# Patient Record
Sex: Male | Born: 1940 | Race: White | Hispanic: No | Marital: Married | Smoking: Former smoker
Health system: Southern US, Community
[De-identification: ages and names within clinical notes are randomized; demographics above are authoritative.]

## PROBLEM LIST (undated history)

## (undated) DIAGNOSIS — I2510 Atherosclerotic heart disease of native coronary artery without angina pectoris: Secondary | ICD-10-CM

## (undated) DIAGNOSIS — N183 Chronic kidney disease, stage 3 (moderate): Secondary | ICD-10-CM

## (undated) DIAGNOSIS — Z9289 Personal history of other medical treatment: Secondary | ICD-10-CM

## (undated) DIAGNOSIS — I700 Atherosclerosis of aorta: Secondary | ICD-10-CM

## (undated) DIAGNOSIS — I739 Peripheral vascular disease, unspecified: Secondary | ICD-10-CM

## (undated) DIAGNOSIS — I5030 Unspecified diastolic (congestive) heart failure: Secondary | ICD-10-CM

## (undated) DIAGNOSIS — K449 Diaphragmatic hernia without obstruction or gangrene: Secondary | ICD-10-CM

## (undated) DIAGNOSIS — Z8719 Personal history of other diseases of the digestive system: Secondary | ICD-10-CM

## (undated) DIAGNOSIS — K219 Gastro-esophageal reflux disease without esophagitis: Secondary | ICD-10-CM

## (undated) DIAGNOSIS — E118 Type 2 diabetes mellitus with unspecified complications: Secondary | ICD-10-CM

## (undated) DIAGNOSIS — E7800 Pure hypercholesterolemia, unspecified: Secondary | ICD-10-CM

## (undated) DIAGNOSIS — E669 Obesity, unspecified: Secondary | ICD-10-CM

## (undated) DIAGNOSIS — I10 Essential (primary) hypertension: Secondary | ICD-10-CM

## (undated) HISTORY — DX: Gastro-esophageal reflux disease without esophagitis: K21.9

## (undated) HISTORY — DX: Unspecified diastolic (congestive) heart failure: I50.30

## (undated) HISTORY — DX: Obesity, unspecified: E66.9

## (undated) HISTORY — DX: Personal history of other medical treatment: Z92.89

## (undated) HISTORY — DX: Personal history of other diseases of the digestive system: Z87.19

## (undated) HISTORY — DX: Atherosclerotic heart disease of native coronary artery without angina pectoris: I25.10

## (undated) HISTORY — DX: Type 2 diabetes mellitus with unspecified complications: E11.8

## (undated) HISTORY — DX: Chronic kidney disease, stage 3 (moderate): N18.3

## (undated) HISTORY — DX: Diaphragmatic hernia without obstruction or gangrene: K44.9

## (undated) HISTORY — DX: Peripheral vascular disease, unspecified: I73.9

## (undated) HISTORY — DX: Atherosclerosis of aorta: I70.0

## (undated) SURGERY — COLONOSCOPY
Anesthesia: Moderate Sedation

---

## 2001-07-30 ENCOUNTER — Emergency Department (HOSPITAL_COMMUNITY): Admission: EM | Admit: 2001-07-30 | Discharge: 2001-07-30 | Payer: Self-pay | Admitting: Emergency Medicine

## 2013-02-22 DIAGNOSIS — Z8719 Personal history of other diseases of the digestive system: Secondary | ICD-10-CM

## 2013-02-22 HISTORY — DX: Personal history of other diseases of the digestive system: Z87.19

## 2013-04-26 ENCOUNTER — Other Ambulatory Visit: Payer: Self-pay | Admitting: Family Medicine

## 2013-04-26 ENCOUNTER — Ambulatory Visit
Admission: RE | Admit: 2013-04-26 | Discharge: 2013-04-26 | Disposition: A | Discharge status: Home / Self Care | Payer: Medicare HMO | Source: Ambulatory Visit | Attending: Family Medicine | Admitting: Family Medicine

## 2013-04-26 DIAGNOSIS — M549 Dorsalgia, unspecified: Secondary | ICD-10-CM

## 2013-12-20 ENCOUNTER — Emergency Department (HOSPITAL_COMMUNITY): Payer: Medicare HMO

## 2013-12-20 ENCOUNTER — Encounter (HOSPITAL_COMMUNITY): Payer: Self-pay | Admitting: Emergency Medicine

## 2013-12-20 ENCOUNTER — Inpatient Hospital Stay (HOSPITAL_COMMUNITY)
Admission: EM | Admit: 2013-12-20 | Discharge: 2013-12-28 | DRG: 377 | Disposition: A | Discharge status: Home / Self Care | Payer: Medicare HMO | Attending: Internal Medicine | Admitting: Internal Medicine

## 2013-12-20 DIAGNOSIS — K264 Chronic or unspecified duodenal ulcer with hemorrhage: Principal | ICD-10-CM | POA: Diagnosis present

## 2013-12-20 DIAGNOSIS — R55 Syncope and collapse: Secondary | ICD-10-CM | POA: Diagnosis present

## 2013-12-20 DIAGNOSIS — R000 Tachycardia, unspecified: Secondary | ICD-10-CM | POA: Diagnosis present

## 2013-12-20 DIAGNOSIS — K625 Hemorrhage of anus and rectum: Secondary | ICD-10-CM

## 2013-12-20 DIAGNOSIS — N183 Chronic kidney disease, stage 3 (moderate): Secondary | ICD-10-CM | POA: Diagnosis present

## 2013-12-20 DIAGNOSIS — N179 Acute kidney failure, unspecified: Secondary | ICD-10-CM | POA: Diagnosis present

## 2013-12-20 DIAGNOSIS — I9589 Other hypotension: Secondary | ICD-10-CM

## 2013-12-20 DIAGNOSIS — I10 Essential (primary) hypertension: Secondary | ICD-10-CM | POA: Diagnosis present

## 2013-12-20 DIAGNOSIS — Z7982 Long term (current) use of aspirin: Secondary | ICD-10-CM | POA: Diagnosis not present

## 2013-12-20 DIAGNOSIS — K208 Other esophagitis: Secondary | ICD-10-CM | POA: Diagnosis present

## 2013-12-20 DIAGNOSIS — E872 Acidosis: Secondary | ICD-10-CM | POA: Diagnosis present

## 2013-12-20 DIAGNOSIS — Z8719 Personal history of other diseases of the digestive system: Secondary | ICD-10-CM | POA: Insufficient documentation

## 2013-12-20 DIAGNOSIS — I129 Hypertensive chronic kidney disease with stage 1 through stage 4 chronic kidney disease, or unspecified chronic kidney disease: Secondary | ICD-10-CM | POA: Diagnosis present

## 2013-12-20 DIAGNOSIS — K5730 Diverticulosis of large intestine without perforation or abscess without bleeding: Secondary | ICD-10-CM | POA: Diagnosis present

## 2013-12-20 DIAGNOSIS — R58 Hemorrhage, not elsewhere classified: Secondary | ICD-10-CM

## 2013-12-20 DIAGNOSIS — D62 Acute posthemorrhagic anemia: Secondary | ICD-10-CM | POA: Diagnosis present

## 2013-12-20 DIAGNOSIS — K922 Gastrointestinal hemorrhage, unspecified: Secondary | ICD-10-CM

## 2013-12-20 DIAGNOSIS — E785 Hyperlipidemia, unspecified: Secondary | ICD-10-CM | POA: Diagnosis present

## 2013-12-20 DIAGNOSIS — E780 Pure hypercholesterolemia: Secondary | ICD-10-CM | POA: Diagnosis present

## 2013-12-20 DIAGNOSIS — M109 Gout, unspecified: Secondary | ICD-10-CM | POA: Diagnosis present

## 2013-12-20 DIAGNOSIS — R571 Hypovolemic shock: Secondary | ICD-10-CM | POA: Diagnosis present

## 2013-12-20 DIAGNOSIS — N289 Disorder of kidney and ureter, unspecified: Secondary | ICD-10-CM

## 2013-12-20 DIAGNOSIS — E875 Hyperkalemia: Secondary | ICD-10-CM | POA: Diagnosis present

## 2013-12-20 DIAGNOSIS — J449 Chronic obstructive pulmonary disease, unspecified: Secondary | ICD-10-CM | POA: Diagnosis present

## 2013-12-20 DIAGNOSIS — K209 Esophagitis, unspecified: Secondary | ICD-10-CM | POA: Diagnosis present

## 2013-12-20 DIAGNOSIS — Z452 Encounter for adjustment and management of vascular access device: Secondary | ICD-10-CM

## 2013-12-20 HISTORY — DX: Essential (primary) hypertension: I10

## 2013-12-20 HISTORY — DX: Pure hypercholesterolemia, unspecified: E78.00

## 2013-12-20 LAB — I-STAT TROPONIN, ED: TROPONIN I, POC: 0.03 ng/mL (ref 0.00–0.08)

## 2013-12-20 LAB — COMPREHENSIVE METABOLIC PANEL
ALK PHOS: 34 U/L — AB (ref 39–117)
ALT: 21 U/L (ref 0–53)
ANION GAP: 20 — AB (ref 5–15)
AST: 24 U/L (ref 0–37)
Albumin: 2.8 g/dL — ABNORMAL LOW (ref 3.5–5.2)
BILIRUBIN TOTAL: 0.2 mg/dL — AB (ref 0.3–1.2)
BUN: 54 mg/dL — ABNORMAL HIGH (ref 6–23)
CHLORIDE: 103 meq/L (ref 96–112)
CO2: 16 mEq/L — ABNORMAL LOW (ref 19–32)
CREATININE: 2.36 mg/dL — AB (ref 0.50–1.35)
Calcium: 8.7 mg/dL (ref 8.4–10.5)
GFR calc Af Amer: 30 mL/min — ABNORMAL LOW (ref >90)
GFR calc non Af Amer: 26 mL/min — ABNORMAL LOW (ref >90)
Glucose, Bld: 157 mg/dL — ABNORMAL HIGH (ref 70–99)
POTASSIUM: 4.5 meq/L (ref 3.7–5.3)
Sodium: 139 mEq/L (ref 137–147)
Total Protein: 5.4 g/dL — ABNORMAL LOW (ref 6.0–8.3)

## 2013-12-20 LAB — POC OCCULT BLOOD, ED: Fecal Occult Bld: POSITIVE — AB

## 2013-12-20 LAB — CBC WITH DIFFERENTIAL/PLATELET
BASOS PCT: 0 % (ref 0–1)
Basophils Absolute: 0 10*3/uL (ref 0.0–0.1)
Eosinophils Absolute: 0 10*3/uL (ref 0.0–0.7)
Eosinophils Relative: 0 % (ref 0–5)
HCT: 30.7 % — ABNORMAL LOW (ref 39.0–52.0)
Hemoglobin: 10.3 g/dL — ABNORMAL LOW (ref 13.0–17.0)
LYMPHS ABS: 1 10*3/uL (ref 0.7–4.0)
Lymphocytes Relative: 13 % (ref 12–46)
MCH: 31.9 pg (ref 26.0–34.0)
MCHC: 33.6 g/dL (ref 30.0–36.0)
MCV: 95 fL (ref 78.0–100.0)
MONO ABS: 0.4 10*3/uL (ref 0.1–1.0)
MONOS PCT: 6 % (ref 3–12)
Neutro Abs: 6 10*3/uL (ref 1.7–7.7)
Neutrophils Relative %: 81 % — ABNORMAL HIGH (ref 43–77)
Platelets: 192 10*3/uL (ref 150–400)
RBC: 3.23 MIL/uL — ABNORMAL LOW (ref 4.22–5.81)
RDW: 14 % (ref 11.5–15.5)
WBC: 7.4 10*3/uL (ref 4.0–10.5)

## 2013-12-20 LAB — PREPARE RBC (CROSSMATCH)

## 2013-12-20 LAB — ABO/RH: ABO/RH(D): O NEG

## 2013-12-20 LAB — I-STAT CG4 LACTIC ACID, ED: Lactic Acid, Venous: 6.51 mmol/L — ABNORMAL HIGH (ref 0.5–2.2)

## 2013-12-20 LAB — TROPONIN I: Troponin I: <0.3 ng/mL (ref <0.30)

## 2013-12-20 MED ORDER — ONDANSETRON HCL 4 MG/2ML IJ SOLN
INTRAMUSCULAR | Status: AC
  Administered 2013-12-20: 4 mg via INTRAVENOUS
  Filled 2013-12-20: qty 2

## 2013-12-20 MED ORDER — SODIUM CHLORIDE 0.9 % IV SOLN: 250.0000 mL | INTRAVENOUS | Status: DC | PRN

## 2013-12-20 MED ORDER — ONDANSETRON HCL 4 MG/2ML IJ SOLN
4.0000 mg | Freq: Once | INTRAMUSCULAR | Status: AC
  Administered 2013-12-20: 4 mg via INTRAVENOUS

## 2013-12-20 MED ORDER — ONDANSETRON HCL 4 MG/2ML IJ SOLN
4.0000 mg | Freq: Four times a day (QID) | INTRAMUSCULAR | Status: DC | PRN
  Administered 2013-12-21: 4 mg via INTRAVENOUS
  Filled 2013-12-20: qty 2

## 2013-12-20 MED ORDER — SODIUM CHLORIDE 0.9 % IV SOLN
INTRAVENOUS | Status: DC
  Administered 2013-12-20: 100 mL/h via INTRAVENOUS

## 2013-12-20 MED ORDER — PANTOPRAZOLE SODIUM 40 MG IV SOLR
40.0000 mg | Freq: Every day | INTRAVENOUS | Status: DC
  Administered 2013-12-20: 40 mg via INTRAVENOUS
  Filled 2013-12-20 (×2): qty 40

## 2013-12-20 MED ORDER — SODIUM CHLORIDE 0.9 % IV BOLUS (SEPSIS)
1000.0000 mL | Freq: Once | INTRAVENOUS | Status: AC
  Administered 2013-12-20: 1000 mL via INTRAVENOUS

## 2013-12-20 MED ORDER — SODIUM CHLORIDE 0.9 % IV SOLN
Freq: Once | INTRAVENOUS | Status: AC
  Administered 2013-12-20: 23:00:00 via INTRAVENOUS

## 2013-12-20 NOTE — ED Notes (Signed)
Report received from Scott, RN

## 2013-12-20 NOTE — ED Notes (Signed)
Lab reports they do not have a blue top on the patient for coag's for the patient, Rahul, PA was made aware to be drawn after blood transfusion

## 2013-12-20 NOTE — ED Notes (Signed)
Patient states bloody stools x 3 today, patient diaphoretic and pale with sbp in 70's upon EMS arrival, patient received 500 ml LR per EMS, patient now dry and SBP in 110's.

## 2013-12-20 NOTE — ED Provider Notes (Signed)
CSN: [id number]     Arrival date & time 12/20/13  2101 History   First MD Initiated Contact with Patient 12/20/13 2104     Chief Complaint  Patient presents with  . Rectal Bleeding    Patient is a 73 y.o. male presenting with hematochezia. The history is provided by the patient, the EMS personnel and a relative.  Rectal Bleeding Quality:  Bright red Amount:  Copious Duration:  3 hours Timing:  Intermittent Progression:  Waxing and waning Chronicity:  New Context: spontaneously   Context: not constipation, not defecation, not diarrhea and not rectal pain   Similar prior episodes: no (no prior episodes)   Relieved by:  None tried Worsened by:  Nothing tried Ineffective treatments:  None tried Associated symptoms: abdominal pain, light-headedness, loss of consciousness and vomiting   Associated symptoms: no fever, no hematemesis and no recent illness   Abdominal pain:    Location:  Generalized   Quality:  Cramping   Severity:  Mild   Onset quality:  Gradual   Timing:  Intermittent   Progression:  Unchanged   Chronicity:  New Vomiting:    Quality:  Bright red blood   Number of occurrences:  3   Severity:  Severe   Duration:  3 hours   Timing:  Intermittent   Progression:  Partially resolved Risk factors: no anticoagulant use, no hx of colorectal cancer, no hx of colorectal surgery and no liver disease     Past Medical History  Diagnosis Date  . Hypertension   . Hypercholesteremia     History reviewed. No pertinent past surgical history.  No family history on file.  History  Substance Use Topics  . Smoking status: Never Smoker   . Smokeless tobacco: Not on file  . Alcohol Use: No    Review of Systems  Constitutional: Negative for fever.  Respiratory: Negative for shortness of breath.   Cardiovascular: Negative for chest pain.  Gastrointestinal: Positive for vomiting, abdominal pain and hematochezia. Negative for hematemesis.  Neurological: Positive for loss  of consciousness, syncope and light-headedness.  All other systems reviewed and are negative.   Allergies  Review of patient's allergies indicates not on file.  Home Medications   Prior to Admission medications   Medication Sig Start Date End Date Taking? Authorizing Provider  allopurinol (ZYLOPRIM) 100 MG tablet Take 200 mg by mouth every morning.   Yes Historical Provider, MD  aspirin EC 325 MG tablet Take 325 mg by mouth every evening.   Yes Historical Provider, MD  ezetimibe (ZETIA) 10 MG tablet Take 10 mg by mouth every evening.   Yes Historical Provider, MD  fenofibrate (TRICOR) 145 MG tablet Take 145 mg by mouth every evening.   Yes Historical Provider, MD  indomethacin (INDOCIN) 50 MG capsule Take 50 mg by mouth daily as needed (gout).   Yes Historical Provider, MD  losartan (COZAAR) 50 MG tablet Take 50 mg by mouth every morning.   Yes Historical Provider, MD  Multiple Vitamin (ONE-A-DAY MENS PO) Take 1 tablet by mouth every morning.   Yes Historical Provider, MD  niacin (NIASPAN) 1000 MG CR tablet Take 1,000 mg by mouth at bedtime.   Yes Historical Provider, MD  sulfamethoxazole-trimethoprim (BACTRIM DS) 800-160 MG per tablet Take 1 tablet by mouth 2 (two) times daily.    Historical Provider, MD    Vital Signs - Temp: 97.6 F (36.4 C) ; Temp Source: Oral ; ECG Heart Rate: 109 ; Resp: 22 ; BP: 110/69  mmHg ; BP Location: Right Arm ; BP Method: Automatic ; Patient Position (if appropriate): Lying  Oxygen Therapy - SpO2: 98 % ; O2 Device: Room Air  Height and Weight - Height: 5\' 9"  (175.3 cm) ; Height Method: Stated ; Weight: 221 lb (100.245 kg)  Physical Exam  Vitals reviewed. Constitutional: He is oriented to person, place, and time. He appears well-developed and well-nourished. No distress.  HENT:  Head: Normocephalic and atraumatic.  Right Ear: External ear normal.  Left Ear: External ear normal.  Eyes: EOM are normal. Pupils are equal, round, and reactive to light.   Neck: Normal range of motion.  Cardiovascular: Regular rhythm.  Tachycardia present.   Pulses:      Radial pulses are 2+ on the right side, and 2+ on the left side.  Pulmonary/Chest: Effort normal and breath sounds normal. No respiratory distress. He has no wheezes. He has no rales.  Abdominal: Soft. He exhibits no distension. There is no tenderness. There is no rebound and no guarding.  Neurological: He is alert and oriented to person, place, and time.  Skin: Skin is warm and dry. No rash noted. He is not diaphoretic. There is pallor.  Psychiatric: He has a normal mood and affect.    ED Course  Procedures  Labs Review  Results for orders placed during the hospital encounter of 12/20/13  CBC WITH DIFFERENTIAL      Result Value Ref Range   WBC 7.4  4.0 - 10.5 K/uL   RBC 3.23 (*) 4.22 - 5.81 MIL/uL   Hemoglobin 10.3 (*) 13.0 - 17.0 g/dL   HCT 30.830.7 (*) 65.739.0 - 84.652.0 %   MCV 95.0  78.0 - 100.0 fL   MCH 31.9  26.0 - 34.0 pg   MCHC 33.6  30.0 - 36.0 g/dL   RDW 96.214.0  95.211.5 - 84.115.5 %   Platelets 192  150 - 400 K/uL   Neutrophils Relative % 81 (*) 43 - 77 %   Neutro Abs 6.0  1.7 - 7.7 K/uL   Lymphocytes Relative 13  12 - 46 %   Lymphs Abs 1.0  0.7 - 4.0 K/uL   Monocytes Relative 6  3 - 12 %   Monocytes Absolute 0.4  0.1 - 1.0 K/uL   Eosinophils Relative 0  0 - 5 %   Eosinophils Absolute 0.0  0.0 - 0.7 K/uL   Basophils Relative 0  0 - 1 %   Basophils Absolute 0.0  0.0 - 0.1 K/uL  TROPONIN I      Result Value Ref Range   Troponin I <0.30  <0.30 ng/mL  COMPREHENSIVE METABOLIC PANEL      Result Value Ref Range   Sodium 139  137 - 147 mEq/L   Potassium 4.5  3.7 - 5.3 mEq/L   Chloride 103  96 - 112 mEq/L   CO2 16 (*) 19 - 32 mEq/L   Glucose, Bld 157 (*) 70 - 99 mg/dL   BUN 54 (*) 6 - 23 mg/dL   Creatinine, Ser 3.242.36 (*) 0.50 - 1.35 mg/dL   Calcium 8.7  8.4 - 40.110.5 mg/dL   Total Protein 5.4 (*) 6.0 - 8.3 g/dL   Albumin 2.8 (*) 3.5 - 5.2 g/dL   AST 24  0 - 37 U/L   ALT 21  0 - 53 U/L    Alkaline Phosphatase 34 (*) 39 - 117 U/L   Total Bilirubin 0.2 (*) 0.3 - 1.2 mg/dL   GFR calc non Af  Amer 26 (*) >90 mL/min   GFR calc Af Amer 30 (*) >90 mL/min   Anion gap 20 (*) 5 - 15  TROPONIN I      Result Value Ref Range   Troponin I <0.30  <0.30 ng/mL  POC OCCULT BLOOD, ED      Result Value Ref Range   Fecal Occult Bld POSITIVE (*) NEGATIVE  I-STAT CG4 LACTIC ACID, ED      Result Value Ref Range   Lactic Acid, Venous 6.51 (*) 0.5 - 2.2 mmol/L  I-STAT TROPOININ, ED      Result Value Ref Range   Troponin i, poc 0.03  0.00 - 0.08 ng/mL   Comment 3           TYPE AND SCREEN      Result Value Ref Range   ABO/RH(D) O NEG     Antibody Screen NEG     Sample Expiration 12/23/2013     Unit Number [id number]     Blood Component Type RBC LR PHER1     Unit division 00     Status of Unit ISSUED     Transfusion Status OK TO TRANSFUSE     Crossmatch Result Compatible     Unit Number [id number]     Blood Component Type RED CELLS,LR     Unit division 00     Status of Unit ALLOCATED     Transfusion Status OK TO TRANSFUSE     Crossmatch Result Compatible    PREPARE RBC (CROSSMATCH)      Result Value Ref Range   Order Confirmation ORDER PROCESSED BY BLOOD BANK    ABO/RH      Result Value Ref Range   ABO/RH(D) O NEG     Imaging Review Dg Chest Portable 1 View  12/21/2013   CLINICAL DATA:  Syncope.  EXAM: PORTABLE CHEST - 1 VIEW  COMPARISON:  None.  FINDINGS: Study is degraded by multiple overlapping wires and exclusion of the lateral left base.  No definitive cardiomegaly. Negative aortic contours. There is no edema, consolidation, effusion, or pneumothorax.  IMPRESSION: No evidence of acute disease.   Electronically Signed   By: Tiburcio Pea M.D.   On: 12/21/2013 00:31     MDM   Final diagnoses:  BRBPR (bright red blood per rectum)  Syncope and collapse  Syncope    73 y.o. male with a history of HTN, HLD, Gout presents due to BRBPR x 3 today beginning at 5pm.  Started feeling nauseated around 1pm. Had a syncopal episode after moving his bowels. Has had emesis, however does not feel there is blood in his emesis. Feels weak and lightheaded. Tachycardic. Maroon stool on digital rectal exam.   2 large IVs obtained. Concern for profound GI bleed. Type and screen sent. Labs obtained. Hgb 10.3. Ordered 2U PRBCs for him.   ICU contacted for admission given his elevated lactate, AKI, concern for continued bleeding, potential for decompensation. He had no episodes of BRBPR in the ED.   His hemodynamics remained acceptable in the ED - he was transported to the ICU.   This case managed in conjunction with my attending, Dr. Patria Mane.    Maxine Glenn, MD 12/21/13 [phone]

## 2013-12-20 NOTE — ED Notes (Signed)
Pt remains alert, NAD, calm, interactive, resps e/u, speaking in clear complete sentences, [facility], BP remains low, will continue to monitor, HR stable/ improving, HR 102, (denies: pain, sob, dizziness or light headedness).

## 2013-12-20 NOTE — H&P (Signed)
PULMONARY / CRITICAL CARE MEDICINE   Name: Drew [name] MRN: [id number] DOB: 07-09-1940    ADMISSION DATE:  12/20/2013 CONSULTATION DATE:  12/20/2013  REFERRING MD :  EDP  CHIEF COMPLAINT:  Lower GI Bleed  INITIAL PRESENTATION: 73 y.o. M [facility] after he had 3 episodes of hematochezia starting around 4pm same day.  In ED, pt had frank blood per rectum per EDP and pt was hypotensive with SBP in 70's - 80's.  PCCM consulted for admission.   STUDIES:  None  SIGNIFICANT EVENTS: 10/29 - admitted with LGIB   HISTORY OF PRESENT ILLNESS:  Drew [name] is a 73 y.o. M with PMH of HTN and HLD, who [facility] after he had 3 episodes of hematochezia at his home.  He apparently went to have a BM around 4pm earlier that afternoon and noticed that his stool had blood in it.  He initially thought that it was just red from the grape-cranberry juice that he drinks daily; however, he had another BM shortly thereafter which had more blood in it.  He notified his wife and his wife states he later had a 3rd BM that was pretty much all blood. EMS was dispatched and upon their arrival, SBP was in 70's and pt was pale and diaphoretic.  They administered 500cc bolus en route.  In ED, pt remained hypotensive.  Hgb was 10.3, pt was tachycardic and hypotensive with SBP in 70's - 80's still.  1 unit PRBC's was ordered and PCCM was consulted for admission.  Pt currently denies any chest pain, SOB, N/V, abdominal pain.  He denies any similar episodes in the past.  No history of melena, hematochezia, hematemesis.  He does not use NSAID's and is not on any anticoagulation. He does take 325mg  Aspirin daily.  Last colonoscopy was reportedly 2 years ago and per pt, was normal.    PAST MEDICAL HISTORY :   has a past medical history of Hypertension and Hypercholesteremia.  has no past surgical history on file. Prior to Admission medications   Medication Sig Start Date End Date Taking?  Authorizing Provider  allopurinol (ZYLOPRIM) 100 MG tablet Take 200 mg by mouth every morning.   Yes Historical Provider, MD  aspirin EC 325 MG tablet Take 325 mg by mouth every evening.   Yes Historical Provider, MD  ezetimibe (ZETIA) 10 MG tablet Take 10 mg by mouth every evening.   Yes Historical Provider, MD  fenofibrate (TRICOR) 145 MG tablet Take 145 mg by mouth every evening.   Yes Historical Provider, MD  indomethacin (INDOCIN) 50 MG capsule Take 50 mg by mouth daily as needed (gout).   Yes Historical Provider, MD  losartan (COZAAR) 50 MG tablet Take 50 mg by mouth every morning.   Yes Historical Provider, MD  Multiple Vitamin (ONE-A-DAY MENS PO) Take 1 tablet by mouth every morning.   Yes Historical Provider, MD  niacin (NIASPAN) 1000 MG CR tablet Take 1,000 mg by mouth at bedtime.   Yes Historical Provider, MD  sulfamethoxazole-trimethoprim (BACTRIM DS) 800-160 MG per tablet Take 1 tablet by mouth 2 (two) times daily.    Historical Provider, MD   No Known Allergies  FAMILY HISTORY:  No family history on file.  SOCIAL HISTORY:  reports that he has never smoked. He does not have any smokeless tobacco history on file. He reports that he does not drink alcohol or use illicit drugs.  REVIEW OF SYSTEMS:  All negative; except for those that are bolded, which indicate positives.  Constitutional: weight loss, weight gain, night sweats, fevers, chills, fatigue, weakness, diaphoresis. HEENT: headaches, sore throat, sneezing, nasal congestion, post nasal drip, difficulty swallowing, tooth/dental problems, visual complaints, visual changes, ear aches. Neuro: difficulty with speech, weakness, numbness, ataxia. CV:  chest pain, orthopnea, PND, swelling in lower extremities, dizziness, palpitations, syncope.  Resp: cough, hemoptysis, dyspnea, wheezing. GI  heartburn, indigestion, abdominal pain, nausea, vomiting, diarrhea, constipation, change in bowel habits, loss of appetite, hematemesis,  melena, hematochezia.  GU: dysuria, change in color of urine, urgency or frequency, flank pain, hematuria. MSK: joint pain or swelling, decreased range of motion. Psych: change in mood or affect, depression, anxiety, suicidal ideations, homicidal ideations. Skin: rash, itching, bruising.   SUBJECTIVE:  Uncomfortable, c/o dyspnea  VITAL SIGNS: Temp:  [97.6 F (36.4 C)-99.2 F (37.3 C)] 98.9 F (37.2 C) (10/29 2300) Pulse Rate:  [98-116] 105 (10/29 2334) Resp:  [15-24] 18 (10/29 2334) BP: (80-110)/(52-76) 80/53 mmHg (10/29 2334) SpO2:  [95 %-100 %] 96 % (10/29 2334) Weight:  [100.245 kg (221 lb)] 100.245 kg (221 lb) (10/29 2106) HEMODYNAMICS:   VENTILATOR SETTINGS:   INTAKE / OUTPUT: Intake/Output   None     PHYSICAL EXAMINATION: General: WDWN male, pale in color, in NAD. Neuro: A&O x 3, non-focal.  HEENT: [place]/AT. PERRL, sclerae anicteric. Cardiovascular: Tachy but regular, 2/6 SEM.  Lungs: Respirations even and unlabored.  CTA bilaterally, No W/R/R. Abdomen: BS x 4, soft, NT/ND.  Musculoskeletal: No gross deformities, no edema.  Skin: Intact, warm, no rashes.  LABS:  CBC  Recent Labs Lab 12/20/13 2105  WBC 7.4  HGB 10.3*  HCT 30.7*  PLT 192   Coag's No results found for this basename: APTT, INR,  in the last 168 hours BMET  Recent Labs Lab 12/20/13 2222  NA 139  K 4.5  CL 103  CO2 16*  BUN 54*  CREATININE 2.36*  GLUCOSE 157*   Electrolytes  Recent Labs Lab 12/20/13 2222  CALCIUM 8.7   Sepsis Markers  Recent Labs Lab 12/20/13 2139  LATICACIDVEN 6.51*   ABG No results found for this basename: PHART, PCO2ART, PO2ART,  in the last 168 hours Liver Enzymes  Recent Labs Lab 12/20/13 2222  AST 24  ALT 21  ALKPHOS 34*  BILITOT 0.2*  ALBUMIN 2.8*   Cardiac Enzymes  Recent Labs Lab 12/20/13 2105 12/20/13 2222  TROPONINI <0.30 <0.30   Glucose No results found for this basename: GLUCAP,  in the last 168 hours  Imaging No results  found.   ASSESSMENT / PLAN:  HEMATOLOGIC A:   Anemia - from acute blood loss secondary to LGIB, s/p 1 unit PRBC's in ED VTE Prophylaxis P:  H/H q6hrs x 3. Goal Hgb > 10 while actively bleeding. CVC in place Check coags. SCD's only.  GASTROINTESTINAL A:   LGIB GI prophylaxis Nutrition P:   Monitor for further episodes of hematochezia / melena. SUP: Pantoprazole. NPO  Appreciate Dr Marlane HatcherMagod's assistance. Planning for tagged red cell scan this am to localize bleeding source.   PULMONARY A: If LGIB persists and pt requires multiple transfusions, then at risk pulmonary edema / TRALI P:   Pulmonary hygiene. Follow CXR intermittently.  CARDIOVASCULAR A:  Hx HTN, HLD At risk demand ischemia P:  Monitor hemodynamics. Goal MAP > 65. Trend troponin / lactate. Hold outpatient aspirin, zetia, fenofibrate, losartan, niacin.  RENAL A:   AG metabolic acidosis - lactate AKI (SCr 2.36) - unknown  baseline SCr, suspect pre-renal secondary to hypovolemia from LGIB P:   NS @ 100. BMP in AM.  INFECTIOUS A:   No evidence of infection P:   Monitor clinically.  ENDOCRINE A:   No known issues   P:   Monitor glucose on BMP.  NEUROLOGIC A:   No acute issues P:   No interventions required.   Family updated: Wife and son-in law at bedside.  Interdisciplinary Family Meeting v Palliative Care Meeting:  Due by: 11/6   TODAY'S SUMMARY: 73 y.o. Admitted with LGIB.  S/p 1 unit PRBC in ED.  Q6hr H/H, transfuse for Hgb < 10 with active bleed.  Consult GI in AM.  Rutherford Guys, PA - [facility] Pgr: [phone]  or [phone] 12/20/2013, 11:40 PM  CC time: 45 minutes.  Attending Note:  I have examined pt and reviewed labs, notes, studies. I agree with the note above as amended. On my exam he is pale, uncomfortable, tachycardic but otherwise stable. He c/o dyspnea with activity and when he coughs. Hgb has dropped to 8.5, receiving  PRBC now. Plan is for localizing tagged RBC scan this am. Will follow and review plans with GI. He has not voided - will defer foley for now and follow. Independent CC time 45 minutes  Levy Pupa, MD, PhD 12/21/2013, 5:50 AM [facility] [phone] or if no answer [phone]

## 2013-12-20 NOTE — ED Notes (Signed)
Patient with grossly bloody stool per MD during rectal exam

## 2013-12-21 ENCOUNTER — Inpatient Hospital Stay (HOSPITAL_COMMUNITY): Payer: Medicare HMO

## 2013-12-21 ENCOUNTER — Encounter (HOSPITAL_COMMUNITY)
Admission: EM | Disposition: A | Discharge status: Home / Self Care | Payer: Medicare HMO | Source: Home / Self Care | Attending: Internal Medicine

## 2013-12-21 ENCOUNTER — Encounter (HOSPITAL_COMMUNITY): Payer: Self-pay | Admitting: *Deleted

## 2013-12-21 ENCOUNTER — Encounter (HOSPITAL_COMMUNITY)
Admission: EM | Disposition: A | Discharge status: Home / Self Care | Payer: Self-pay | Source: Home / Self Care | Attending: Internal Medicine

## 2013-12-21 DIAGNOSIS — D62 Acute posthemorrhagic anemia: Secondary | ICD-10-CM

## 2013-12-21 DIAGNOSIS — Z452 Encounter for adjustment and management of vascular access device: Secondary | ICD-10-CM

## 2013-12-21 DIAGNOSIS — K922 Gastrointestinal hemorrhage, unspecified: Secondary | ICD-10-CM

## 2013-12-21 DIAGNOSIS — I9589 Other hypotension: Secondary | ICD-10-CM

## 2013-12-21 HISTORY — PX: ENTEROSCOPY: SHX5533

## 2013-12-21 LAB — CBC
HEMATOCRIT: 28.9 % — AB (ref 39.0–52.0)
HEMATOCRIT: 24.5 % — AB (ref 39.0–52.0)
HEMOGLOBIN: 9.8 g/dL — AB (ref 13.0–17.0)
Hemoglobin: 8.5 g/dL — ABNORMAL LOW (ref 13.0–17.0)
MCH: 31.8 pg (ref 26.0–34.0)
MCH: 31.8 pg (ref 26.0–34.0)
MCHC: 33.9 g/dL (ref 30.0–36.0)
MCHC: 34.7 g/dL (ref 30.0–36.0)
MCV: 93.8 fL (ref 78.0–100.0)
MCV: 91.8 fL (ref 78.0–100.0)
PLATELETS: 171 10*3/uL (ref 150–400)
Platelets: 159 10*3/uL (ref 150–400)
RBC: 3.08 MIL/uL — ABNORMAL LOW (ref 4.22–5.81)
RBC: 2.67 MIL/uL — ABNORMAL LOW (ref 4.22–5.81)
RDW: 14 % (ref 11.5–15.5)
RDW: 14.1 % (ref 11.5–15.5)
WBC: 7.1 10*3/uL (ref 4.0–10.5)
WBC: 9.4 10*3/uL (ref 4.0–10.5)

## 2013-12-21 LAB — GLUCOSE, CAPILLARY
GLUCOSE-CAPILLARY: 163 mg/dL — AB (ref 70–99)
GLUCOSE-CAPILLARY: 176 mg/dL — AB (ref 70–99)
GLUCOSE-CAPILLARY: 163 mg/dL — AB (ref 70–99)
Glucose-Capillary: 148 mg/dL — ABNORMAL HIGH (ref 70–99)

## 2013-12-21 LAB — DIFFERENTIAL
Basophils Absolute: 0 10*3/uL (ref 0.0–0.1)
Basophils Relative: 0 % (ref 0–1)
EOS ABS: 0 10*3/uL (ref 0.0–0.7)
EOS PCT: 0 % (ref 0–5)
LYMPHS ABS: 1.5 10*3/uL (ref 0.7–4.0)
Lymphocytes Relative: 16 % (ref 12–46)
MONO ABS: 0.5 10*3/uL (ref 0.1–1.0)
Monocytes Relative: 5 % (ref 3–12)
Neutro Abs: 7.3 10*3/uL (ref 1.7–7.7)
Neutrophils Relative %: 79 % — ABNORMAL HIGH (ref 43–77)

## 2013-12-21 LAB — MRSA PCR SCREENING: MRSA by PCR: NEGATIVE

## 2013-12-21 LAB — BASIC METABOLIC PANEL
ANION GAP: 12 (ref 5–15)
Anion gap: 19 — ABNORMAL HIGH (ref 5–15)
Anion gap: 13 (ref 5–15)
BUN: 57 mg/dL — ABNORMAL HIGH (ref 6–23)
BUN: 67 mg/dL — ABNORMAL HIGH (ref 6–23)
BUN: 64 mg/dL — AB (ref 6–23)
CALCIUM: 8.3 mg/dL — AB (ref 8.4–10.5)
CO2: 14 meq/L — AB (ref 19–32)
CO2: 17 meq/L — AB (ref 19–32)
CO2: 16 meq/L — AB (ref 19–32)
CREATININE: 2.52 mg/dL — AB (ref 0.50–1.35)
CREATININE: 2.45 mg/dL — AB (ref 0.50–1.35)
Calcium: 8.3 mg/dL — ABNORMAL LOW (ref 8.4–10.5)
Calcium: 7.9 mg/dL — ABNORMAL LOW (ref 8.4–10.5)
Chloride: 102 mEq/L (ref 96–112)
Chloride: 109 mEq/L (ref 96–112)
Chloride: 111 mEq/L (ref 96–112)
Creatinine, Ser: 2.37 mg/dL — ABNORMAL HIGH (ref 0.50–1.35)
GFR calc Af Amer: 27 mL/min — ABNORMAL LOW (ref >90)
GFR calc Af Amer: 28 mL/min — ABNORMAL LOW (ref >90)
GFR calc Af Amer: 29 mL/min — ABNORMAL LOW (ref >90)
GFR calc non Af Amer: 25 mL/min — ABNORMAL LOW (ref >90)
GFR, EST NON AFRICAN AMERICAN: 24 mL/min — AB (ref >90)
GFR, EST NON AFRICAN AMERICAN: 24 mL/min — AB (ref >90)
GLUCOSE: 162 mg/dL — AB (ref 70–99)
GLUCOSE: 164 mg/dL — AB (ref 70–99)
Glucose, Bld: 162 mg/dL — ABNORMAL HIGH (ref 70–99)
Potassium: 5.3 mEq/L (ref 3.7–5.3)
Potassium: 5.9 mEq/L — ABNORMAL HIGH (ref 3.7–5.3)
Potassium: 5.1 mEq/L (ref 3.7–5.3)
SODIUM: 135 meq/L — AB (ref 137–147)
SODIUM: 138 meq/L (ref 137–147)
SODIUM: 140 meq/L (ref 137–147)

## 2013-12-21 LAB — CBC WITH DIFFERENTIAL/PLATELET
Basophils Absolute: 0 10*3/uL (ref 0.0–0.1)
Basophils Relative: 0 % (ref 0–1)
EOS ABS: 0 10*3/uL (ref 0.0–0.7)
EOS PCT: 0 % (ref 0–5)
HCT: 25.9 % — ABNORMAL LOW (ref 39.0–52.0)
Hemoglobin: 9 g/dL — ABNORMAL LOW (ref 13.0–17.0)
LYMPHS PCT: 15 % (ref 12–46)
Lymphs Abs: 1.5 10*3/uL (ref 0.7–4.0)
MCH: 32 pg (ref 26.0–34.0)
MCHC: 34.7 g/dL (ref 30.0–36.0)
MCV: 92.2 fL (ref 78.0–100.0)
MONO ABS: 0.7 10*3/uL (ref 0.1–1.0)
Monocytes Relative: 7 % (ref 3–12)
Neutro Abs: 7.8 10*3/uL — ABNORMAL HIGH (ref 1.7–7.7)
Neutrophils Relative %: 78 % — ABNORMAL HIGH (ref 43–77)
PLATELETS: 141 10*3/uL — AB (ref 150–400)
RBC: 2.81 MIL/uL — ABNORMAL LOW (ref 4.22–5.81)
RDW: 15.3 % (ref 11.5–15.5)
WBC: 10 10*3/uL (ref 4.0–10.5)

## 2013-12-21 LAB — PROTIME-INR
INR: 1.47 (ref 0.00–1.49)
Prothrombin Time: 18 seconds — ABNORMAL HIGH (ref 11.6–15.2)

## 2013-12-21 LAB — TROPONIN I
Troponin I: <0.3 ng/mL (ref <0.30)
Troponin I: <0.3 ng/mL (ref <0.30)

## 2013-12-21 LAB — HEMOGLOBIN AND HEMATOCRIT, BLOOD
HCT: 23.3 % — ABNORMAL LOW (ref 39.0–52.0)
HCT: 20.4 % — ABNORMAL LOW (ref 39.0–52.0)
HEMOGLOBIN: 7.1 g/dL — AB (ref 13.0–17.0)
Hemoglobin: 8.1 g/dL — ABNORMAL LOW (ref 13.0–17.0)

## 2013-12-21 LAB — MAGNESIUM
MAGNESIUM: 1.8 mg/dL (ref 1.5–2.5)
Magnesium: 1.9 mg/dL (ref 1.5–2.5)

## 2013-12-21 LAB — PREPARE RBC (CROSSMATCH)

## 2013-12-21 LAB — LACTIC ACID, PLASMA
LACTIC ACID, VENOUS: 4.1 mmol/L — AB (ref 0.5–2.2)
LACTIC ACID, VENOUS: 2.3 mmol/L — AB (ref 0.5–2.2)
LACTIC ACID, VENOUS: 2.5 mmol/L — AB (ref 0.5–2.2)

## 2013-12-21 LAB — APTT: aPTT: 31 seconds (ref 24–37)

## 2013-12-21 LAB — PHOSPHORUS
PHOSPHORUS: 2.8 mg/dL (ref 2.3–4.6)
PHOSPHORUS: 2.5 mg/dL (ref 2.3–4.6)

## 2013-12-21 SURGERY — ENTEROSCOPY
Anesthesia: Moderate Sedation

## 2013-12-21 MED ORDER — MIDAZOLAM HCL 10 MG/2ML IJ SOLN
INTRAMUSCULAR | Status: DC | PRN
  Administered 2013-12-21 (×2): 2 mg via INTRAVENOUS

## 2013-12-21 MED ORDER — FENTANYL CITRATE 0.05 MG/ML IJ SOLN
INTRAMUSCULAR | Status: DC | PRN
  Administered 2013-12-21 (×2): 25 ug via INTRAVENOUS

## 2013-12-21 MED ORDER — INSULIN ASPART 100 UNIT/ML ~~LOC~~ SOLN
10.0000 [IU] | Freq: Once | SUBCUTANEOUS | Status: AC
  Administered 2013-12-21: 10 [IU] via SUBCUTANEOUS

## 2013-12-21 MED ORDER — SODIUM CHLORIDE 0.9 % IV SOLN
Freq: Once | INTRAVENOUS | Status: AC
  Administered 2013-12-22: via INTRAVENOUS

## 2013-12-21 MED ORDER — TECHNETIUM TC 99M-LABELED RED BLOOD CELLS IV KIT: 25.0000 | PACK | Freq: Once | INTRAVENOUS | Status: AC | PRN

## 2013-12-21 MED ORDER — SODIUM CHLORIDE 0.9 % IV SOLN: Freq: Once | INTRAVENOUS | Status: DC

## 2013-12-21 MED ORDER — BUTAMBEN-TETRACAINE-BENZOCAINE 2-2-14 % EX AERO
INHALATION_SPRAY | CUTANEOUS | Status: DC | PRN
  Administered 2013-12-21: 2 via TOPICAL

## 2013-12-21 MED ORDER — SODIUM CHLORIDE 0.9 % IV SOLN
INTRAVENOUS | Status: DC
  Administered 2013-12-22: 11:00:00 via INTRAVENOUS

## 2013-12-21 MED ORDER — MIDAZOLAM HCL 5 MG/ML IJ SOLN
INTRAMUSCULAR | Status: AC
  Filled 2013-12-21: qty 2

## 2013-12-21 MED ORDER — SODIUM CHLORIDE 0.9 % IV SOLN
8.0000 mg/h | INTRAVENOUS | Status: DC
  Administered 2013-12-21 – 2013-12-22 (×3): 8 mg/h via INTRAVENOUS
  Filled 2013-12-21 (×7): qty 80

## 2013-12-21 MED ORDER — SODIUM CHLORIDE 0.9 % IV SOLN
1.0000 g | Freq: Once | INTRAVENOUS | Status: AC
  Administered 2013-12-21: 1 g via INTRAVENOUS
  Filled 2013-12-21: qty 10

## 2013-12-21 MED ORDER — SODIUM CHLORIDE 0.9 % IV SOLN
80.0000 mg | Freq: Once | INTRAVENOUS | Status: AC
  Administered 2013-12-21: 80 mg via INTRAVENOUS
  Filled 2013-12-21: qty 80

## 2013-12-21 MED ORDER — SODIUM POLYSTYRENE SULFONATE 15 GM/60ML PO SUSP
15.0000 g | Freq: Once | ORAL | Status: AC
  Administered 2013-12-21: 15 g via ORAL
  Filled 2013-12-21: qty 60

## 2013-12-21 MED ORDER — VECURONIUM BROMIDE 10 MG IV SOLR: 6.0000 mg | Freq: Once | INTRAVENOUS | Status: DC

## 2013-12-21 MED ORDER — DEXTROSE 50 % IV SOLN
50.0000 mL | Freq: Once | INTRAVENOUS | Status: AC
  Administered 2013-12-21: 50 mL via INTRAVENOUS
  Filled 2013-12-21: qty 50

## 2013-12-21 MED ORDER — FENTANYL CITRATE 0.05 MG/ML IJ SOLN
INTRAMUSCULAR | Status: AC
  Filled 2013-12-21: qty 2

## 2013-12-21 MED ORDER — SODIUM CHLORIDE 0.9 % IV BOLUS (SEPSIS)
500.0000 mL | Freq: Once | INTRAVENOUS | Status: AC
  Administered 2013-12-21: 500 mL via INTRAVENOUS

## 2013-12-21 NOTE — Interval H&P Note (Signed)
History and Physical Interval Note:  12/21/2013 3:55 PM  Drew [name]  has presented today for surgery, with the diagnosis of bleed in jejunum  The various methods of treatment have been discussed with the patient and family. After consideration of risks, benefits and other options for treatment, the patient has consented to  Procedure(s): ENTEROSCOPY (N/A) as a surgical intervention .  The patient's history has been reviewed, patient examined, no change in status, stable for surgery.  I have reviewed the patient's chart and labs.  Questions were answered to the patient's satisfaction.     Sharan Mcenaney JR,Skye Plamondon L

## 2013-12-21 NOTE — Progress Notes (Addendum)
Pt. SBP was in the 50's-60's pt. Feel sick and pale looking, clammy and diaphoretic. Very short of breath  When repositioned and with movement, can not tolerate flat in bed. Placed in slight trendelenberg position . Dr. Marchelle Gearingramaswamy notified with orders made. NS 500cc bolus IV given, SBP >90's-100's. Pt. Felt better after bolus. Will continue to monitor.

## 2013-12-21 NOTE — Progress Notes (Signed)
Nutrition Brief Note  Patient identified on the Malnutrition Screening Tool (MST) Report for weight loss. Patient reports that he has lost 2 lbs. He has not eaten for 2 days, but prior to that he was eating very well and weight was stable.  Wt Readings from Last 15 Encounters:  12/21/13 215 lb 9.8 oz (97.8 kg)    Body mass index is 31.83 kg/(m^2). 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   Current diet order is NPO for a procedure. Labs and medications reviewed.   No nutrition interventions warranted at this time. If nutrition issues arise, please consult RD.   Joaquin CourtsKimberly Khaleah Duer, RD, LDN, CNSC Pager [phone] After Hours Pager [phone]

## 2013-12-21 NOTE — H&P (Signed)
PULMONARY / CRITICAL CARE MEDICINE   Name: Drew [name] MRN: [id number] DOB: December 15, 1940    ADMISSION DATE:  12/20/2013 CONSULTATION DATE:  12/21/2013  REFERRING MD :  EDP  CHIEF COMPLAINT:  Lower GI Bleed  INITIAL PRESENTATION: 73 y.o. M [facility] on 10/29 after he had 3 episodes of hematochezia starting around 4pm same day.  In ED, pt had frank blood per rectum per EDP and pt was hypotensive with SBP in 70's - 80's.  PCCM consulted for admission.   STUDIES:  10/30 tagged red blood cell>>>pos jejunal?  SIGNIFICANT EVENTS: 10/29 - admitted with LGIB 10/30 - borderline BP, tag scan positive  SUBJECTIVE:  No distress, borderline BP  VITAL SIGNS: Temp:  [97.6 F (36.4 C)-99.5 F (37.5 C)] 97.9 F (36.6 C) (10/30 1228) Pulse Rate:  [98-116] 102 (10/30 0920) Resp:  [14-30] 22 (10/30 0920) BP: (55-144)/(45-102) 114/81 mmHg (10/30 0920) SpO2:  [95 %-100 %] 98 % (10/30 0900) Weight:  [97.8 kg (215 lb 9.8 oz)-100.245 kg (221 lb)] 97.8 kg (215 lb 9.8 oz) (10/30 0020) HEMODYNAMICS:   VENTILATOR SETTINGS:   INTAKE / OUTPUT: Intake/Output     10/29 0701 - 10/30 0700 10/30 0701 - 10/31 0700   I.V. (mL/kg) 950 (9.7) 200 (2)   Blood 365    IV Piggyback 500    Total Intake(mL/kg) 1815 (18.6) 200 (2)   Stool 1    Total Output 1     Net +1814 +200        Stool Occurrence 1 x      PHYSICAL EXAMINATION: General:  pale in color Neuro: A&O x 3, non-focal.  HEENT: jvd down Cardiovascular: Tachy but regular, 2/6 SEM.  Lungs: CTA Abdomen: BS x 4, soft, ND, mild ruq pain Musculoskeletal: No gross deformities, no edema.  Skin: Intact, warm, no rashes.  LABS:  CBC  Recent Labs Lab 12/21/13 0115 12/21/13 0330 12/21/13 1025  WBC 7.1 9.4 10.0  HGB 9.8* 8.5* 9.0*  HCT 28.9* 24.5* 25.9*  PLT 159 171 141*   Coag's  Recent Labs Lab 12/21/13 0115  INR 1.47   BMET  Recent Labs Lab 12/20/13 2222 12/21/13 0115  NA 139 135*  K 4.5 5.3  CL 103 102  CO2 16* 14*   BUN 54* 57*  CREATININE 2.36* 2.52*  GLUCOSE 157* 162*   Electrolytes  Recent Labs Lab 12/20/13 2222 12/21/13 0115 12/21/13 0900  CALCIUM 8.7 8.3*  --   MG  --  1.8 1.9  PHOS  --  2.8 2.5   Sepsis Markers  Recent Labs Lab 12/20/13 2139 12/21/13 0115 12/21/13 0900  LATICACIDVEN 6.51* 4.1* 2.3*   ABG No results found for this basename: PHART, PCO2ART, PO2ART,  in the last 168 hours Liver Enzymes  Recent Labs Lab 12/20/13 2222  AST 24  ALT 21  ALKPHOS 34*  BILITOT 0.2*  ALBUMIN 2.8*   Cardiac Enzymes  Recent Labs Lab 12/20/13 2222 12/21/13 0115 12/21/13 0900  TROPONINI <0.30 <0.30 <0.30   Glucose  Recent Labs Lab 12/21/13 0104 12/21/13 1227  GLUCAP 163* 176*    Imaging Dg Chest Portable 1 View  12/21/2013   CLINICAL DATA:  Syncope.  EXAM: PORTABLE CHEST - 1 VIEW  COMPARISON:  None.  FINDINGS: Study is degraded by multiple overlapping wires and exclusion of the lateral left base.  No definitive cardiomegaly. Negative aortic contours. There is no edema, consolidation, effusion, or pneumothorax.  IMPRESSION: No evidence of acute disease.   Electronically  Signed   By: Tiburcio PeaJonathan  Watts M.D.   On: 12/21/2013 00:31     ASSESSMENT / PLAN:  HEMATOLOGIC A:   Anemia - from acute blood loss secondary to LGIB, s/p 1 unit PRBC's in ED VTE Prophylaxis P:  H/H q6hrs x 3. CVC in place, see cvs Check coags, add ptt SCD's only.  GASTROINTESTINAL A:   LGIB?? Tag scan pos jejunal GI prophylaxis Nutrition - npo P:   Monitor for further episodes of hematochezia / melena. SUP: Pantoprazole. drip NPO  Tag scan pos, have notified GI consider endoscopy as far as can go Planned for Saturday , if hgb drops would recommend move forward today Ensure active type and screen Would we consider angio if volume increases consider NGT placement with tag scan location - will d/w GI  PULMONARY A: If LGIB persists and pt requires multiple transfusions, then at risk  pulmonary edema / TRALI P:   Pulmonary hygiene. Follow CXR intermittently. For atx  CARDIOVASCULAR A:  Hx HTN, HLD At risk demand ischemia Borderline BP Active bleeding P:  Monitor hemodynamics. Goal MAP > 60 Trend troponin re asuring Hold outpatient aspirin, zetia, fenofibrate, losartan, niacin. Assess cvp If map less 60 add levophed  RENAL A:   AG metabolic acidosis - lactate AKI (SCr 2.36) - unknown baseline SCr, suspect pre-renal secondary to hypovolemia from LGIB P:   NS to reduce kvo resuscitate with products if needed BMP to q12h , follow K with blood and renal INsuff  INFECTIOUS A:   No evidence of infection P:   Monitor clinically.  ENDOCRINE A:   No known issues   P:   Monitor glucose on BMP.  NEUROLOGIC A:   No acute issues P:   No interventions required.   Family updated: Wife and son-in law at bedside 10/29  Interdisciplinary Family Meeting v Palliative Care Meeting:  Due by: 11/6   TODAY'S SUMMARY: tag scan pos, consider endo today, cbc q6h, ppi drip, cvp needed, may need levo  CC time: 30 min   Mcarthur Rossettianiel J. Tyson AliasFeinstein, MD, FACP Pgr: [phone] [facility]

## 2013-12-21 NOTE — Progress Notes (Signed)
[facility]  incoming shift in nuclear med.

## 2013-12-21 NOTE — ED Provider Notes (Signed)
I saw and evaluated the patient, reviewed the resident's note and I agree with the findings and plan.   EKG Interpretation   Date/Time:  Thursday December 20 2013 21:09:11 EDT Ventricular Rate:  109 PR Interval:  159 QRS Duration: 79 QT Interval:  290 QTC Calculation: 390 R Axis:   12 Text Interpretation:  Sinus tachycardia Ventricular premature complex  Aberrant conduction of SV complex(es) Low voltage, extremity and  precordial leads No old tracing to compare Confirmed by Ariana Juul  MD, Caryn BeeKEVIN  ([id number]) on 12/21/2013 1:11:14 AM      CRITICAL CARE Performed by: Lyanne CoAMPOS,Caylyn Tedeschi M Total critical care time: 35 Critical care time was exclusive of separately billable procedures and treating other patients. Critical care was necessary to treat or prevent imminent or life-threatening deterioration. Critical care was time spent personally by me on the following activities: development of treatment plan with patient and/or surrogate as well as nursing, discussions with consultants, evaluation of patient's response to treatment, examination of patient, obtaining history from patient or surrogate, ordering and performing treatments and interventions, ordering and review of laboratory studies, ordering and review of radiographic studies, pulse oximetry and re-evaluation of patient's condition.  Patient with large volume lower GI bleed.  Patient with mild tachycardia and hypotension.  He is pale.  He will be transfused blood at this time.  Admission to the intensive care unit.  I continue to check on the patient several times.  Initially his blood pressure rose.  His blood pressures back in the 80s.  He has had no more bloody bowel movements since being here.  This appears to be a lower GI bleed.  No use of anticoagulants.  We'll continue to monitor the patient closely while in the emergency department.  He is awaiting his bed in the intensive care unit at this time.  Blood transfusion is initiated  Lyanne CoKevin M  Taelar Gronewold, MD 12/21/13 [phone]

## 2013-12-21 NOTE — Progress Notes (Signed)
Pt. Was brought down to nuclear med for study, family was updated this morning about patient status. Stayed with the patient and continued to monitor, still with ongoing blood transfusion.

## 2013-12-21 NOTE — Progress Notes (Signed)
No urine output since admitted to the floor. Bladder scan shows >300 of urine, pt. Denies any discomfort bladder not distended . Dr. Delton CoombesByrum in to check pt. aware of no urine output. Will continue to monitor.

## 2013-12-21 NOTE — Consult Note (Signed)
[facility] CON[facility] Reason for consult: G.I. bleeding Referring Physician: CCM. PCP: Dr. Lisbeth Ply. Primary G.I.: Drew [name] is an 73 y.o. male.  HPI: he came to the emergency room after 3 episodes of hematochezia that started yesterday afternoon. He had multiple bloody stools was hypotensive in the emergency room with systolic pressure around 80. He has received transfusion. He had no preceding melena. During the night his stools have become somewhat dark. He denies abdominal pain, history of ulcers, use of NSAIDs. Colonoscopy 2012 reveal small adenomatous polyp and multiple diverticula in the descending and sigmoid colon. BUN and creatinine were both slightly elevated. Hemoglobin has dropped from 9.8 on admission to 8.5 despite transfusion. Patient denies preceding constipation.  Past Medical History  Diagnosis Date  . Hypertension   . Hypercholesteremia     History reviewed. No pertinent past surgical history.  History reviewed. No pertinent family history.  Social History:  reports that he has never smoked. He does not have any smokeless tobacco history on file. He reports that he does not drink alcohol or use illicit drugs.  Allergies: No Known Allergies  Medications; Prior to Admission medications   Medication Sig Start Date End Date Taking? Authorizing Provider  allopurinol (ZYLOPRIM) 100 MG tablet Take 200 mg by mouth every morning.   Yes Historical Provider, MD  aspirin EC 325 MG tablet Take 325 mg by mouth every evening.   Yes Historical Provider, MD  ezetimibe (ZETIA) 10 MG tablet Take 10 mg by mouth every evening.   Yes Historical Provider, MD  fenofibrate (TRICOR) 145 MG tablet Take 145 mg by mouth every evening.   Yes Historical Provider, MD  indomethacin (INDOCIN) 50 MG capsule Take 50 mg by mouth daily as needed (gout).   Yes Historical Provider, MD  losartan (COZAAR) 50 MG tablet Take 50 mg by mouth every morning.   Yes Historical Provider, MD   Multiple Vitamin (ONE-A-DAY MENS PO) Take 1 tablet by mouth every morning.   Yes Historical Provider, MD  niacin (NIASPAN) 1000 MG CR tablet Take 1,000 mg by mouth at bedtime.   Yes Historical Provider, MD  sulfamethoxazole-trimethoprim (BACTRIM DS) 800-160 MG per tablet Take 1 tablet by mouth 2 (two) times daily.    Historical Provider, MD   . sodium chloride   Intravenous Once  . pantoprazole (PROTONIX) IV  40 mg Intravenous QHS   PRN Meds sodium chloride, ondansetron (ZOFRAN) IV Results for orders placed during the hospital encounter of 12/20/13 (from the past 48 hour(s))  TYPE AND SCREEN     Status: None   Collection Time    12/20/13  9:05 PM      Result Value Ref Range   ABO/RH(D) O NEG     Antibody Screen NEG     Sample Expiration 12/23/2013     Unit Number [id number]     Blood Component Type RBC LR PHER1     Unit division 00     Status of Unit ISSUED     Transfusion Status OK TO TRANSFUSE     Crossmatch Result Compatible     Unit Number [id number]     Blood Component Type RED CELLS,LR     Unit division 00     Status of Unit ISSUED     Transfusion Status OK TO TRANSFUSE     Crossmatch Result Compatible     Unit Number [id number]     Blood Component Type RED CELLS,LR     Unit division 00  Status of Unit ALLOCATED     Unit tag comment VERBAL ORDERS PER DR LI     Transfusion Status OK TO TRANSFUSE     Crossmatch Result COMPATIBLE     Unit Number [id number]     Blood Component Type RED CELLS,LR     Unit division 00     Status of Unit ALLOCATED     Unit tag comment VERBAL ORDERS PER DR LI     Transfusion Status OK TO TRANSFUSE     Crossmatch Result COMPATIBLE    CBC WITH DIFFERENTIAL     Status: Abnormal   Collection Time    12/20/13  9:05 PM      Result Value Ref Range   WBC 7.4  4.0 - 10.5 K/uL   RBC 3.23 (*) 4.22 - 5.81 MIL/uL   Hemoglobin 10.3 (*) 13.0 - 17.0 g/dL   HCT 30.7 (*) 39.0 - 52.0 %   MCV 95.0  78.0 - 100.0 fL   MCH 31.9  26.0 - 34.0  pg   MCHC 33.6  30.0 - 36.0 g/dL   RDW 14.0  11.5 - 15.5 %   Platelets 192  150 - 400 K/uL   Neutrophils Relative % 81 (*) 43 - 77 %   Neutro Abs 6.0  1.7 - 7.7 K/uL   Lymphocytes Relative 13  12 - 46 %   Lymphs Abs 1.0  0.7 - 4.0 K/uL   Monocytes Relative 6  3 - 12 %   Monocytes Absolute 0.4  0.1 - 1.0 K/uL   Eosinophils Relative 0  0 - 5 %   Eosinophils Absolute 0.0  0.0 - 0.7 K/uL   Basophils Relative 0  0 - 1 %   Basophils Absolute 0.0  0.0 - 0.1 K/uL  TROPONIN I     Status: None   Collection Time    12/20/13  9:05 PM      Result Value Ref Range   Troponin I <0.30  <0.30 ng/mL   Comment:            Due to the release kinetics of cTnI,     a negative result within the first hours     of the onset of symptoms does not rule out     myocardial infarction with certainty.     If myocardial infarction is still suspected,     repeat the test at appropriate intervals.  ABO/RH     Status: None   Collection Time    12/20/13  9:05 PM      Result Value Ref Range   ABO/RH(D) O NEG    POC OCCULT BLOOD, ED     Status: Abnormal   Collection Time    12/20/13  9:26 PM      Result Value Ref Range   Fecal Occult Bld POSITIVE (*) NEGATIVE  PREPARE RBC (CROSSMATCH)     Status: None   Collection Time    12/20/13  9:30 PM      Result Value Ref Range   Order Confirmation ORDER PROCESSED BY BLOOD BANK    I-STAT TROPOININ, ED     Status: None   Collection Time    12/20/13  9:36 PM      Result Value Ref Range   Troponin i, poc 0.03  0.00 - 0.08 ng/mL   Comment 3            Comment: Due to the release kinetics of cTnI,     a  negative result within the first hours     of the onset of symptoms does not rule out     myocardial infarction with certainty.     If myocardial infarction is still suspected,     repeat the test at appropriate intervals.  I-STAT CG4 LACTIC ACID, ED     Status: Abnormal   Collection Time    12/20/13  9:39 PM      Result Value Ref Range   Lactic Acid, Venous 6.51  (*) 0.5 - 2.2 mmol/L  COMPREHENSIVE METABOLIC PANEL     Status: Abnormal   Collection Time    12/20/13 10:22 PM      Result Value Ref Range   Sodium 139  137 - 147 mEq/L   Potassium 4.5  3.7 - 5.3 mEq/L   Chloride 103  96 - 112 mEq/L   CO2 16 (*) 19 - 32 mEq/L   Glucose, Bld 157 (*) 70 - 99 mg/dL   BUN 54 (*) 6 - 23 mg/dL   Creatinine, Ser 2.36 (*) 0.50 - 1.35 mg/dL   Calcium 8.7  8.4 - 10.5 mg/dL   Total Protein 5.4 (*) 6.0 - 8.3 g/dL   Albumin 2.8 (*) 3.5 - 5.2 g/dL   AST 24  0 - 37 U/L   ALT 21  0 - 53 U/L   Alkaline Phosphatase 34 (*) 39 - 117 U/L   Total Bilirubin 0.2 (*) 0.3 - 1.2 mg/dL   GFR calc non Af Amer 26 (*) >90 mL/min   GFR calc Af Amer 30 (*) >90 mL/min   Comment: (NOTE)     The eGFR has been calculated using the CKD EPI equation.     This calculation has not been validated in all clinical situations.     eGFR's persistently <90 mL/min signify possible Chronic Kidney     Disease.   Anion gap 20 (*) 5 - 15  TROPONIN I     Status: None   Collection Time    12/20/13 10:22 PM      Result Value Ref Range   Troponin I <0.30  <0.30 ng/mL   Comment:            Due to the release kinetics of cTnI,     a negative result within the first hours     of the onset of symptoms does not rule out     myocardial infarction with certainty.     If myocardial infarction is still suspected,     repeat the test at appropriate intervals.  MRSA PCR SCREENING     Status: None   Collection Time    12/21/13 12:21 AM      Result Value Ref Range   MRSA by PCR NEGATIVE  NEGATIVE   Comment:            The GeneXpert MRSA Assay (FDA     approved for NASAL specimens     only), is one component of a     comprehensive MRSA colonization     surveillance program. It is not     intended to diagnose MRSA     infection nor to guide or     monitor treatment for     MRSA infections.  GLUCOSE, CAPILLARY     Status: Abnormal   Collection Time    12/21/13  1:04 AM      Result Value Ref Range    Glucose-Capillary 163 (*) 70 - 99 mg/dL  PROTIME-INR  Status: Abnormal   Collection Time    12/21/13  1:15 AM      Result Value Ref Range   Prothrombin Time 18.0 (*) 11.6 - 15.2 seconds   INR 1.47  0.00 - 1.49  TROPONIN I     Status: None   Collection Time    12/21/13  1:15 AM      Result Value Ref Range   Troponin I <0.30  <0.30 ng/mL   Comment:            Due to the release kinetics of cTnI,     a negative result within the first hours     of the onset of symptoms does not rule out     myocardial infarction with certainty.     If myocardial infarction is still suspected,     repeat the test at appropriate intervals.  LACTIC ACID, PLASMA     Status: Abnormal   Collection Time    12/21/13  1:15 AM      Result Value Ref Range   Lactic Acid, Venous 4.1 (*) 0.5 - 2.2 mmol/L  MAGNESIUM     Status: None   Collection Time    12/21/13  1:15 AM      Result Value Ref Range   Magnesium 1.8  1.5 - 2.5 mg/dL  PHOSPHORUS     Status: None   Collection Time    12/21/13  1:15 AM      Result Value Ref Range   Phosphorus 2.8  2.3 - 4.6 mg/dL  CBC     Status: Abnormal   Collection Time    12/21/13  1:15 AM      Result Value Ref Range   WBC 7.1  4.0 - 10.5 K/uL   RBC 3.08 (*) 4.22 - 5.81 MIL/uL   Hemoglobin 9.8 (*) 13.0 - 17.0 g/dL   HCT 28.9 (*) 39.0 - 52.0 %   MCV 93.8  78.0 - 100.0 fL   MCH 31.8  26.0 - 34.0 pg   MCHC 33.9  30.0 - 36.0 g/dL   RDW 14.0  11.5 - 15.5 %   Platelets 159  150 - 400 K/uL  BASIC METABOLIC PANEL     Status: Abnormal   Collection Time    12/21/13  1:15 AM      Result Value Ref Range   Sodium 135 (*) 137 - 147 mEq/L   Potassium 5.3  3.7 - 5.3 mEq/L   Chloride 102  96 - 112 mEq/L   CO2 14 (*) 19 - 32 mEq/L   Glucose, Bld 162 (*) 70 - 99 mg/dL   BUN 57 (*) 6 - 23 mg/dL   Creatinine, Ser 2.52 (*) 0.50 - 1.35 mg/dL   Calcium 8.3 (*) 8.4 - 10.5 mg/dL   GFR calc non Af Amer 24 (*) >90 mL/min   GFR calc Af Amer 27 (*) >90 mL/min   Comment: (NOTE)      The eGFR has been calculated using the CKD EPI equation.     This calculation has not been validated in all clinical situations.     eGFR's persistently <90 mL/min signify possible Chronic Kidney     Disease.   Anion gap 19 (*) 5 - 15  CBC     Status: Abnormal   Collection Time    12/21/13  3:30 AM      Result Value Ref Range   WBC 9.4  4.0 - 10.5 K/uL   RBC 2.67 (*)  4.22 - 5.81 MIL/uL   Hemoglobin 8.5 (*) 13.0 - 17.0 g/dL   HCT 24.5 (*) 39.0 - 52.0 %   MCV 91.8  78.0 - 100.0 fL   MCH 31.8  26.0 - 34.0 pg   MCHC 34.7  30.0 - 36.0 g/dL   RDW 14.1  11.5 - 15.5 %   Platelets 171  150 - 400 K/uL  PREPARE RBC (CROSSMATCH)     Status: None   Collection Time    12/21/13  3:30 AM      Result Value Ref Range   Order Confirmation ORDER PROCESSED BY BLOOD BANK    DIFFERENTIAL     Status: Abnormal   Collection Time    12/21/13  3:30 AM      Result Value Ref Range   Neutrophils Relative % 79 (*) 43 - 77 %   Neutro Abs 7.3  1.7 - 7.7 K/uL   Lymphocytes Relative 16  12 - 46 %   Lymphs Abs 1.5  0.7 - 4.0 K/uL   Monocytes Relative 5  3 - 12 %   Monocytes Absolute 0.5  0.1 - 1.0 K/uL   Eosinophils Relative 0  0 - 5 %   Eosinophils Absolute 0.0  0.0 - 0.7 K/uL   Basophils Relative 0  0 - 1 %   Basophils Absolute 0.0  0.0 - 0.1 K/uL    Dg Chest Port 1 View  12/21/2013   CLINICAL DATA:  Central line placement  EXAM: PORTABLE CHEST - 1 VIEW  COMPARISON:  12/20/2013  FINDINGS: Right IJ catheter tip projects over the cavoatrial junction. Otherwise, no significant interval change. Hypoaeration with interstitial and vascular crowding. Mild linear left lung base opacity, favor atelectasis. Small effusions not excluded. No pneumothorax. Mild aortic tortuosity. Heart size upper normal to mildly enlarged. No acute osseous finding.  IMPRESSION: Right IJ catheter tip projects over the cavoatrial junction. No pneumothorax.  Hypoaeration and mild left lung base opacity, favor atelectasis.   Electronically  Signed   By: Carlos Levering M.D.   On: 12/21/2013 05:22   Dg Chest Portable 1 View  12/21/2013   CLINICAL DATA:  Syncope.  EXAM: PORTABLE CHEST - 1 VIEW  COMPARISON:  None.  FINDINGS: Study is degraded by multiple overlapping wires and exclusion of the lateral left base.  No definitive cardiomegaly. Negative aortic contours. There is no edema, consolidation, effusion, or pneumothorax.  IMPRESSION: No evidence of acute disease.   Electronically Signed   By: Jorje Guild M.D.   On: 12/21/2013 00:31               Blood pressure 91/50, pulse 102, temperature 97.7 F (36.5 C), temperature source Oral, resp. rate 28, height 5' 9"  (1.753 m), weight 97.8 kg (215 lb 9.8 oz), SpO2 96.00%.  Physical exam:   General--pleasant white male alert and oriented in no acute distress Heart-- somewhat tachycardic Lungs--clear Abdomen-- soft completely nontender Assessment: 1. G.I. Bleed. This is probably a diverticular bleed based on the history, hypotension etc. I agree with going ahead and treating them with PPI therapy empirically in getting the G.I. bleeding scan. He does have extensive diverticulosis on previous colonoscopy  Plan: we will follow with you and check on the results of the G.I. bleeding scan   Iwao Shamblin JR,Dedrick Heffner L 12/21/2013, 7:13 AM

## 2013-12-21 NOTE — Progress Notes (Signed)
PCCM Interval Progress Note  Called by Pola CornELINK MD and asked to assess pt at bedside for diaphoresis and transient profound hypotension (SBP 55).  After 500cc NS bolus, BP responded and SBP now 115.  HR 105.  BP 111/55  Pulse 108  Temp(Src) 97.7 F (36.5 C) (Oral)  Resp 23  Ht 5\' 9"  (1.753 m)  Wt 97.8 kg (215 lb 9.8 oz)  BMI 31.83 kg/m2  SpO2 98%  Gen: pale, in NAD. Lungs:  CTA, no W/R/R. Heart:  RRR, SEM. Abd:  BS x 4, soft, NT/ND.  Hemoglobin & Hematocrit     Component Value Date/Time   HGB 9.8* 12/21/2013 0115   HCT 28.9* 12/21/2013 0115    Although Hgb slightly down after 1u PRBC (was 10.3 earlier), will hold off on further transfusions given that pt is hemodynamically stable and there are no obvious signs of continued bleeding.  Repeat H/H ordered for 0400.  Will continue to monitor.   Rutherford Guysahul Fantasy Donald, [place] - [facility] Pgr: [phone]  or [phone] 12/21/2013, 2:27 AM

## 2013-12-21 NOTE — H&P (View-Only) (Signed)
[facility] CON[facility] Reason for consult: G.I. bleeding Referring Physician: CCM. PCP: Dr. Lisbeth Ply. Primary G.I.: Dr. [name] is an 73 y.o. male.  HPI: he came to the emergency room after 3 episodes of hematochezia that started yesterday afternoon. He had multiple bloody stools was hypotensive in the emergency room with systolic pressure around 80. He has received transfusion. He had no preceding melena. During the night his stools have become somewhat dark. He denies abdominal pain, history of ulcers, use of NSAIDs. Colonoscopy 2012 reveal small adenomatous polyp and multiple diverticula in the descending and sigmoid colon. BUN and creatinine were both slightly elevated. Hemoglobin has dropped from 9.8 on admission to 8.5 despite transfusion. Patient denies preceding constipation.  Past Medical History  Diagnosis Date  . Hypertension   . Hypercholesteremia     History reviewed. No pertinent past surgical history.  History reviewed. No pertinent family history.  Social History:  reports that he has never smoked. He does not have any smokeless tobacco history on file. He reports that he does not drink alcohol or use illicit drugs.  Allergies: No Known Allergies  Medications; Prior to Admission medications   Medication Sig Start Date End Date Taking? Authorizing Provider  allopurinol (ZYLOPRIM) 100 MG tablet Take 200 mg by mouth every morning.   Yes Historical Provider, MD  aspirin EC 325 MG tablet Take 325 mg by mouth every evening.   Yes Historical Provider, MD  ezetimibe (ZETIA) 10 MG tablet Take 10 mg by mouth every evening.   Yes Historical Provider, MD  fenofibrate (TRICOR) 145 MG tablet Take 145 mg by mouth every evening.   Yes Historical Provider, MD  indomethacin (INDOCIN) 50 MG capsule Take 50 mg by mouth daily as needed (gout).   Yes Historical Provider, MD  losartan (COZAAR) 50 MG tablet Take 50 mg by mouth every morning.   Yes Historical Provider, MD   Multiple Vitamin (ONE-A-DAY MENS PO) Take 1 tablet by mouth every morning.   Yes Historical Provider, MD  niacin (NIASPAN) 1000 MG CR tablet Take 1,000 mg by mouth at bedtime.   Yes Historical Provider, MD  sulfamethoxazole-trimethoprim (BACTRIM DS) 800-160 MG per tablet Take 1 tablet by mouth 2 (two) times daily.    Historical Provider, MD   . sodium chloride   Intravenous Once  . pantoprazole (PROTONIX) IV  40 mg Intravenous QHS   PRN Meds sodium chloride, ondansetron (ZOFRAN) IV Results for orders placed during the hospital encounter of 12/20/13 (from the past 48 hour(s))  TYPE AND SCREEN     Status: None   Collection Time    12/20/13  9:05 PM      Result Value Ref Range   ABO/RH(D) O NEG     Antibody Screen NEG     Sample Expiration 12/23/2013     Unit Number [id number]     Blood Component Type RBC LR PHER1     Unit division 00     Status of Unit ISSUED     Transfusion Status OK TO TRANSFUSE     Crossmatch Result Compatible     Unit Number [id number]     Blood Component Type RED CELLS,LR     Unit division 00     Status of Unit ISSUED     Transfusion Status OK TO TRANSFUSE     Crossmatch Result Compatible     Unit Number [id number]     Blood Component Type RED CELLS,LR     Unit division 00  Status of Unit ALLOCATED     Unit tag comment VERBAL ORDERS PER DR LI     Transfusion Status OK TO TRANSFUSE     Crossmatch Result COMPATIBLE     Unit Number [id number]     Blood Component Type RED CELLS,LR     Unit division 00     Status of Unit ALLOCATED     Unit tag comment VERBAL ORDERS PER DR LI     Transfusion Status OK TO TRANSFUSE     Crossmatch Result COMPATIBLE    CBC WITH DIFFERENTIAL     Status: Abnormal   Collection Time    12/20/13  9:05 PM      Result Value Ref Range   WBC 7.4  4.0 - 10.5 K/uL   RBC 3.23 (*) 4.22 - 5.81 MIL/uL   Hemoglobin 10.3 (*) 13.0 - 17.0 g/dL   HCT 30.7 (*) 39.0 - 52.0 %   MCV 95.0  78.0 - 100.0 fL   MCH 31.9  26.0 - 34.0  pg   MCHC 33.6  30.0 - 36.0 g/dL   RDW 14.0  11.5 - 15.5 %   Platelets 192  150 - 400 K/uL   Neutrophils Relative % 81 (*) 43 - 77 %   Neutro Abs 6.0  1.7 - 7.7 K/uL   Lymphocytes Relative 13  12 - 46 %   Lymphs Abs 1.0  0.7 - 4.0 K/uL   Monocytes Relative 6  3 - 12 %   Monocytes Absolute 0.4  0.1 - 1.0 K/uL   Eosinophils Relative 0  0 - 5 %   Eosinophils Absolute 0.0  0.0 - 0.7 K/uL   Basophils Relative 0  0 - 1 %   Basophils Absolute 0.0  0.0 - 0.1 K/uL  TROPONIN I     Status: None   Collection Time    12/20/13  9:05 PM      Result Value Ref Range   Troponin I <0.30  <0.30 ng/mL   Comment:            Due to the release kinetics of cTnI,     a negative result within the first hours     of the onset of symptoms does not rule out     myocardial infarction with certainty.     If myocardial infarction is still suspected,     repeat the test at appropriate intervals.  ABO/RH     Status: None   Collection Time    12/20/13  9:05 PM      Result Value Ref Range   ABO/RH(D) O NEG    POC OCCULT BLOOD, ED     Status: Abnormal   Collection Time    12/20/13  9:26 PM      Result Value Ref Range   Fecal Occult Bld POSITIVE (*) NEGATIVE  PREPARE RBC (CROSSMATCH)     Status: None   Collection Time    12/20/13  9:30 PM      Result Value Ref Range   Order Confirmation ORDER PROCESSED BY BLOOD BANK    I-STAT TROPOININ, ED     Status: None   Collection Time    12/20/13  9:36 PM      Result Value Ref Range   Troponin i, poc 0.03  0.00 - 0.08 ng/mL   Comment 3            Comment: Due to the release kinetics of cTnI,     a  negative result within the first hours     of the onset of symptoms does not rule out     myocardial infarction with certainty.     If myocardial infarction is still suspected,     repeat the test at appropriate intervals.  I-STAT CG4 LACTIC ACID, ED     Status: Abnormal   Collection Time    12/20/13  9:39 PM      Result Value Ref Range   Lactic Acid, Venous 6.51  (*) 0.5 - 2.2 mmol/L  COMPREHENSIVE METABOLIC PANEL     Status: Abnormal   Collection Time    12/20/13 10:22 PM      Result Value Ref Range   Sodium 139  137 - 147 mEq/L   Potassium 4.5  3.7 - 5.3 mEq/L   Chloride 103  96 - 112 mEq/L   CO2 16 (*) 19 - 32 mEq/L   Glucose, Bld 157 (*) 70 - 99 mg/dL   BUN 54 (*) 6 - 23 mg/dL   Creatinine, Ser 2.36 (*) 0.50 - 1.35 mg/dL   Calcium 8.7  8.4 - 10.5 mg/dL   Total Protein 5.4 (*) 6.0 - 8.3 g/dL   Albumin 2.8 (*) 3.5 - 5.2 g/dL   AST 24  0 - 37 U/L   ALT 21  0 - 53 U/L   Alkaline Phosphatase 34 (*) 39 - 117 U/L   Total Bilirubin 0.2 (*) 0.3 - 1.2 mg/dL   GFR calc non Af Amer 26 (*) >90 mL/min   GFR calc Af Amer 30 (*) >90 mL/min   Comment: (NOTE)     The eGFR has been calculated using the CKD EPI equation.     This calculation has not been validated in all clinical situations.     eGFR's persistently <90 mL/min signify possible Chronic Kidney     Disease.   Anion gap 20 (*) 5 - 15  TROPONIN I     Status: None   Collection Time    12/20/13 10:22 PM      Result Value Ref Range   Troponin I <0.30  <0.30 ng/mL   Comment:            Due to the release kinetics of cTnI,     a negative result within the first hours     of the onset of symptoms does not rule out     myocardial infarction with certainty.     If myocardial infarction is still suspected,     repeat the test at appropriate intervals.  MRSA PCR SCREENING     Status: None   Collection Time    12/21/13 12:21 AM      Result Value Ref Range   MRSA by PCR NEGATIVE  NEGATIVE   Comment:            The GeneXpert MRSA Assay (FDA     approved for NASAL specimens     only), is one component of a     comprehensive MRSA colonization     surveillance program. It is not     intended to diagnose MRSA     infection nor to guide or     monitor treatment for     MRSA infections.  GLUCOSE, CAPILLARY     Status: Abnormal   Collection Time    12/21/13  1:04 AM      Result Value Ref Range    Glucose-Capillary 163 (*) 70 - 99 mg/dL  PROTIME-INR  Status: Abnormal   Collection Time    12/21/13  1:15 AM      Result Value Ref Range   Prothrombin Time 18.0 (*) 11.6 - 15.2 seconds   INR 1.47  0.00 - 1.49  TROPONIN I     Status: None   Collection Time    12/21/13  1:15 AM      Result Value Ref Range   Troponin I <0.30  <0.30 ng/mL   Comment:            Due to the release kinetics of cTnI,     a negative result within the first hours     of the onset of symptoms does not rule out     myocardial infarction with certainty.     If myocardial infarction is still suspected,     repeat the test at appropriate intervals.  LACTIC ACID, PLASMA     Status: Abnormal   Collection Time    12/21/13  1:15 AM      Result Value Ref Range   Lactic Acid, Venous 4.1 (*) 0.5 - 2.2 mmol/L  MAGNESIUM     Status: None   Collection Time    12/21/13  1:15 AM      Result Value Ref Range   Magnesium 1.8  1.5 - 2.5 mg/dL  PHOSPHORUS     Status: None   Collection Time    12/21/13  1:15 AM      Result Value Ref Range   Phosphorus 2.8  2.3 - 4.6 mg/dL  CBC     Status: Abnormal   Collection Time    12/21/13  1:15 AM      Result Value Ref Range   WBC 7.1  4.0 - 10.5 K/uL   RBC 3.08 (*) 4.22 - 5.81 MIL/uL   Hemoglobin 9.8 (*) 13.0 - 17.0 g/dL   HCT 28.9 (*) 39.0 - 52.0 %   MCV 93.8  78.0 - 100.0 fL   MCH 31.8  26.0 - 34.0 pg   MCHC 33.9  30.0 - 36.0 g/dL   RDW 14.0  11.5 - 15.5 %   Platelets 159  150 - 400 K/uL  BASIC METABOLIC PANEL     Status: Abnormal   Collection Time    12/21/13  1:15 AM      Result Value Ref Range   Sodium 135 (*) 137 - 147 mEq/L   Potassium 5.3  3.7 - 5.3 mEq/L   Chloride 102  96 - 112 mEq/L   CO2 14 (*) 19 - 32 mEq/L   Glucose, Bld 162 (*) 70 - 99 mg/dL   BUN 57 (*) 6 - 23 mg/dL   Creatinine, Ser 2.52 (*) 0.50 - 1.35 mg/dL   Calcium 8.3 (*) 8.4 - 10.5 mg/dL   GFR calc non Af Amer 24 (*) >90 mL/min   GFR calc Af Amer 27 (*) >90 mL/min   Comment: (NOTE)      The eGFR has been calculated using the CKD EPI equation.     This calculation has not been validated in all clinical situations.     eGFR's persistently <90 mL/min signify possible Chronic Kidney     Disease.   Anion gap 19 (*) 5 - 15  CBC     Status: Abnormal   Collection Time    12/21/13  3:30 AM      Result Value Ref Range   WBC 9.4  4.0 - 10.5 K/uL   RBC 2.67 (*)  4.22 - 5.81 MIL/uL   Hemoglobin 8.5 (*) 13.0 - 17.0 g/dL   HCT 24.5 (*) 39.0 - 52.0 %   MCV 91.8  78.0 - 100.0 fL   MCH 31.8  26.0 - 34.0 pg   MCHC 34.7  30.0 - 36.0 g/dL   RDW 14.1  11.5 - 15.5 %   Platelets 171  150 - 400 K/uL  PREPARE RBC (CROSSMATCH)     Status: None   Collection Time    12/21/13  3:30 AM      Result Value Ref Range   Order Confirmation ORDER PROCESSED BY BLOOD BANK    DIFFERENTIAL     Status: Abnormal   Collection Time    12/21/13  3:30 AM      Result Value Ref Range   Neutrophils Relative % 79 (*) 43 - 77 %   Neutro Abs 7.3  1.7 - 7.7 K/uL   Lymphocytes Relative 16  12 - 46 %   Lymphs Abs 1.5  0.7 - 4.0 K/uL   Monocytes Relative 5  3 - 12 %   Monocytes Absolute 0.5  0.1 - 1.0 K/uL   Eosinophils Relative 0  0 - 5 %   Eosinophils Absolute 0.0  0.0 - 0.7 K/uL   Basophils Relative 0  0 - 1 %   Basophils Absolute 0.0  0.0 - 0.1 K/uL    Dg Chest Port 1 View  12/21/2013   CLINICAL DATA:  Central line placement  EXAM: PORTABLE CHEST - 1 VIEW  COMPARISON:  12/20/2013  FINDINGS: Right IJ catheter tip projects over the cavoatrial junction. Otherwise, no significant interval change. Hypoaeration with interstitial and vascular crowding. Mild linear left lung base opacity, favor atelectasis. Small effusions not excluded. No pneumothorax. Mild aortic tortuosity. Heart size upper normal to mildly enlarged. No acute osseous finding.  IMPRESSION: Right IJ catheter tip projects over the cavoatrial junction. No pneumothorax.  Hypoaeration and mild left lung base opacity, favor atelectasis.   Electronically  Signed   By: Carlos Levering M.D.   On: 12/21/2013 05:22   Dg Chest Portable 1 View  12/21/2013   CLINICAL DATA:  Syncope.  EXAM: PORTABLE CHEST - 1 VIEW  COMPARISON:  None.  FINDINGS: Study is degraded by multiple overlapping wires and exclusion of the lateral left base.  No definitive cardiomegaly. Negative aortic contours. There is no edema, consolidation, effusion, or pneumothorax.  IMPRESSION: No evidence of acute disease.   Electronically Signed   By: Jorje Guild M.D.   On: 12/21/2013 00:31               Blood pressure 91/50, pulse 102, temperature 97.7 F (36.5 C), temperature source Oral, resp. rate 28, height 5' 9"  (1.753 m), weight 97.8 kg (215 lb 9.8 oz), SpO2 96.00%.  Physical exam:   General--pleasant white male alert and oriented in no acute distress Heart-- somewhat tachycardic Lungs--clear Abdomen-- soft completely nontender Assessment: 1. G.I. Bleed. This is probably a diverticular bleed based on the history, hypotension etc. I agree with going ahead and treating them with PPI therapy empirically in getting the G.I. bleeding scan. He does have extensive diverticulosis on previous colonoscopy  Plan: we will follow with you and check on the results of the G.I. bleeding scan   Drew [name],Drew [name] L 12/21/2013, 7:13 AM

## 2013-12-21 NOTE — Progress Notes (Signed)
Discussed with Dr Tyson AliasFeinstein and at his request will try to go ahead with EGD/small bowel enteroscopy today. We'll try to get this added on this afternoon.

## 2013-12-21 NOTE — Procedures (Signed)
Central Venous Catheter Insertion Procedure Note Drew DownsJohn C [name] [id number] 11-15-1940  Procedure: Insertion of Central Venous Catheter Indications: Assessment of intravascular volume, Drug and/or fluid administration and Frequent blood sampling  Procedure Details Consent: Risks of procedure as well as the alternatives and risks of each were explained to the (patient/caregiver).  Consent for procedure obtained. Time Out: Verified patient identification, verified procedure, site/side was marked, verified correct patient position, special equipment/implants available, medications/allergies/relevent history reviewed, required imaging and test results available.  Performed  Maximum sterile technique was used including antiseptics, cap, gloves, gown, hand hygiene, mask and sheet. Skin prep: Chlorhexidine; local anesthetic administered A antimicrobial bonded/coated triple lumen catheter was placed in the right internal jugular vein using the Seldinger technique.  Evaluation Blood flow good Complications: No apparent complications Patient did tolerate procedure well. Chest X-ray ordered to verify placement.  CXR: pending.  Procedure performed under direct ultrasound guidance for real time vessel cannulation.      Drew [name], Drew [name] - [facility] Pgr: [phone]  or [phone] 12/21/2013, 4:38 AM   Levy Pupaobert Janat Tabbert, MD, PhD 12/21/2013, 5:50 AM [facility] [phone] or if no answer [phone]

## 2013-12-21 NOTE — Progress Notes (Addendum)
Okay to use inserted central line catheter and may hold the stat am labs after the blood transfusion per Dr. Marchelle Gearingamaswamy.

## 2013-12-21 NOTE — Op Note (Signed)
[facility] [address] [place], [id number]   ENTEROSCOPY PROCEDURE REPORT     EXAM DATE: 12/21/2013  PATIENT NAME:      Drew [name], Drew [name]           MR #:      [id number]  BIRTHDATE:       1940/08/21      VISIT #:     [id number] ATTENDING:     Carman ChingJames Amri Lien, MD     STATUS:     inpatient ASSISTANT:      Nilsa NuttingAkande, Felicia and Murlean HarkHenderson, Katha REFERRING MD: ASA CLASS:        Class III  INDICATIONS:  The patient is a 73 yr old male here for an enteroscopy procedure due to patient has G.I.  bleeding and G.I. bleeding scan suggests bleeding in the proximal jejunum.  He has no heartburn reflux symptoms.Marland Kitchen. PROCEDURE PERFORMED:     Diagnostic small bowel enteroscopy  MEDICATIONS:     Fentanyl 50 mcg IV, Versed 4 mg IV, and cetacaine spray  CONSENT: The patient understands the risks and benefits of the procedure and understands that these risks include, but are not limited to: sedation, allergic reaction, infection, perforation and/or bleeding. Alternative means of evaluation and treatment include, among others: physical exam, x-rays, and/or surgical intervention. The patient elects to proceed with this endoscopic procedure.  DESCRIPTION OF PROCEDURE: During intra-op preparation period all mechanical & medical equipment was checked for proper function. Hand hygiene and appropriate measures for infection prevention was taken. After the risks, benefits and alternatives of the procedure were thoroughly explained, Informed consent was verified, confirmed and timeout was successfully executed by the treatment team. The Pentax VSB-2900 endoscope was introduced through the mouth and advanced to the proximal jejunum jejunum. We were able to pass 175 cm scope at that point were unable to pass further.. The prep was The overall prep quality was good.. The instrument was then slowly withdrawn while examining the mucosa circumferentially. The scope was then completely  withdrawn from the patient and the procedure terminated. The pulse, BP, and O2 saturation were monitored and documented by the physician and the nursing staff throughout the entire procedure.  The patient was cared for as planned according to standard protocol, then discharged to recovery in stable condition and with appropriate post procedure care.    JEJUNUM: There was no active bleeding in the jejunum.  DUODENUM: Two medium sized non-bleeding ulcers were found in the duodenal bulb.  STOMACH: There was no active bleeding in the stomach and no alterations were saying.  ESOPHAGUS: The GE junction was severely ulcerated and probable with signs of recent bleeding.  It was not actively bleeding at this time.  This could have been a tumor or all reflux esophagitis.  Due to the active bleeding no biopsies were obtained.    ADVERSE EVENTS:      There were no immediate complications.  IMPRESSIONS:     1.  There was no active bleeding in the jejunum 2.  Two medium sized ulcers were found in the duodenal bulb 3.  There was no active bleeding in the stomach and no alterations were saying 4.  The GE junction was severely ulcerated and probable with signs of recent bleeding.  It was not actively bleeding at this time. This could have been a tumor or all reflux esophagitis.  Due to the active bleeding no biopsies were obtained  RECOMMENDATIONS:     we will continue to  treat with PPI therapy and led carafate liquid.  After 6 to 8 weeks of treatment he should have EGD repeated to make certain that the severe ulcerated area at the GE junction has healed and does not in fact represent a malignancy RECALL:  _____________________________ Carman ChingJames Nicholad Kautzman, MD eSigned:  Carman ChingJames Jonuel Butterfield, MD 12/21/2013 5:27 PM   cc:     PATIENT NAME:  Drew [name], Drew [name] MR#: [id number]

## 2013-12-21 NOTE — Progress Notes (Signed)
Asked to camera in by bedside RN  Apparently diaphoretic and low bp but repeat bp is 98/60 , HR 102, RR 24, Pulse ox 97% Looks ok on camera Not actively bleeding    - he has completed his prbc  Plan Check stat cbc, bmet, lactate, trop  Dr. Cierrah Dace, M.D., F.C.C.P Pulmonary and Critical Care Medicine Staff Physician [facility] Pager: [phone], If no answer or between  15:00h - 7:00h: call [phone]  12/21/2013 12:59 AM    

## 2013-12-21 NOTE — Progress Notes (Addendum)
   In past 1h - patient has had 2 large BM bloody and is diaphoretic   Recent Labs Lab 12/20/13 2105 12/21/13 0115  HGB 10.3* 9.8*    Recent Labs Lab 12/21/13 0115  INR 1.47     Plan Stat cbc 1 unit prbc stat Call GI consult - d/w Dr Ewing SchleinMAgod - who is recommending tagged red cell scan first   Dr. Kalman ShanMurali Khyrin Trevathan, M.D., [facility] Pulmonary and Critical Care Medicine Staff Physician [facility] Pager: [phone], If no answer or between  15:00h - 7:00h: call [phone]  12/21/2013 3:16 AM

## 2013-12-21 NOTE — Progress Notes (Signed)
eLink Physician-Brief Progress Note Patient Name: Drew DownsJohn C [name] DOB: 19-Jun-1940 MRN: [id number]   Date of Service  12/21/2013  HPI/Events of Note  K 5.9 GI bleed, no sign of active bleeding  eICU Interventions  Treat hyperkalemia> d50, insulin, calcium, then repeat BMET Check h/h with repeat BMET     Intervention Category Major Interventions: Electrolyte abnormality - evaluation and management;Hemorrhage - evaluation and management  Karlei Waldo 12/21/2013, 7:20 PM

## 2013-12-21 NOTE — Progress Notes (Signed)
Pt. Has 2 large bloody stool with clots when pt. Repositioned very short of breath, and feeling hot and sweaty . Dr. Marchelle Gearingamaswamy notified.

## 2013-12-22 DIAGNOSIS — K625 Hemorrhage of anus and rectum: Secondary | ICD-10-CM

## 2013-12-22 LAB — BASIC METABOLIC PANEL
ANION GAP: 10 (ref 5–15)
ANION GAP: 10 (ref 5–15)
BUN: 57 mg/dL — ABNORMAL HIGH (ref 6–23)
BUN: 46 mg/dL — ABNORMAL HIGH (ref 6–23)
CALCIUM: 8.4 mg/dL (ref 8.4–10.5)
CO2: 19 meq/L (ref 19–32)
CO2: 20 meq/L (ref 19–32)
CREATININE: 2.05 mg/dL — AB (ref 0.50–1.35)
CREATININE: 1.77 mg/dL — AB (ref 0.50–1.35)
Calcium: 8.4 mg/dL (ref 8.4–10.5)
Chloride: 110 mEq/L (ref 96–112)
Chloride: 107 mEq/L (ref 96–112)
GFR calc Af Amer: 35 mL/min — ABNORMAL LOW (ref >90)
GFR calc Af Amer: 42 mL/min — ABNORMAL LOW (ref >90)
GFR calc non Af Amer: 36 mL/min — ABNORMAL LOW (ref >90)
GFR, EST NON AFRICAN AMERICAN: 30 mL/min — AB (ref >90)
Glucose, Bld: 147 mg/dL — ABNORMAL HIGH (ref 70–99)
Glucose, Bld: 174 mg/dL — ABNORMAL HIGH (ref 70–99)
Potassium: 5.2 mEq/L (ref 3.7–5.3)
Potassium: 4.7 mEq/L (ref 3.7–5.3)
SODIUM: 139 meq/L (ref 137–147)
Sodium: 137 mEq/L (ref 137–147)

## 2013-12-22 LAB — APTT: aPTT: 29 seconds (ref 24–37)

## 2013-12-22 LAB — GLUCOSE, CAPILLARY
GLUCOSE-CAPILLARY: 138 mg/dL — AB (ref 70–99)
GLUCOSE-CAPILLARY: 142 mg/dL — AB (ref 70–99)
GLUCOSE-CAPILLARY: 183 mg/dL — AB (ref 70–99)
Glucose-Capillary: 146 mg/dL — ABNORMAL HIGH (ref 70–99)
Glucose-Capillary: 137 mg/dL — ABNORMAL HIGH (ref 70–99)

## 2013-12-22 LAB — CBC
HCT: 22.5 % — ABNORMAL LOW (ref 39.0–52.0)
HEMOGLOBIN: 7.8 g/dL — AB (ref 13.0–17.0)
MCH: 31.7 pg (ref 26.0–34.0)
MCHC: 34.7 g/dL (ref 30.0–36.0)
MCV: 91.5 fL (ref 78.0–100.0)
Platelets: 124 10*3/uL — ABNORMAL LOW (ref 150–400)
RBC: 2.46 MIL/uL — AB (ref 4.22–5.81)
RDW: 15.4 % (ref 11.5–15.5)
WBC: 13.3 10*3/uL — ABNORMAL HIGH (ref 4.0–10.5)

## 2013-12-22 LAB — PROTIME-INR
INR: 1.32 (ref 0.00–1.49)
PROTHROMBIN TIME: 16.5 s — AB (ref 11.6–15.2)

## 2013-12-22 MED ORDER — SUCRALFATE 1 GM/10ML PO SUSP
1.0000 g | Freq: Three times a day (TID) | ORAL | Status: DC
  Administered 2013-12-22 – 2013-12-28 (×23): 1 g via ORAL
  Filled 2013-12-22 (×28): qty 10

## 2013-12-22 MED ORDER — NIACIN ER (ANTIHYPERLIPIDEMIC) 1000 MG PO TBCR: 1000.0000 mg | EXTENDED_RELEASE_TABLET | Freq: Every day | ORAL | Status: DC

## 2013-12-22 MED ORDER — PANTOPRAZOLE SODIUM 40 MG PO TBEC
40.0000 mg | DELAYED_RELEASE_TABLET | Freq: Two times a day (BID) | ORAL | Status: DC
  Administered 2013-12-22 – 2013-12-23 (×2): 40 mg via ORAL
  Filled 2013-12-22 (×2): qty 1

## 2013-12-22 MED ORDER — INDOMETHACIN 50 MG PO CAPS
50.0000 mg | ORAL_CAPSULE | Freq: Every day | ORAL | Status: DC | PRN
  Filled 2013-12-22: qty 1

## 2013-12-22 MED ORDER — NIACIN ER 500 MG PO CPCR
1000.0000 mg | ORAL_CAPSULE | Freq: Every day | ORAL | Status: DC
  Administered 2013-12-22 – 2013-12-27 (×6): 1000 mg via ORAL
  Filled 2013-12-22 (×7): qty 2

## 2013-12-22 MED ORDER — ASPIRIN EC 325 MG PO TBEC
325.0000 mg | DELAYED_RELEASE_TABLET | Freq: Every evening | ORAL | Status: DC
  Filled 2013-12-22: qty 1

## 2013-12-22 MED ORDER — ALLOPURINOL 100 MG PO TABS
200.0000 mg | ORAL_TABLET | ORAL | Status: DC
  Administered 2013-12-22 – 2013-12-28 (×7): 200 mg via ORAL
  Filled 2013-12-22 (×10): qty 2

## 2013-12-22 MED ORDER — EZETIMIBE 10 MG PO TABS
10.0000 mg | ORAL_TABLET | Freq: Every evening | ORAL | Status: DC
  Administered 2013-12-22 – 2013-12-27 (×6): 10 mg via ORAL
  Filled 2013-12-22 (×7): qty 1

## 2013-12-22 MED ORDER — SULFAMETHOXAZOLE-TMP DS 800-160 MG PO TABS
1.0000 | ORAL_TABLET | Freq: Two times a day (BID) | ORAL | Status: DC
  Administered 2013-12-22 – 2013-12-23 (×2): 1 via ORAL
  Filled 2013-12-22 (×3): qty 1

## 2013-12-22 MED ORDER — LOSARTAN POTASSIUM 50 MG PO TABS
50.0000 mg | ORAL_TABLET | ORAL | Status: DC
  Administered 2013-12-22 – 2013-12-23 (×2): 50 mg via ORAL
  Filled 2013-12-22 (×3): qty 1

## 2013-12-22 NOTE — Progress Notes (Signed)
Pt arrived to floor, slightly short of breath.  O2 at 2L.  Pt asked for bedpan and had 500 cc of urine/liquid mix with dark to medium blood semi formed bm mixed in.  Pt's scrotum is largely edematous as per PTA per pt.  VS:  98.5, 96, 123/64 at present.  O2 sat=98%.  Rapid Response called to notify and Dr. Delton CoombesByrum paged.

## 2013-12-22 NOTE — Progress Notes (Signed)
Subjective: No further bleeding; none since yesterday afternoon. No abdominal pain. Is hungry.  Objective: Vital signs in last 24 hours: Temp:  [97.9 F (36.6 C)-99.3 F (37.4 C)] 97.9 F (36.6 C) (10/31 1132) Pulse Rate:  [76-116] 76 (10/31 0600) Resp:  [17-29] 20 (10/31 0600) BP: (59-128)/(44-82) 98/61 mmHg (10/31 0600) SpO2:  [92 %-99 %] 92 % (10/31 0600) Weight:  [98.6 kg (217 lb 6 oz)] 98.6 kg (217 lb 6 oz) (10/31 0600) Weight change: -1.645 kg (-3 lb 10 oz) Last BM Date: 12/21/13  PE: GEN:  Chronically ill-appearing, NAD, overweight ABD:  Soft, non-tender, active bowel sounds  Lab Results: CBC    Component Value Date/Time   WBC 13.3* 12/22/2013 0415   RBC 2.46* 12/22/2013 0415   HGB 7.8* 12/22/2013 0415   HCT 22.5* 12/22/2013 0415   PLT 124* 12/22/2013 0415   MCV 91.5 12/22/2013 0415   MCH 31.7 12/22/2013 0415   MCHC 34.7 12/22/2013 0415   RDW 15.4 12/22/2013 0415   LYMPHSABS 1.5 12/21/2013 1025   MONOABS 0.7 12/21/2013 1025   EOSABS 0.0 12/21/2013 1025   BASOSABS 0.0 12/21/2013 1025   CMP     Component Value Date/Time   NA 139 12/22/2013 0400   K 5.2 12/22/2013 0400   CL 110 12/22/2013 0400   CO2 19 12/22/2013 0400   GLUCOSE 147* 12/22/2013 0400   BUN 57* 12/22/2013 0400   CREATININE 2.05* 12/22/2013 0400   CALCIUM 8.4 12/22/2013 0400   PROT 5.4* 12/20/2013 2222   ALBUMIN 2.8* 12/20/2013 2222   AST 24 12/20/2013 2222   ALT 21 12/20/2013 2222   ALKPHOS 34* 12/20/2013 2222   BILITOT 0.2* 12/20/2013 2222   GFRNONAA 30* 12/22/2013 0400   GFRAA 35* 12/22/2013 0400   Assessment:  1.  GI bleeding, maroon stools, resolved.  Suspect from duodenal ulcers, and perhaps less so from severe distal erosive esophagitis. 2.  Anemia, likely multifactorial, but certainly with significant GI tract component.  Plan:  1.  Can transition Protonix to 40 mg po bid for 6 weeks, then deescalate to 40 mg po qd and continue at this dose until otherwise specified. 2.  No  NSAIDs. 3.  Clear liquid diet, advance slowly as tolerated. 4.  OK to transition out of ICU from GI tract perspective. 5.  Will follow.   Drew [name],Drew [name] 12/22/2013, 12:43 PM

## 2013-12-22 NOTE — Progress Notes (Signed)
Pt sat 89 on 3L, increased O2 to 4L, sat up to 95.  Will monitor on continuous sat monitor.

## 2013-12-22 NOTE — Progress Notes (Signed)
PULMONARY / CRITICAL CARE MEDICINE   Name: Drew [name] MRN: [id number] DOB: [birth year]    ADMISSION DATE:  12/20/2013 CONSULTATION DATE:  12/22/2013  REFERRING MD :  EDP  CHIEF COMPLAINT:  Lower GI Bleed  INITIAL PRESENTATION: 73 y.o. M [facility] 10/29 after he had 3 episodes of hematochezia starting around 4pm same day.  In ED, pt had frank blood per rectum per EDP and pt was hypotensive with SBP in 70's - 80's.  PCCM consulted for admission.   STUDIES:  10/30 tagged red blood cell>>>pos jejunal?  SIGNIFICANT EVENTS: 10/29 - admitted with LGIB 10/30 - borderline BP, tag scan positive  SUBJECTIVE:  No distress, borderline BP  VITAL SIGNS: Temp:  [97.9 F (36.6 C)-99.3 F (37.4 C)] 97.9 F (36.6 C) (10/31 1132) Pulse Rate:  [76-116] 80 (10/31 1300) Resp:  [17-29] 26 (10/31 1300) BP: (59-128)/(44-82) 120/59 mmHg (10/31 1300) SpO2:  [92 %-99 %] 97 % (10/31 1300) Weight:  [98.6 kg (217 lb 6 oz)] 98.6 kg (217 lb 6 oz) (10/31 0600) HEMODYNAMICS: CVP:  [4 mmHg-6 mmHg] 6 mmHg VENTILATOR SETTINGS:   INTAKE / OUTPUT: Intake/Output     10/30 0701 - 10/31 0700 10/31 0701 - 11/01 0700   I.V. (mL/kg) 1605 (16.3)    Blood 335    Other 50    IV Piggyback 110    Total Intake(mL/kg) 2100 (21.3)    Urine (mL/kg/hr) 1925 (0.8)    Stool     Total Output 1925     Net +175            PHYSICAL EXAMINATION: General:  pale in color Neuro: A&O x 3, non-focal.  HEENT: jvd down Cardiovascular: Tachy but regular, 2/6 SEM.  Lungs: CTA Abdomen: BS x 4, soft, ND, mild ruq pain Musculoskeletal: No gross deformities, no edema.  Skin: Intact, warm, no rashes.  LABS:  CBC  Recent Labs Lab 12/21/13 0330 12/21/13 1025 12/21/13 1442 12/21/13 2231 12/22/13 0415  WBC 9.4 10.0  --   --  13.3*  HGB 8.5* 9.0* 8.1* 7.1* 7.8*  HCT 24.5* 25.9* 23.3* 20.4* 22.5*  PLT 171 141*  --   --  124*   Coag's  Recent Labs Lab 12/21/13 0115 12/21/13 1442 12/22/13 0400  APTT  --   31 29  INR 1.47  --  1.32   BMET  Recent Labs Lab 12/21/13 1442 12/21/13 2231 12/22/13 0400  NA 138 140 139  K 5.9* 5.1 5.2  CL 109 111 110  CO2 17* 16* 19  BUN 67* 64* 57*  CREATININE 2.45* 2.37* 2.05*  GLUCOSE 162* 164* 147*   Electrolytes  Recent Labs Lab 12/21/13 0115 12/21/13 0900 12/21/13 1442 12/21/13 2231 12/22/13 0400  CALCIUM 8.3*  --  7.9* 8.3* 8.4  MG 1.8 1.9  --   --   --   PHOS 2.8 2.5  --   --   --    Sepsis Markers  Recent Labs Lab 12/21/13 0115 12/21/13 0900 12/21/13 1442  LATICACIDVEN 4.1* 2.3* 2.5*   ABG No results found for this basename: PHART, PCO2ART, PO2ART,  in the last 168 hours Liver Enzymes  Recent Labs Lab 12/20/13 2222  AST 24  ALT 21  ALKPHOS 34*  BILITOT 0.2*  ALBUMIN 2.8*   Cardiac Enzymes  Recent Labs Lab 12/20/13 2222 12/21/13 0115 12/21/13 0900  TROPONINI <0.30 <0.30 <0.30   Glucose  Recent Labs Lab 12/21/13 1535 12/21/13 1958 12/22/13 0035 12/22/13 0449 12/22/13  0732 12/22/13 1123  GLUCAP 163* 148* 146* 138* 137* 142*    Imaging Nm Gi Blood Loss  12/21/2013   ADDENDUM REPORT: 12/21/2013 10:17  ADDENDUM: Critical Value/emergent results were called by telephone at the time of interpretation on 12/21/2013 at 9:45 Am to Dr. Tyson AliasFEINSTEIN, who verbally acknowledged these results.   Electronically Signed   By: Charlett NoseKevin  Dover M.D.   On: 12/21/2013 10:17   12/21/2013   CLINICAL DATA:  Acute GI bleed.  Abdominal pain.  EXAM: NUCLEAR MEDICINE GASTROINTESTINAL BLEEDING SCAN  TECHNIQUE: Sequential abdominal images were obtained following intravenous administration of Tc-5952m labeled red blood cells.  RADIOPHARMACEUTICALS:  25.0 mCi Tc-3652m in-vitro labeled red cells.  COMPARISON:  None.  FINDINGS: Abnormal activity is seen within a left abdominal small bowel loops within the first hr. Continuation of the study into second are demonstrates this is definitively within small bowel. Findings most compatible with a  proximal/jejunal GI bleed.  IMPRESSION: Radiotracer accumulation within left abdominal small bowel loops most compatible with proximal/jejunal GI bleed.  Electronically Signed: By: Charlett NoseKevin  Dover M.D. On: 12/21/2013 09:29   Dg Chest Port 1 View  12/21/2013   CLINICAL DATA:  Central line placement  EXAM: PORTABLE CHEST - 1 VIEW  COMPARISON:  12/20/2013  FINDINGS: Right IJ catheter tip projects over the cavoatrial junction. Otherwise, no significant interval change. Hypoaeration with interstitial and vascular crowding. Mild linear left lung base opacity, favor atelectasis. Small effusions not excluded. No pneumothorax. Mild aortic tortuosity. Heart size upper normal to mildly enlarged. No acute osseous finding.  IMPRESSION: Right IJ catheter tip projects over the cavoatrial junction. No pneumothorax.  Hypoaeration and mild left lung base opacity, favor atelectasis.   Electronically Signed   By: Jearld LeschAndrew  DelGaizo M.D.   On: 12/21/2013 05:22     ASSESSMENT / PLAN:  HEMATOLOGIC A:   Anemia - from acute blood loss secondary to LGIB, s/p 1 unit PRBC's in ED VTE Prophylaxis P:  H/H q6hrs x 3. CVC in place, see cvs Check coags, add ptt SCD's only.  GASTROINTESTINAL A:   GIB Tag scan possible jejunal?, EGD > duodenal ulcers, GE junctional ulcer (no biopsies) GI prophylaxis Nutrition - npo P:   Monitor for further episodes of hematochezia / melena >none for > 24h SUP: Pantoprazole (off gtt) Starting diet Follow CBC Appreciate GI assistance   PULMONARY A: Stable  P:   Pulmonary hygiene.  CARDIOVASCULAR A:  Hx HTN, HLD At risk demand ischemia Borderline BP Active bleeding P:  Monitor hemodynamics. Goal MAP > 60 Hold outpatient aspirin, zetia, fenofibrate, losartan, niacin. Consider restart 11/1   RENAL A:   AG metabolic acidosis - lactate AKI (SCr 2.36) - unknown baseline SCr, suspect pre-renal secondary to hypovolemia from LGIB, improving P:   Follow BMP  INFECTIOUS A:    No evidence of infection P:   Monitor clinically.  ENDOCRINE A:   No known issues   P:   Monitor glucose on BMP.  NEUROLOGIC A:   No acute issues P:   No interventions required.   Family updated: Wife and son-in law at bedside 10/29  Interdisciplinary Family Meeting v Palliative Care Meeting:  Due by: 11/6   TODAY'S SUMMARY: Transfer to floor on 10/31   Levy Pupaobert Byrum, MD, PhD 12/22/2013, 2:35 PM [place] [facility] [phone] or if no answer [phone]

## 2013-12-22 NOTE — Progress Notes (Signed)
Dr. Delton CoombesByrum notified of pt's bloody stool episode and asked about ASA.  Per Dr. Delton CoombesByrum, will hold ASA for tonight, leave IV rate at 20, no additional labs at this time, to notify him if any additional episodes.

## 2013-12-22 NOTE — Progress Notes (Signed)
Sarah, with Rapid Response called back to check on pt status, updated, will report off to Night RR nurse about pt.

## 2013-12-23 DIAGNOSIS — I9589 Other hypotension: Secondary | ICD-10-CM | POA: Insufficient documentation

## 2013-12-23 DIAGNOSIS — K274 Chronic or unspecified peptic ulcer, site unspecified, with hemorrhage: Secondary | ICD-10-CM

## 2013-12-23 DIAGNOSIS — N289 Disorder of kidney and ureter, unspecified: Secondary | ICD-10-CM

## 2013-12-23 DIAGNOSIS — R58 Hemorrhage, not elsewhere classified: Secondary | ICD-10-CM | POA: Insufficient documentation

## 2013-12-23 LAB — BASIC METABOLIC PANEL
ANION GAP: 9 (ref 5–15)
BUN: 39 mg/dL — ABNORMAL HIGH (ref 6–23)
CO2: 22 mEq/L (ref 19–32)
Calcium: 8.3 mg/dL — ABNORMAL LOW (ref 8.4–10.5)
Chloride: 107 mEq/L (ref 96–112)
Creatinine, Ser: 1.76 mg/dL — ABNORMAL HIGH (ref 0.50–1.35)
GFR, EST AFRICAN AMERICAN: 42 mL/min — AB (ref >90)
GFR, EST NON AFRICAN AMERICAN: 36 mL/min — AB (ref >90)
Glucose, Bld: 121 mg/dL — ABNORMAL HIGH (ref 70–99)
POTASSIUM: 4.2 meq/L (ref 3.7–5.3)
SODIUM: 138 meq/L (ref 137–147)

## 2013-12-23 LAB — HEMOGLOBIN AND HEMATOCRIT, BLOOD
HCT: 20.4 % — ABNORMAL LOW (ref 39.0–52.0)
HEMOGLOBIN: 6.9 g/dL — AB (ref 13.0–17.0)

## 2013-12-23 MED ORDER — SODIUM CHLORIDE 0.9 % IJ SOLN
10.0000 mL | INTRAMUSCULAR | Status: DC | PRN
  Administered 2013-12-23 – 2013-12-24 (×3): 20 mL
  Administered 2013-12-25 – 2013-12-26 (×2): 30 mL
  Filled 2013-12-23 (×5): qty 40

## 2013-12-23 MED ORDER — AMLODIPINE BESYLATE 10 MG PO TABS
10.0000 mg | ORAL_TABLET | Freq: Every day | ORAL | Status: DC
  Administered 2013-12-24 – 2013-12-28 (×5): 10 mg via ORAL
  Filled 2013-12-23 (×3): qty 1
  Filled 2013-12-23: qty 2
  Filled 2013-12-23 (×2): qty 1

## 2013-12-23 MED ORDER — FUROSEMIDE 10 MG/ML IJ SOLN
20.0000 mg | Freq: Once | INTRAMUSCULAR | Status: DC
  Administered 2013-12-23: 20 mg via INTRAVENOUS
  Filled 2013-12-23: qty 2

## 2013-12-23 MED ORDER — FUROSEMIDE 10 MG/ML IJ SOLN
40.0000 mg | Freq: Once | INTRAMUSCULAR | Status: AC
  Administered 2013-12-23: 20 mg via INTRAVENOUS
  Filled 2013-12-23 (×2): qty 4

## 2013-12-23 MED ORDER — DIPHENHYDRAMINE HCL 50 MG/ML IJ SOLN: 25.0000 mg | Freq: Four times a day (QID) | INTRAMUSCULAR | Status: DC | PRN

## 2013-12-23 MED ORDER — SODIUM CHLORIDE 0.9 % IV SOLN
Freq: Once | INTRAVENOUS | Status: DC
  Administered 2013-12-23: 13:00:00 via INTRAVENOUS

## 2013-12-23 MED ORDER — PANTOPRAZOLE SODIUM 40 MG IV SOLR
40.0000 mg | Freq: Two times a day (BID) | INTRAVENOUS | Status: DC
  Administered 2013-12-23 – 2013-12-28 (×11): 40 mg via INTRAVENOUS
  Filled 2013-12-23 (×14): qty 40

## 2013-12-23 NOTE — Progress Notes (Signed)
Patient Demographics  Drew HeinrichJohn [name], is a 73 y.o. male, DOB - 23-Aug-1940, LKG:[id number]  Admit date - 12/20/2013   Admitting Physician Leslye Peerobert S Byrum, MD  Outpatient Primary MD for the patient is No primary care provider on file.  LOS - 3   Chief Complaint  Patient presents with  . Rectal Bleeding      Summary -  73yr old man H/O gout, dyslipidemia, HTN, who was on ASA-NSADIS and Bactrim (did not know why), was admitted by PCCM 3 days for blood in stool, got 1 unit PRBC, seen by GI, placed on IV PPI, EGD and Tagged RBC scan suggest Jujenal bleed and gastritis, transferred to me today, Hb 6.9.    Subjective:   Drew HeinrichJohn [name] today has, No headache, No chest pain, No abdominal pain - No Nausea, No new weakness tingling or numbness, No Cough - SOB.   Assessment & Plan    1. Anemia due to UGI bleed - got 1 unit PRBC by PCCm 2 days ago, seen by GI  Now on PO PPI, EGD and Tagged RBC scan suggest Jujenal bleed and gastritis, transferred to me today, Hb 6.9. No new bleed, transfuuse 2 units with lasix, monitor H&H, DC  ASA- NSAIDs NOW. GI following, on clears.   2.HTN - stop ARB due to ARF, Norvasc for now.   3.Renal Failure - unclear baseline, Renal failure improving, DC NSAIDs, ARB & Bactrim, Transfuse,  monitor.   4.Dyslipidemia - continue Niacin & Zetia.   5.Gout - stable on Allopurinol.      Code Status: Full  Family Communication: daughter  Disposition Plan: TBD, PT Eval   Procedures RBC Scan, EGD  EGD  ADVERSE EVENTS: There were no immediate complications.  IMPRESSIONS: 1. There was no active bleeding in the jejunum 2. Two medium sized ulcers were found in the duodenal bulb 3. There was no active bleeding in the stomach and no alterations were saying 4. The GE  junction was severely ulcerated and probable with signs of recent bleeding. It was not actively bleeding at this time. This could have been a tumor or all reflux esophagitis. Due to the active bleeding no biopsies were obtained  RECOMMENDATIONS: we will continue to treat with PPI therapy and led carafate liquid. After 6 to 8 weeks of treatment he should have EGD repeated to make certain that the severe ulcerated area at the GE junction has healed and does not in fact represent a Malignancy  Consults PCCM, GI   Medications  Scheduled Meds: . sodium chloride   Intravenous Once  . allopurinol  200 mg Oral BH-q7a  . aspirin EC  325 mg Oral QPM  . ezetimibe  10 mg Oral QPM  . furosemide  40 mg Intravenous Once  . losartan  50 mg Oral BH-q7a  . niacin  1,000 mg Oral QHS  . pantoprazole  40 mg Oral BID AC  . sucralfate  1 g Oral TID WC & HS   Continuous Infusions:  PRN Meds:.sodium chloride, diphenhydrAMINE, indomethacin, ondansetron (ZOFRAN) IV, sodium chloride  DVT Prophylaxis   SCDs    Lab Results  Component Value Date   PLT 124* 12/22/2013    Antibiotics    Anti-infectives    Start  Dose/Rate Route Frequency Ordered Stop   12/22/13 1644  sulfamethoxazole-trimethoprim (BACTRIM DS) 800-160 MG per tablet 1 tablet  Status:  Discontinued     1 tablet Oral 2 times daily 12/22/13 1644 12/23/13 1221          Objective:   Filed Vitals:   12/22/13 2155 12/23/13 0110 12/23/13 0540 12/23/13 0948  BP: 91/45 102/57 100/54 108/62  Pulse: 75 75 74 70  Temp: 98.3 F (36.8 C) 97.8 F (36.6 C) 98 F (36.7 C) 98.6 F (37 C)  TempSrc: Oral Oral Oral Oral  Resp: 18 18 18 18   Height:      Weight:   96 kg (211 lb 10.3 oz)   SpO2: 95% 96% 97% 96%    Wt Readings from Last 3 Encounters:  12/23/13 96 kg (211 lb 10.3 oz)     Intake/Output Summary (Last 24 hours) at 12/23/13 1223 Last data filed at 12/23/13 0958  Gross per 24 hour  Intake    740 ml  Output   1975  ml  Net  -1235 ml     Physical Exam  Awake Alert, Oriented X 3, No new F.N deficits, Normal affect [place].AT,PERRAL Supple Neck,No JVD, No cervical lymphadenopathy appriciated.  Symmetrical Chest wall movement, Good air movement bilaterally, CTAB RRR,No Gallops,Rubs or new Murmurs, No Parasternal Heave +ve B.Sounds, Abd Soft, No tenderness, No organomegaly appriciated, No rebound - guarding or rigidity. No Cyanosis, Clubbing or edema, No new Rash or bruise      Data Review   Micro Results Recent Results (from the past 240 hour(s))  MRSA PCR Screening     Status: None   Collection Time: 12/21/13 12:21 AM  Result Value Ref Range Status   MRSA by PCR NEGATIVE NEGATIVE Final    Comment:        The GeneXpert MRSA Assay (FDA approved for NASAL specimens only), is one component of a comprehensive MRSA colonization surveillance program. It is not intended to diagnose MRSA infection nor to guide or monitor treatment for MRSA infections.    Radiology Reports Nm Gi Blood Loss  12/21/2013   ADDENDUM REPORT: 12/21/2013 10:17  ADDENDUM: Critical Value/emergent results were called by telephone at the time of interpretation on 12/21/2013 at 9:45 Am to Dr. Tyson Alias, who verbally acknowledged these results.   Electronically Signed   By: Charlett Nose M.D.   On: 12/21/2013 10:17   12/21/2013   CLINICAL DATA:  Acute GI bleed.  Abdominal pain.  EXAM: NUCLEAR MEDICINE GASTROINTESTINAL BLEEDING SCAN  TECHNIQUE: Sequential abdominal images were obtained following intravenous administration of Tc-38m labeled red blood cells.  RADIOPHARMACEUTICALS:  25.0 mCi Tc-37m in-vitro labeled red cells.  COMPARISON:  None.  FINDINGS: Abnormal activity is seen within a left abdominal small bowel loops within the first hr. Continuation of the study into second are demonstrates this is definitively within small bowel. Findings most compatible with a proximal/jejunal GI bleed.  IMPRESSION: Radiotracer accumulation  within left abdominal small bowel loops most compatible with proximal/jejunal GI bleed.  Electronically Signed: By: Charlett Nose M.D. On: 12/21/2013 09:29   Dg Chest Port 1 View  12/21/2013   CLINICAL DATA:  Central line placement  EXAM: PORTABLE CHEST - 1 VIEW  COMPARISON:  12/20/2013  FINDINGS: Right IJ catheter tip projects over the cavoatrial junction. Otherwise, no significant interval change. Hypoaeration with interstitial and vascular crowding. Mild linear left lung base opacity, favor atelectasis. Small effusions not excluded. No pneumothorax. Mild aortic tortuosity. Heart size upper normal  to mildly enlarged. No acute osseous finding.  IMPRESSION: Right IJ catheter tip projects over the cavoatrial junction. No pneumothorax.  Hypoaeration and mild left lung base opacity, favor atelectasis.   Electronically Signed   By: Jearld Lesch M.D.   On: 12/21/2013 05:22   Dg Chest Portable 1 View  12/21/2013   CLINICAL DATA:  Syncope.  EXAM: PORTABLE CHEST - 1 VIEW  COMPARISON:  None.  FINDINGS: Study is degraded by multiple overlapping wires and exclusion of the lateral left base.  No definitive cardiomegaly. Negative aortic contours. There is no edema, consolidation, effusion, or pneumothorax.  IMPRESSION: No evidence of acute disease.   Electronically Signed   By: Tiburcio Pea M.D.   On: 12/21/2013 00:31     CBC  Recent Labs Lab 12/20/13 2105 12/21/13 0115 12/21/13 0330 12/21/13 1025 12/21/13 1442 12/21/13 2231 12/22/13 0415 12/23/13 0909  WBC 7.4 7.1 9.4 10.0  --   --  13.3*  --   HGB 10.3* 9.8* 8.5* 9.0* 8.1* 7.1* 7.8* 6.9*  HCT 30.7* 28.9* 24.5* 25.9* 23.3* 20.4* 22.5* 20.4*  PLT 192 159 171 141*  --   --  124*  --   MCV 95.0 93.8 91.8 92.2  --   --  91.5  --   MCH 31.9 31.8 31.8 32.0  --   --  31.7  --   MCHC 33.6 33.9 34.7 34.7  --   --  34.7  --   RDW 14.0 14.0 14.1 15.3  --   --  15.4  --   LYMPHSABS 1.0  --  1.5 1.5  --   --   --   --   MONOABS 0.4  --  0.5 0.7  --   --    --   --   EOSABS 0.0  --  0.0 0.0  --   --   --   --   BASOSABS 0.0  --  0.0 0.0  --   --   --   --     Chemistries   Recent Labs Lab 12/20/13 2222 12/21/13 0115 12/21/13 0900 12/21/13 1442 12/21/13 2231 12/22/13 0400 12/22/13 1500 12/23/13 0600  NA 139 135*  --  138 140 139 137 138  K 4.5 5.3  --  5.9* 5.1 5.2 4.7 4.2  CL 103 102  --  109 111 110 107 107  CO2 16* 14*  --  17* 16* 19 20 22   GLUCOSE 157* 162*  --  162* 164* 147* 174* 121*  BUN 54* 57*  --  67* 64* 57* 46* 39*  CREATININE 2.36* 2.52*  --  2.45* 2.37* 2.05* 1.77* 1.76*  CALCIUM 8.7 8.3*  --  7.9* 8.3* 8.4 8.4 8.3*  MG  --  1.8 1.9  --   --   --   --   --   AST 24  --   --   --   --   --   --   --   ALT 21  --   --   --   --   --   --   --   ALKPHOS 34*  --   --   --   --   --   --   --   BILITOT 0.2*  --   --   --   --   --   --   --    ------------------------------------------------------------------------------------------------------------------ estimated creatinine clearance is 42.1 mL/min (by C-G formula based  on Cr of 1.76). ------------------------------------------------------------------------------------------------------------------ No results for input(s): HGBA1C in the last 72 hours. ------------------------------------------------------------------------------------------------------------------ No results for input(s): CHOL, HDL, LDLCALC, TRIG, CHOLHDL, LDLDIRECT in the last 72 hours. ------------------------------------------------------------------------------------------------------------------ No results for input(s): TSH, T4TOTAL, T3FREE, THYROIDAB in the last 72 hours.  Invalid input(s): FREET3 ------------------------------------------------------------------------------------------------------------------ No results for input(s): VITAMINB12, FOLATE, FERRITIN, TIBC, IRON, RETICCTPCT in the last 72 hours.  Coagulation profile  Recent Labs Lab 12/21/13 0115 12/22/13 0400  INR 1.47  1.32    No results for input(s): DDIMER in the last 72 hours.  Cardiac Enzymes  Recent Labs Lab 12/20/13 2222 12/21/13 0115 12/21/13 0900  TROPONINI <0.30 <0.30 <0.30   ------------------------------------------------------------------------------------------------------------------ Invalid input(s): POCBNP     Time Spent in minutes   35   Daril Warga K M.D on 12/23/2013 at 12:23 PM  Between 7am to 7pm - Pager - [phone]  After 7pm go to [email or web address] - password [email or web address]  And look for the night coverage person covering for me after hours  [facility] Office  [phone]

## 2013-12-23 NOTE — Progress Notes (Signed)
PULMONARY / CRITICAL CARE MEDICINE   Name: Drew [name] MRN: [id number] DOB: 1940-05-17    ADMISSION DATE:  12/20/2013 CONSULTATION DATE:  12/23/2013  REFERRING MD :  EDP  CHIEF COMPLAINT:  Lower GI Bleed  INITIAL PRESENTATION: 73 y.o. M [facility] after he had 3 episodes of hematochezia starting around 4pm same day.  In ED, pt had frank blood per rectum per EDP and pt was hypotensive with SBP in 70's - 80's.  PCCM consulted for admission.   STUDIES:  10/30 tagged red blood cell>>>pos jejunal?  SIGNIFICANT EVENTS: 10/29 - admitted with LGIB 10/30 - borderline BP, tag scan positive 10/31 Trx to floor 11/1  PCCM Signed off   SUBJECTIVE:  Comfortable flat, no abd pain, stools much darker from brb previously   VITAL SIGNS: Temp:  [97.8 F (36.6 C)-98.5 F (36.9 C)] 98 F (36.7 C) (11/01 0540) Pulse Rate:  [74-96] 74 (11/01 0540) Resp:  [18-26] 18 (11/01 0540) BP: (91-128)/(45-70) 100/54 mmHg (11/01 0540) SpO2:  [95 %-98 %] 97 % (11/01 0540) Weight:  [211 lb 10.3 oz (96 kg)] 211 lb 10.3 oz (96 kg) (11/01 0540) HEMODYNAMICS: CVP:  [6 mmHg] 6 mmHg VENTILATOR SETTINGS:   INTAKE / OUTPUT: Intake/Output      10/31 0701 - 11/01 0700 11/01 0701 - 11/02 0700   P.O. 240    I.V. (mL/kg) 80 (0.8)    Blood     Other     IV Piggyback     Total Intake(mL/kg) 320 (3.3)    Urine (mL/kg/hr) 1175 (0.5)    Stool 300 (0.1)    Blood 500 (0.2)    Total Output 1975     Net -1655          Stool Occurrence 1 x      PHYSICAL EXAMINATION: General: nad  Neuro: A&O x 3, non-focal.  HEENT: jvd down Cardiovascular: Tachy but regular, 2/6 SEM.  Lungs: CTA Abdomen: BS x 4, soft, ND, mild ruq pain Musculoskeletal: No gross deformities, no edema.  Skin: Intact, warm, no rashes.  LABS:  CBC  Recent Labs Lab 12/21/13 0330 12/21/13 1025 12/21/13 1442 12/21/13 2231 12/22/13 0415  WBC 9.4 10.0  --   --  13.3*  HGB 8.5* 9.0* 8.1* 7.1* 7.8*  HCT 24.5* 25.9* 23.3*  20.4* 22.5*  PLT 171 141*  --   --  124*   Coag's  Recent Labs Lab 12/21/13 0115 12/21/13 1442 12/22/13 0400  APTT  --  31 29  INR 1.47  --  1.32   BMET  Recent Labs Lab 12/22/13 0400 12/22/13 1500 12/23/13 0600  NA 139 137 138  K 5.2 4.7 4.2  CL 110 107 107  CO2 19 20 22   BUN 57* 46* 39*  CREATININE 2.05* 1.77* 1.76*  GLUCOSE 147* 174* 121*   Electrolytes  Recent Labs Lab 12/21/13 0115 12/21/13 0900  12/22/13 0400 12/22/13 1500 12/23/13 0600  CALCIUM 8.3*  --   < > 8.4 8.4 8.3*  MG 1.8 1.9  --   --   --   --   PHOS 2.8 2.5  --   --   --   --   < > = values in this interval not displayed. Sepsis Markers  Recent Labs Lab 12/21/13 0115 12/21/13 0900 12/21/13 1442  LATICACIDVEN 4.1* 2.3* 2.5*   ABG No results for input(s): PHART, PCO2ART, PO2ART in the last 168 hours. Liver Enzymes  Recent Labs Lab 12/20/13 2222  AST 24  ALT 21  ALKPHOS 34*  BILITOT 0.2*  ALBUMIN 2.8*   Cardiac Enzymes  Recent Labs Lab 12/20/13 2222 12/21/13 0115 12/21/13 0900  TROPONINI <0.30 <0.30 <0.30   Glucose  Recent Labs Lab 12/21/13 1958 12/22/13 0035 12/22/13 0449 12/22/13 0732 12/22/13 1123 12/22/13 1534  GLUCAP 148* 146* 138* 137* 142* 183*    Imaging No results found.   ASSESSMENT / PLAN:  UGIB now on floor under triad service  Lab Results  Component Value Date   HGB 7.8* 12/22/2013   HGB 7.1* 12/21/2013   HGB 8.1* 12/21/2013    Acute renal insuff plateaued/ hbp meds on hold   F/u per GI/ triad  We are available prn    Sandrea HughsMichael Judene Logue, MD Pulmonary and Critical Care Medicine [facility] Cell [phone] After 5:30 PM or weekends, call [phone]

## 2013-12-23 NOTE — Progress Notes (Signed)
Subjective: Had some maroon stools with clots over the past 24 hours.  Objective: Vital signs in last 24 hours: Temp:  [97.8 F (36.6 C)-99.2 F (37.3 C)] 99.2 F (37.3 C) (11/01 1245) Pulse Rate:  [70-96] 78 (11/01 1245) Resp:  [18-19] 18 (11/01 1245) BP: (91-128)/(45-70) 101/49 mmHg (11/01 1245) SpO2:  [90 %-98 %] 90 % (11/01 1245) Weight:  [96 kg (211 lb 10.3 oz)] 96 kg (211 lb 10.3 oz) (11/01 0540) Weight change: -2.6 kg (-5 lb 11.7 oz) Last BM Date: 12/22/13  PE: GEN:  Pale-appearing, alert, NAD ABD:  Soft, protuberant, non-tender  Lab Results: CBC    Component Value Date/Time   WBC 13.3* 12/22/2013 0415   RBC 2.46* 12/22/2013 0415   HGB 6.9* 12/23/2013 0909   HCT 20.4* 12/23/2013 0909   PLT 124* 12/22/2013 0415   MCV 91.5 12/22/2013 0415   MCH 31.7 12/22/2013 0415   MCHC 34.7 12/22/2013 0415   RDW 15.4 12/22/2013 0415   LYMPHSABS 1.5 12/21/2013 1025   MONOABS 0.7 12/21/2013 1025   EOSABS 0.0 12/21/2013 1025   BASOSABS 0.0 12/21/2013 1025   CMP     Component Value Date/Time   NA 138 12/23/2013 0600   K 4.2 12/23/2013 0600   CL 107 12/23/2013 0600   CO2 22 12/23/2013 0600   GLUCOSE 121* 12/23/2013 0600   BUN 39* 12/23/2013 0600   CREATININE 1.76* 12/23/2013 0600   CALCIUM 8.3* 12/23/2013 0600   PROT 5.4* 12/20/2013 2222   ALBUMIN 2.8* 12/20/2013 2222   AST 24 12/20/2013 2222   ALT 21 12/20/2013 2222   ALKPHOS 34* 12/20/2013 2222   BILITOT 0.2* 12/20/2013 2222   GFRNONAA 36* 12/23/2013 0600   GFRAA 42* 12/23/2013 0600   Assessment:  1.  GI bleeding, recurrent.  Enteroscopy 10/30 showed severe esophagitis and duodenal ulcers, but no focal bleeding site was identified; jejunum (area of presumed GI bleeding based on tagged RBC study) appeared normal.  Plan:  1.  Patient to receive more PRBCs today. 2.  Clear liquid diet only. 3.  Switch from oral to intravenous Pantoprazole (40 mg IV every 12 hours). 4.  If patient has persistent bleeding, would  consider repeating EGD/Enteroscopy tomorrow.  If this again shows no clear bleeding site, would consider colonoscopy to make sure there is not colonic source identified. 5.  Will follow.   Freddy JakschUTLAW,Jasmeen Fritsch M 12/23/2013, 2:18 PM

## 2013-12-24 ENCOUNTER — Encounter (HOSPITAL_COMMUNITY): Payer: Self-pay | Admitting: Gastroenterology

## 2013-12-24 DIAGNOSIS — I10 Essential (primary) hypertension: Secondary | ICD-10-CM

## 2013-12-24 LAB — TYPE AND SCREEN
ABO/RH(D): O NEG
ABO/RH(D): O NEG
Antibody Screen: NEGATIVE
Antibody Screen: NEGATIVE
UNIT DIVISION: 0
UNIT DIVISION: 0
UNIT DIVISION: 0
UNIT DIVISION: 0
Unit division: 0
Unit division: 0

## 2013-12-24 LAB — CBC
HEMATOCRIT: 23.7 % — AB (ref 39.0–52.0)
HEMOGLOBIN: 8 g/dL — AB (ref 13.0–17.0)
MCH: 31.4 pg (ref 26.0–34.0)
MCHC: 33.8 g/dL (ref 30.0–36.0)
MCV: 92.9 fL (ref 78.0–100.0)
Platelets: 123 10*3/uL — ABNORMAL LOW (ref 150–400)
RBC: 2.55 MIL/uL — ABNORMAL LOW (ref 4.22–5.81)
RDW: 17 % — ABNORMAL HIGH (ref 11.5–15.5)
WBC: 9.3 10*3/uL (ref 4.0–10.5)

## 2013-12-24 LAB — URINALYSIS, ROUTINE W REFLEX MICROSCOPIC
Bilirubin Urine: NEGATIVE
GLUCOSE, UA: NEGATIVE mg/dL
HGB URINE DIPSTICK: NEGATIVE
KETONES UR: NEGATIVE mg/dL
Leukocytes, UA: NEGATIVE
Nitrite: NEGATIVE
PH: 6.5 (ref 5.0–8.0)
Protein, ur: NEGATIVE mg/dL
Specific Gravity, Urine: 1.006 (ref 1.005–1.030)
Urobilinogen, UA: 2 mg/dL — ABNORMAL HIGH (ref 0.0–1.0)

## 2013-12-24 NOTE — Care Management Note (Signed)
  Page 1 of 1   12/28/2013     8:35:44 AM CARE MANAGEMENT NOTE 12/28/2013  Patient:  Drew [name],Drew [name]   Account Number:  [id number]  Date Initiated:  12/24/2013  Documentation initiated by:  Ronny FlurryWILE,Zahir Eisenhour  Subjective/Objective Assessment:     Action/Plan:   Anticipated DC Date:     Anticipated DC Plan:  HOME/SELF CARE         Choice offered to / List presented to:             Status of service:   Medicare Important Message given?  YES (If response is "NO", the following Medicare IM given date fields will be blank) Date Medicare IM given:  12/24/2013 Medicare IM given by:  Ronny FlurryWILE,Violet Cart Date Additional Medicare IM given:  12/28/2013 Additional Medicare IM given by:  Ronny FlurryHEATHER Kirrah Mustin  Discharge Disposition:    Per UR Regulation:    If discussed at Long Length of Stay Meetings, dates discussed:   12/25/2013  12/27/2013    Comments:

## 2013-12-24 NOTE — Evaluation (Signed)
Physical Therapy Evaluation Patient Details Name: Drew DownsJohn C [name] MRN: [id number] DOB: 08-01-1940 Today's Date: 12/24/2013   History of Present Illness  Adm 12/20/13 with GI bleed. PMHx-HTN  Clinical Impression  Patient evaluated by Physical Therapy with no further acute PT needs identified. All education has been completed and the patient has no further questions.  See below for any follow-up Physial Therapy or equipment needs. PT is signing off. Thank you for this referral.     Follow Up Recommendations No PT follow up;Supervision - Intermittent    Equipment Recommendations  None recommended by PT    Recommendations for Other Services       Precautions / Restrictions Precautions Precautions: None      Mobility  Bed Mobility               General bed mobility comments: up in chair on arrival  Transfers Overall transfer level: Independent Equipment used: None                Ambulation/Gait Ambulation/Gait assistance: Supervision Ambulation Distance (Feet): 15 Feet Assistive device: None Gait Pattern/deviations: Step-through pattern;Decreased stride length     General Gait Details: WFL; reports he just walked in hallway with nursign  Stairs            Wheelchair Mobility    Modified Rankin (Stroke Patients Only)       Balance                                 Standardized Balance Assessment Standardized Balance Assessment : Berg Balance Test Berg Balance Test Sit to Stand: Able to stand  independently using hands Standing Unsupported: Able to stand safely 2 minutes Sitting with Back Unsupported but Feet Supported on Floor or Stool: Able to sit safely and securely 2 minutes Stand to Sit: Sits safely with minimal use of hands Transfers: Able to transfer safely, minor use of hands Standing Unsupported with Eyes Closed: Able to stand 10 seconds safely Standing Ubsupported with Feet Together: Able to place feet together  independently and stand for 1 minute with supervision From Standing, Reach Forward with Outstretched Arm: Can reach forward >12 cm safely (5") From Standing Position, Pick up Object from Floor: Able to pick up shoe safely and easily From Standing Position, Turn to Look Behind Over each Shoulder: Looks behind from both sides and weight shifts well Turn 360 Degrees: Able to turn 360 degrees safely but slowly Standing Unsupported, Alternately Place Feet on Step/Stool: Able to complete >2 steps/needs minimal assist Standing Unsupported, One Foot in Front: Able to plae foot ahead of the other independently and hold 30 seconds Standing on One Leg: Tries to lift leg/unable to hold 3 seconds but remains standing independently Total Score: 44         Pertinent Vitals/Pain Pain Assessment: No/denies pain    Home Living Family/patient expects to be discharged to:: Private residence Living Arrangements: Spouse/significant other;Children (son unemployed) Available Help at Discharge: Family;Available 24 hours/day Type of Home: House Home Access: Stairs to enter Entrance Stairs-Rails: None Entrance Stairs-Number of Steps: 3 Home Layout: One level Home Equipment: Cane - single point      Prior Function Level of Independence: Independent               Hand Dominance   Dominant Hand: Left    Extremity/Trunk Assessment   Upper Extremity Assessment: Overall WFL for tasks assessed  Lower Extremity Assessment: Generalized weakness (4/5 knee extension; 3+/5 knee flexion)      Cervical / Trunk Assessment: Normal  Communication   Communication: No difficulties  Cognition Arousal/Alertness: Awake/alert Behavior During Therapy: WFL for tasks assessed/performed Overall Cognitive Status: Within Functional Limits for tasks assessed                      General Comments General comments (skin integrity, edema, etc.): Discussed having wife provide supervision when he  showers at first, and to avoid hot shower as this might make him light-headed.    Exercises        Assessment/Plan    PT Assessment Patent does not need any further PT services  PT Diagnosis Generalized weakness   PT Problem List    PT Treatment Interventions     PT Goals (Current goals can be found in the Care Plan section) Acute Rehab PT Goals PT Goal Formulation: All assessment and education complete, DC therapy    Frequency     Barriers to discharge        Co-evaluation               End of Session Equipment Utilized During Treatment: Gait belt Activity Tolerance: Patient tolerated treatment well Patient left: in chair;with call bell/phone within reach;with family/visitor present Nurse Communication: Mobility status (no PT needs)         Time: [id number] PT Time Calculation (min): 22 min   Charges:   PT Evaluation $Initial PT Evaluation Tier I: 1 Procedure     PT G Codes:          Drew [name] 12/24/2013, 12:21 PM Pager [phone]

## 2013-12-24 NOTE — Clinical Documentation Improvement (Signed)
Risk Factors: Profound hypotension, tachycardia noted per 10/30 progress notes.  Labs: Lactic acid: 10/30:  2.3. 10/30:  4.1. 10/29:  6.51.   Potential Diagnosis?  . Bacteremia (positive blood cultures only) . Sepsis-specify causative organism if known . Sepsis due to: --Device --Implant --Graft . Severe sepsis-sepsis with organ dysfunction --Specify organ dysfunction Respiratory failure Encephalopathy Acute kidney failure Other (specify) . SIRS (Systemic Inflammatory Response Syndrome --With or without organ dysfunction . Document septic shock if present . Document any associated diagnoses/conditions    Thank Drew [name],   Drew Monsanto Mathews-Bethea,Drew [name],Drew [name],Clinical Documentation Specialist [phone] [name]

## 2013-12-24 NOTE — Progress Notes (Signed)
[facility] Progress Note  Subjective: Patient states no stools since Saturday night  Objective: Vital signs in last 24 hours: Temp:  [97.8 F (36.6 C)-99.6 F (37.6 C)] 97.8 F (36.6 C) (11/02 0616) Pulse Rate:  [70-92] 73 (11/02 0616) Resp:  [16-18] 18 (11/02 0616) BP: (83-124)/(44-62) 107/62 mmHg (11/02 0616) SpO2:  [90 %-96 %] 96 % (11/02 0616) Weight change:    IO:NGEXBMWPE:abdomen soft nondistended  Lab Results: Results for orders placed or performed during the hospital encounter of 12/20/13 (from the past 24 hour(s))  Hemoglobin and hematocrit, blood     Status: Abnormal   Collection Time: 12/23/13  9:09 AM  Result Value Ref Range   Hemoglobin 6.9 (LL) 13.0 - 17.0 g/dL   HCT 41.320.4 (L) 24.439.0 - 01.052.0 %  CBC     Status: Abnormal   Collection Time: 12/24/13  4:07 AM  Result Value Ref Range   WBC 9.3 4.0 - 10.5 K/uL   RBC 2.55 (L) 4.22 - 5.81 MIL/uL   Hemoglobin 8.0 (L) 13.0 - 17.0 g/dL   HCT 27.223.7 (L) 53.639.0 - 64.452.0 %   MCV 92.9 78.0 - 100.0 fL   MCH 31.4 26.0 - 34.0 pg   MCHC 33.8 30.0 - 36.0 g/dL   RDW 03.417.0 (H) 74.211.5 - 59.515.5 %   Platelets 123 (L) 150 - 400 K/uL    Studies/Results: No results found.    Assessment: GI bleed, proximal jejunal source suggested by previous nuclear medicine RBC scan, significant esophagitis and nonbleeding duodenal ulcers seen on EGD.  Plan: I think repeating EGD at this point would likely be low yield. Will monitor stools and hemoglobin today and reconsider tomorrow versus possible repeat pulled RBC scan versus colonoscopy. Continue PPI for now    Ivet Guerrieri,Gaylon C 12/24/2013, 7:50 AM

## 2013-12-24 NOTE — Progress Notes (Signed)
PATIENT DETAILS Name: Drew DownsJohn C Latulippe Age: 73 y.o. Sex: male Date of Birth: 1941-01-11 Admit Date: 12/20/2013 Admitting Physician Leslye Peerobert S Byrum, MD PCP:No primary care provider on file.  Brief Summary: [age]yr old man H/O gout, dyslipidemia, HTN, who was on ASA-NSADIS and Bactrim (did not know why), was admitted by PCCM 3 days for blood in stool, got 1 unit PRBC, seen by GI, placed on IV PPI, EGD and Tagged RBC scan suggest Jujenal bleed and gastritis, [facility]  Subjective: No complaints. Last BM yesterday-no blood.  Assessment/Plan: Active Problems:   Suspected Upper GI bleed:admitted by PCCM, as patient was hypotensive. Given 3 units of PRBC so far. Seen by GI,underwent EGD on10/30 which showed a severely ulcerated GE junction, and 2 medium sized ulcers in duodenum. No active bleeding note on EGD. Tagged RBC scan done on 10/30 showed possible proximal jejunal bleed.Maintained on PPI,no overt bleeding for the past 48 hours. We will continue to monitor and transfuse accordingly. Patient has been asked to avoid NSAIDs in the future.  Hypovolemic shock: secondary to above. Resolved with IV fluids and PRBC transfusion  Acute blood loss anemia: secondary to presumed upper GI bleeding. Transfused a total of 3 units so far. Follow hemoglobin  Acute on chronic kidney disease stage III: suspected ARF secondary to above. 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  Hypertension:controlled with Norvasc.Continue to hold ARB  Dyslipidemia: continue Niacin & Zetia.  Gout:stable on Allopurinol.  Disposition: Remain inpatient  Antibiotics:  None  DVT Prophylaxis:  SCD's  Code Status: Full code   Family Communication Spouse at bedside  Procedures:  EGD 10/30  Tagged RBC scan 10/30  CONSULTS:  pulmonary/intensive care and GI  MEDICATIONS: Scheduled Meds: . allopurinol  200 mg Oral BH-q7a  .  amLODipine  10 mg Oral Daily  . ezetimibe  10 mg Oral QPM  . niacin  1,000 mg Oral QHS  . pantoprazole (PROTONIX) IV  40 mg Intravenous Q12H  . sucralfate  1 g Oral TID WC & HS   Continuous Infusions:  PRN Meds:.diphenhydrAMINE, ondansetron (ZOFRAN) IV, sodium chloride  Antibiotics: Anti-infectives    Start     Dose/Rate Route Frequency Ordered Stop   12/22/13 1644  sulfamethoxazole-trimethoprim (BACTRIM DS) 800-160 MG per tablet 1 tablet  Status:  Discontinued     1 tablet Oral 2 times daily 12/22/13 1644 12/23/13 1221       PHYSICAL EXAM: Vital signs in last 24 hours: Filed Vitals:   12/23/13 2145 12/24/13 0136 12/24/13 0616 12/24/13 0900  BP: 105/55 110/60 107/62   Pulse: 74 70 73   Temp: 99.6 F (37.6 C) 99.2 F (37.3 C) 97.8 F (36.6 C)   TempSrc: Oral Oral Oral   Resp: 18 18 18    Height:      Weight:    100.245 kg (221 lb)  SpO2: 93% 94% 96%     Weight change:  Filed Weights   12/22/13 0600 12/23/13 0540 12/24/13 0900  Weight: 98.6 kg (217 lb 6 oz) 96 kg (211 lb 10.3 oz) 100.245 kg (221 lb)   Body mass index is 32.62 kg/(m^2).   Gen Exam: Awake and alert with clear speech.  Neck: Supple, No JVD.   Chest: B/L Clear.   CVS: S1 S2 Regular, no murmurs.  Abdomen: soft, BS +, non tender, non distended.  Extremities: no edema, lower extremities warm to touch. Neurologic: Non Focal.  Skin: No Rash.   Wounds: N/A.  Intake/Output from previous day:  Intake/Output Summary (Last 24 hours) at 12/24/13 1201 Last data filed at 12/24/13 1015  Gross per 24 hour  Intake    910 ml  Output   1500 ml  Net   -590 ml     LAB RESULTS: CBC  Recent Labs Lab 12/20/13 2105 12/21/13 0115 12/21/13 0330 12/21/13 1025 12/21/13 1442 12/21/13 2231 12/22/13 0415 12/23/13 0909 12/24/13 0407  WBC 7.4 7.1 9.4 10.0  --   --  13.3*  --  9.3  HGB 10.3* 9.8* 8.5* 9.0* 8.1* 7.1* 7.8* 6.9* 8.0*  HCT 30.7* 28.9* 24.5* 25.9* 23.3* 20.4* 22.5* 20.4* 23.7*  PLT 192 159 171 141*   --   --  124*  --  123*  MCV 95.0 93.8 91.8 92.2  --   --  91.5  --  92.9  MCH 31.9 31.8 31.8 32.0  --   --  31.7  --  31.4  MCHC 33.6 33.9 34.7 34.7  --   --  34.7  --  33.8  RDW 14.0 14.0 14.1 15.3  --   --  15.4  --  17.0*  LYMPHSABS 1.0  --  1.5 1.5  --   --   --   --   --   MONOABS 0.4  --  0.5 0.7  --   --   --   --   --   EOSABS 0.0  --  0.0 0.0  --   --   --   --   --   BASOSABS 0.0  --  0.0 0.0  --   --   --   --   --     Chemistries   Recent Labs Lab 12/21/13 0115 12/21/13 0900 12/21/13 1442 12/21/13 2231 12/22/13 0400 12/22/13 1500 12/23/13 0600  NA 135*  --  138 140 139 137 138  K 5.3  --  5.9* 5.1 5.2 4.7 4.2  CL 102  --  109 111 110 107 107  CO2 14*  --  17* 16* 19 20 22   GLUCOSE 162*  --  162* 164* 147* 174* 121*  BUN 57*  --  67* 64* 57* 46* 39*  CREATININE 2.52*  --  2.45* 2.37* 2.05* 1.77* 1.76*  CALCIUM 8.3*  --  7.9* 8.3* 8.4 8.4 8.3*  MG 1.8 1.9  --   --   --   --   --     CBG:  Recent Labs Lab 12/22/13 0035 12/22/13 0449 12/22/13 0732 12/22/13 1123 12/22/13 1534  GLUCAP 146* 138* 137* 142* 183*    GFR Estimated Creatinine Clearance: 43 mL/min (by C-G formula based on Cr of 1.76).  Coagulation profile  Recent Labs Lab 12/21/13 0115 12/22/13 0400  INR 1.47 1.32    Cardiac Enzymes  Recent Labs Lab 12/20/13 2222 12/21/13 0115 12/21/13 0900  TROPONINI <0.30 <0.30 <0.30    Invalid input(s): POCBNP No results for input(s): DDIMER in the last 72 hours. No results for input(s): HGBA1C in the last 72 hours. No results for input(s): CHOL, HDL, LDLCALC, TRIG, CHOLHDL, LDLDIRECT in the last 72 hours. No results for input(s): TSH, T4TOTAL, T3FREE, THYROIDAB in the last 72 hours.  Invalid input(s): FREET3 No results for input(s): VITAMINB12, FOLATE, FERRITIN, TIBC, IRON, RETICCTPCT in the last 72 hours. No results for input(s): LIPASE, AMYLASE in the last 72 hours.  Urine Studies No results for input(s): UHGB, CRYS in the last  72  hours.  Invalid input(s): UACOL, UAPR, USPG, UPH, UTP, UGL, UKET, UBIL, UNIT, UROB, ULEU, UEPI, UWBC, URBC, UBAC, CAST, UCOM, BILUA  MICROBIOLOGY: Recent Results (from the past 240 hour(s))  MRSA PCR Screening     Status: None   Collection Time: 12/21/13 12:21 AM  Result Value Ref Range Status   MRSA by PCR NEGATIVE NEGATIVE Final    Comment:        The GeneXpert MRSA Assay (FDA approved for NASAL specimens only), is one component of a comprehensive MRSA colonization surveillance program. It is not intended to diagnose MRSA infection nor to guide or monitor treatment for MRSA infections.    RADIOLOGY STUDIES/RESULTS: Nm Gi Blood Loss  12/21/2013   ADDENDUM REPORT: 12/21/2013 10:17  ADDENDUM: Critical Value/emergent results were called by telephone at the time of interpretation on 12/21/2013 at 9:45 Am to Dr. Tyson Alias, who verbally acknowledged these results.   Electronically Signed   By: Charlett Nose M.D.   On: 12/21/2013 10:17   12/21/2013   CLINICAL DATA:  Acute GI bleed.  Abdominal pain.  EXAM: NUCLEAR MEDICINE GASTROINTESTINAL BLEEDING SCAN  TECHNIQUE: Sequential abdominal images were obtained following intravenous administration of Tc-68m labeled red blood cells.  RADIOPHARMACEUTICALS:  25.0 mCi Tc-31m in-vitro labeled red cells.  COMPARISON:  None.  FINDINGS: Abnormal activity is seen within a left abdominal small bowel loops within the first hr. Continuation of the study into second are demonstrates this is definitively within small bowel. Findings most compatible with a proximal/jejunal GI bleed.  IMPRESSION: Radiotracer accumulation within left abdominal small bowel loops most compatible with proximal/jejunal GI bleed.  Electronically Signed: By: Charlett Nose M.D. On: 12/21/2013 09:29   Dg Chest Port 1 View  12/21/2013   CLINICAL DATA:  Central line placement  EXAM: PORTABLE CHEST - 1 VIEW  COMPARISON:  12/20/2013  FINDINGS: Right IJ catheter tip projects over the cavoatrial  junction. Otherwise, no significant interval change. Hypoaeration with interstitial and vascular crowding. Mild linear left lung base opacity, favor atelectasis. Small effusions not excluded. No pneumothorax. Mild aortic tortuosity. Heart size upper normal to mildly enlarged. No acute osseous finding.  IMPRESSION: Right IJ catheter tip projects over the cavoatrial junction. No pneumothorax.  Hypoaeration and mild left lung base opacity, favor atelectasis.   Electronically Signed   By: Jearld Lesch M.D.   On: 12/21/2013 05:22   Dg Chest Portable 1 View  12/21/2013   CLINICAL DATA:  Syncope.  EXAM: PORTABLE CHEST - 1 VIEW  COMPARISON:  None.  FINDINGS: Study is degraded by multiple overlapping wires and exclusion of the lateral left base.  No definitive cardiomegaly. Negative aortic contours. There is no edema, consolidation, effusion, or pneumothorax.  IMPRESSION: No evidence of acute disease.   Electronically Signed   By: Tiburcio Pea M.D.   On: 12/21/2013 00:31    Jeoffrey Massed, MD  [facility] Pager:[phone]  If 7PM-7AM, please contact night-coverage [email or web address] Password [email or web address] 12/24/2013, 12:01 PM   LOS: 4 days

## 2013-12-24 NOTE — Plan of Care (Signed)
Problem: Phase III Progression Outcomes Goal: Activity at appropriate level-compared to baseline (UP IN CHAIR FOR HEMODIALYSIS)  Outcome: Progressing     

## 2013-12-25 LAB — CBC
HEMATOCRIT: 25.1 % — AB (ref 39.0–52.0)
HEMOGLOBIN: 8.4 g/dL — AB (ref 13.0–17.0)
MCH: 31 pg (ref 26.0–34.0)
MCHC: 33.5 g/dL (ref 30.0–36.0)
MCV: 92.6 fL (ref 78.0–100.0)
Platelets: 136 10*3/uL — ABNORMAL LOW (ref 150–400)
RBC: 2.71 MIL/uL — ABNORMAL LOW (ref 4.22–5.81)
RDW: 18 % — AB (ref 11.5–15.5)
WBC: 8.2 10*3/uL (ref 4.0–10.5)

## 2013-12-25 LAB — CBC WITH DIFFERENTIAL/PLATELET
BASOS PCT: 1 % (ref 0–1)
Basophils Absolute: 0.1 10*3/uL (ref 0.0–0.1)
EOS ABS: 0.4 10*3/uL (ref 0.0–0.7)
EOS PCT: 4 % (ref 0–5)
HCT: 27.3 % — ABNORMAL LOW (ref 39.0–52.0)
Hemoglobin: 9.2 g/dL — ABNORMAL LOW (ref 13.0–17.0)
LYMPHS ABS: 2.5 10*3/uL (ref 0.7–4.0)
Lymphocytes Relative: 28 % (ref 12–46)
MCH: 31.8 pg (ref 26.0–34.0)
MCHC: 33.7 g/dL (ref 30.0–36.0)
MCV: 94.5 fL (ref 78.0–100.0)
MONO ABS: 0.5 10*3/uL (ref 0.1–1.0)
Monocytes Relative: 6 % (ref 3–12)
NEUTROS ABS: 5.6 10*3/uL (ref 1.7–7.7)
NEUTROS PCT: 61 % (ref 43–77)
Platelets: 160 10*3/uL (ref 150–400)
RBC: 2.89 MIL/uL — AB (ref 4.22–5.81)
RDW: 17.9 % — ABNORMAL HIGH (ref 11.5–15.5)
WBC: 9.1 10*3/uL (ref 4.0–10.5)

## 2013-12-25 LAB — BASIC METABOLIC PANEL
Anion gap: 10 (ref 5–15)
BUN: 22 mg/dL (ref 6–23)
CHLORIDE: 105 meq/L (ref 96–112)
CO2: 24 mEq/L (ref 19–32)
CREATININE: 1.63 mg/dL — AB (ref 0.50–1.35)
Calcium: 8.4 mg/dL (ref 8.4–10.5)
GFR calc non Af Amer: 40 mL/min — ABNORMAL LOW (ref >90)
GFR, EST AFRICAN AMERICAN: 46 mL/min — AB (ref >90)
GLUCOSE: 109 mg/dL — AB (ref 70–99)
Potassium: 3.7 mEq/L (ref 3.7–5.3)
Sodium: 139 mEq/L (ref 137–147)

## 2013-12-25 NOTE — Progress Notes (Signed)
[facility] Progress Note  Subjective: Patient feeling good, one black stool with a little bit of dark red appearance, tolerating diet  Objective: Vital signs in last 24 hours: Temp:  [98.1 F (36.7 C)-99.5 F (37.5 C)] 98.3 F (36.8 C) (11/03 0522) Pulse Rate:  [72-89] 72 (11/03 0522) Resp:  [16-18] 18 (11/03 0522) BP: (111-119)/(68-88) 111/68 mmHg (11/03 0522) SpO2:  [93 %-98 %] 95 % (11/03 0522) Weight:  [96.1 kg (211 lb 13.8 oz)-100.245 kg (221 lb)] 96.1 kg (211 lb 13.8 oz) (11/03 0522) Weight change:    OZ:HYQMVHQIOPE:unchanged  Lab Results: Results for orders placed or performed during the hospital encounter of 12/20/13 (from the past 24 hour(s))  Urinalysis, Routine w reflex microscopic     Status: Abnormal   Collection Time: 12/24/13  4:07 PM  Result Value Ref Range   Color, Urine YELLOW YELLOW   APPearance CLEAR CLEAR   Specific Gravity, Urine 1.006 1.005 - 1.030   pH 6.5 5.0 - 8.0   Glucose, UA NEGATIVE NEGATIVE mg/dL   Hgb urine dipstick NEGATIVE NEGATIVE   Bilirubin Urine NEGATIVE NEGATIVE   Ketones, ur NEGATIVE NEGATIVE mg/dL   Protein, ur NEGATIVE NEGATIVE mg/dL   Urobilinogen, UA 2.0 (H) 0.0 - 1.0 mg/dL   Nitrite NEGATIVE NEGATIVE   Leukocytes, UA NEGATIVE NEGATIVE  CBC     Status: Abnormal   Collection Time: 12/25/13  4:16 AM  Result Value Ref Range   WBC 8.2 4.0 - 10.5 K/uL   RBC 2.71 (L) 4.22 - 5.81 MIL/uL   Hemoglobin 8.4 (L) 13.0 - 17.0 g/dL   HCT 96.225.1 (L) 95.239.0 - 84.152.0 %   MCV 92.6 78.0 - 100.0 fL   MCH 31.0 26.0 - 34.0 pg   MCHC 33.5 30.0 - 36.0 g/dL   RDW 32.418.0 (H) 40.111.5 - 02.715.5 %   Platelets 136 (L) 150 - 400 K/uL  Basic metabolic panel     Status: Abnormal   Collection Time: 12/25/13  4:16 AM  Result Value Ref Range   Sodium 139 137 - 147 mEq/L   Potassium 3.7 3.7 - 5.3 mEq/L   Chloride 105 96 - 112 mEq/L   CO2 24 19 - 32 mEq/L   Glucose, Bld 109 (H) 70 - 99 mg/dL   BUN 22 6 - 23 mg/dL   Creatinine, Ser 2.531.63 (H) 0.50 - 1.35 mg/dL   Calcium 8.4 8.4 - 66.410.5 mg/dL   GFR calc non Af Amer 40 (L) >90 mL/min   GFR calc Af Amer 46 (L) >90 mL/min   Anion gap 10 5 - 15    Studies/Results: No results found.    Assessment: 1. GI bleed source unclear, esophagitis, peptic ulcer versus possible jejunal bleed   Plan: Since hemoglobin rising, bleeding seems to be slowing, will continue PPI, hold off further studies for now. Last colonoscopy was 3 years ago.we'll monitor stools and hemoglobin, if bleeding appears to have stopped, he could possibly go home tomorrow    Areanna Gengler,Marko C 12/25/2013, 8:10 AM

## 2013-12-25 NOTE — Progress Notes (Signed)
PATIENT DETAILS Name: Drew [name] Age: 73 y.o. Sex: male Date of Birth: [birth year] Admit Date: 12/20/2013 Admitting Physician Leslye Peer, MD PCP:No primary care provider on file.  Brief Summary: 73yr old man H/O gout, dyslipidemia, HTN, who was on ASA-NSADIS and Bactrim (did not know why), was admitted by PCCM 3 days for blood in stool, got 1 unit PRBC, seen by GI, placed on IV PPI, EGD and Tagged RBC scan suggest Jujenal bleed and gastritis, transferred to [facility] on 12/23/13  Subjective: No major issues overnight. One brown and one black stools with some ?blood today.  Assessment/Plan: Active Problems: Presumed Upper GI bleed:admitted by PCCM, as patient was hypotensive. Given 3 units of PRBC so far. Seen by GI,underwent EGD on10/30 which showed a severely ulcerated GE junction, and 2 medium sized ulcers in duodenum. No active bleeding note on EGD. Tagged RBC scan done on 10/30 showed possible proximal jejunal bleed.Maintained on PPI, not sure if current black stools are ongoing bleeding, with Hb improving, suspect old blood coming out at this time. Spoke with Dr Madilyn Fireman, monitor for another 24 hours for any further t bleeding. Patient has been asked to avoid NSAIDs in the future.  Hypovolemic shock: secondary to above. Resolved with IV fluids and PRBC transfusion  Acute blood loss anemia: secondary to presumed upper GI bleeding. Transfused a total of 3 units so far. Follow hemoglobin, however stable and actually increasing.  Acute on chronic kidney disease stage III: suspected ARF secondary to above. 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  Hypertension:controlled with Norvasc.Continue to hold ARB  Dyslipidemia: continue Niacin & Zetia.  Gout:stable on Allopurinol.  Disposition: Remain inpatient  Antibiotics:  None  DVT Prophylaxis:  SCD's  Code Status: Full code   Family  Communication Spouse at bedside  Procedures:  EGD 10/30  Tagged RBC scan 10/30  CONSULTS:  pulmonary/intensive care and GI  MEDICATIONS: Scheduled Meds: . allopurinol  200 mg Oral BH-q7a  . amLODipine  10 mg Oral Daily  . ezetimibe  10 mg Oral QPM  . niacin  1,000 mg Oral QHS  . pantoprazole (PROTONIX) IV  40 mg Intravenous Q12H  . sucralfate  1 g Oral TID WC & HS   Continuous Infusions:  PRN Meds:.diphenhydrAMINE, ondansetron (ZOFRAN) IV, sodium chloride  Antibiotics: Anti-infectives    Start     Dose/Rate Route Frequency Ordered Stop   12/22/13 1644  sulfamethoxazole-trimethoprim (BACTRIM DS) 800-160 MG per tablet 1 tablet  Status:  Discontinued     1 tablet Oral 2 times daily 12/22/13 1644 12/23/13 1221       PHYSICAL EXAM: Vital signs in last 24 hours: Filed Vitals:   12/24/13 0900 12/24/13 1222 12/24/13 2123 12/25/13 0522  BP:  119/69 113/88 111/68  Pulse:  89 86 72  Temp:  98.1 F (36.7 C) 99.5 F (37.5 C) 98.3 F (36.8 C)  TempSrc:  Oral Oral Oral  Resp:  18 16 18   Height:      Weight: 100.245 kg (221 lb)   96.1 kg (211 lb 13.8 oz)  SpO2:  98% 93% 95%    Weight change:  Filed Weights   12/23/13 0540 12/24/13 0900 12/25/13 0522  Weight: 96 kg (211 lb 10.3 oz) 100.245 kg (221 lb) 96.1 kg (211 lb 13.8 oz)   Body mass index is 31.27 kg/(m^2).   Gen Exam: Awake and alert with clear speech.  Neck: Supple, No JVD.   Chest: B/L Clear.  No rhonchi CVS: S1 S2 Regular, no murmurs.  Abdomen: soft, BS +, non tender, non distended.  Extremities: no edema, lower extremities warm to touch. Neurologic: Non Focal.   Skin: No Rash.   Wounds: N/A.  Intake/Output from previous day:  Intake/Output Summary (Last 24 hours) at 12/25/13 1339 Last data filed at 12/25/13 0950  Gross per 24 hour  Intake   1140 ml  Output   1925 ml  Net   -785 ml     LAB RESULTS: CBC  Recent Labs Lab 12/20/13 2105  12/21/13 0330 12/21/13 1025  12/22/13 0415  12/23/13 0909 12/24/13 0407 12/25/13 0416 12/25/13 1000  WBC 7.4  < > 9.4 10.0  --  13.3*  --  9.3 8.2 9.1  HGB 10.3*  < > 8.5* 9.0*  < > 7.8* 6.9* 8.0* 8.4* 9.2*  HCT 30.7*  < > 24.5* 25.9*  < > 22.5* 20.4* 23.7* 25.1* 27.3*  PLT 192  < > 171 141*  --  124*  --  123* 136* 160  MCV 95.0  < > 91.8 92.2  --  91.5  --  92.9 92.6 94.5  MCH 31.9  < > 31.8 32.0  --  31.7  --  31.4 31.0 31.8  MCHC 33.6  < > 34.7 34.7  --  34.7  --  33.8 33.5 33.7  RDW 14.0  < > 14.1 15.3  --  15.4  --  17.0* 18.0* 17.9*  LYMPHSABS 1.0  --  1.5 1.5  --   --   --   --   --  2.5  MONOABS 0.4  --  0.5 0.7  --   --   --   --   --  0.5  EOSABS 0.0  --  0.0 0.0  --   --   --   --   --  0.4  BASOSABS 0.0  --  0.0 0.0  --   --   --   --   --  0.1  < > = values in this interval not displayed.  Chemistries   Recent Labs Lab 12/21/13 0115 12/21/13 0900  12/21/13 2231 12/22/13 0400 12/22/13 1500 12/23/13 0600 12/25/13 0416  NA 135*  --   < > 140 139 137 138 139  K 5.3  --   < > 5.1 5.2 4.7 4.2 3.7  CL 102  --   < > 111 110 107 107 105  CO2 14*  --   < > 16* 19 20 22 24   GLUCOSE 162*  --   < > 164* 147* 174* 121* 109*  BUN 57*  --   < > 64* 57* 46* 39* 22  CREATININE 2.52*  --   < > 2.37* 2.05* 1.77* 1.76* 1.63*  CALCIUM 8.3*  --   < > 8.3* 8.4 8.4 8.3* 8.4  MG 1.8 1.9  --   --   --   --   --   --   < > = values in this interval not displayed.  CBG:  Recent Labs Lab 12/22/13 0035 12/22/13 0449 12/22/13 0732 12/22/13 1123 12/22/13 1534  GLUCAP 146* 138* 137* 142* 183*    GFR Estimated Creatinine Clearance: 45.5 mL/min (by C-G formula based on Cr of 1.63).  Coagulation profile  Recent Labs Lab 12/21/13 0115 12/22/13 0400  INR 1.47 1.32    Cardiac Enzymes  Recent Labs Lab 12/20/13 2222 12/21/13  0115 12/21/13 0900  TROPONINI <0.30 <0.30 <0.30    Invalid input(s): POCBNP No results for input(s): DDIMER in the last 72 hours. No results for input(s): HGBA1C in the last 72 hours. No  results for input(s): CHOL, HDL, LDLCALC, TRIG, CHOLHDL, LDLDIRECT in the last 72 hours. No results for input(s): TSH, T4TOTAL, T3FREE, THYROIDAB in the last 72 hours.  Invalid input(s): FREET3 No results for input(s): VITAMINB12, FOLATE, FERRITIN, TIBC, IRON, RETICCTPCT in the last 72 hours. No results for input(s): LIPASE, AMYLASE in the last 72 hours.  Urine Studies No results for input(s): UHGB, CRYS in the last 72 hours.  Invalid input(s): UACOL, UAPR, USPG, UPH, UTP, UGL, UKET, UBIL, UNIT, UROB, ULEU, UEPI, UWBC, URBC, UBAC, CAST, UCOM, BILUA  MICROBIOLOGY: Recent Results (from the past 240 hour(s))  MRSA PCR Screening     Status: None   Collection Time: 12/21/13 12:21 AM  Result Value Ref Range Status   MRSA by PCR NEGATIVE NEGATIVE Final    Comment:        The GeneXpert MRSA Assay (FDA approved for NASAL specimens only), is one component of a comprehensive MRSA colonization surveillance program. It is not intended to diagnose MRSA infection nor to guide or monitor treatment for MRSA infections.    RADIOLOGY STUDIES/RESULTS: Nm Gi Blood Loss  12/21/2013   ADDENDUM REPORT: 12/21/2013 10:17  ADDENDUM: Critical Value/emergent results were called by telephone at the time of interpretation on 12/21/2013 at 9:45 Am to Dr. Tyson AliasFEINSTEIN, who verbally acknowledged these results.   Electronically Signed   By: Charlett NoseKevin  Dover M.D.   On: 12/21/2013 10:17   12/21/2013   CLINICAL DATA:  Acute GI bleed.  Abdominal pain.  EXAM: NUCLEAR MEDICINE GASTROINTESTINAL BLEEDING SCAN  TECHNIQUE: Sequential abdominal images were obtained following intravenous administration of Tc-9376m labeled red blood cells.  RADIOPHARMACEUTICALS:  25.0 mCi Tc-8476m in-vitro labeled red cells.  COMPARISON:  None.  FINDINGS: Abnormal activity is seen within a left abdominal small bowel loops within the first hr. Continuation of the study into second are demonstrates this is definitively within small bowel. Findings most  compatible with a proximal/jejunal GI bleed.  IMPRESSION: Radiotracer accumulation within left abdominal small bowel loops most compatible with proximal/jejunal GI bleed.  Electronically Signed: By: Charlett NoseKevin  Dover M.D. On: 12/21/2013 09:29   Dg Chest Port 1 View  12/21/2013   CLINICAL DATA:  Central line placement  EXAM: PORTABLE CHEST - 1 VIEW  COMPARISON:  12/20/2013  FINDINGS: Right IJ catheter tip projects over the cavoatrial junction. Otherwise, no significant interval change. Hypoaeration with interstitial and vascular crowding. Mild linear left lung base opacity, favor atelectasis. Small effusions not excluded. No pneumothorax. Mild aortic tortuosity. Heart size upper normal to mildly enlarged. No acute osseous finding.  IMPRESSION: Right IJ catheter tip projects over the cavoatrial junction. No pneumothorax.  Hypoaeration and mild left lung base opacity, favor atelectasis.   Electronically Signed   By: Jearld LeschAndrew  DelGaizo M.D.   On: 12/21/2013 05:22   Dg Chest Portable 1 View  12/21/2013   CLINICAL DATA:  Syncope.  EXAM: PORTABLE CHEST - 1 VIEW  COMPARISON:  None.  FINDINGS: Study is degraded by multiple overlapping wires and exclusion of the lateral left base.  No definitive cardiomegaly. Negative aortic contours. There is no edema, consolidation, effusion, or pneumothorax.  IMPRESSION: No evidence of acute disease.   Electronically Signed   By: Tiburcio PeaJonathan  Watts M.D.   On: 12/21/2013 00:31    Jeoffrey MassedGHIMIRE,Tel Hevia, MD  [facility] Pager:[phone]  If 7PM-7AM, please contact night-coverage [email or web address] Password [email or web address] 12/25/2013, 1:39 PM   LOS: 5 days

## 2013-12-25 NOTE — Progress Notes (Signed)
Patient had BM at 0815. Formed, brown, with no blood present.

## 2013-12-25 NOTE — Progress Notes (Signed)
Patient had bowel movement at 1220. Dark black, formed, with tar look appearance. Medium amount with darkish blood present.

## 2013-12-26 LAB — CBC
HEMATOCRIT: 24.7 % — AB (ref 39.0–52.0)
HEMOGLOBIN: 8.4 g/dL — AB (ref 13.0–17.0)
MCH: 31.7 pg (ref 26.0–34.0)
MCHC: 34 g/dL (ref 30.0–36.0)
MCV: 93.2 fL (ref 78.0–100.0)
Platelets: 138 10*3/uL — ABNORMAL LOW (ref 150–400)
RBC: 2.65 MIL/uL — ABNORMAL LOW (ref 4.22–5.81)
RDW: 18.3 % — AB (ref 11.5–15.5)
WBC: 6.8 10*3/uL (ref 4.0–10.5)

## 2013-12-26 LAB — BASIC METABOLIC PANEL
Anion gap: 11 (ref 5–15)
BUN: 16 mg/dL (ref 6–23)
CALCIUM: 8.5 mg/dL (ref 8.4–10.5)
CO2: 22 mEq/L (ref 19–32)
CREATININE: 1.43 mg/dL — AB (ref 0.50–1.35)
Chloride: 105 mEq/L (ref 96–112)
GFR, EST AFRICAN AMERICAN: 54 mL/min — AB (ref >90)
GFR, EST NON AFRICAN AMERICAN: 47 mL/min — AB (ref >90)
GLUCOSE: 108 mg/dL — AB (ref 70–99)
Potassium: 3.7 mEq/L (ref 3.7–5.3)
Sodium: 138 mEq/L (ref 137–147)

## 2013-12-26 MED ORDER — SODIUM CHLORIDE 0.9 % IV SOLN
INTRAVENOUS | Status: DC
  Administered 2013-12-27: 500 mL via INTRAVENOUS

## 2013-12-26 MED ORDER — PEG 3350-KCL-NA BICARB-NACL 420 G PO SOLR
4000.0000 mL | Freq: Once | ORAL | Status: AC
  Administered 2013-12-26: 4000 mL via ORAL
  Filled 2013-12-26: qty 4000

## 2013-12-26 MED ORDER — BISACODYL 5 MG PO TBEC
10.0000 mg | DELAYED_RELEASE_TABLET | Freq: Once | ORAL | Status: AC
  Administered 2013-12-26: 10 mg via ORAL
  Filled 2013-12-26: qty 2

## 2013-12-26 NOTE — Progress Notes (Signed)
PROGRESS NOTE  RANE BLITCH UEA:[id number] DOB: Aug 22, 1940 DOA: 12/20/2013 PCP: No primary care provider on file.  HPI/Subjective: 73 year old white male with a history of gout, dyslipidemia, HTN, and who was on ASA-NSADIS and Bactrim(did not recall why). He was admitted by PCCM 3 days for blood in stool and hypovolemic shock. While there he received 1 unit of blood, and was seen by GI. GI completed EGD and tagged RBC scan and continued IV PPI. Pt still reporting dark stools. GI recommending repeat enteroscopy and colonoscopy for tomorrow.       Assessment/Plan: Presumed Upper GI Bleed: Hypotensive when arrived to ED on 10/29. Hgb showed 10.3 but dropped as low as 6.9 on 11/1. Given fluids and 3 units of PRBC thus far. GI completed EGD on 10/30 which showed a severely ulcerated GE junction, and 2 medium sized ulcers in duodenum with no active bleeding seen. Tagged RBC scan on 10/30 showed possible proximal jejunal bleed. Maintained PPI, no sign of active bleeding. Repeat enteroscopy and colonoscopy scheduled for tomorrow as patient continues to have black/bloody stools  Hypovolemic Shock: Secondary to above. Resolved with IVF and PRBC X3.   Acute Blood loss anemia: Secondary to presumed upper GI bleed. Total of 3 units of blood transfused. As of today Hgb down to 8.4 from 9.2 yesterday. Continue to follow Hgb.   Acute on Chronic kidney disease stage III: Likely due to above. 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 No baseline creatinine to compare. Improving. Creatinine 1.43 down on 11/4 from 1.63 on 11/3. Continue to hydrate and monitor.   Hypertension:Chronic condition. Controlled on Norvasc. BP on11/4 is 111/67.   Continue to hold ARB.   Dyslipidemia:Chronic condition. Continue Niacin and Zetia.   Gout: Chronic condition, stable on Allopurinol.   DVT Prophylaxis:  SCDs  Code Status:Full Family Communication:None. Patient is AAOx3 and agreeable to plan of care    Disposition Plan: Remain impatient.   Consultants:  Pulmonary/Intensive care and GI  Procedures:  Upper endoscopy and colonoscopy scheduled for 11/5  Antibiotics: None   Objective: Filed Vitals:   12/25/13 0522 12/25/13 1357 12/25/13 2238 12/26/13 0608  BP: 111/68 117/67 113/61 111/67  Pulse: 72 95 79 65  Temp: 98.3 F (36.8 C) 98.3 F (36.8 C) 98.2 F (36.8 C) 98.3 F (36.8 C)  TempSrc: Oral Oral Oral Oral  Resp: 18 16 18 18   Height:      Weight: 96.1 kg (211 lb 13.8 oz)   96 kg (211 lb 10.3 oz)  SpO2: 95% 94% 96% 95%    Intake/Output Summary (Last 24 hours) at 12/26/13 0958 Last data filed at 12/26/13 0900  Gross per 24 hour  Intake   1080 ml  Output   1975 ml  Net   -895 ml   Filed Weights   12/24/13 0900 12/25/13 0522 12/26/13 0608  Weight: 100.245 kg (221 lb) 96.1 kg (211 lb 13.8 oz) 96 kg (211 lb 10.3 oz)    Exam: General: Patient is watching TV from his chair, NAD, appears stated age. No sign of acute bleed.  HEENT:  EOMI, Anicteic Sclera, MMM.  Neck: Supple, no JVD, no masses  Cardiovascular: RRR, S1 S2 auscultated, no rubs, murmurs or gallops.   Respiratory: Clear to auscultation bilaterally with equal chest rise  Abdomen: Soft, nontender, nondistended, + bowel sounds  Extremities: warm dry without cyanosis clubbing or edema.    Psych: Normal affect and demeanor with intact judgement and insight   Data Reviewed: Basic Metabolic  Panel:  Recent Labs Lab 12/21/13 0115 12/21/13 0900  12/22/13 0400 12/22/13 1500 12/23/13 0600 12/25/13 0416 12/26/13 0445  NA 135*  --   < > 139 137 138 139 138  K 5.3  --   < > 5.2 4.7 4.2 3.7 3.7  CL 102  --   < > 110 107 107 105 105  CO2 14*  --   < > 19 20 22 24 22   GLUCOSE 162*  --   < > 147* 174* 121* 109* 108*  BUN 57*  --   < > 57* 46* 39* 22 16  CREATININE 2.52*  --   < > 2.05* 1.77* 1.76* 1.63* 1.43*  CALCIUM 8.3*  --   < > 8.4 8.4 8.3* 8.4 8.5  MG 1.8 1.9  --   --   --   --   --   --   PHOS 2.8  2.5  --   --   --   --   --   --   < > = values in this interval not displayed. Liver Function Tests:  Recent Labs Lab 12/20/13 2222  AST 24  ALT 21  ALKPHOS 34*  BILITOT 0.2*  PROT 5.4*  ALBUMIN 2.8*  CBC:  Recent Labs Lab 12/20/13 2105  12/21/13 0330 12/21/13 1025  12/22/13 0415 12/23/13 0909 12/24/13 0407 12/25/13 0416 12/25/13 1000 12/26/13 0445  WBC 7.4  < > 9.4 10.0  --  13.3*  --  9.3 8.2 9.1 6.8  NEUTROABS 6.0  --  7.3 7.8*  --   --   --   --   --  5.6  --   HGB 10.3*  < > 8.5* 9.0*  < > 7.8* 6.9* 8.0* 8.4* 9.2* 8.4*  HCT 30.7*  < > 24.5* 25.9*  < > 22.5* 20.4* 23.7* 25.1* 27.3* 24.7*  MCV 95.0  < > 91.8 92.2  --  91.5  --  92.9 92.6 94.5 93.2  PLT 192  < > 171 141*  --  124*  --  123* 136* 160 138*  < > = values in this interval not displayed. Cardiac Enzymes:  Recent Labs Lab 12/20/13 2105 12/20/13 2222 12/21/13 0115 12/21/13 0900  TROPONINI <0.30 <0.30 <0.30 <0.30   CBG:  Recent Labs Lab 12/22/13 0035 12/22/13 0449 12/22/13 0732 12/22/13 1123 12/22/13 1534  GLUCAP 146* 138* 137* 142* 183*    Recent Results (from the past 240 hour(s))  MRSA PCR Screening     Status: None   Collection Time: 12/21/13 12:21 AM  Result Value Ref Range Status   MRSA by PCR NEGATIVE NEGATIVE Final    Comment:        The GeneXpert MRSA Assay (FDA approved for NASAL specimens only), is one component of a comprehensive MRSA colonization surveillance program. It is not intended to diagnose MRSA infection nor to guide or monitor treatment for MRSA infections.     Studies: No results found.  Scheduled Meds: . allopurinol  200 mg Oral BH-q7a  . amLODipine  10 mg Oral Daily  . bisacodyl  10 mg Oral Once  . ezetimibe  10 mg Oral QPM  . niacin  1,000 mg Oral QHS  . pantoprazole (PROTONIX) IV  40 mg Intravenous Q12H  . polyethylene glycol-electrolytes  4,000 mL Oral Once  . sucralfate  1 g Oral TID WC & HS   Continuous Infusions: . sodium chloride       Active Problems:   GI bleed  Acute renal insufficiency   Hypotension due to blood loss   Eddie CandleSarah Nelson PA-S [facility] Pager [phone]. If 7PM-7AM, please contact night-coverage at [email or web address], password [email or web address] 12/26/2013, 9:58 AM  LOS: 6 days    Attending Patient was seen, examined,treatment plan was discussed with the  Advance Practice Provider.  I have directly reviewed the clinical findings, lab, imaging studies and management of this patient in detail. I have made the necessary changes to the above noted documentation, and agree with the documentation, as recorded by the Advance Practice Provider.   Continues to have intermittent back/bloody stools. Hb slightly decreased. GI planning Colonoscopy, repeat Enteroscopy in am. Discussed with family/patient.  Windell NorfolkS Ghimire MD [facility].

## 2013-12-26 NOTE — Plan of Care (Signed)
Problem: Phase I Progression Outcomes Goal: OOB as tolerated unless otherwise ordered Outcome: Completed/Met Date Met:  12/26/13 Goal: Hemodynamically stable Outcome: Completed/Met Date Met:  12/26/13

## 2013-12-26 NOTE — Progress Notes (Signed)
[facility] Progress Note  Subjective: Ill having a little bit of dark blood and dark stool no new complaints  Objective: Vital signs in last 24 hours: Temp:  [98.2 F (36.8 C)-98.3 F (36.8 C)] 98.3 F (36.8 C) (11/04 16100608) Pulse Rate:  [65-95] 65 (11/04 0608) Resp:  [16-18] 18 (11/04 96040608) BP: (111-117)/(61-67) 111/67 mmHg (11/04 0608) SpO2:  [94 %-96 %] 95 % (11/04 54090608) Weight:  [96 kg (211 lb 10.3 oz)] 96 kg (211 lb 10.3 oz) (11/04 81190608) Weight change: -4.245 kg (-9 lb 5.7 oz)   JY:NWGNFAOZHPE:unchanged  Lab Results: Results for orders placed or performed during the hospital encounter of 12/20/13 (from the past 24 hour(s))  CBC with Differential     Status: Abnormal   Collection Time: 12/25/13 10:00 AM  Result Value Ref Range   WBC 9.1 4.0 - 10.5 K/uL   RBC 2.89 (L) 4.22 - 5.81 MIL/uL   Hemoglobin 9.2 (L) 13.0 - 17.0 g/dL   HCT 08.627.3 (L) 57.839.0 - 46.952.0 %   MCV 94.5 78.0 - 100.0 fL   MCH 31.8 26.0 - 34.0 pg   MCHC 33.7 30.0 - 36.0 g/dL   RDW 62.917.9 (H) 52.811.5 - 41.315.5 %   Platelets 160 150 - 400 K/uL   Neutrophils Relative % 61 43 - 77 %   Lymphocytes Relative 28 12 - 46 %   Monocytes Relative 6 3 - 12 %   Eosinophils Relative 4 0 - 5 %   Basophils Relative 1 0 - 1 %   Neutro Abs 5.6 1.7 - 7.7 K/uL   Lymphs Abs 2.5 0.7 - 4.0 K/uL   Monocytes Absolute 0.5 0.1 - 1.0 K/uL   Eosinophils Absolute 0.4 0.0 - 0.7 K/uL   Basophils Absolute 0.1 0.0 - 0.1 K/uL   RBC Morphology POLYCHROMASIA PRESENT    WBC Morphology MILD LEFT SHIFT (1-5% METAS, OCC MYELO, OCC BANDS)   CBC     Status: Abnormal   Collection Time: 12/26/13  4:45 AM  Result Value Ref Range   WBC 6.8 4.0 - 10.5 K/uL   RBC 2.65 (L) 4.22 - 5.81 MIL/uL   Hemoglobin 8.4 (L) 13.0 - 17.0 g/dL   HCT 24.424.7 (L) 01.039.0 - 27.252.0 %   MCV 93.2 78.0 - 100.0 fL   MCH 31.7 26.0 - 34.0 pg   MCHC 34.0 30.0 - 36.0 g/dL   RDW 53.618.3 (H) 64.411.5 - 03.415.5 %   Platelets 138 (L) 150 - 400 K/uL  Basic metabolic panel     Status: Abnormal   Collection Time: 12/26/13  4:45 AM  Result Value Ref Range   Sodium 138 137 - 147 mEq/L   Potassium 3.7 3.7 - 5.3 mEq/L   Chloride 105 96 - 112 mEq/L   CO2 22 19 - 32 mEq/L   Glucose, Bld 108 (H) 70 - 99 mg/dL   BUN 16 6 - 23 mg/dL   Creatinine, Ser 7.421.43 (H) 0.50 - 1.35 mg/dL   Calcium 8.5 8.4 - 59.510.5 mg/dL   GFR calc non Af Amer 47 (L) >90 mL/min   GFR calc Af Amer 54 (L) >90 mL/min   Anion gap 11 5 - 15    Studies/Results: No results found.    Assessment: GI bleeding, jejunal source suggested on previous bleeding study is not seen on EGD but with gastric and duodenal ulcers and severe esophagitis. Unclear whether bleeding stopped  Plan: At this point we'll go ahead and repeat enteroscopy and also up-to-date colonoscopy which  was last done 3 years ago.    Demarius Archila,Duran C 12/26/2013, 8:52 AM

## 2013-12-27 ENCOUNTER — Encounter (HOSPITAL_COMMUNITY): Payer: Self-pay

## 2013-12-27 ENCOUNTER — Encounter (HOSPITAL_COMMUNITY)
Admission: EM | Disposition: A | Discharge status: Home / Self Care | Payer: Self-pay | Source: Home / Self Care | Attending: Internal Medicine

## 2013-12-27 HISTORY — PX: COLONOSCOPY: SHX5424

## 2013-12-27 HISTORY — PX: ENTEROSCOPY: SHX5533

## 2013-12-27 LAB — BASIC METABOLIC PANEL
Anion gap: 13 (ref 5–15)
BUN: 11 mg/dL (ref 6–23)
CHLORIDE: 108 meq/L (ref 96–112)
CO2: 22 meq/L (ref 19–32)
Calcium: 8.5 mg/dL (ref 8.4–10.5)
Creatinine, Ser: 1.37 mg/dL — ABNORMAL HIGH (ref 0.50–1.35)
GFR calc Af Amer: 57 mL/min — ABNORMAL LOW (ref >90)
GFR calc non Af Amer: 49 mL/min — ABNORMAL LOW (ref >90)
GLUCOSE: 117 mg/dL — AB (ref 70–99)
POTASSIUM: 4 meq/L (ref 3.7–5.3)
SODIUM: 143 meq/L (ref 137–147)

## 2013-12-27 LAB — CBC
HCT: 27.1 % — ABNORMAL LOW (ref 39.0–52.0)
HEMOGLOBIN: 9.1 g/dL — AB (ref 13.0–17.0)
MCH: 32.4 pg (ref 26.0–34.0)
MCHC: 33.6 g/dL (ref 30.0–36.0)
MCV: 96.4 fL (ref 78.0–100.0)
Platelets: 148 10*3/uL — ABNORMAL LOW (ref 150–400)
RBC: 2.81 MIL/uL — AB (ref 4.22–5.81)
RDW: 18.6 % — ABNORMAL HIGH (ref 11.5–15.5)
WBC: 6.7 10*3/uL (ref 4.0–10.5)

## 2013-12-27 SURGERY — ENTEROSCOPY
Anesthesia: Moderate Sedation

## 2013-12-27 MED ORDER — MIDAZOLAM HCL 5 MG/ML IJ SOLN
INTRAMUSCULAR | Status: AC
  Filled 2013-12-27: qty 3

## 2013-12-27 MED ORDER — BUTAMBEN-TETRACAINE-BENZOCAINE 2-2-14 % EX AERO
INHALATION_SPRAY | CUTANEOUS | Status: DC | PRN
  Administered 2013-12-27: 2 via TOPICAL

## 2013-12-27 MED ORDER — DIPHENHYDRAMINE HCL 50 MG/ML IJ SOLN
INTRAMUSCULAR | Status: AC
  Filled 2013-12-27: qty 1

## 2013-12-27 MED ORDER — FENTANYL CITRATE 0.05 MG/ML IJ SOLN
INTRAMUSCULAR | Status: AC
  Filled 2013-12-27: qty 4

## 2013-12-27 MED ORDER — FENTANYL CITRATE 0.05 MG/ML IJ SOLN
INTRAMUSCULAR | Status: DC | PRN
  Administered 2013-12-27 (×2): 25 ug via INTRAVENOUS

## 2013-12-27 MED ORDER — MIDAZOLAM HCL 5 MG/5ML IJ SOLN
INTRAMUSCULAR | Status: DC | PRN
  Administered 2013-12-27: 2 mg via INTRAVENOUS
  Administered 2013-12-27 (×3): 1 mg via INTRAVENOUS
  Administered 2013-12-27: 2 mg via INTRAVENOUS

## 2013-12-27 NOTE — Op Note (Signed)
[facility] [address] [place], [id number]   ENTEROSCOPY PROCEDURE REPORT     EXAM DATE: 12/27/2013  PATIENT NAME:      Drew [name], Drew [name]           MR #:      [id number]  BIRTHDATE:       [birth year]      VISIT #:     [id number]  ATTENDING:     Dorena CookeyJohn Chauntay Paszkiewicz, MD     STATUS:     inpatient ASSISTANT:      Mat CarneHopper, Justin and Priscella MannGoldsmith, Autumn REFERRING MD: ASA CLASS:        3  INDICATIONS:  The patient is a 73 yr old male here for an enteroscopy procedure due to ongoing GI bleeding PROCEDURE PERFORMED:     EGD and small bowel enteroscopy   MEDICATIONS:     50 gfentanyl, 3 mg Versed  CONSENT: The patient understands the risks and benefits of the procedure and understands that these risks include, but are not limited to: sedation, allergic reaction, infection, perforation and/or bleeding. Alternative means of evaluation and treatment include, among others: physical exam, x-rays, and/or surgical intervention. The patient elects to proceed with this endoscopic procedure.  DESCRIPTION OF PROCEDURE: During intra-op preparation period all mechanical & medical equipment was checked for proper function. Hand hygiene and appropriate measures for infection prevention was taken. After the risks, benefits and alternatives of the procedure were thoroughly explained, Informed consent was verified, confirmed and timeout was successfully executed by the treatment team. The [id number] ([id number] endoscope was introduced through the mouth and advanced to the jejunum with the scope marked at 150 cm     . The prep was . The instrument was then slowly withdrawn while examining the mucosa circumferentially. The scope was then completely withdrawn from the patient and the procedure terminated. The pulse, BP, and O2 saturation were monitored and documented by the physician and the nursing staff throughout the entire procedure.  The patient was cared for as planned  according to standard protocol, then discharged to recovery in stable condition and with appropriate post procedure care.  Esophagus: persistent circumferential esophagitis with some erosions with exudate at the distal GE junction, no stricture or varices, no signs of malignancy. stomach: Normal Duodenum: Small healing duodenal distal bulb ulcer distal duodenum and explored jejunum were normal no signs of bleeding.    ADVERSE EVENTS:      There were no immediate complications.  IMPRESSIONS:     #1 erosive esophagitis #2 healing duodenal bulb ulcer  RECOMMENDATIONS:     continue PPI therapy and proceed with colonoscopy RECALL:  _____________________________ Dorena CookeyJohn Montae Stager, MD eSigned:  Dorena CookeyJohn Shuntay Everetts, MD 12/27/2013 9:34 AM   cc:

## 2013-12-27 NOTE — Progress Notes (Signed)
EGD/enteroscopy and colonoscopy performed. Slightly improved erosive esophagitis and partly healed duodenal ulcer were seen. Enteroscopy part of procedure was normal  Colonoscopy showed left-sided diverticulosis without any bleeding source seen in the terminal ileum was normal.  Will advance diet and monitor stools and hemoglobin. If continues to bleed, consider capsule endoscopy. Hopefully home tomorrow.

## 2013-12-27 NOTE — H&P (View-Only) (Signed)
[facility] Progress Note  Subjective: Ill having a little bit of dark blood and dark stool no new complaints  Objective: Vital signs in last 24 hours: Temp:  [98.2 F (36.8 [name])-98.3 F (36.8 [name])] 98.3 F (36.8 [name]) (11/04 0608) Pulse Rate:  [65-95] 65 (11/04 0608) Resp:  [16-18] 18 (11/04 0608) BP: (111-117)/(61-67) 111/67 mmHg (11/04 0608) SpO2:  [94 %-96 %] 95 % (11/04 0608) Weight:  [96 kg (211 lb 10.3 oz)] 96 kg (211 lb 10.3 oz) (11/04 0608) Weight change: -4.245 kg (-9 lb 5.7 oz)   PE:unchanged  Lab Results: Results for orders placed or performed during the hospital encounter of 12/20/13 (from the past 24 hour(s))  CBC with Differential     Status: Abnormal   Collection Time: 12/25/13 10:00 AM  Result Value Ref Range   WBC 9.1 4.0 - 10.5 K/uL   RBC 2.89 (L) 4.22 - 5.81 MIL/uL   Hemoglobin 9.2 (L) 13.0 - 17.0 g/dL   HCT 27.3 (L) 39.0 - 52.0 %   MCV 94.5 78.0 - 100.0 fL   MCH 31.8 26.0 - 34.0 pg   MCHC 33.7 30.0 - 36.0 g/dL   RDW 17.9 (H) 11.5 - 15.5 %   Platelets 160 150 - 400 K/uL   Neutrophils Relative % 61 43 - 77 %   Lymphocytes Relative 28 12 - 46 %   Monocytes Relative 6 3 - 12 %   Eosinophils Relative 4 0 - 5 %   Basophils Relative 1 0 - 1 %   Neutro Abs 5.6 1.7 - 7.7 K/uL   Lymphs Abs 2.5 0.7 - 4.0 K/uL   Monocytes Absolute 0.5 0.1 - 1.0 K/uL   Eosinophils Absolute 0.4 0.0 - 0.7 K/uL   Basophils Absolute 0.1 0.0 - 0.1 K/uL   RBC Morphology POLYCHROMASIA PRESENT    WBC Morphology MILD LEFT SHIFT (1-5% METAS, OCC MYELO, OCC BANDS)   CBC     Status: Abnormal   Collection Time: 12/26/13  4:45 AM  Result Value Ref Range   WBC 6.8 4.0 - 10.5 K/uL   RBC 2.65 (L) 4.22 - 5.81 MIL/uL   Hemoglobin 8.4 (L) 13.0 - 17.0 g/dL   HCT 24.7 (L) 39.0 - 52.0 %   MCV 93.2 78.0 - 100.0 fL   MCH 31.7 26.0 - 34.0 pg   MCHC 34.0 30.0 - 36.0 g/dL   RDW 18.3 (H) 11.5 - 15.5 %   Platelets 138 (L) 150 - 400 K/uL  Basic metabolic panel     Status: Abnormal   Collection Time: 12/26/13  4:45 AM  Result Value Ref Range   Sodium 138 137 - 147 mEq/L   Potassium 3.7 3.7 - 5.3 mEq/L   Chloride 105 96 - 112 mEq/L   CO2 22 19 - 32 mEq/L   Glucose, Bld 108 (H) 70 - 99 mg/dL   BUN 16 6 - 23 mg/dL   Creatinine, Ser 1.43 (H) 0.50 - 1.35 mg/dL   Calcium 8.5 8.4 - 10.5 mg/dL   GFR calc non Af Amer 47 (L) >90 mL/min   GFR calc Af Amer 54 (L) >90 mL/min   Anion gap 11 5 - 15    Studies/Results: No results found.    Assessment: GI bleeding, jejunal source suggested on previous bleeding study is not seen on EGD but with gastric and duodenal ulcers and severe esophagitis. Unclear whether bleeding stopped  Plan: At this point we'll go ahead and repeat enteroscopy and also up-to-date colonoscopy which   was last done 3 years ago.    Drew [name],Drew [name] 12/26/2013, 8:52 AM

## 2013-12-27 NOTE — Op Note (Signed)
[facility] [address] [place], [id number]   COLONOSCOPY PROCEDURE REPORT     EXAM DATE: 12/27/2013  PATIENT NAME:      Drew [name], Drew [name]           MR #:      [id number]  BIRTHDATE:       27-Jun-1940      VISIT #:     [id number]  ATTENDING:     Dorena CookeyJohn Yarnell Kozloski, MD     STATUS:     outpatient REFERRING MD: ASA CLASS:  INDICATIONS:  The patient is a 73 yr old male here for a colonoscopy due to GI bleeding and history of colon polyps PROCEDURE PERFORMED:     colonoscopy to the terminal ileum  MEDICATIONS:     Versed 1 mg ESTIMATED BLOOD LOSS:     None  CONSENT: The patient understands the risks and benefits of the procedure and understands that these risks include, but are not limited to: sedation, allergic reaction, infection, perforation and/or bleeding. Alternative means of evaluation and treatment include, among others: physical exam, x-rays, and/or surgical intervention. The patient elects to proceed with this endoscopic procedure.  DESCRIPTION OF PROCEDURE: During intra-op preparation period all mechanical & medical equipment was checked for proper function. Hand hygiene and appropriate measures for infection prevention was taken. After the risks, benefits and alternatives of the procedure were thoroughly explained, Informed consent was verified, confirmed and timeout was successfully executed by the treatment team. A digital exam The    endoscope was introduced through the anus and advanced to the cecum with intubation of the ileum     . No adverse events experienced. The prep was excellent     . The instrument was then slowly withdrawn as the colon was fully examined. the terminal ileum as well as the cecum and ascending colon appear normal with no mass polyps diverticula or other mucosal abnormalities      the rectum appeared normal and retroflexed view of the anus revealed small internal hemorrhoids.       The scope was then completely  withdrawn from the patient and the procedure terminated. WITHDRAWAL TIME:    ADVERSE EVENTS:      There were no immediate complications.  IMPRESSIONS:     diverticulosis otherwise normal colonoscopy   RECOMMENDATIONS:     repeat colonoscopy in 5 years. RECALL:  Dorena CookeyJohn Nevaeh Korte, MD eSigned:  Dorena CookeyJohn Inola Lisle, MD 12/27/2013 9:55 AM   cc:  CPT CODES: ICD CODES:  The ICD and CPT codes recommended by this software are interpretations from the data that the clinical staff has captured with the software.  The verification of the translation of this report to the ICD and CPT codes and modifiers is the sole responsibility of the health care institution and practicing physician where this report was generated.  [employer] will not be held responsible for the validity of the ICD and CPT codes included on this report.  AMA assumes no liability for data contained or not contained herein. CPT is a Publishing rights managerregistered trademark of the [employer].

## 2013-12-27 NOTE — Plan of Care (Signed)
Problem: Phase II Progression Outcomes Goal: Tolerating diet Outcome: Completed/Met Date Met:  12/27/13

## 2013-12-27 NOTE — Interval H&P Note (Signed)
History and Physical Interval Note:  12/27/2013 9:01 AM  Drew DownsJohn [name] [name]  has presented today for surgery, with the diagnosis of GI bleed  The various methods of treatment have been discussed with the patient and family. After consideration of risks, benefits and other options for treatment, the patient has consented to  Procedure(s): ENTEROSCOPY (N/A) COLONOSCOPY (N/A) as a surgical intervention .  The patient's history has been reviewed, patient examined, no change in status, stable for surgery.  I have reviewed the patient's chart and labs.  Questions were answered to the patient's satisfaction.     Antoney Biven,Drew [name]

## 2013-12-27 NOTE — Progress Notes (Signed)
PATIENT DETAILS Name: Drew [name] Age: 73 y.o. Sex: male Date of Birth: 15-Apr-1940 Admit Date: 12/20/2013 Admitting Physician Leslye Peerobert S Byrum, MD PCP:No primary care provider on file.  Subjective: No major complaints-brown stools this am  Assessment/Plan: Active Problems:  Presumed Upper GI Bleed: Hypotensive when arrived to ED on 10/29. Hgb showed 10.3 but dropped as low as 6.9 on 11/1. Given fluids and 3 units of PRBC thus far. GI completed EGD on 10/30 which showed a severely ulcerated GE junction, and 2 medium sized ulcers in duodenum with no active bleeding seen. Tagged RBC scan on 10/30 showed possible proximal jejunal bleed. Maintained on PPI,since continued to have intermittent black stools, EGD with enteroscopy and Colonoscopy done on 11/5, no active bleeding noted. Plans are to monitor, if Hb continues to stay stable and no sign of active bleeding-home on 11/6.   Hypovolemic Shock: Secondary to above. Resolved with IVF and PRBC X3.   Acute Blood loss anemia: Secondary to presumed upper GI bleed. Total of 3 units of blood transfused. Hb stable. Continue to follow Hgb.   Acute on Chronic kidney disease stage III: Likely due to above. 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 No baseline creatinine to compare. Improving.Continue to hydrate and monitor.   Hypertension:Chronic condition. Controlled on Norvasc. Continue to hold ARB.   Dyslipidemia:Chronic condition. Continue Niacin and Zetia.   Gout: Chronic condition, stable on Allopurinol.   Disposition: Remain inpatient  Antibiotics:  None  DVT Prophylaxis:  SCD's  Code Status: Full code   Family Communication None  Procedures:  EGD-10/30  EGD/Colonoscopy-11/5  CONSULTS:  GI  MEDICATIONS: Scheduled Meds: . allopurinol  200 mg Oral BH-q7a  . amLODipine  10 mg Oral Daily  . ezetimibe  10 mg Oral QPM  . niacin  1,000 mg Oral QHS  . pantoprazole (PROTONIX) IV  40 mg Intravenous Q12H  .  sucralfate  1 g Oral TID WC & HS   Continuous Infusions: . sodium chloride 500 mL (12/27/13 0828)   PRN Meds:.diphenhydrAMINE, ondansetron (ZOFRAN) IV, sodium chloride  Antibiotics: Anti-infectives    Start     Dose/Rate Route Frequency Ordered Stop   12/22/13 1644  sulfamethoxazole-trimethoprim (BACTRIM DS) 800-160 MG per tablet 1 tablet  Status:  Discontinued     1 tablet Oral 2 times daily 12/22/13 1644 12/23/13 1221       PHYSICAL EXAM: Vital signs in last 24 hours: Filed Vitals:   12/27/13 1055 12/27/13 1056 12/27/13 1143 12/27/13 1253  BP: 131/95  117/69 126/73  Pulse: 73 74 72 77  Temp:   98.6 F (37 C) 98.6 F (37 C)  TempSrc:   Oral Oral  Resp: 19 16 17 17   Height:      Weight:      SpO2: 96% 95% 97% 95%    Weight change: -1.108 kg (-2 lb 7.1 oz) Filed Weights   12/25/13 0522 12/26/13 0608 12/27/13 0621  Weight: 96.1 kg (211 lb 13.8 oz) 96 kg (211 lb 10.3 oz) 94.892 kg (209 lb 3.2 oz)   Body mass index is 30.88 kg/(m^2).   Gen Exam: Awake and alert with clear speech.  Neck: Supple, No JVD.   Chest: B/L Clear.   CVS: S1 S2 Regular, no murmurs.  Abdomen: soft, BS +, non tender, non distended.  Extremities: no edema, lower extremities warm to touch. Neurologic: Non Focal.   Skin: No Rash.   Wounds: N/A.    Intake/Output from previous  day:  Intake/Output Summary (Last 24 hours) at 12/27/13 1349 Last data filed at 12/27/13 0800  Gross per 24 hour  Intake   1440 ml  Output      0 ml  Net   1440 ml    LAB RESULTS: CBC  Recent Labs Lab 12/20/13 2105  12/21/13 0330 12/21/13 1025  12/24/13 0407 12/25/13 0416 12/25/13 1000 12/26/13 0445 12/27/13 0410  WBC 7.4  < > 9.4 10.0  < > 9.3 8.2 9.1 6.8 6.7  HGB 10.3*  < > 8.5* 9.0*  < > 8.0* 8.4* 9.2* 8.4* 9.1*  HCT 30.7*  < > 24.5* 25.9*  < > 23.7* 25.1* 27.3* 24.7* 27.1*  PLT 192  < > 171 141*  < > 123* 136* 160 138* 148*  MCV 95.0  < > 91.8 92.2  < > 92.9 92.6 94.5 93.2 96.4  MCH 31.9  < > 31.8  32.0  < > 31.4 31.0 31.8 31.7 32.4  MCHC 33.6  < > 34.7 34.7  < > 33.8 33.5 33.7 34.0 33.6  RDW 14.0  < > 14.1 15.3  < > 17.0* 18.0* 17.9* 18.3* 18.6*  LYMPHSABS 1.0  --  1.5 1.5  --   --   --  2.5  --   --   MONOABS 0.4  --  0.5 0.7  --   --   --  0.5  --   --   EOSABS 0.0  --  0.0 0.0  --   --   --  0.4  --   --   BASOSABS 0.0  --  0.0 0.0  --   --   --  0.1  --   --   < > = values in this interval not displayed.  Chemistries   Recent Labs Lab 12/21/13 0115 12/21/13 0900  12/22/13 1500 12/23/13 0600 12/25/13 0416 12/26/13 0445 12/27/13 0410  NA 135*  --   < > 137 138 139 138 143  K 5.3  --   < > 4.7 4.2 3.7 3.7 4.0  CL 102  --   < > 107 107 105 105 108  CO2 14*  --   < > 20 22 24 22 22   GLUCOSE 162*  --   < > 174* 121* 109* 108* 117*  BUN 57*  --   < > 46* 39* 22 16 11   CREATININE 2.52*  --   < > 1.77* 1.76* 1.63* 1.43* 1.37*  CALCIUM 8.3*  --   < > 8.4 8.3* 8.4 8.5 8.5  MG 1.8 1.9  --   --   --   --   --   --   < > = values in this interval not displayed.  CBG:  Recent Labs Lab 12/22/13 0035 12/22/13 0449 12/22/13 0732 12/22/13 1123 12/22/13 1534  GLUCAP 146* 138* 137* 142* 183*    GFR Estimated Creatinine Clearance: 53.8 mL/min (by C-G formula based on Cr of 1.37).  Coagulation profile  Recent Labs Lab 12/21/13 0115 12/22/13 0400  INR 1.47 1.32    Cardiac Enzymes  Recent Labs Lab 12/20/13 2222 12/21/13 0115 12/21/13 0900  TROPONINI <0.30 <0.30 <0.30    Invalid input(s): POCBNP No results for input(s): DDIMER in the last 72 hours. No results for input(s): HGBA1C in the last 72 hours. No results for input(s): CHOL, HDL, LDLCALC, TRIG, CHOLHDL, LDLDIRECT in the last 72 hours. No results for input(s): TSH, T4TOTAL, T3FREE, THYROIDAB in the last 72 hours.  Invalid input(s): FREET3 No results for input(s): VITAMINB12, FOLATE, FERRITIN, TIBC, IRON, RETICCTPCT in the last 72 hours. No results for input(s): LIPASE, AMYLASE in the last 72  hours.  Urine Studies No results for input(s): UHGB, CRYS in the last 72 hours.  Invalid input(s): UACOL, UAPR, USPG, UPH, UTP, UGL, UKET, UBIL, UNIT, UROB, ULEU, UEPI, UWBC, URBC, UBAC, CAST, UCOM, BILUA  MICROBIOLOGY: Recent Results (from the past 240 hour(s))  MRSA PCR Screening     Status: None   Collection Time: 12/21/13 12:21 AM  Result Value Ref Range Status   MRSA by PCR NEGATIVE NEGATIVE Final    Comment:        The GeneXpert MRSA Assay (FDA approved for NASAL specimens only), is one component of a comprehensive MRSA colonization surveillance program. It is not intended to diagnose MRSA infection nor to guide or monitor treatment for MRSA infections.    RADIOLOGY STUDIES/RESULTS: Nm Gi Blood Loss  12/21/2013   ADDENDUM REPORT: 12/21/2013 10:17  ADDENDUM: Critical Value/emergent results were called by telephone at the time of interpretation on 12/21/2013 at 9:45 Am to Dr. Tyson AliasFEINSTEIN, who verbally acknowledged these results.   Electronically Signed   By: Charlett NoseKevin  Dover M.D.   On: 12/21/2013 10:17   12/21/2013   CLINICAL DATA:  Acute GI bleed.  Abdominal pain.  EXAM: NUCLEAR MEDICINE GASTROINTESTINAL BLEEDING SCAN  TECHNIQUE: Sequential abdominal images were obtained following intravenous administration of Tc-13101m labeled red blood cells.  RADIOPHARMACEUTICALS:  25.0 mCi Tc-26101m in-vitro labeled red cells.  COMPARISON:  None.  FINDINGS: Abnormal activity is seen within a left abdominal small bowel loops within the first hr. Continuation of the study into second are demonstrates this is definitively within small bowel. Findings most compatible with a proximal/jejunal GI bleed.  IMPRESSION: Radiotracer accumulation within left abdominal small bowel loops most compatible with proximal/jejunal GI bleed.  Electronically Signed: By: Charlett NoseKevin  Dover M.D. On: 12/21/2013 09:29   Dg Chest Port 1 View  12/21/2013   CLINICAL DATA:  Central line placement  EXAM: PORTABLE CHEST - 1 VIEW   COMPARISON:  12/20/2013  FINDINGS: Right IJ catheter tip projects over the cavoatrial junction. Otherwise, no significant interval change. Hypoaeration with interstitial and vascular crowding. Mild linear left lung base opacity, favor atelectasis. Small effusions not excluded. No pneumothorax. Mild aortic tortuosity. Heart size upper normal to mildly enlarged. No acute osseous finding.  IMPRESSION: Right IJ catheter tip projects over the cavoatrial junction. No pneumothorax.  Hypoaeration and mild left lung base opacity, favor atelectasis.   Electronically Signed   By: Jearld LeschAndrew  DelGaizo M.D.   On: 12/21/2013 05:22   Dg Chest Portable 1 View  12/21/2013   CLINICAL DATA:  Syncope.  EXAM: PORTABLE CHEST - 1 VIEW  COMPARISON:  None.  FINDINGS: Study is degraded by multiple overlapping wires and exclusion of the lateral left base.  No definitive cardiomegaly. Negative aortic contours. There is no edema, consolidation, effusion, or pneumothorax.  IMPRESSION: No evidence of acute disease.   Electronically Signed   By: Tiburcio PeaJonathan  Watts M.D.   On: 12/21/2013 00:31    Jeoffrey MassedGHIMIRE,Jaeline Whobrey, MD  [facility] Pager:[phone]  If 7PM-7AM, please contact night-coverage [email or web address] Password [email or web address] 12/27/2013, 1:49 PM   LOS: 7 days

## 2013-12-28 ENCOUNTER — Encounter (HOSPITAL_COMMUNITY): Payer: Self-pay | Admitting: Gastroenterology

## 2013-12-28 DIAGNOSIS — M109 Gout, unspecified: Secondary | ICD-10-CM

## 2013-12-28 LAB — CBC WITH DIFFERENTIAL/PLATELET
Basophils Absolute: 0 10*3/uL (ref 0.0–0.1)
Basophils Relative: 0 % (ref 0–1)
EOS PCT: 2 % (ref 0–5)
Eosinophils Absolute: 0.1 10*3/uL (ref 0.0–0.7)
HEMATOCRIT: 26.1 % — AB (ref 39.0–52.0)
HEMOGLOBIN: 8.7 g/dL — AB (ref 13.0–17.0)
LYMPHS ABS: 1 10*3/uL (ref 0.7–4.0)
Lymphocytes Relative: 14 % (ref 12–46)
MCH: 31.3 pg (ref 26.0–34.0)
MCHC: 33.3 g/dL (ref 30.0–36.0)
MCV: 93.9 fL (ref 78.0–100.0)
MONO ABS: 0.9 10*3/uL (ref 0.1–1.0)
Monocytes Relative: 12 % (ref 3–12)
Neutro Abs: 5.2 10*3/uL (ref 1.7–7.7)
Neutrophils Relative %: 72 % (ref 43–77)
Platelets: 143 10*3/uL — ABNORMAL LOW (ref 150–400)
RBC: 2.78 MIL/uL — ABNORMAL LOW (ref 4.22–5.81)
RDW: 17.9 % — ABNORMAL HIGH (ref 11.5–15.5)
WBC: 7.1 10*3/uL (ref 4.0–10.5)

## 2013-12-28 LAB — HEMOGLOBIN AND HEMATOCRIT, BLOOD
HCT: 26.5 % — ABNORMAL LOW (ref 39.0–52.0)
Hemoglobin: 8.8 g/dL — ABNORMAL LOW (ref 13.0–17.0)

## 2013-12-28 MED ORDER — PREDNISONE 20 MG PO TABS
40.0000 mg | ORAL_TABLET | Freq: Every day | ORAL | Status: DC
  Administered 2013-12-28: 40 mg via ORAL
  Filled 2013-12-28: qty 2

## 2013-12-28 MED ORDER — AMLODIPINE BESYLATE 10 MG PO TABS: 10.0000 mg | ORAL_TABLET | Freq: Every day | ORAL | Status: DC

## 2013-12-28 MED ORDER — PANTOPRAZOLE SODIUM 40 MG PO TBEC: 40.0000 mg | DELAYED_RELEASE_TABLET | Freq: Two times a day (BID) | ORAL | Status: DC

## 2013-12-28 MED ORDER — PREDNISONE 10 MG PO TABS: ORAL_TABLET | ORAL | Status: DC

## 2013-12-28 NOTE — Discharge Instructions (Signed)
No nonsteroidal anti-inflammatory medications, no aspirin, no Motrin or Advil. Please talk with your primary care provider before initiating any of these medications.  Please ask your primary care provider early next week to check CBC. Please have your primary care provider assess your right knee as well.

## 2013-12-28 NOTE — Discharge Summary (Signed)
PATIENT DETAILS Name: Drew [name] Age: 73 y.o. Sex: male Date of Birth: 06/13/1940 MRN: [id number]. Admitting Physician: Leslye Peer, MD PCP:No primary care provider on file.  Admit Date: 12/20/2013 Discharge date: 12/28/2013  Recommendations for Outpatient Follow-up:  1. Repeat CBC in 1 week. 2. Avoid NSAIDs 3. Continue prednisone taper for one week for presumed acute gouty arthritis of right knee.  PRIMARY DISCHARGE DIAGNOSIS:  Active Problems:   GI bleed   Acute renal insufficiency   Hypotension due to blood loss      PAST MEDICAL HISTORY: Past Medical History  Diagnosis Date  . Hypertension   . Hypercholesteremia     DISCHARGE MEDICATIONS: Current Discharge Medication List    START taking these medications   Details  amLODipine (NORVASC) 10 MG tablet Take 1 tablet (10 mg total) by mouth daily. Qty: 30 tablet, Refills: 0    pantoprazole (PROTONIX) 40 MG tablet Take 1 tablet (40 mg total) by mouth 2 (two) times daily. Take twice daily for 1 month and then daily indefinitely Qty: 180 tablet, Refills: 0    predniSONE (DELTASONE) 10 MG tablet Take 4 tablets (40 mg) daily for 2 days, then, Take 3 tablets (30 mg) daily for 2 days, then, Take 2 tablets (20 mg) daily for 2 days, then, Take 1 tablets (10 mg) daily for 1 days, then stop Qty: 19 tablet, Refills: 0      CONTINUE these medications which have NOT CHANGED   Details  allopurinol (ZYLOPRIM) 100 MG tablet Take 200 mg by mouth every morning.    ezetimibe (ZETIA) 10 MG tablet Take 10 mg by mouth every evening.    fenofibrate (TRICOR) 145 MG tablet Take 145 mg by mouth every evening.    Multiple Vitamin (ONE-A-DAY MENS PO) Take 1 tablet by mouth every morning.    niacin (NIASPAN) 1000 MG CR tablet Take 1,000 mg by mouth at bedtime.      STOP taking these medications     aspirin EC 325 MG tablet      indomethacin (INDOCIN) 50 MG capsule      losartan (COZAAR) 50 MG tablet      sulfamethoxazole-trimethoprim (BACTRIM DS) 800-160 MG per tablet         ALLERGIES:  No Known Allergies  BRIEF HPI:  See H&P, Labs, Consult and Test reports for all details in brief, patient is 73 y.o. M brought to [facility] ED on 10/29 after he had 3 episodes of hematochezia starting around 4pm same day. In ED, pt had frank blood per rectum per EDP and pt was hypotensive with SBP in 70's - 80's.Admitted  To the intensive care unit.  CONSULTATIONS:   pulmonary/intensive care and GI  PERTINENT RADIOLOGIC STUDIES: Nm Gi Blood Loss  12/21/2013   ADDENDUM REPORT: 12/21/2013 10:17  ADDENDUM: Critical Value/emergent results were called by telephone at the time of interpretation on 12/21/2013 at 9:45 Am to Dr. Tyson Alias, who verbally acknowledged these results.   Electronically Signed   By: Charlett Nose M.D.   On: 12/21/2013 10:17   12/21/2013   CLINICAL DATA:  Acute GI bleed.  Abdominal pain.  EXAM: NUCLEAR MEDICINE GASTROINTESTINAL BLEEDING SCAN  TECHNIQUE: Sequential abdominal images were obtained following intravenous administration of Tc-61m labeled red blood cells.  RADIOPHARMACEUTICALS:  25.0 mCi Tc-63m in-vitro labeled red cells.  COMPARISON:  None.  FINDINGS: Abnormal activity is seen within a left abdominal small bowel loops within the first hr. Continuation of the study into second are demonstrates  this is definitively within small bowel. Findings most compatible with a proximal/jejunal GI bleed.  IMPRESSION: Radiotracer accumulation within left abdominal small bowel loops most compatible with proximal/jejunal GI bleed.  Electronically Signed: By: Charlett NoseKevin  Dover M.D. On: 12/21/2013 09:29   Dg Chest Port 1 View  12/21/2013   CLINICAL DATA:  Central line placement  EXAM: PORTABLE CHEST - 1 VIEW  COMPARISON:  12/20/2013  FINDINGS: Right IJ catheter tip projects over the cavoatrial junction. Otherwise, no significant interval change. Hypoaeration with interstitial and vascular crowding. Mild linear  left lung base opacity, favor atelectasis. Small effusions not excluded. No pneumothorax. Mild aortic tortuosity. Heart size upper normal to mildly enlarged. No acute osseous finding.  IMPRESSION: Right IJ catheter tip projects over the cavoatrial junction. No pneumothorax.  Hypoaeration and mild left lung base opacity, favor atelectasis.   Electronically Signed   By: Jearld LeschAndrew  DelGaizo M.D.   On: 12/21/2013 05:22   Dg Chest Portable 1 View  12/21/2013   CLINICAL DATA:  Syncope.  EXAM: PORTABLE CHEST - 1 VIEW  COMPARISON:  None.  FINDINGS: Study is degraded by multiple overlapping wires and exclusion of the lateral left base.  No definitive cardiomegaly. Negative aortic contours. There is no edema, consolidation, effusion, or pneumothorax.  IMPRESSION: No evidence of acute disease.   Electronically Signed   By: Tiburcio PeaJonathan  Watts M.D.   On: 12/21/2013 00:31     PERTINENT LAB RESULTS: CBC:  Recent Labs  12/27/13 0410 12/28/13 12/28/13 0454  WBC 6.7 7.1  --   HGB 9.1* 8.7* 8.8*  HCT 27.1* 26.1* 26.5*  PLT 148* 143*  --    CMET CMP     Component Value Date/Time   NA 143 12/27/2013 0410   K 4.0 12/27/2013 0410   CL 108 12/27/2013 0410   CO2 22 12/27/2013 0410   GLUCOSE 117* 12/27/2013 0410   BUN 11 12/27/2013 0410   CREATININE 1.37* 12/27/2013 0410   CALCIUM 8.5 12/27/2013 0410   PROT 5.4* 12/20/2013 2222   ALBUMIN 2.8* 12/20/2013 2222   AST 24 12/20/2013 2222   ALT 21 12/20/2013 2222   ALKPHOS 34* 12/20/2013 2222   BILITOT 0.2* 12/20/2013 2222   GFRNONAA 49* 12/27/2013 0410   GFRAA 57* 12/27/2013 0410    GFR Estimated Creatinine Clearance: 54.1 mL/min (by C-G formula based on Cr of 1.37). No results for input(s): LIPASE, AMYLASE in the last 72 hours. No results for input(s): CKTOTAL, CKMB, CKMBINDEX, TROPONINI in the last 72 hours. Invalid input(s): POCBNP No results for input(s): DDIMER in the last 72 hours. No results for input(s): HGBA1C in the last 72 hours. No results  for input(s): CHOL, HDL, LDLCALC, TRIG, CHOLHDL, LDLDIRECT in the last 72 hours. No results for input(s): TSH, T4TOTAL, T3FREE, THYROIDAB in the last 72 hours.  Invalid input(s): FREET3 No results for input(s): VITAMINB12, FOLATE, FERRITIN, TIBC, IRON, RETICCTPCT in the last 72 hours. Coags: No results for input(s): INR in the last 72 hours.  Invalid input(s): PT Microbiology: Recent Results (from the past 240 hour(s))  MRSA PCR Screening     Status: None   Collection Time: 12/21/13 12:21 AM  Result Value Ref Range Status   MRSA by PCR NEGATIVE NEGATIVE Final    Comment:        The GeneXpert MRSA Assay (FDA approved for NASAL specimens only), is one component of a comprehensive MRSA colonization surveillance program. It is not intended to diagnose MRSA infection nor to guide or monitor treatment for MRSA  infections.     BRIEF HOSPITAL COURSE:  Presumed Upper GI Bleed: Hypotensive when arrived to ED on 10/29. Hgb showed 10.3 but dropped as low as 6.9 on 11/1. Given fluids and 3 units of PRBC so far. GI completed EGD on 10/30 which showed a severely ulcerated GE junction, and 2 medium sized ulcers in duodenum with no active bleeding seen. Tagged RBC scan on 10/30 showed possible proximal jejunal bleed. Maintained on PPI,since continued to have intermittent black stools, EGD with enteroscopy and Colonoscopy done on 11/5, no active bleeding noted.doing well on 11/6, with no further melena or hematochezia, hemoglobin remains stable. Spoke with Dr. Hayes-gastroenterology-okay for discharge. Patient to repeat CBC in 1 week at PCPs office, and to follow-up with Dr. Madilyn FiremanHayes in 2-3 weeks.   Hypovolemic Shock: Secondary to above. Resolved with IVF and PRBC X3.   Acute Blood loss anemia: Secondary to presumed upper GI bleed. Total of 3 units of blood transfused. Hb stable at 8.9. Repeat CBC in 1 week at PCPs office.  Acute on Chronic kidney disease stage III: Likely due to above. 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 No baseline creatinine to compare. Improving, creatinine down to 1.37 at the time of discharge.Continue to monitoras outpatient.   Hypertension:Chronic condition. Controlled on Norvasc. Continue to hold ARB.on discharge   Dyslipidemia:Chronic condition. Continue Niacin and Zetia.   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 No overlying erythema or fever. Per patient, feels exactly like his usual gout flare. Spoke with Dr. Victorino DikeHewitt over the phone, recommended against arthrocentesis at this time, suggested to treat empirically with prednisone and have patient follow-up with PCP. This plan of care was discussed with patient and spouse at bedside, they were agreeable.   TODAY-DAY OF DISCHARGE:  Subjective:   Drew [name] today has no headache,no chest abdominal pain,no new weakness tingling or numbness, feels much better wants to go home today.   Objective:   Blood pressure 139/56, pulse 81, temperature 98.4 F (36.9 C), temperature source Oral, resp. rate 18, height 5\' 9"  (1.753 m), weight 96.163 kg (212 lb), SpO2 94 %.  Intake/Output Summary (Last 24 hours) at 12/28/13 1152 Last data filed at 12/28/13 0900  Gross per 24 hour  Intake    600 ml  Output    800 ml  Net   -200 ml   Filed Weights   12/26/13 0608 12/27/13 0621 12/28/13 0500  Weight: 96 kg (211 lb 10.3 oz) 94.892 kg (209 lb 3.2 oz) 96.163 kg (212 lb)    Exam Awake Alert, Oriented *3, No new F.N deficits, Normal affect .AT,PERRAL Supple Neck,No JVD, No cervical lymphadenopathy appriciated.  Symmetrical Chest wall movement, Good air movement bilaterally, CTAB RRR,No Gallops,Rubs or new Murmurs, No Parasternal Heave +ve B.Sounds, Abd Soft, Non tender, No organomegaly appriciated, No rebound -guarding or rigidity. No Cyanosis, Clubbing or edema, No new Rash or bruise Right knee mild effusion, no overlying erythema. Very minimally  tender.  DISCHARGE CONDITION: Stable  DISPOSITION: Home  DISCHARGE INSTRUCTIONS:    Activity:  As tolerated   No nonsteroidal anti-inflammatory medications, no aspirin, no Motrin or Advil. Please talk with your primary care provider before initiating any of these medications.   Please ask your primary care provider early next week to check CBC. Please have your primary care provider assess your right knee as well.   Diet recommendation: Heart Healthy diet  Discharge Instructions    Call MD  for:  persistant nausea and vomiting    Complete by:  As directed      Call MD for:    Complete by:  As directed   Bloody stools     Diet - low sodium heart healthy    Complete by:  As directed      Increase activity slowly    Complete by:  As directed            Follow-up Information    Follow up with HAYES,Griffey C, MD. Schedule an appointment as soon as possible for a visit in 3 weeks.   Specialty:  Gastroenterology   Contact information:   [address] [place] [address] [phone]       Follow up with [facility], MD. Schedule an appointment as soon as possible for a visit on 12/31/2013.   Specialty:  Family Medicine   Contact information:   Dr. Burnell Blanks [address] [place] [address] [phone]         Total Time spent on discharge equals 45 minutes.  SignedJeoffrey Massed 12/28/2013 11:52 AM

## 2013-12-28 NOTE — Progress Notes (Signed)
D/c to home via wheelchair. Pain denied.  VSS. Breathing regular and unlabored on room air.  D/c instructions and prescriptions given and family and patient demonstrated understanding

## 2014-06-11 ENCOUNTER — Other Ambulatory Visit: Payer: Self-pay | Admitting: Internal Medicine

## 2014-06-28 ENCOUNTER — Other Ambulatory Visit: Payer: Self-pay | Admitting: Urology

## 2014-06-28 DIAGNOSIS — N5089 Other specified disorders of the male genital organs: Secondary | ICD-10-CM

## 2014-07-03 ENCOUNTER — Ambulatory Visit
Admission: RE | Admit: 2014-07-03 | Discharge: 2014-07-03 | Disposition: A | Discharge status: Home / Self Care | Payer: Medicare Other | Source: Ambulatory Visit | Attending: Urology | Admitting: Urology

## 2014-07-03 ENCOUNTER — Ambulatory Visit: Payer: Medicare Other

## 2014-07-03 DIAGNOSIS — N508 Other specified disorders of male genital organs: Secondary | ICD-10-CM | POA: Insufficient documentation

## 2014-07-03 DIAGNOSIS — I10 Essential (primary) hypertension: Secondary | ICD-10-CM | POA: Diagnosis not present

## 2014-07-03 DIAGNOSIS — E785 Hyperlipidemia, unspecified: Secondary | ICD-10-CM | POA: Diagnosis not present

## 2014-07-03 DIAGNOSIS — R946 Abnormal results of thyroid function studies: Secondary | ICD-10-CM | POA: Diagnosis not present

## 2014-07-03 DIAGNOSIS — N433 Hydrocele, unspecified: Secondary | ICD-10-CM | POA: Diagnosis not present

## 2014-07-03 DIAGNOSIS — M542 Cervicalgia: Secondary | ICD-10-CM | POA: Diagnosis not present

## 2014-07-03 DIAGNOSIS — N5089 Other specified disorders of the male genital organs: Secondary | ICD-10-CM

## 2014-07-04 DIAGNOSIS — I10 Essential (primary) hypertension: Secondary | ICD-10-CM | POA: Diagnosis not present

## 2014-07-04 DIAGNOSIS — E669 Obesity, unspecified: Secondary | ICD-10-CM | POA: Diagnosis not present

## 2014-07-04 DIAGNOSIS — N508 Other specified disorders of male genital organs: Secondary | ICD-10-CM | POA: Diagnosis not present

## 2014-08-14 ENCOUNTER — Other Ambulatory Visit: Payer: Self-pay

## 2014-08-14 DIAGNOSIS — M1000 Idiopathic gout, unspecified site: Secondary | ICD-10-CM

## 2014-08-14 MED ORDER — ALLOPURINOL 100 MG PO TABS: 200.0000 mg | ORAL_TABLET | ORAL | Status: DC

## 2014-08-14 NOTE — Telephone Encounter (Signed)
Received a fax from [facility] on [address], requesting a refill of Allopurinol 100mg .

## 2014-08-15 ENCOUNTER — Other Ambulatory Visit: Payer: Self-pay | Admitting: Family Medicine

## 2014-08-15 NOTE — Telephone Encounter (Signed)
Patient needs refill on Allopurinol to be sent to [facility] in [place]. Pharmacy # [phone].

## 2014-08-16 ENCOUNTER — Other Ambulatory Visit: Payer: Self-pay | Admitting: Family Medicine

## 2014-08-16 DIAGNOSIS — M1A0710 Idiopathic chronic gout, right ankle and foot, without tophus (tophi): Secondary | ICD-10-CM

## 2014-08-16 DIAGNOSIS — M1000 Idiopathic gout, unspecified site: Secondary | ICD-10-CM

## 2014-08-16 DIAGNOSIS — M1A0790 Idiopathic chronic gout, unspecified ankle and foot, without tophus (tophi): Secondary | ICD-10-CM | POA: Insufficient documentation

## 2014-08-16 MED ORDER — ALLOPURINOL 100 MG PO TABS: 300.0000 mg | ORAL_TABLET | Freq: Every day | ORAL | Status: DC

## 2014-08-16 NOTE — Telephone Encounter (Signed)
Consulted with the pharmacist at [facility] and she stated that the quantity should be more per patient and his wife. Dr. Sherley Bounds resubmitted it electronically with increase quanitity.

## 2014-08-16 NOTE — Telephone Encounter (Signed)
Tried calling patient and no answer. 

## 2014-08-16 NOTE — Telephone Encounter (Signed)
Pt's wife called stating that [facility] hasn't received the rx for his Allopurinol.  Please contact pharmacy to verify and call patient of the status.

## 2014-10-30 ENCOUNTER — Other Ambulatory Visit: Payer: Self-pay | Admitting: Family Medicine

## 2014-10-30 DIAGNOSIS — I10 Essential (primary) hypertension: Secondary | ICD-10-CM

## 2014-11-04 ENCOUNTER — Ambulatory Visit: Payer: Self-pay | Admitting: Family Medicine

## 2014-11-06 ENCOUNTER — Ambulatory Visit (INDEPENDENT_AMBULATORY_CARE_PROVIDER_SITE_OTHER): Payer: Medicare Other | Admitting: Family Medicine

## 2014-11-06 ENCOUNTER — Encounter: Payer: Self-pay | Admitting: Family Medicine

## 2014-11-06 VITALS — BP 118/80 | HR 80 | Temp 98.1°F | Resp 18 | Ht 68.0 in | Wt 224.3 lb

## 2014-11-06 DIAGNOSIS — Z862 Personal history of diseases of the blood and blood-forming organs and certain disorders involving the immune mechanism: Secondary | ICD-10-CM | POA: Insufficient documentation

## 2014-11-06 DIAGNOSIS — I10 Essential (primary) hypertension: Secondary | ICD-10-CM | POA: Diagnosis not present

## 2014-11-06 DIAGNOSIS — Z23 Encounter for immunization: Secondary | ICD-10-CM | POA: Diagnosis not present

## 2014-11-06 DIAGNOSIS — N183 Chronic kidney disease, stage 3 unspecified: Secondary | ICD-10-CM | POA: Insufficient documentation

## 2014-11-06 DIAGNOSIS — Z87438 Personal history of other diseases of male genital organs: Secondary | ICD-10-CM | POA: Insufficient documentation

## 2014-11-06 DIAGNOSIS — K257 Chronic gastric ulcer without hemorrhage or perforation: Secondary | ICD-10-CM | POA: Insufficient documentation

## 2014-11-06 DIAGNOSIS — I129 Hypertensive chronic kidney disease with stage 1 through stage 4 chronic kidney disease, or unspecified chronic kidney disease: Secondary | ICD-10-CM | POA: Insufficient documentation

## 2014-11-06 DIAGNOSIS — E785 Hyperlipidemia, unspecified: Secondary | ICD-10-CM

## 2014-11-06 DIAGNOSIS — M1A0610 Idiopathic chronic gout, right knee, without tophus (tophi): Secondary | ICD-10-CM | POA: Diagnosis not present

## 2014-11-06 DIAGNOSIS — N5089 Other specified disorders of the male genital organs: Secondary | ICD-10-CM | POA: Insufficient documentation

## 2014-11-06 DIAGNOSIS — E119 Type 2 diabetes mellitus without complications: Secondary | ICD-10-CM | POA: Insufficient documentation

## 2014-11-06 DIAGNOSIS — M2141 Flat foot [pes planus] (acquired), right foot: Secondary | ICD-10-CM | POA: Insufficient documentation

## 2014-11-06 DIAGNOSIS — M2142 Flat foot [pes planus] (acquired), left foot: Secondary | ICD-10-CM

## 2014-11-06 DIAGNOSIS — E782 Mixed hyperlipidemia: Secondary | ICD-10-CM | POA: Insufficient documentation

## 2014-11-06 DIAGNOSIS — E1159 Type 2 diabetes mellitus with other circulatory complications: Secondary | ICD-10-CM | POA: Insufficient documentation

## 2014-11-06 NOTE — Progress Notes (Signed)
Name: MARTRELL EGUIA   MRN: [id number]    DOB: 11/22/1940   Date:11/06/2014       Progress Note  Subjective  Chief Complaint  Chief Complaint  Patient presents with  . Follow-up    4 mo.  . Hypertension  . Hyperlipidemia  . Diabetes    HPI  Mr. Vonzell Lindblad. Batdorf is a 74 year old male with a past medical history significant for history of GI bleed, diet controlled Diabetes Type II, HLD, HTN, hypothyroidism. He was first diagnosed with Diabetes Type II several years ago and has been consistent with glycemic control through compliance with diet and activity appropriate for age.  Today he reports no acute symptoms or concerns.  Related symptoms  do not include fatigue, paresthesia, polydipsia, polyuria, wounds, ulcers and gastroparesis. For his hypothyroidism he is currently on levothyroxine 25 mcg a day.  Symptoms include fatigue but not worsening hair loss, constipation, skin changes or mood changes. For his HTN he is taking amlodipine 10 mg one a day with no reported chest pain, palpitations, edema. For his HLD he is taking Fenofibrate 145  mg one a day with no reported worsening myalgias or joint pains.  There has been no recent gouty arthritis flares and he continues to try and avoid foods that flare his gout. Doing well on allopurinol 100 mg 3 tabs a day.   Active Ambulatory Problems    Diagnosis Date Noted  . H/O: GI bleed 12/20/2013  . Chronic gout of ankle 08/16/2014  . Diabetes mellitus type 2, diet-controlled 11/06/2014  . History of prostatitis 11/06/2014  . Hypertension goal BP (blood pressure) < 140/90 11/06/2014  . Hyperlipidemia LDL goal <100 11/06/2014  . 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 Flat feet, bilateral 11/06/2014  . History of anemia 11/06/2014  . Chronic multiple gastric erosions 11/06/2014  . Scrotal swelling 11/06/2014  . 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 Need for immunization against  influenza 11/06/2014   Resolved Ambulatory Problems    Diagnosis Date Noted  . Acute renal insufficiency 12/23/2013  . Hypotension due to blood loss    Past Medical History  Diagnosis Date  . Hypertension   . Hypercholesteremia      Social History  Substance Use Topics  . Smoking status: Never Smoker   . Smokeless tobacco: Not on file  . Alcohol Use: No     Current outpatient prescriptions:  .  allopurinol (ZYLOPRIM) 100 MG tablet, Take 3 tablets (300 mg total) by mouth daily., Disp: 90 tablet, Rfl: 5 .  amLODipine (NORVASC) 10 MG tablet, TAKE ONE TABLET BY MOUTH ONCE DAILY, Disp: 90 tablet, Rfl: 3 .  Coenzyme Q10 (CO Q 10) 100 MG CAPS, Take 100 mg by mouth daily., Disp: , Rfl:  .  fenofibrate (TRICOR) 145 MG tablet, Take 145 mg by mouth every evening., Disp: , Rfl:  .  indomethacin (INDOCIN) 50 MG capsule, Take 50 mg by mouth 2 (two) times daily with a meal., Disp: , Rfl:  .  levothyroxine (SYNTHROID, LEVOTHROID) 25 MCG tablet, Take 1 tablet by mouth every morning., Disp: , Rfl:  .  Multiple Vitamin (ONE-A-DAY MENS PO), Take 1 tablet by mouth every morning., Disp: , Rfl:  .  Omega-3 Fatty Acids (FISH OIL) 1000 MG CAPS, Take 1,000 mg by mouth daily., Disp: , Rfl:  .  pantoprazole (PROTONIX) 40 MG tablet, Take 1 tablet (40 mg total) by mouth 2 (two)  times daily. Take twice daily for 1 month and then daily indefinitely, Disp: 180 tablet, Rfl: 0  Past Surgical History  Procedure Laterality Date  . Enteroscopy N/A 12/21/2013    Procedure: ENTEROSCOPY;  Surgeon: Vertell Novak., MD;  Location: [facility] ENDOSCOPY;  Service: Endoscopy;  Laterality: N/A;  . Enteroscopy N/A 12/27/2013    Procedure: ENTEROSCOPY;  Surgeon: Barrie Folk, MD;  Location: [facility] ENDOSCOPY;  Service: Endoscopy;  Laterality: N/A;  . Colonoscopy N/A 12/27/2013    Procedure: COLONOSCOPY;  Surgeon: Barrie Folk, MD;  Location: [facility] ENDOSCOPY;  Service: Endoscopy;  Laterality: N/A;    History reviewed. No pertinent  family history.  No Known Allergies   Review of Systems  CONSTITUTIONAL: No significant weight changes, fever, chills, weakness or fatigue.  HEENT:  - Eyes: No visual changes.  - Ears: No auditory changes. No pain.  - Nose: No sneezing, congestion, runny nose. - Throat: No sore throat. No changes in swallowing. SKIN: No rash or itching.  CARDIOVASCULAR: No chest pain, chest pressure or chest discomfort. No palpitations or edema.  RESPIRATORY: No shortness of breath, cough or sputum.  GASTROINTESTINAL: No anorexia, nausea, vomiting. No changes in bowel habits. No abdominal pain or blood.  GENITOURINARY: No dysuria. No frequency. No discharge.  NEUROLOGICAL: No headache, dizziness, syncope, paralysis, ataxia, numbness or tingling in the extremities. No memory changes. No change in bowel or bladder control.  MUSCULOSKELETAL: No joint pain. No muscle pain. HEMATOLOGIC: No anemia, bleeding or bruising.  LYMPHATICS: No enlarged lymph nodes.  PSYCHIATRIC: No change in mood. No change in sleep pattern.  ENDOCRINOLOGIC: No reports of sweating, cold or heat intolerance. No polyuria or polydipsia.     Objective  BP 118/80 mmHg  Pulse 80  Temp(Src) 98.1 F (36.7 C) (Oral)  Resp 18  Ht 5\' 8"  (1.727 m)  Wt 224 lb 4.8 oz (101.742 kg)  BMI 34.11 kg/m2  SpO2 93% Body mass index is 34.11 kg/(m^2).  Physical Exam  Constitutional: Patient appears well-developed and well-nourished. In no distress.  HEENT:  - Head: Normocephalic and atraumatic.  - Ears: Bilateral TMs gray, no erythema or effusion - Nose: Nasal mucosa moist - Mouth/Throat: Oropharynx is clear and moist. No tonsillar hypertrophy or erythema. No post nasal drainage.  - Eyes: Conjunctivae clear, EOM movements normal. PERRLA. No scleral icterus.  Neck: Normal range of motion. Neck supple. No JVD present. No thyromegaly present.  Cardiovascular: Normal rate, regular rhythm and normal heart sounds.  No murmur heard.   Pulmonary/Chest: Effort normal and breath sounds normal. No respiratory distress. Musculoskeletal: Normal range of motion bilateral UE and LE, no joint effusions. Peripheral vascular: Bilateral LE no edema. Neurological: CN II-XII grossly intact with no focal deficits. Alert and oriented to person, place, and time. Coordination, balance, strength, speech and gait are normal.  Skin: Skin is warm and dry. No rash noted. No erythema.  Psychiatric: Patient has a normal mood and affect. Behavior is normal in office today. Judgment and thought content normal in office today.    Assessment & Plan  1. Diabetes mellitus type 2, diet-controlled Patient's Hba1c goal is <8% as this is reasonable for the elderly population who is at risk for hypoglycemic events.   Encouraged patient to continue efforts on checking blood glucose on a daily basis. Fasting blood glucose control goal is 80-130mg /dL and post prandial blood glucose control is 180mg /dL.  Reviewed diet, exercise, lifestyle changes and current medication regimen pertaining to diabetes with the patient.  Reminded patient of the required annual dilated retinal exam.   - Hemoglobin A1c - Urine Microalbumin w/creat. ratio  2. Hypertension goal BP (blood pressure) < 140/90 JNC-8 vs ADA treatment goals discussed with patient. Current blood pressure well controled. Continue current regimen of anti-hypertensive medications.   3. Hyperlipidemia LDL goal <100 Continue Tricor.  4. Idiopathic chronic gout of right knee without tophus Well controled on Allopurinol 300 mg a day.  5. Need for immunization against influenza  - Flu vaccine High Dose PF

## 2014-11-07 LAB — HEMOGLOBIN A1C
Est. average glucose Bld gHb Est-mCnc: 146 mg/dL
Hgb A1c MFr Bld: 6.7 % — ABNORMAL HIGH (ref 4.8–5.6)

## 2014-11-07 LAB — MICROALBUMIN / CREATININE URINE RATIO
Creatinine, Urine: 103 mg/dL
MICROALB/CREAT RATIO: 60.7 mg/g creat — ABNORMAL HIGH (ref 0.0–30.0)
Microalbumin, Urine: 62.5 ug/mL

## 2014-12-20 ENCOUNTER — Other Ambulatory Visit: Payer: Self-pay | Admitting: Family Medicine

## 2015-01-02 ENCOUNTER — Other Ambulatory Visit: Payer: Self-pay

## 2015-01-02 MED ORDER — LEVOTHYROXINE SODIUM 25 MCG PO TABS: 25.0000 ug | ORAL_TABLET | ORAL | Status: DC

## 2015-01-02 NOTE — Telephone Encounter (Signed)
Got a fax from [facility] ([place], [place]) requesting a refill on this patient's Levothyroxine 25mcg, #30, 1 refill.  Refill request was sent to Dr. Alba CoryKrichna Sowles for approval and submission.

## 2015-03-10 ENCOUNTER — Ambulatory Visit: Payer: Medicare Other | Admitting: Family Medicine

## 2015-03-11 ENCOUNTER — Ambulatory Visit (INDEPENDENT_AMBULATORY_CARE_PROVIDER_SITE_OTHER): Payer: Medicare Other | Admitting: Family Medicine

## 2015-03-11 ENCOUNTER — Encounter: Payer: Self-pay | Admitting: Family Medicine

## 2015-03-11 VITALS — BP 120/82 | HR 66 | Temp 98.2°F | Resp 16 | Ht 68.0 in | Wt 224.7 lb

## 2015-03-11 DIAGNOSIS — E034 Atrophy of thyroid (acquired): Secondary | ICD-10-CM | POA: Diagnosis not present

## 2015-03-11 DIAGNOSIS — E038 Other specified hypothyroidism: Secondary | ICD-10-CM | POA: Diagnosis not present

## 2015-03-11 DIAGNOSIS — E119 Type 2 diabetes mellitus without complications: Secondary | ICD-10-CM

## 2015-03-11 DIAGNOSIS — E785 Hyperlipidemia, unspecified: Secondary | ICD-10-CM | POA: Diagnosis not present

## 2015-03-11 DIAGNOSIS — M1A0610 Idiopathic chronic gout, right knee, without tophus (tophi): Secondary | ICD-10-CM

## 2015-03-11 DIAGNOSIS — I10 Essential (primary) hypertension: Secondary | ICD-10-CM | POA: Diagnosis not present

## 2015-03-11 LAB — POCT GLYCOSYLATED HEMOGLOBIN (HGB A1C): Hemoglobin A1C: 6.6

## 2015-03-11 MED ORDER — LEVOTHYROXINE SODIUM 25 MCG PO TABS: 25.0000 ug | ORAL_TABLET | ORAL | Status: DC

## 2015-03-11 MED ORDER — ALLOPURINOL 100 MG PO TABS: 300.0000 mg | ORAL_TABLET | Freq: Every day | ORAL | Status: DC

## 2015-03-11 MED ORDER — FENOFIBRATE 145 MG PO TABS: 145.0000 mg | ORAL_TABLET | Freq: Every evening | ORAL | Status: DC

## 2015-03-11 NOTE — Progress Notes (Signed)
Name: Drew [name]   MRN: [id number]    DOB: 1940-12-01   Date:03/11/2015       Progress Note  Subjective  Chief Complaint  Chief Complaint  Patient presents with  . Medication Refill    follow-up  . Hyperlipidemia  . Hypothyroidism  . Hypertension  . Gastroesophageal Reflux  . Gout    comes and goes    HPI  Mr. Drew Albarran. [name] is a 75 year old male with a past medical history significant for history of GI bleed, diet controlled Diabetes Type II, HLD, HTN, hypothyroidism, Gout. He was first diagnosed with Diabetes Type II several years ago and has been consistent with glycemic control through compliance with diet and activity appropriate for age. Today he reports no acute symptoms or concerns. Related symptoms do not include fatigue, paresthesia, polydipsia, polyuria, wounds, ulcers and gastroparesis. For his hypothyroidism he is currently on levothyroxine 25 mcg a day. Symptoms include fatigue but not worsening hair loss, constipation, skin changes or mood changes. For his HTN he is taking amlodipine 10 mg one a day with no reported chest pain, palpitations, edema. For his HLD he is taking Fenofibrate 145 mg one a day with no reported worsening myalgias or joint pains. There has been no recent gouty arthritis flares and he continues to try and avoid foods that flare his gout. Doing well on allopurinol 100 mg 3 tabs a day.   Past Medical History  Diagnosis Date  . Hypertension   . Hypercholesteremia   . Diabetes mellitus without complication (HCC)   . GERD (gastroesophageal reflux disease)     Patient Active Problem List   Diagnosis Date Noted  . Diabetes mellitus type 2, diet-controlled (HCC) 11/06/2014  . History of prostatitis 11/06/2014  . Hypertension goal BP (blood pressure) < 140/90 11/06/2014  . Hyperlipidemia LDL goal <100 11/06/2014  . 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 Flat feet, bilateral 11/06/2014  . History of anemia 11/06/2014   . Chronic multiple gastric erosions 11/06/2014  . Scrotal swelling 11/06/2014  . 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 Need for immunization against influenza 11/06/2014  . Chronic gout of ankle 08/16/2014  . H/O: GI bleed 12/20/2013    Social History  Substance Use Topics  . Smoking status: Never Smoker   . Smokeless tobacco: Not on file  . Alcohol Use: No     Current outpatient prescriptions:  .  allopurinol (ZYLOPRIM) 100 MG tablet, Take 3 tablets (300 mg total) by mouth daily., Disp: 90 tablet, Rfl: 5 .  amLODipine (NORVASC) 10 MG tablet, TAKE ONE TABLET BY MOUTH ONCE DAILY, Disp: 90 tablet, Rfl: 3 .  Coenzyme Q10 (CO Q 10) 100 MG CAPS, Take 100 mg by mouth daily., Disp: , Rfl:  .  fenofibrate (TRICOR) 145 MG tablet, Take 145 mg by mouth every evening., Disp: , Rfl:  .  indomethacin (INDOCIN) 50 MG capsule, Take 50 mg by mouth 2 (two) times daily with a meal., Disp: , Rfl:  .  levothyroxine (SYNTHROID, LEVOTHROID) 25 MCG tablet, Take 1 tablet (25 mcg total) by mouth every morning., Disp: 30 tablet, Rfl: 2 .  Melatonin 10 MG TABS, Take 1 tablet by mouth., Disp: , Rfl:  .  Multiple Vitamin (ONE-A-DAY MENS PO), Take 1 tablet by mouth every morning., Disp: , Rfl:  .  Omega-3 Fatty Acids (FISH OIL) 1000 MG CAPS, Take 1,000 mg by mouth daily., Disp: , Rfl:  .  pantoprazole (PROTONIX) 40 MG tablet, TAKE ONE TABLET BY MOUTH ONCE DAILY, Disp: 90 tablet, Rfl: 3  Past Surgical History  Procedure Laterality Date  . Enteroscopy N/A 12/21/2013    Procedure: ENTEROSCOPY;  Surgeon: Vertell Novak., MD;  Location: [facility] ENDOSCOPY;  Service: Endoscopy;  Laterality: N/A;  . Enteroscopy N/A 12/27/2013    Procedure: ENTEROSCOPY;  Surgeon: Barrie Folk, MD;  Location: [facility] ENDOSCOPY;  Service: Endoscopy;  Laterality: N/A;  . Colonoscopy N/A 12/27/2013    Procedure: COLONOSCOPY;  Surgeon: Barrie Folk, MD;  Location: [facility] ENDOSCOPY;  Service: Endoscopy;   Laterality: N/A;    Family History  Problem Relation Age of Onset  . Hyperlipidemia Father     No Known Allergies   Review of Systems  CONSTITUTIONAL: No significant weight changes, fever, chills, weakness or fatigue.  CARDIOVASCULAR: No chest pain, chest pressure or chest discomfort. No palpitations or edema.  RESPIRATORY: No shortness of breath, cough or sputum.  NEUROLOGICAL: No headache, dizziness, syncope, paralysis, ataxia, numbness or tingling in the extremities. No memory changes. No change in bowel or bladder control.  MUSCULOSKELETAL: Occasional joint pain. No muscle pain. HEMATOLOGIC: No anemia, bleeding or bruising.  LYMPHATICS: No enlarged lymph nodes.  PSYCHIATRIC: No change in mood. No change in sleep pattern.  ENDOCRINOLOGIC: No reports of sweating, cold or heat intolerance. No polyuria or polydipsia.     Objective  BP 120/82 mmHg  Pulse 66  Temp(Src) 98.2 F (36.8 C) (Oral)  Resp 16  Ht  (1.727 m)  Wt 224 lb 11.2 oz (101.923 kg)  BMI 34.17 kg/m2  SpO2 95% Body mass index is 34.17 kg/(m^2).  Physical Exam  Constitutional: Patient appears well-developed and well-nourished. In no distress.  Neck: Normal range of motion. Neck supple. No JVD present. No thyromegaly present.  Cardiovascular: Normal rate, regular rhythm and normal heart sounds.  No murmur heard.  Pulmonary/Chest: Effort normal and breath sounds normal. No respiratory distress. Musculoskeletal: Normal range of motion bilateral UE and LE, no joint effusions. Peripheral vascular: Bilateral LE no edema. Neurological: CN II-XII grossly intact with no focal deficits. Alert and oriented to person, place, and time. Coordination, balance, strength, speech and gait are normal.  Skin: Skin is warm and dry. No rash noted. No erythema.  Psychiatric: Patient has a normal mood and affect. Behavior is normal in office today. Judgment and thought content normal in office today.  Results for orders  placed or performed in visit on 03/11/15 (from the past 24 hour(s))  POCT HgB A1C     Status: Normal   Collection Time: 03/11/15  9:30 AM  Result Value Ref Range   Hemoglobin A1C 6.6      Assessment & Plan   1. Hypertension goal BP (blood pressure) < 140/90 Stable, continue current medication dose.   2. Diabetes mellitus type 2, diet-controlled (HCC)  Patient's Hba1c goal is <8% as this is reasonable for the elderly population who is at risk for hypoglycemic events.   Encouraged patient to continue efforts on checking blood glucose on a daily basis. Fasting blood glucose control goal is 80-130mg /dL and post prandial blood glucose control is 180mg /dL.  Reviewed diet, exercise, lifestyle changes and current medication regimen pertaining to diabetes with the patient.   Reminded patient of the required annual dilated retinal exam.   - POCT HgB A1C  3. Hyperlipidemia LDL goal <100 Stable, continue current medication dose.   - fenofibrate (TRICOR) 145 MG tablet; Take 1 tablet (145 mg total)  by mouth every evening.  Dispense: 90 tablet; Refill: 2  4. Idiopathic chronic gout of right knee without tophus Stable, continue current medication dose.   - allopurinol (ZYLOPRIM) 100 MG tablet; Take 3 tablets (300 mg total) by mouth daily.  Dispense: 270 tablet; Refill: 2  5. Hypothyroidism due to acquired atrophy of thyroid Stable, continue current medication dose.   - levothyroxine (SYNTHROID, LEVOTHROID) 25 MCG tablet; Take 1 tablet (25 mcg total) by mouth every morning.  Dispense: 90 tablet; Refill: 2

## 2015-08-07 ENCOUNTER — Ambulatory Visit (INDEPENDENT_AMBULATORY_CARE_PROVIDER_SITE_OTHER): Payer: Medicare Other | Admitting: Family Medicine

## 2015-08-07 ENCOUNTER — Encounter: Payer: Self-pay | Admitting: Family Medicine

## 2015-08-07 ENCOUNTER — Other Ambulatory Visit: Payer: Self-pay | Admitting: Family Medicine

## 2015-08-07 VITALS — BP 138/70 | HR 83 | Temp 98.6°F | Resp 18 | Ht 68.0 in | Wt 224.0 lb

## 2015-08-07 DIAGNOSIS — E034 Atrophy of thyroid (acquired): Secondary | ICD-10-CM

## 2015-08-07 DIAGNOSIS — Z5181 Encounter for therapeutic drug level monitoring: Secondary | ICD-10-CM | POA: Diagnosis not present

## 2015-08-07 DIAGNOSIS — Z862 Personal history of diseases of the blood and blood-forming organs and certain disorders involving the immune mechanism: Secondary | ICD-10-CM | POA: Diagnosis not present

## 2015-08-07 DIAGNOSIS — M109 Gout, unspecified: Secondary | ICD-10-CM

## 2015-08-07 DIAGNOSIS — N183 Chronic kidney disease, stage 3 unspecified: Secondary | ICD-10-CM

## 2015-08-07 DIAGNOSIS — E038 Other specified hypothyroidism: Secondary | ICD-10-CM | POA: Diagnosis not present

## 2015-08-07 DIAGNOSIS — E785 Hyperlipidemia, unspecified: Secondary | ICD-10-CM | POA: Diagnosis not present

## 2015-08-07 DIAGNOSIS — I129 Hypertensive chronic kidney disease with stage 1 through stage 4 chronic kidney disease, or unspecified chronic kidney disease: Secondary | ICD-10-CM

## 2015-08-07 DIAGNOSIS — E119 Type 2 diabetes mellitus without complications: Secondary | ICD-10-CM

## 2015-08-07 DIAGNOSIS — M10079 Idiopathic gout, unspecified ankle and foot: Secondary | ICD-10-CM

## 2015-08-07 DIAGNOSIS — E669 Obesity, unspecified: Secondary | ICD-10-CM

## 2015-08-07 HISTORY — DX: Obesity, unspecified: E66.9

## 2015-08-07 LAB — TSH: TSH: 1.92 mIU/L (ref 0.40–4.50)

## 2015-08-07 LAB — POCT GLYCOSYLATED HEMOGLOBIN (HGB A1C): Hemoglobin A1C: 6.8

## 2015-08-07 NOTE — Assessment & Plan Note (Signed)
Check CBC today.  

## 2015-08-07 NOTE — Assessment & Plan Note (Addendum)
Check A1c today; foot exam by MD; encouraged 20 pounds of weight loss

## 2015-08-07 NOTE — Patient Instructions (Addendum)
Please do see your eye doctor regularly, and have your eyes examined every year (or more often per his or her recommendation) Check your feet every night and let me know right away of any sores, infections, numbness, etc. Try to limit sweets, white bread, white rice, white potatoes It is okay with me for you to not check your fingerstick blood sugars (per [employer] Best Practices), unless you are interested and feel it would be helpful for you  Try to limit saturated fats in your diet (bologna, hot dogs, barbeque, cheeseburgers, hamburgers, steak, bacon, sausage, cheese, etc.) and get more fresh fruits, vegetables, and whole grains  Check out the information at familydoctor.org entitled "Nutrition for Weight Loss: What You Need to Know about Fad Diets" Try to lose between 1-2 pounds per week by taking in fewer calories and burning off more calories You can succeed by limiting portions, limiting foods dense in calories and fat, becoming more active, and drinking 8 glasses of water a day (64 ounces) Don't skip meals, especially breakfast, as skipping meals may alter your metabolism Do not use over-the-counter weight loss pills or gimmicks that claim rapid weight loss A healthy BMI (or body mass index) is between 18.5 and 24.9 You can calculate your ideal BMI at the NIH website [employer]huhttp://www.nhlbi.nih.gov/health/educational/lose_wt/BMI/bmicalc.htm Let's shoot for 20 pounds of weight over the next 6 months  Let's get labs today If you have not heard anything from my staff in a week about any orders/referrals/studies from today, please contact us here to follow-up [phone]  Lay off the salt shaker and let me know if headaches don't go away staying off the salt

## 2015-08-07 NOTE — Assessment & Plan Note (Signed)
Work on 20 pounds of weight loss prior to next visit in 6 months; single most important thing he can do to prevent progression of diabetes to higher A1c

## 2015-08-07 NOTE — Assessment & Plan Note (Addendum)
Check lipids today; limit eggs and saturated fats; I'm not sure why patient isn't on a statin; will check lipids today; reviewed records; I'd rather him be on a statin instead of fibric acid derivative, but will check labs and records

## 2015-08-07 NOTE — Progress Notes (Signed)
BP 138/70 mmHg  Pulse 83  Temp(Src) 98.6 F (37 C) (Oral)  Resp 18  Ht  (1.727 m)  Wt 224 lb (101.606 kg)  BMI 34.07 kg/m2  SpO2 95%   Subjective:    Patient ID: Drew [name], male    DOB: 06/27/1940, 75 y.o.   MRN: [id number]  HPI: Drew [name] is a 75 y.o. male  Chief Complaint  Patient presents with  . Medication Refill    6 month F/U  . Hypertension    Patient has been having headaches recently  . Diabetes    Meter has been broke and needs a new one.   . Hyperlipidemia  . Gout    Had a flair up 3 weeks ago    Patient is new to me; his previous provider left the practice  High cholesterol Occasionally drinks milk, just with cornbread; 2% milk; does eat eggs He is on fenofibrate but no statin  Diet controlled diabetes [place] medicines [place]; not checking sugars; meter broke No trouble with feet, no numbness or sores  Gout flare 3 weeks ago; flare after 'mater sandwich; legs were burning and stinging; both legs (?); both legs were itchy, rubbed it with alcohol, cooled it off, cooled them down and that was it; no redness or swelling of joints; usually in big toes  HTN; few headaches lately; does put salt on his 'mater sandwiches;    Depression screen [facility] 2/9 08/07/2015 03/11/2015 11/06/2014  Decreased Interest 0 0 0  Down, Depressed, Hopeless 0 0 0  PHQ - 2 Score 0 0 0   Relevant past medical, surgical, family and social history reviewed Past Medical History  Diagnosis Date  . Hypertension   . Hypercholesteremia   . Diabetes mellitus without complication (HCC)   . GERD (gastroesophageal reflux disease)   . Obesity (BMI 30-39.9) 08/07/2015  . H/O: upper GI bleed 2015  . Chronic kidney disease   . Chronic kidney disease, stage III (moderate) 08/15/2015   Past Surgical History  Procedure Laterality Date  . Enteroscopy N/A 12/21/2013    Procedure: ENTEROSCOPY;  Surgeon: Vertell Novak., MD;  Location: [facility]  ENDOSCOPY;  Service: Endoscopy;  Laterality: N/A;  . Enteroscopy N/A 12/27/2013    Procedure: ENTEROSCOPY;  Surgeon: Barrie Folk, MD;  Location: [facility] ENDOSCOPY;  Service: Endoscopy;  Laterality: N/A;  . Colonoscopy N/A 12/27/2013    Procedure: COLONOSCOPY;  Surgeon: Barrie Folk, MD;  Location: [facility] ENDOSCOPY;  Service: Endoscopy;  Laterality: N/A;   Family History  Problem Relation Age of Onset  . Hyperlipidemia Father    Social History  Substance Use Topics  . Smoking status: Never Smoker   . Smokeless tobacco: None  . Alcohol Use: No   Interim medical history since last visit reviewed. NKDA listed  Review of Systems  Constitutional: Negative for unexpected weight change.  Cardiovascular: Negative for chest pain.  Gastrointestinal: Negative for blood in stool.   Per HPI unless specifically indicated above     Objective:    BP 138/70 mmHg  Pulse 83  Temp(Src) 98.6 F (37 C) (Oral)  Resp 18  Ht  (1.727 m)  Wt 224 lb (101.606 kg)  BMI 34.07 kg/m2  SpO2 95%  Wt Readings from Last 3 Encounters:  08/07/15 224 lb (101.606 kg)  03/11/15 224 lb 11.2 oz (101.923 kg)  11/06/14 224 lb 4.8 oz (101.742 kg)    Physical  Exam  Constitutional: He appears well-developed and well-nourished. No distress.  HENT:  Head: Normocephalic and atraumatic.  Eyes: EOM are normal. No scleral icterus.  Neck: No thyromegaly present.  Cardiovascular: Normal rate and regular rhythm.   Pulmonary/Chest: Effort normal and breath sounds normal.  Abdominal: Soft. Bowel sounds are normal. He exhibits no distension.  Musculoskeletal: He exhibits no edema.  Neurological: Coordination normal.  Skin: Skin is warm and dry. No pallor.  Psychiatric: He has a normal mood and affect. His behavior is normal. Judgment and thought content normal.   Diabetic Foot Form - Detailed   Diabetic Foot Exam - detailed  Diabetic Foot exam was performed with the following findings:  Yes 08/07/2015 10:04 AM  Visual Foot  Exam completed.:  Yes  Are the toenails long?:  No  Are the toenails thick?:  No (Comment: separation of right great toenail)  Are the toenails ingrown?:  No  Normal Range of Motion:  Yes    Pulse Foot Exam completed.:  Yes  Right Dorsalis Pedis:  Present Left Dorsalis Pedis:  Present  Sensory Foot Exam Completed.:  Yes  Swelling:  No  Semmes-Weinstein Monofilament Test  R Site 1-Great Toe:  Pos L Site 1-Great Toe:  Pos  R Site 4:  Pos L Site 4:  Pos  R Site 5:  Pos L Site 5:  Pos        Results for orders placed or performed in visit on 08/07/15  POCT HgB A1C  Result Value Ref Range   Hemoglobin A1C 6.8       Assessment & Plan:   Problem List Items Addressed This Visit      Endocrine   Diabetes mellitus type 2, diet-controlled (HCC) - Primary    Check A1c today; foot exam by MD; encouraged 20 pounds of weight loss      Relevant Orders   POCT HgB A1C (Completed)   Microalbumin / creatinine urine ratio   Hypothyroidism due to acquired atrophy of thyroid    Check TSH      Relevant Orders   TSH     Genitourinary   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    Check creatinine and GFR today; avoid NSAIDs, okay to take tylenol if needed per package directions        Other   Gout   Relevant Orders   Uric acid   History of anemia    Check CBC today      Relevant Orders   CBC with Differential/Platelet   Hyperlipidemia LDL goal <100    Check lipids today; limit eggs and saturated fats; I'm not sure why patient isn't on a statin; will check lipids today; reviewed records; I'd rather him be on a statin instead of fibric acid derivative, but will check labs and records      Relevant Orders   Lipid Panel w/Direct LDL:HDL Ratio   Medication monitoring encounter   Relevant Orders   COMPLETE METABOLIC PANEL WITH GFR   Obesity (BMI 30-39.9)    Work on 20 pounds of weight loss prior to next visit in 6 months; single most important thing he  can do to prevent progression of diabetes to higher A1c         Follow up plan: Return in about 6 months (around 02/06/2016) for fasting labs and visit for diabetes, cholesterol.  An after-visit summary was printed and given to the patient at check-out.  Please see the patient instructions which  may contain other information and recommendations beyond what is mentioned above in the assessment and plan.  Orders Placed This Encounter  Procedures  . COMPLETE METABOLIC PANEL WITH GFR  . Lipid Panel w/Direct LDL:HDL Ratio  . TSH  . Uric acid  . Microalbumin / creatinine urine ratio  . CBC with Differential/Platelet  . POCT HgB A1C

## 2015-08-07 NOTE — Assessment & Plan Note (Signed)
Check TSH 

## 2015-08-07 NOTE — Assessment & Plan Note (Signed)
Check creatinine and GFR today; avoid NSAIDs, okay to take tylenol if needed per package directions

## 2015-08-08 ENCOUNTER — Ambulatory Visit: Payer: Medicare Other | Admitting: Family Medicine

## 2015-08-08 LAB — COMPREHENSIVE METABOLIC PANEL
ALBUMIN: 4.5 g/dL (ref 3.6–5.1)
ALK PHOS: 65 U/L (ref 40–115)
ALT: 36 U/L (ref 9–46)
AST: 44 U/L — ABNORMAL HIGH (ref 10–35)
BUN: 14 mg/dL (ref 7–25)
CALCIUM: 9.9 mg/dL (ref 8.6–10.3)
CO2: 21 mmol/L (ref 20–31)
Chloride: 106 mmol/L (ref 98–110)
Creat: 1.55 mg/dL — ABNORMAL HIGH (ref 0.70–1.18)
GLUCOSE: 119 mg/dL — AB (ref 65–99)
POTASSIUM: 4.2 mmol/L (ref 3.5–5.3)
Sodium: 142 mmol/L (ref 135–146)
Total Bilirubin: 0.7 mg/dL (ref 0.2–1.2)
Total Protein: 7 g/dL (ref 6.1–8.1)

## 2015-08-08 LAB — LIPID PANEL
CHOLESTEROL: 203 mg/dL — AB (ref 125–200)
HDL: 25 mg/dL — AB (ref >=40)
LDL Cholesterol: 127 mg/dL (ref <130)
Total CHOL/HDL Ratio: 8.1 Ratio — ABNORMAL HIGH (ref <=5.0)
Triglycerides: 254 mg/dL — ABNORMAL HIGH (ref <150)
VLDL: 51 mg/dL — ABNORMAL HIGH (ref <30)

## 2015-08-08 LAB — URIC ACID: Uric Acid, Serum: 4.7 mg/dL (ref 4.0–8.0)

## 2015-08-08 LAB — MICROALBUMIN / CREATININE URINE RATIO
Creatinine, Urine: 140 mg/dL (ref 20–370)
MICROALB UR: 16.7 mg/dL — AB
Microalb Creat Ratio: 119 mcg/mg creat — ABNORMAL HIGH (ref <30)

## 2015-08-15 ENCOUNTER — Telehealth: Payer: Self-pay

## 2015-08-15 DIAGNOSIS — Z5181 Encounter for therapeutic drug level monitoring: Secondary | ICD-10-CM

## 2015-08-15 DIAGNOSIS — E785 Hyperlipidemia, unspecified: Secondary | ICD-10-CM

## 2015-08-15 DIAGNOSIS — Z8719 Personal history of other diseases of the digestive system: Secondary | ICD-10-CM | POA: Insufficient documentation

## 2015-08-15 DIAGNOSIS — N1831 Chronic kidney disease, stage 3a: Secondary | ICD-10-CM | POA: Insufficient documentation

## 2015-08-15 DIAGNOSIS — N183 Chronic kidney disease, stage 3 unspecified: Secondary | ICD-10-CM

## 2015-08-15 HISTORY — DX: Chronic kidney disease, stage 3 unspecified: N18.30

## 2015-08-15 NOTE — Telephone Encounter (Signed)
Please review blood work

## 2015-08-15 NOTE — Assessment & Plan Note (Signed)
Check labs in 2 months, serious attempt at weight loss first; mild sgpt elev may be fatty liver

## 2015-08-15 NOTE — Telephone Encounter (Signed)
I talked with pt about his labs He believes he had an allergic reaction to some cholesterol medicine a while ago; he is not on a statin; I found niaspan and zetia in his med list; I can only imagine that further back, someone would have tried him on a statin prior to trying zetia; he is not sure No allergies listed Kidney function not as good as before; chart reviewed; creatinine was in the 2's back in 2015; he was very sick with GI bleed; overall, stable over the last year; will watch; hydrate and avoid NSAIDs; indomethacin was in his med list, but patient says he's not taking that (I remove it) Urine protein mildly elevated; I do not know why he's not on an ACE-I or ARB; no allergic reactions to either of those listed; will recheck urine microalbumin:Cr in 2 months; avoid red meat, hydrate A1c was 6.8 TSH normal Really watch diet; try to lose 20 pounds over the next few months rehceck fasting lipids in 2 months; pt agrees

## 2015-08-15 NOTE — Assessment & Plan Note (Signed)
Check lipids in 2 months after serious effort at weight loss, diet

## 2015-10-20 ENCOUNTER — Other Ambulatory Visit: Payer: Self-pay

## 2015-10-20 DIAGNOSIS — E785 Hyperlipidemia, unspecified: Secondary | ICD-10-CM

## 2015-10-20 DIAGNOSIS — Z5181 Encounter for therapeutic drug level monitoring: Secondary | ICD-10-CM

## 2015-10-20 DIAGNOSIS — N183 Chronic kidney disease, stage 3 unspecified: Secondary | ICD-10-CM

## 2015-10-20 LAB — LIPID PANEL
Cholesterol: 200 mg/dL (ref 125–200)
HDL: 24 mg/dL — ABNORMAL LOW (ref >=40)
LDL CALC: 128 mg/dL (ref <130)
TRIGLYCERIDES: 241 mg/dL — AB (ref <150)
Total CHOL/HDL Ratio: 8.3 Ratio — ABNORMAL HIGH (ref <=5.0)
VLDL: 48 mg/dL — ABNORMAL HIGH (ref <30)

## 2015-10-20 LAB — COMPLETE METABOLIC PANEL WITH GFR
ALT: 37 U/L (ref 9–46)
AST: 34 U/L (ref 10–35)
Albumin: 4.8 g/dL (ref 3.6–5.1)
Alkaline Phosphatase: 70 U/L (ref 40–115)
BUN: 16 mg/dL (ref 7–25)
CO2: 25 mmol/L (ref 20–31)
Calcium: 9.9 mg/dL (ref 8.6–10.3)
Chloride: 105 mmol/L (ref 98–110)
Creat: 1.52 mg/dL — ABNORMAL HIGH (ref 0.70–1.18)
GFR, Est African American: 51 mL/min — ABNORMAL LOW (ref >=60)
GFR, Est Non African American: 44 mL/min — ABNORMAL LOW (ref >=60)
GLUCOSE: 125 mg/dL — AB (ref 65–99)
Potassium: 4.3 mmol/L (ref 3.5–5.3)
SODIUM: 142 mmol/L (ref 135–146)
TOTAL PROTEIN: 7.2 g/dL (ref 6.1–8.1)
Total Bilirubin: 0.7 mg/dL (ref 0.2–1.2)

## 2015-10-30 ENCOUNTER — Telehealth: Payer: Self-pay | Admitting: Family Medicine

## 2015-10-30 DIAGNOSIS — N183 Chronic kidney disease, stage 3 unspecified: Secondary | ICD-10-CM

## 2015-10-30 DIAGNOSIS — M1A0610 Idiopathic chronic gout, right knee, without tophus (tophi): Secondary | ICD-10-CM

## 2015-10-30 DIAGNOSIS — M1A0710 Idiopathic chronic gout, right ankle and foot, without tophus (tophi): Secondary | ICD-10-CM

## 2015-10-30 NOTE — Telephone Encounter (Signed)
I called about lab results; left message

## 2015-10-31 MED ORDER — ALLOPURINOL 100 MG PO TABS: 200.0000 mg | ORAL_TABLET | Freq: Every day | ORAL | 0 refills | Status: DC

## 2015-10-31 NOTE — Telephone Encounter (Signed)
GFR is 44 Stop advil or another NSAIDs Refer to nephrologist Okay to take tylenol Decrease allopurinol 200 mg He may switch BP medicine Tried lipitor but couldn't take it; felt like he ran out gas; arms giving out Taking fish oil one pill twice a day; increase to two pills twice a day Limit saturated fats Try to lose 10-15 pounds He bends forward and then gets dizzy; call Monday to schedule appt but go to urgent care Check BP at home and if low, please call doctor on-call and hold BP medicine Discussed briefly ddx: anemia, hypotension, carotid artery disease, dehydration, etc He had lifeline scan done last year; bring that copy to appt We'll see what his pressure is and I'll likely change CCB to ACE-I at visit

## 2015-10-31 NOTE — Assessment & Plan Note (Signed)
Refer to nephrologist; reduce allopurinol, hydrate; avoid NSAIDs

## 2015-10-31 NOTE — Assessment & Plan Note (Signed)
Refer to nephrologist; decrease allopurinol

## 2015-11-17 ENCOUNTER — Other Ambulatory Visit: Payer: Self-pay

## 2015-11-17 DIAGNOSIS — I10 Essential (primary) hypertension: Secondary | ICD-10-CM

## 2015-11-18 MED ORDER — AMLODIPINE BESYLATE 10 MG PO TABS: 10.0000 mg | ORAL_TABLET | Freq: Every day | ORAL | 3 refills | Status: DC

## 2015-11-18 NOTE — Telephone Encounter (Signed)
rx approved

## 2015-11-24 ENCOUNTER — Ambulatory Visit (INDEPENDENT_AMBULATORY_CARE_PROVIDER_SITE_OTHER): Payer: Medicare Other | Admitting: Family Medicine

## 2015-11-24 ENCOUNTER — Encounter: Payer: Self-pay | Admitting: Family Medicine

## 2015-11-24 VITALS — BP 116/60 | HR 81 | Temp 98.3°F | Resp 14 | Wt 221.5 lb

## 2015-11-24 DIAGNOSIS — R6889 Other general symptoms and signs: Secondary | ICD-10-CM

## 2015-11-24 DIAGNOSIS — M109 Gout, unspecified: Secondary | ICD-10-CM | POA: Diagnosis not present

## 2015-11-24 DIAGNOSIS — E669 Obesity, unspecified: Secondary | ICD-10-CM | POA: Diagnosis not present

## 2015-11-24 DIAGNOSIS — Z23 Encounter for immunization: Secondary | ICD-10-CM | POA: Diagnosis not present

## 2015-11-24 DIAGNOSIS — R42 Dizziness and giddiness: Secondary | ICD-10-CM | POA: Diagnosis not present

## 2015-11-24 MED ORDER — AMLODIPINE BESYLATE 5 MG PO TABS: 5.0000 mg | ORAL_TABLET | Freq: Every day | ORAL | 3 refills | Status: DC

## 2015-11-24 NOTE — Patient Instructions (Addendum)
Stay well-hydrated Decrease the amlodipine from 10 mg to 5 mg daily Sit a moment after leaning forward before you try to stand Try to avoid Mal[place]key and animal gravies and liver and shrimp Avoid chicken soup and beef stock If you are going to have soup, make sure it's made from vegetable stock Avoid sausage and bacon and beef Chicken no more than 4 x a week Try tart cherry to help with gout Try to lose 10 pounds by January 1st Try to get up and move more, every 15-30 minutes Return in December

## 2015-11-24 NOTE — Progress Notes (Signed)
BP 116/60   Pulse 81   Temp 98.3 F (36.8 C) (Oral)   Resp 14   Wt 221 lb 8 oz (100.5 kg)   SpO2 95%   BMI 33.68 kg/m    Subjective:    Patient ID: Drew [name], male    DOB: [birth year], 75 y.o.   MRN: [id number]  HPI: MATTIX IMHOF is a 75 y.o. male  Chief Complaint  Patient presents with  . Dizziness   He broke his upper plate and has been to the dentist; they are helping him with that and they took the impressions; he goes back Thursday to show him some teeth He is still eating okay  He was dizzy a while back; when he bends over to tie his shoe, he'll sit up quickly and then gets dizzy when sitting back up and then standing up; felt light-headed; happened twice after bending forward to tie shoes, and then sitting up and standing up got him light-headed; wife was in a hurry; she's always in a hurry  He loves tomato sandwiches He has gout; last uric acid was 4.7 in June  He brought in [employer] Screening with him; he had abnormal ABIs; left side 1.28, right 1.39; he denies cold feet; toes do not go purple; no pain  Depression screen [facility] 2/9 11/24/2015 08/07/2015 03/11/2015 11/06/2014  Decreased Interest 0 0 0 0  Down, Depressed, Hopeless 0 0 0 0  PHQ - 2 Score 0 0 0 0   Relevant past medical, surgical, family and social history reviewed Past Medical History:  Diagnosis Date  . Chronic kidney disease   . Chronic kidney disease, stage III (moderate) 08/15/2015  . Diabetes mellitus without complication (HCC)   . GERD (gastroesophageal reflux disease)   . H/O: upper GI bleed 2015  . Hypercholesteremia   . Hypertension   . Obesity (BMI 30-39.9) 08/07/2015   Past Surgical History:  Procedure Laterality Date  . COLONOSCOPY N/A 12/27/2013   Procedure: COLONOSCOPY;  Surgeon: Barrie Folk, MD;  Location: [facility] ENDOSCOPY;  Service: Endoscopy;  Laterality: N/A;  . ENTEROSCOPY N/A 12/21/2013   Procedure: ENTEROSCOPY;  Surgeon: Vertell Novak., MD;  Location: [facility] ENDOSCOPY;   Service: Endoscopy;  Laterality: N/A;  . ENTEROSCOPY N/A 12/27/2013   Procedure: ENTEROSCOPY;  Surgeon: Barrie Folk, MD;  Location: [facility] ENDOSCOPY;  Service: Endoscopy;  Laterality: N/A;   Family History  Problem Relation Age of Onset  . Hyperlipidemia Father    Social History  Substance Use Topics  . Smoking status: Never Smoker  . Smokeless tobacco: Not on file  . Alcohol use No   Interim medical history since last visit reviewed. Allergies and medications reviewed  Review of Systems Per HPI unless specifically indicated above     Objective:    BP 116/60   Pulse 81   Temp 98.3 F (36.8 C) (Oral)   Resp 14   Wt 221 lb 8 oz (100.5 kg)   SpO2 95%   BMI 33.68 kg/m   Wt Readings from Last 3 Encounters:  11/24/15 221 lb 8 oz (100.5 kg)  08/07/15 224 lb (101.6 kg)  03/11/15 224 lb 11.2 oz (101.9 kg)    Physical Exam  Constitutional: He appears well-developed and well-nourished. No distress.  HENT:  Head: Normocephalic and atraumatic.  Eyes: EOM are normal. No scleral icterus.  Neck: No JVD present. Carotid bruit is not present. No thyromegaly present.  Cardiovascular: Normal rate and regular rhythm.  Pulses:      Dorsalis pedis pulses are 1+ on the right side, and 1+ on the left side.  Feet are not cool or mottled  Pulmonary/Chest: Effort normal and breath sounds normal.  Musculoskeletal: He exhibits no edema.  Neurological: He is alert.  Skin: Skin is warm and dry. No pallor.  Psychiatric: He has a normal mood and affect. His behavior is normal. Judgment and thought content normal. His mood appears not anxious. He does not exhibit a depressed mood.   Results for orders placed or performed in visit on 10/20/15  COMPLETE METABOLIC PANEL WITH GFR  Result Value Ref Range   Sodium 142 135 - 146 mmol/L   Potassium 4.3 3.5 - 5.3 mmol/L   Chloride 105 98 - 110 mmol/L   CO2 25 20 - 31 mmol/L   Glucose, Bld 125 (H) 65 - 99 mg/dL   BUN 16 7 - 25 mg/dL   Creat 0.861.52 (H)  5.780.70 - 1.18 mg/dL   Total Bilirubin 0.7 0.2 - 1.2 mg/dL   Alkaline Phosphatase 70 40 - 115 U/L   AST 34 10 - 35 U/L   ALT 37 9 - 46 U/L   Total Protein 7.2 6.1 - 8.1 g/dL   Albumin 4.8 3.6 - 5.1 g/dL   Calcium 9.9 8.6 - 46.910.3 mg/dL   GFR, Est African American 51 (L) >=60 mL/min   GFR, Est Non African American 44 (L) >=60 mL/min  Lipid panel  Result Value Ref Range   Cholesterol 200 125 - 200 mg/dL   Triglycerides 629241 (H) <150 mg/dL   HDL 24 (L) >=52>=40 mg/dL   Total CHOL/HDL Ratio 8.3 (H) <=5.0 Ratio   VLDL 48 (H) <30 mg/dL   LDL Cholesterol 841128 <324<130 mg/dL      Assessment & Plan:   Problem List Items Addressed This Visit      Other   Obesity (BMI 30-39.9) (Chronic)    Also noted on the [employer] Screening; encouraged him to cut down, work on weight loss      Light-headedness - Primary    I believe this is postural, related to bending forward, then sitting up and standing up too quickly; will reduce his BP medicine; have him put feet up on stool to tie shoes rather than bend forward at the waist,e.g., give himself a minute after sitting before jumping up and walking; hydration; call if not improving      Gout (Chronic)    Talked with patient about upcoming holidays; avoid or at least significantly limit Mal[place]key, gravy, chicken soup, etc      Abnormal ankle brachial index (ABI)    Abnormal ABI on [facility] Screening; will get arterial dopplers at the hospital      Relevant Orders   VAS US LE ART SEG MULTI (Segm&LE Reynauds)    Other Visit Diagnoses    Needs flu shot       Relevant Orders   Flu vaccine HIGH DOSE PF (Fluzone High dose) (Completed)       Follow up plan: No Follow-up on file.  An after-visit summary was printed and given to the patient at check-out.  Please see the patient instructions which may contain other information and recommendations beyond what is mentioned above in the assessment and plan.  Meds ordered this encounter  Medications  . amLODipine  (NORVASC) 5 MG tablet    Sig: Take 1 tablet (5 mg total) by mouth daily.    Dispense:  90 tablet  Refill:  3    New lower dose; CANCEL the 10 mg strength    Orders Placed This Encounter  Procedures  . Flu vaccine HIGH DOSE PF (Fluzone High dose)

## 2015-11-26 DIAGNOSIS — R42 Dizziness and giddiness: Secondary | ICD-10-CM | POA: Insufficient documentation

## 2015-11-26 DIAGNOSIS — R6889 Other general symptoms and signs: Secondary | ICD-10-CM | POA: Insufficient documentation

## 2015-11-26 NOTE — Assessment & Plan Note (Signed)
Abnormal ABI on [facility] Screening; will get arterial dopplers at the hospital

## 2015-11-26 NOTE — Assessment & Plan Note (Signed)
Also noted on the [facility] Screening; encouraged him to cut down, work on weight loss

## 2015-11-26 NOTE — Assessment & Plan Note (Signed)
Talked with patient about upcoming holidays; avoid or at least significantly limit [place]key, gravy, chicken soup, etc

## 2015-11-26 NOTE — Assessment & Plan Note (Signed)
I believe this is postural, related to bending forward, then sitting up and standing up too quickly; will reduce his BP medicine; have him put feet up on stool to tie shoes rather than bend forward at the waist,e.g., give himself a minute after sitting before jumping up and walking; hydration; call if not improving

## 2015-12-08 ENCOUNTER — Other Ambulatory Visit: Payer: Self-pay | Admitting: Family Medicine

## 2015-12-08 DIAGNOSIS — R6889 Other general symptoms and signs: Secondary | ICD-10-CM

## 2015-12-22 ENCOUNTER — Ambulatory Visit: Payer: Medicare Other

## 2015-12-22 DIAGNOSIS — I739 Peripheral vascular disease, unspecified: Secondary | ICD-10-CM | POA: Diagnosis not present

## 2015-12-22 DIAGNOSIS — R6889 Other general symptoms and signs: Secondary | ICD-10-CM

## 2015-12-22 DIAGNOSIS — R202 Paresthesia of skin: Secondary | ICD-10-CM | POA: Diagnosis not present

## 2015-12-26 ENCOUNTER — Encounter: Payer: Self-pay | Admitting: Family Medicine

## 2015-12-26 ENCOUNTER — Other Ambulatory Visit: Payer: Self-pay | Admitting: Family Medicine

## 2015-12-26 DIAGNOSIS — I739 Peripheral vascular disease, unspecified: Secondary | ICD-10-CM

## 2015-12-26 DIAGNOSIS — R6889 Other general symptoms and signs: Secondary | ICD-10-CM

## 2015-12-26 HISTORY — DX: Peripheral vascular disease, unspecified: I73.9

## 2015-12-26 NOTE — Progress Notes (Signed)
Refer to vasc 

## 2015-12-26 NOTE — Assessment & Plan Note (Signed)
Refer to vasc 

## 2015-12-26 NOTE — Assessment & Plan Note (Signed)
Confirmatory testing shows elevated ABI suggestive of calcified arteries; refer to vasc

## 2015-12-29 ENCOUNTER — Other Ambulatory Visit: Payer: Self-pay

## 2015-12-29 NOTE — Telephone Encounter (Signed)
Ins. Requesting 90 day 

## 2015-12-30 ENCOUNTER — Other Ambulatory Visit (INDEPENDENT_AMBULATORY_CARE_PROVIDER_SITE_OTHER): Payer: Self-pay | Admitting: Vascular Surgery

## 2015-12-30 DIAGNOSIS — I739 Peripheral vascular disease, unspecified: Secondary | ICD-10-CM

## 2015-12-30 MED ORDER — RANITIDINE HCL 300 MG PO TABS: 300.0000 mg | ORAL_TABLET | Freq: Every day | ORAL | 0 refills | Status: DC

## 2015-12-30 MED ORDER — PANTOPRAZOLE SODIUM 20 MG PO TBEC: 20.0000 mg | DELAYED_RELEASE_TABLET | ORAL | 0 refills | Status: DC

## 2015-12-30 NOTE — Telephone Encounter (Signed)
Patient notified

## 2015-12-30 NOTE — Telephone Encounter (Signed)
Let's have him decrease the pantoprazole to just 20 mg every other day for one month, then try stopping it ADD ranitidine 300 mg at bedtime now (okay to take both a PPI and H2 blocker) Avoid triggers Don't eat for 2-3 hours before going to bed Elevate HOB slightly to keep acid in the stomach overnight Call right away with worsening symptoms Avoid NSAIDs

## 2015-12-31 ENCOUNTER — Encounter (INDEPENDENT_AMBULATORY_CARE_PROVIDER_SITE_OTHER): Payer: Self-pay | Admitting: Vascular Surgery

## 2015-12-31 ENCOUNTER — Ambulatory Visit (INDEPENDENT_AMBULATORY_CARE_PROVIDER_SITE_OTHER): Payer: Medicare Other | Admitting: Vascular Surgery

## 2015-12-31 ENCOUNTER — Ambulatory Visit (INDEPENDENT_AMBULATORY_CARE_PROVIDER_SITE_OTHER): Payer: Medicare Other

## 2015-12-31 ENCOUNTER — Encounter (INDEPENDENT_AMBULATORY_CARE_PROVIDER_SITE_OTHER): Payer: Self-pay

## 2015-12-31 VITALS — BP 157/93 | HR 67 | Resp 16 | Ht 68.5 in | Wt 222.0 lb

## 2015-12-31 DIAGNOSIS — E119 Type 2 diabetes mellitus without complications: Secondary | ICD-10-CM

## 2015-12-31 DIAGNOSIS — I10 Essential (primary) hypertension: Secondary | ICD-10-CM | POA: Diagnosis not present

## 2015-12-31 DIAGNOSIS — I739 Peripheral vascular disease, unspecified: Secondary | ICD-10-CM

## 2015-12-31 NOTE — Progress Notes (Signed)
Subjective:    Patient ID: Drew [name], male    DOB: [birth year], 75 y.o.   MRN: [id number] Chief Complaint  Patient presents with  . New Patient (Initial Visit)   Presents as a new patient referred by Dr. Sherie DonLada for "abnormal ABI's". Patient underwent a [employer] screening which was notable for possible PAD with abnormal ABI's performed at an outside facility. [facility] showed Right ABI: 1.5  and Left 1.6, Waveforms and great toe pressures are normal bilaterally (no previous for comparison). The patient denies any claudication like symptoms, rest pain or ulcers to his feet / toes.    Review of Systems  Constitutional: Negative.   HENT: Negative.   Eyes: Negative.   Respiratory: Negative.   Cardiovascular: Negative.   Gastrointestinal: Negative.   Endocrine: Negative.   Genitourinary: Negative.   Musculoskeletal: Negative.   Skin: Negative.   Allergic/Immunologic: Negative.   Neurological: Negative.   Hematological: Negative.   Psychiatric/Behavioral: Negative.       Objective:   Physical Exam  Constitutional: He is oriented to person, place, and time. He appears well-developed and well-nourished.  HENT:  Head: Atraumatic.  Eyes: Conjunctivae and EOM are normal. Pupils are equal, round, and reactive to light.  Neck: Normal range of motion.  Cardiovascular: Normal rate, regular rhythm, normal heart sounds and intact distal pulses.   Pulses:      Radial pulses are 2+ on the right side, and 2+ on the left side.       Dorsalis pedis pulses are 1+ on the right side, and 1+ on the left side.       Posterior tibial pulses are 1+ on the right side, and 1+ on the left side.  Pulmonary/Chest: Effort normal and breath sounds normal.  Abdominal: Soft. Bowel sounds are normal.  Musculoskeletal: Normal range of motion. He exhibits no edema.  Neurological: He is alert and oriented to person, place, and time.  Skin: Skin is warm and dry.    Psychiatric: He has a normal mood and affect. His behavior is normal. Judgment and thought content normal.   BP (!) 157/93   Pulse 67   Resp 16   Ht 5' 8.5" (1.74 m)   Wt 222 lb (100.7 kg)   BMI 33.26 kg/m   Past Medical History:  Diagnosis Date  . Chronic kidney disease   . Chronic kidney disease, stage III (moderate) 08/15/2015  . Diabetes mellitus without complication (HCC)   . GERD (gastroesophageal reflux disease)   . H/O: upper GI bleed 2015  . Hypercholesteremia   . Hypertension   . Obesity (BMI 30-39.9) 08/07/2015  . Peripheral vascular disease (HCC) 12/26/2015   Social History   Social History  . Marital status: Married    Spouse name: N/A  . Number of children: N/A  . Years of education: N/A   Occupational History  . Not on file.   Social History Main Topics  . Smoking status: Never Smoker  . Smokeless tobacco: Never Used  . Alcohol use No  . Drug use: No  . Sexual activity: Not on file   Other Topics Concern  . Not on file   Social History Narrative  . No narrative on file   Past Surgical History:  Procedure Laterality Date  . COLONOSCOPY N/A 12/27/2013   Procedure: COLONOSCOPY;  Surgeon: Barrie FolkJohn C Hayes, MD;  [facility];  Service: Endoscopy;  Laterality: N/A;  . ENTEROSCOPY N/A 12/21/2013  Procedure: ENTEROSCOPY;  Surgeon: Vertell NovakJames L Edwards Jr., MD;  [facility];  Service: Endoscopy;  Laterality: N/A;  . ENTEROSCOPY N/A 12/27/2013   Procedure: ENTEROSCOPY;  Surgeon: Barrie FolkJohn C Hayes, MD;  [facility];  Service: Endoscopy;  Laterality: N/A;   Family History  Problem Relation Age of Onset  . Hyperlipidemia Father   . Kidney disease Father   . Congestive Heart Failure Mother    No Known Allergies     Assessment & Plan:  Presents as a new patient referred by Dr. Sherie DonLada for "abnormal ABI's". Patient underwent a [facility] screening which was notable for possible PAD with abnormal ABI's performed at an outside facility. ABI's  per[facility] showed Right ABI: 1.5  and Left 1.6, Waveforms and great toe pressures are normal bilaterally (no previous for comparison). The patient denies any claudication like symptoms, rest pain or ulcers to his feet / toes.   1. Peripheral vascular disease (HCC) - New ABI's falsely elevated due to medial calcification. Waveforms and great toe pressures are normal bilaterally. Asymptomatic with normal physical exam. No intervention indicated at this time. Patient to follow up in 2 year with ABI.  2. Diabetes mellitus type 2, diet-controlled (HCC) - Stable Encouraged good control as its slows the progression of atherosclerotic disease  3. Hypertension goal BP (blood pressure) - Stable Encouraged good control as its slows the progression of atherosclerotic disease  Current Outpatient Prescriptions on File Prior to Visit  Medication Sig Dispense Refill  . allopurinol (ZYLOPRIM) 100 MG tablet Take 2 tablets (200 mg total) by mouth daily. 180 tablet 0  . amLODipine (NORVASC) 5 MG tablet Take 1 tablet (5 mg total) by mouth daily. 90 tablet 3  . Coenzyme Q10 (CO Q 10) 100 MG CAPS Take 100 mg by mouth daily.    . fenofibrate (TRICOR) 145 MG tablet Take 1 tablet (145 mg total) by mouth every evening. 90 tablet 2  . levothyroxine (SYNTHROID, LEVOTHROID) 25 MCG tablet Take 1 tablet (25 mcg total) by mouth every morning. 90 tablet 2  . Melatonin 10 MG TABS Take 1 tablet by mouth.    . Omega-3 Fatty Acids (FISH OIL) 1000 MG CAPS Take 1,000 mg by mouth daily.    . pantoprazole (PROTONIX) 20 MG tablet Take 1 tablet (20 mg total) by mouth every other day. 15 tablet 0  . ranitidine (ZANTAC) 300 MG tablet Take 1 tablet (300 mg total) by mouth at bedtime. 90 tablet 0  . Multiple Vitamin (ONE-A-DAY MENS PO) Take 1 tablet by mouth every morning.     No current facility-administered medications on file prior to visit.     There are no Patient Instructions on file for  this visit. No Follow-up on file.   Nicko Daher A Ivalene Platte, PA-C

## 2016-01-06 ENCOUNTER — Other Ambulatory Visit: Payer: Self-pay

## 2016-01-06 DIAGNOSIS — E034 Atrophy of thyroid (acquired): Secondary | ICD-10-CM

## 2016-01-06 MED ORDER — LEVOTHYROXINE SODIUM 25 MCG PO TABS: 25.0000 ug | ORAL_TABLET | ORAL | 1 refills | Status: DC

## 2016-01-06 NOTE — Telephone Encounter (Signed)
Last TSH reviewed; rx approved Lab Results  Component Value Date   TSH 1.92 08/07/2015

## 2016-01-08 DIAGNOSIS — N183 Chronic kidney disease, stage 3 (moderate): Secondary | ICD-10-CM | POA: Diagnosis not present

## 2016-01-08 DIAGNOSIS — E1129 Type 2 diabetes mellitus with other diabetic kidney complication: Secondary | ICD-10-CM | POA: Diagnosis not present

## 2016-01-08 DIAGNOSIS — I129 Hypertensive chronic kidney disease with stage 1 through stage 4 chronic kidney disease, or unspecified chronic kidney disease: Secondary | ICD-10-CM | POA: Diagnosis not present

## 2016-01-08 DIAGNOSIS — N281 Cyst of kidney, acquired: Secondary | ICD-10-CM | POA: Diagnosis not present

## 2016-01-09 ENCOUNTER — Other Ambulatory Visit: Payer: Self-pay | Admitting: Nephrology

## 2016-01-09 ENCOUNTER — Other Ambulatory Visit (HOSPITAL_COMMUNITY): Payer: Self-pay | Admitting: Nephrology

## 2016-01-09 DIAGNOSIS — Q6102 Congenital multiple renal cysts: Secondary | ICD-10-CM

## 2016-01-22 ENCOUNTER — Ambulatory Visit
Admission: RE | Admit: 2016-01-22 | Discharge: 2016-01-22 | Disposition: A | Discharge status: Home / Self Care | Payer: Medicare Other | Source: Ambulatory Visit | Attending: Nephrology | Admitting: Nephrology

## 2016-01-22 DIAGNOSIS — K7689 Other specified diseases of liver: Secondary | ICD-10-CM | POA: Insufficient documentation

## 2016-01-22 DIAGNOSIS — K449 Diaphragmatic hernia without obstruction or gangrene: Secondary | ICD-10-CM | POA: Insufficient documentation

## 2016-01-22 DIAGNOSIS — N261 Atrophy of kidney (terminal): Secondary | ICD-10-CM | POA: Insufficient documentation

## 2016-01-22 DIAGNOSIS — Q6102 Congenital multiple renal cysts: Secondary | ICD-10-CM

## 2016-01-22 DIAGNOSIS — N281 Cyst of kidney, acquired: Secondary | ICD-10-CM | POA: Diagnosis not present

## 2016-02-04 ENCOUNTER — Other Ambulatory Visit: Payer: Self-pay | Admitting: Family Medicine

## 2016-02-05 ENCOUNTER — Ambulatory Visit: Payer: Medicare Other | Admitting: Family Medicine

## 2016-02-09 ENCOUNTER — Encounter: Payer: Self-pay | Admitting: Family Medicine

## 2016-02-09 ENCOUNTER — Ambulatory Visit (INDEPENDENT_AMBULATORY_CARE_PROVIDER_SITE_OTHER): Payer: Medicare Other | Admitting: Family Medicine

## 2016-02-09 DIAGNOSIS — Z5181 Encounter for therapeutic drug level monitoring: Secondary | ICD-10-CM

## 2016-02-09 DIAGNOSIS — E034 Atrophy of thyroid (acquired): Secondary | ICD-10-CM | POA: Diagnosis not present

## 2016-02-09 DIAGNOSIS — E785 Hyperlipidemia, unspecified: Secondary | ICD-10-CM

## 2016-02-09 DIAGNOSIS — N183 Chronic kidney disease, stage 3 unspecified: Secondary | ICD-10-CM

## 2016-02-09 DIAGNOSIS — E669 Obesity, unspecified: Secondary | ICD-10-CM

## 2016-02-09 DIAGNOSIS — E119 Type 2 diabetes mellitus without complications: Secondary | ICD-10-CM

## 2016-02-09 DIAGNOSIS — M1A0710 Idiopathic chronic gout, right ankle and foot, without tophus (tophi): Secondary | ICD-10-CM | POA: Diagnosis not present

## 2016-02-09 DIAGNOSIS — I10 Essential (primary) hypertension: Secondary | ICD-10-CM

## 2016-02-09 LAB — COMPLETE METABOLIC PANEL WITH GFR
ALBUMIN: 4.6 g/dL (ref 3.6–5.1)
ALK PHOS: 48 U/L (ref 40–115)
ALT: 23 U/L (ref 9–46)
AST: 23 U/L (ref 10–35)
BUN: 18 mg/dL (ref 7–25)
CO2: 21 mmol/L (ref 20–31)
Calcium: 10 mg/dL (ref 8.6–10.3)
Chloride: 105 mmol/L (ref 98–110)
Creat: 1.57 mg/dL — ABNORMAL HIGH (ref 0.70–1.18)
GFR, EST NON AFRICAN AMERICAN: 42 mL/min — AB (ref >=60)
GFR, Est African American: 49 mL/min — ABNORMAL LOW (ref >=60)
GLUCOSE: 134 mg/dL — AB (ref 65–99)
POTASSIUM: 4.8 mmol/L (ref 3.5–5.3)
SODIUM: 140 mmol/L (ref 135–146)
Total Bilirubin: 0.6 mg/dL (ref 0.2–1.2)
Total Protein: 6.7 g/dL (ref 6.1–8.1)

## 2016-02-09 LAB — CBC WITH DIFFERENTIAL/PLATELET
Basophils Absolute: 59 cells/uL (ref 0–200)
Basophils Relative: 1 %
EOS PCT: 1 %
Eosinophils Absolute: 59 cells/uL (ref 15–500)
HCT: 48.2 % (ref 38.5–50.0)
HEMOGLOBIN: 16.1 g/dL (ref 13.2–17.1)
LYMPHS ABS: 1711 {cells}/uL (ref 850–3900)
Lymphocytes Relative: 29 %
MCH: 32.1 pg (ref 27.0–33.0)
MCHC: 33.4 g/dL (ref 32.0–36.0)
MCV: 96.2 fL (ref 80.0–100.0)
MONOS PCT: 7 %
MPV: 10.5 fL (ref 7.5–12.5)
Monocytes Absolute: 413 cells/uL (ref 200–950)
NEUTROS ABS: 3658 {cells}/uL (ref 1500–7800)
NEUTROS PCT: 62 %
PLATELETS: 187 10*3/uL (ref 140–400)
RBC: 5.01 MIL/uL (ref 4.20–5.80)
RDW: 13.9 % (ref 11.0–15.0)
WBC: 5.9 10*3/uL (ref 3.8–10.8)

## 2016-02-09 LAB — LIPID PANEL
CHOLESTEROL: 185 mg/dL (ref <200)
HDL: 16 mg/dL — ABNORMAL LOW (ref >40)
LDL CALC: 131 mg/dL — AB (ref <100)
TRIGLYCERIDES: 188 mg/dL — AB (ref <150)
Total CHOL/HDL Ratio: 11.6 Ratio — ABNORMAL HIGH (ref <5.0)
VLDL: 38 mg/dL — ABNORMAL HIGH (ref <30)

## 2016-02-09 LAB — TSH: TSH: 1.34 m[IU]/L (ref 0.40–4.50)

## 2016-02-09 NOTE — Assessment & Plan Note (Signed)
Goal LDL at least under 100, under 70 would be ideal; limit saturated fats

## 2016-02-09 NOTE — Assessment & Plan Note (Signed)
Continue medicines; well-controlled; try DASH guidelines

## 2016-02-09 NOTE — Assessment & Plan Note (Signed)
Check SGPT 

## 2016-02-09 NOTE — Patient Instructions (Signed)
Please do see your eye doctor regularly, and have your eyes examined every year (or more often per his or her recommendation) Check your feet every night and let me know right away of any sores, infections, numbness, etc. Try to limit sweets, white bread, white rice, white potatoes It is okay with me for you to not check your fingerstick blood sugars (per [employer] Best Practices), unless you are interested and feel it would be helpful for you Try to limit saturated fats in your diet (bologna, hot dogs, barbeque, cheeseburgers, hamburgers, steak, bacon, sausage, cheese, etc.) and get more fresh fruits, vegetables, and whole grains Keep up the good work on weight loss

## 2016-02-09 NOTE — Assessment & Plan Note (Signed)
Check A1c today; foot exam by MD; patient to check feet nightly; limit or avoid sugary drinks

## 2016-02-09 NOTE — Assessment & Plan Note (Signed)
Praised patient for weight loss 

## 2016-02-09 NOTE — Assessment & Plan Note (Signed)
Avoid NSAIDs; seeing nephrologist

## 2016-02-09 NOTE — Assessment & Plan Note (Signed)
Check TSH 

## 2016-02-09 NOTE — Progress Notes (Signed)
BP 122/78   Pulse 83   Temp 98.3 F (36.8 C) (Oral)   Resp 14   Wt 217 lb (98.4 kg)   SpO2 96%   BMI 32.51 kg/m    Subjective:    Patient ID: Drew [name], male    DOB: 1940/03/24, 75 y.o.   MRN: [id number]  HPI: Drew [name] is a 75 y.o. male  Chief Complaint  Patient presents with  . Follow-up    Here for follow-up He had an MRI since last visit on his kidney; seeing kidney doctor; drinks water; avoiding NSAIDs; he knows tylenol is okay  Hypothyroidism; going on for not too long, last TSH in June; weight and BMs regular  High cholesterol; eating some foods with saturated fats  Uric acid; last done in June; no recent gout attacks  Diabetes; burning on the soles of the feet after walking long distances; last A1c 6.8  [facility] 02/09/2016 11/24/2015 08/07/2015 03/11/2015 11/06/2014  Decreased Interest 0 0 0 0 0  Down, Depressed, Hopeless 1 0 0 0 0  PHQ - 2 Score 1 0 0 0 0   Relevant past medical, surgical, family and social history reviewed Past Medical History:  Diagnosis Date  . Chronic kidney disease   . Chronic kidney disease, stage III (moderate) 08/15/2015  . Diabetes mellitus without complication (HCC)   . GERD (gastroesophageal reflux disease)   . H/O: upper GI bleed 2015  . Hypercholesteremia   . Hypertension   . Obesity (BMI 30-39.9) 08/07/2015  . Peripheral vascular disease (HCC) 12/26/2015   Past Surgical History:  Procedure Laterality Date  . COLONOSCOPY N/A 12/27/2013   Procedure: COLONOSCOPY;  Surgeon: Barrie FolkJohn C Hayes, MD;  [facility];  Service: Endoscopy;  Laterality: N/A;  . ENTEROSCOPY N/A 12/21/2013   Procedure: ENTEROSCOPY;  Surgeon: Vertell NovakJames L Edwards Jr., MD;  [facility];  Service: Endoscopy;  Laterality: N/A;  . ENTEROSCOPY N/A 12/27/2013   Procedure: ENTEROSCOPY;  Surgeon: Barrie FolkJohn C Hayes, MD;  [facility];  Service: Endoscopy;  Laterality: N/A;   Family History  Problem Relation Age of Onset  .  Hyperlipidemia Father   . Kidney disease Father   . Congestive Heart Failure Mother    Social History  Substance Use Topics  . Smoking status: Never Smoker  . Smokeless tobacco: Never Used  . Alcohol use No   Interim medical history since last visit reviewed. Allergies and medications reviewed  Review of Systems Per HPI unless specifically indicated above     Objective:    BP 122/78   Pulse 83   Temp 98.3 F (36.8 C) (Oral)   Resp 14   Wt 217 lb (98.4 kg)   SpO2 96%   BMI 32.51 kg/m   Wt Readings from Last 3 Encounters:  02/09/16 217 lb (98.4 kg)  12/31/15 222 lb (100.7 kg)  11/24/15 221 lb 8 oz (100.5 kg)    Physical Exam  Constitutional: He appears well-developed and well-nourished. No distress.  HENT:  Head: Normocephalic and atraumatic.  Eyes: EOM are normal. No scleral icterus.  Neck: No JVD present. Carotid bruit is not present. No thyromegaly present.  Cardiovascular: Normal rate and regular rhythm.   Pulses:      Dorsalis pedis pulses are 1+ on the right side, and 1+ on the left side.  Feet are not cool or mottled  Pulmonary/Chest: Effort normal and breath sounds normal.  Musculoskeletal: He exhibits no edema.  Neurological: He is  alert.  Skin: Skin is warm and dry. No pallor.  Psychiatric: He has a normal mood and affect. His behavior is normal. Judgment and thought content normal. His mood appears not anxious. He does not exhibit a depressed mood.   Diabetic Foot Form - Detailed   Diabetic Foot Exam - detailed Diabetic Foot exam was performed with the following findings:  Yes 02/17/2016  9:29 AM  Visual Foot Exam completed.:  Yes  Are the toenails ingrown?:  No Normal Range of Motion:  Yes Pulse Foot Exam completed.:  Yes  Right Dorsalis Pedis:  Present Left Dorsalis Pedis:  Present  Sensory Foot Exam Completed.:  Yes Swelling:  No Semmes-Weinstein Monofilament Test R Site 1-Great Toe:  Pos L Site 1-Great Toe:  Pos  R Site 4:  Pos L Site 4:  Pos    R Site 5:  Pos L Site 5:  Pos          Assessment & Plan:   Problem List Items Addressed This Visit      Cardiovascular and Mediastinum   Hypertension goal BP (blood pressure) < 140/90 (Chronic)    Continue medicines; well-controlled; try DASH guidelines      Relevant Medications   enalapril (VASOTEC) 5 MG tablet     Endocrine   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)    Check TSH      Relevant Orders   TSH (Completed)   Diabetes mellitus type 2, diet-controlled (HCC) (Chronic)    Check A1c today; foot exam by MD; patient to check feet nightly; limit or avoid sugary drinks      Relevant Medications   enalapril (VASOTEC) 5 MG tablet   Other Relevant Orders   Hemoglobin A1c (Completed)     Musculoskeletal and Integument   Chronic gout of ankle    Last uric acid normal        Genitourinary   Chronic kidney disease, stage III (moderate) (Chronic)    Avoid NSAIDs; seeing nephrologist      Relevant Orders   CBC with Differential/Platelet (Completed)     Other   Obesity (BMI 30-39.9) (Chronic)    Praised patient for weight loss      Medication monitoring encounter    Check SGPT      Relevant Orders   COMPLETE METABOLIC PANEL WITH GFR (Completed)   Hyperlipidemia LDL goal <100    Goal LDL at least under 100, under 70 would be ideal; limit saturated fats      Relevant Medications   enalapril (VASOTEC) 5 MG tablet   Other Relevant Orders   Lipid panel (Completed)      Follow up plan: Return in about 6 months (around 08/09/2016) for follow-up.  An after-visit summary was printed and given to the patient at check-out.  Please see the patient instructions which may contain other information and recommendations beyond what is mentioned above in the assessment and plan.  Meds ordered this encounter  Medications  . enalapril (VASOTEC) 5 MG tablet    Orders Placed This Encounter  Procedures  . CBC with Differential/Platelet  .  COMPLETE METABOLIC PANEL WITH GFR  . Hemoglobin A1c  . TSH  . Lipid panel

## 2016-02-09 NOTE — Assessment & Plan Note (Signed)
Last uric acid normal  

## 2016-02-10 ENCOUNTER — Telehealth: Payer: Self-pay | Admitting: Family Medicine

## 2016-02-10 ENCOUNTER — Other Ambulatory Visit: Payer: Self-pay | Admitting: Family Medicine

## 2016-02-10 ENCOUNTER — Other Ambulatory Visit: Payer: Self-pay

## 2016-02-10 DIAGNOSIS — I739 Peripheral vascular disease, unspecified: Secondary | ICD-10-CM

## 2016-02-10 DIAGNOSIS — E785 Hyperlipidemia, unspecified: Secondary | ICD-10-CM

## 2016-02-10 DIAGNOSIS — Z5181 Encounter for therapeutic drug level monitoring: Secondary | ICD-10-CM

## 2016-02-10 DIAGNOSIS — Z7189 Other specified counseling: Secondary | ICD-10-CM | POA: Insufficient documentation

## 2016-02-10 DIAGNOSIS — E119 Type 2 diabetes mellitus without complications: Secondary | ICD-10-CM

## 2016-02-10 LAB — HEMOGLOBIN A1C
Hgb A1c MFr Bld: 6.4 % — ABNORMAL HIGH (ref <5.7)
Mean Plasma Glucose: 137 mg/dL

## 2016-02-10 MED ORDER — PANTOPRAZOLE SODIUM 20 MG PO TBEC: 20.0000 mg | DELAYED_RELEASE_TABLET | ORAL | 1 refills | Status: DC

## 2016-02-10 MED ORDER — ATORVASTATIN CALCIUM 40 MG PO TABS: 40.0000 mg | ORAL_TABLET | Freq: Every day | ORAL | 1 refills | Status: DC

## 2016-02-10 NOTE — Assessment & Plan Note (Signed)
Add statin. 

## 2016-02-10 NOTE — Assessment & Plan Note (Signed)
Refer to cardiologist. °

## 2016-02-10 NOTE — Assessment & Plan Note (Signed)
Refer to cardiologist with unfavorable lipid panel, very low HDL, diabetes, etc

## 2016-02-10 NOTE — Assessment & Plan Note (Signed)
Change to statin; check lipids in 6 weeks

## 2016-02-10 NOTE — Assessment & Plan Note (Signed)
Check sgpt in 6 weeks 

## 2016-02-10 NOTE — Telephone Encounter (Signed)
I spoke with patient about labs; healthier eating really stressed; change medicine; labs 03/24/16 He agrees with cardiologist referral Serious GI bleed in 2015; will have cardiologist decide about aspirin

## 2016-02-10 NOTE — Assessment & Plan Note (Signed)
Change to statin

## 2016-02-10 NOTE — Progress Notes (Signed)
Stop fenofibrate; I reviewed old records, could not find very high TG; stop fenofibrate and start statin Watch diet Check lipids and sgpt in 6 weeks

## 2016-02-12 ENCOUNTER — Other Ambulatory Visit: Payer: Self-pay

## 2016-02-12 DIAGNOSIS — M1A0610 Idiopathic chronic gout, right knee, without tophus (tophi): Secondary | ICD-10-CM

## 2016-02-12 MED ORDER — ALLOPURINOL 100 MG PO TABS: 200.0000 mg | ORAL_TABLET | Freq: Every day | ORAL | 1 refills | Status: DC

## 2016-02-12 NOTE — Telephone Encounter (Signed)
Last uric acid and cr reviewed; Rx approved

## 2016-02-18 DIAGNOSIS — N281 Cyst of kidney, acquired: Secondary | ICD-10-CM | POA: Diagnosis not present

## 2016-02-18 DIAGNOSIS — I129 Hypertensive chronic kidney disease with stage 1 through stage 4 chronic kidney disease, or unspecified chronic kidney disease: Secondary | ICD-10-CM | POA: Diagnosis not present

## 2016-02-18 DIAGNOSIS — N183 Chronic kidney disease, stage 3 (moderate): Secondary | ICD-10-CM | POA: Diagnosis not present

## 2016-02-18 DIAGNOSIS — R809 Proteinuria, unspecified: Secondary | ICD-10-CM | POA: Diagnosis not present

## 2016-02-26 ENCOUNTER — Other Ambulatory Visit: Payer: Self-pay

## 2016-02-26 DIAGNOSIS — M1A0610 Idiopathic chronic gout, right knee, without tophus (tophi): Secondary | ICD-10-CM

## 2016-02-26 DIAGNOSIS — E034 Atrophy of thyroid (acquired): Secondary | ICD-10-CM

## 2016-02-26 MED ORDER — ALLOPURINOL 100 MG PO TABS: 200.0000 mg | ORAL_TABLET | Freq: Every day | ORAL | 1 refills | Status: DC

## 2016-02-26 MED ORDER — ATORVASTATIN CALCIUM 40 MG PO TABS: 40.0000 mg | ORAL_TABLET | Freq: Every day | ORAL | 0 refills | Status: DC

## 2016-02-26 MED ORDER — LEVOTHYROXINE SODIUM 25 MCG PO TABS: 25.0000 ug | ORAL_TABLET | ORAL | 1 refills | Status: DC

## 2016-02-26 MED ORDER — AMLODIPINE BESYLATE 5 MG PO TABS: 5.0000 mg | ORAL_TABLET | Freq: Every day | ORAL | 1 refills | Status: DC

## 2016-02-26 NOTE — Telephone Encounter (Signed)
90 day supply

## 2016-02-26 NOTE — Telephone Encounter (Signed)
Our med list has him on 5 mg amlodipine once a day Kidney doctor's med list shows half of that dose (2.5 mg daily) Please find out if they truly want him on 2.5 mg daily, and if so, he'll want to follow their instructions If their list just needs updating, that's fine Just want to be careful Thank you

## 2016-02-27 ENCOUNTER — Encounter: Payer: Self-pay | Admitting: Internal Medicine

## 2016-02-27 ENCOUNTER — Other Ambulatory Visit: Payer: Self-pay

## 2016-02-27 ENCOUNTER — Ambulatory Visit (INDEPENDENT_AMBULATORY_CARE_PROVIDER_SITE_OTHER): Payer: Medicare Other | Admitting: Internal Medicine

## 2016-02-27 ENCOUNTER — Other Ambulatory Visit: Payer: Self-pay | Admitting: Family Medicine

## 2016-02-27 VITALS — BP 136/80 | HR 74 | Ht 69.0 in | Wt 223.5 lb

## 2016-02-27 DIAGNOSIS — R9431 Abnormal electrocardiogram [ECG] [EKG]: Secondary | ICD-10-CM

## 2016-02-27 DIAGNOSIS — R600 Localized edema: Secondary | ICD-10-CM

## 2016-02-27 DIAGNOSIS — M79605 Pain in left leg: Secondary | ICD-10-CM

## 2016-02-27 DIAGNOSIS — R6889 Other general symptoms and signs: Secondary | ICD-10-CM | POA: Diagnosis not present

## 2016-02-27 DIAGNOSIS — R5383 Other fatigue: Secondary | ICD-10-CM | POA: Diagnosis not present

## 2016-02-27 DIAGNOSIS — M79604 Pain in right leg: Secondary | ICD-10-CM | POA: Diagnosis not present

## 2016-02-27 MED ORDER — ASPIRIN EC 81 MG PO TBEC: 81.0000 mg | DELAYED_RELEASE_TABLET | Freq: Every day | ORAL | 3 refills | Status: DC

## 2016-02-27 MED ORDER — AMLODIPINE BESYLATE 2.5 MG PO TABS: 2.5000 mg | ORAL_TABLET | Freq: Every day | ORAL | 3 refills | Status: DC

## 2016-02-27 NOTE — Patient Instructions (Addendum)
Medication Instructions:  Your physician has recommended you make the following change in your medication:  1- START Aspirin 81 mg by mouth once a day.   Labwork: - None ordered.   Testing/Procedures: Your physician has requested that you have an echocardiogram. Echocardiography is a painless test that uses sound waves to create images of your heart. It provides your doctor with information about the size and shape of your heart and how well your heart's chambers and valves are working. This procedure takes approximately one hour. There are no restrictions for this procedure.    Follow-Up: Your physician wants you to follow-up in: 3 MONTHS WITH DR END.  You will receive a reminder letter in the mail two months in advance. If you don't receive a letter, please call our office to schedule the follow-up appointment.   If you need a refill on your cardiac medications before your next appointment, please call your pharmacy.   Echocardiogram An echocardiogram, or echocardiography, uses sound waves (ultrasound) to produce an image of your heart. The echocardiogram is simple, painless, obtained within a short period of time, and offers valuable information to your health care provider. The images from an echocardiogram can provide information such as:  Evidence of coronary artery disease (CAD).  Heart size.  Heart muscle function.  Heart valve function.  Aneurysm detection.  Evidence of a past heart attack.  Fluid buildup around the heart.  Heart muscle thickening.  Assess heart valve function. Tell a health care provider about:  Any allergies you have.  All medicines you are taking, including vitamins, herbs, eye drops, creams, and over-the-counter medicines.  Any problems you or family members have had with anesthetic medicines.  Any blood disorders you have.  Any surgeries you have had.  Any medical conditions you have.  Whether you are pregnant or may be  pregnant. What happens before the procedure? No special preparation is needed. Eat and drink normally. What happens during the procedure?  In order to produce an image of your heart, gel will be applied to your chest and a wand-like tool (transducer) will be moved over your chest. The gel will help transmit the sound waves from the transducer. The sound waves will harmlessly bounce off your heart to allow the heart images to be captured in real-time motion. These images will then be recorded.  You may need an IV to receive a medicine that improves the quality of the pictures. What happens after the procedure? You may return to your normal schedule including diet, activities, and medicines, unless your health care provider tells you otherwise. This information is not intended to replace advice given to you by your health care provider. Make sure you discuss any questions you have with your health care provider. Document Released: 02/06/2000 Document Revised: 09/27/2015 Document Reviewed: 10/16/2012 Elsevier Interactive Patient Education  2017 [employer]

## 2016-02-27 NOTE — Telephone Encounter (Signed)
Currently on 2.5 and has been updated in chart

## 2016-02-27 NOTE — Progress Notes (Signed)
Renal doctor wants 2.5 mg amlo and on 10 mg enalapril, they are prescribing that one

## 2016-02-27 NOTE — Progress Notes (Signed)
New Outpatient Visit Date: 02/27/2016  Referring Provider: Kerman Passey, MD [address] [place], [place] [address]  Chief Complaint: Abnormal vascular studies  HPI:  Mr. Leighty is a 76 y.o. year-old male with history of HTN, DM, HLD, CKD stage 3, GERD, and obesity, who has been referred by Dr. Sherie Don for evaluation and management of abnormal ABIs. The patient has been feeling relatively well and does not have any specific complaints today. Upon further questioning, he notes one episode of bilateral anterior calf pain when walking about half a mile. This only occurred on one occasion and has not happened since. The pain was sharp and ceased almost immediately upon stopping. He has not had any sores on his lower extremities. He denies a history of prior PVD or vascular intervention involving either leg. He notes occasional edema involving his feet, and attributes this usually to having a gout flare. He also notes that his feet seem more puffy when he eats considerable amount of salt. He has been trying to watch his diet but is fried foods on a regular basis. ABIs were performed in 11/2015 (details below). These were ordered after abnormal ABIs as part of a "[facility] screening."  Patient has not had any chest pain, shortness of breath, palpitations, lightheadedness, orthopnea, or PND. He has noted some fatigue over the last few months. He is also hungry a lot and has gained some weight over the last few weeks. Should be noted that his weight has been stable over the last year per our records.  --------------------------------------------------------------------------------------------------  Cardiovascular History & Procedures: Cardiovascular Problems:  Leg pain  Risk Factors:  Hypertension, hyperlipidemia, diabetes mellitus, male gender, and age > 27  Cath/PCI:  None  CV Surgery:  None  EP Procedures and Devices:  None  Non-Invasive Evaluation(s):  Lower extremity  arterial vascular study (12/22/15): ABIs right 1.5, left 1.6. TBIs right 1.3, left 1.1.  Recent CV Pertinent Labs: Lab Results  Component Value Date   CHOL 185 02/09/2016   HDL 16 (L) 02/09/2016   LDLCALC 131 (H) 02/09/2016   TRIG 188 (H) 02/09/2016   CHOLHDL 11.6 (H) 02/09/2016   INR 1.32 12/22/2013   K 4.8 02/09/2016   MG 1.9 12/21/2013   BUN 18 02/09/2016   CREATININE 1.57 (H) 02/09/2016    --------------------------------------------------------------------------------------------------  Past Medical History:  Diagnosis Date  . Chronic kidney disease   . Chronic kidney disease, stage III (moderate) 08/15/2015  . Diabetes mellitus without complication (HCC)   . GERD (gastroesophageal reflux disease)   . H/O: upper GI bleed 2015  . Hypercholesteremia   . Hypertension   . Obesity (BMI 30-39.9) 08/07/2015  . Peripheral vascular disease (HCC) 12/26/2015    Past Surgical History:  Procedure Laterality Date  . COLONOSCOPY N/A 12/27/2013   Procedure: COLONOSCOPY;  Surgeon: Barrie Folk, MD;  Location: [facility] ENDOSCOPY;  Service: Endoscopy;  Laterality: N/A;  . ENTEROSCOPY N/A 12/21/2013   Procedure: ENTEROSCOPY;  Surgeon: Vertell Novak., MD;  Location: [facility] ENDOSCOPY;  Service: Endoscopy;  Laterality: N/A;  . ENTEROSCOPY N/A 12/27/2013   Procedure: ENTEROSCOPY;  Surgeon: Barrie Folk, MD;  Location: [facility] ENDOSCOPY;  Service: Endoscopy;  Laterality: N/A;    Outpatient Encounter Prescriptions as of 02/27/2016  Medication Sig  . allopurinol (ZYLOPRIM) 100 MG tablet Take 2 tablets (200 mg total) by mouth daily.  Marland Kitchen atorvastatin (LIPITOR) 40 MG tablet Take 1 tablet (40 mg total) by mouth at bedtime.  . Coenzyme Q10 (CO  Q 10) 100 MG CAPS Take 100 mg by mouth daily.  Marland Kitchen. levothyroxine (SYNTHROID, LEVOTHROID) 25 MCG tablet Take 1 tablet (25 mcg total) by mouth every morning.  . Melatonin 10 MG TABS Take 1 tablet by mouth.  . Omega-3 Fatty Acids (FISH OIL) 1000 MG CAPS Take 1,000 mg by mouth  daily.  . ranitidine (ZANTAC) 300 MG tablet Take 1 tablet (300 mg total) by mouth at bedtime.  . [DISCONTINUED] amLODipine (NORVASC) 5 MG tablet Take 1 tablet (5 mg total) by mouth daily. (or as otherwise directed by your kidney doctor)  . [DISCONTINUED] enalapril (VASOTEC) 5 MG tablet   . [DISCONTINUED] pantoprazole (PROTONIX) 20 MG tablet Take 1 tablet (20 mg total) by mouth every other day. (Patient not taking: Reported on 02/27/2016)   No facility-administered encounter medications on file as of 02/27/2016.     Allergies: Patient has no known allergies.  Social History   Social History  . Marital status: Married    Spouse name: N/A  . Number of children: N/A  . Years of education: N/A   Occupational History  . Not on file.   Social History Main Topics  . Smoking status: Never Smoker  . Smokeless tobacco: Never Used  . Alcohol use No  . Drug use: No  . Sexual activity: Not on file   Other Topics Concern  . Not on file   Social History Narrative  . No narrative on file    Family History  Problem Relation Age of Onset  . Hyperlipidemia Father   . Kidney disease Father   . Heart disease Father   . Congestive Heart Failure Mother     Review of Systems: A 12-system review of systems was performed and was negative except as noted in the HPI.  --------------------------------------------------------------------------------------------------  Physical Exam: BP 136/80 (BP Location: Right Arm, Patient Position: Sitting, Cuff Size: Normal)   Pulse 74   Ht 5\' 9"  (1.753 m)   Wt 101.4 kg (223 lb 8 oz)   BMI 33.01 kg/m   General:  Obese man, seated comfortably in the exam room. He is accompanied by his wife. HEENT: No conjunctival pallor or scleral icterus.  Moist mucous membranes.  OP clear. Neck: Supple without lymphadenopathy, thyromegaly, JVD, or HJR.  No carotid bruit. Lungs: Normal work of breathing.  Clear to auscultation bilaterally without wheezes or  crackles. Heart: Regular rate and rhythm without murmurs, rubs, or gallops.  Non-displaced PMI. Abd: Bowel sounds present.  Soft, NT/ND without hepatosplenomegaly Ext: Trace edema involving both feet. No significant pretibial edema.  Radial, PT, and DP pulses are 2+ bilaterally Skin: warm and dry without rash Neuro: CNIII-XII intact.  Strength and fine-touch sensation intact in upper and lower extremities bilaterally. Psych: Normal mood and affect.  EKG:  Normal sinus rhythm with left anterior fascicular block and nonspecific T-wave changes. 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  Lab Results  Component Value Date   WBC 5.9 02/09/2016   HGB 16.1 02/09/2016   HCT 48.2 02/09/2016   MCV 96.2 02/09/2016   PLT 187 02/09/2016    Lab Results  Component Value Date   NA 140 02/09/2016   K 4.8 02/09/2016   CL 105 02/09/2016   CO2 21 02/09/2016   BUN 18 02/09/2016   CREATININE 1.57 (H) 02/09/2016   GLUCOSE 134 (H) 02/09/2016   ALT 23 02/09/2016    Lab Results  Component Value Date   CHOL 185 02/09/2016   HDL 16 (L) 02/09/2016   LDLCALC 131 (H) 02/09/2016   TRIG 188 (H) 02/09/2016   CHOLHDL 11.6 (H) 02/09/2016     --------------------------------------------------------------------------------------------------  ASSESSMENT AND PLAN: Leg pain and abnormal ABIs Patient's pain is not consistent with claudication. Though ABIs were abnormal (elevated), TBI's were within the normal range. This suggests calcified and non-compressible runoff vessels without high-grade obstruction. We have discussed lifestyle modifications, including continued walking and dietary changes, to prevent development of significant PVD. No further intervention is planned at this time.  Abnormal EKG, fatigue, and leg swelling EKG today demonstrates left anterior fascicular block and nonspecific T-wave  changes. The patient has not had chest pain or shortness of breath but has had intermittent leg swelling. We have therefore agreed to perform a transthoracic echocardiogram, given his history of hypertension and other cardiovascular risk factors.  Follow-up: Return to clinic in 3 months.  Yvonne Kendall, MD 02/27/2016 3:00 PM

## 2016-03-17 ENCOUNTER — Ambulatory Visit (INDEPENDENT_AMBULATORY_CARE_PROVIDER_SITE_OTHER): Payer: Medicare Other

## 2016-03-17 ENCOUNTER — Other Ambulatory Visit: Payer: Self-pay

## 2016-03-17 DIAGNOSIS — R5383 Other fatigue: Secondary | ICD-10-CM | POA: Diagnosis not present

## 2016-03-17 DIAGNOSIS — R600 Localized edema: Secondary | ICD-10-CM

## 2016-03-29 ENCOUNTER — Other Ambulatory Visit: Payer: Self-pay

## 2016-03-29 MED ORDER — RANITIDINE HCL 300 MG PO TABS: 300.0000 mg | ORAL_TABLET | Freq: Every day | ORAL | 3 refills | Status: DC

## 2016-04-20 ENCOUNTER — Other Ambulatory Visit: Payer: Self-pay | Admitting: Family Medicine

## 2016-04-20 NOTE — Telephone Encounter (Signed)
Patient notified will come in tomorrow

## 2016-04-20 NOTE — Telephone Encounter (Signed)
Please give patient a gentle reminder that he was due for labs at the end of January; please get fasting labs ASAP; if he wants me to send in just a few pills, I can do that, or 90 days to mail order; however, the dose might need to be changed; I'll send in quantity he requests; thank you

## 2016-04-21 ENCOUNTER — Other Ambulatory Visit: Payer: Self-pay

## 2016-04-21 ENCOUNTER — Other Ambulatory Visit: Payer: Self-pay | Admitting: Family Medicine

## 2016-04-21 DIAGNOSIS — I739 Peripheral vascular disease, unspecified: Secondary | ICD-10-CM | POA: Diagnosis not present

## 2016-04-21 DIAGNOSIS — E785 Hyperlipidemia, unspecified: Secondary | ICD-10-CM

## 2016-04-21 DIAGNOSIS — E119 Type 2 diabetes mellitus without complications: Secondary | ICD-10-CM

## 2016-04-21 DIAGNOSIS — Z5181 Encounter for therapeutic drug level monitoring: Secondary | ICD-10-CM

## 2016-04-21 LAB — LIPID PANEL
Cholesterol: 105 mg/dL (ref <200)
HDL: 29 mg/dL — AB (ref >40)
LDL Cholesterol: 47 mg/dL (ref <100)
TRIGLYCERIDES: 144 mg/dL (ref <150)
Total CHOL/HDL Ratio: 3.6 Ratio (ref <5.0)
VLDL: 29 mg/dL (ref <30)

## 2016-04-21 LAB — ALT: ALT: 36 U/L (ref 9–46)

## 2016-04-21 MED ORDER — ATORVASTATIN CALCIUM 40 MG PO TABS: 40.0000 mg | ORAL_TABLET | Freq: Every day | ORAL | 3 refills | Status: DC

## 2016-05-03 DIAGNOSIS — I129 Hypertensive chronic kidney disease with stage 1 through stage 4 chronic kidney disease, or unspecified chronic kidney disease: Secondary | ICD-10-CM | POA: Diagnosis not present

## 2016-05-03 DIAGNOSIS — N281 Cyst of kidney, acquired: Secondary | ICD-10-CM | POA: Diagnosis not present

## 2016-05-03 DIAGNOSIS — R601 Generalized edema: Secondary | ICD-10-CM | POA: Diagnosis not present

## 2016-05-03 DIAGNOSIS — N183 Chronic kidney disease, stage 3 (moderate): Secondary | ICD-10-CM | POA: Diagnosis not present

## 2016-05-10 ENCOUNTER — Telehealth: Payer: Self-pay

## 2016-05-10 NOTE — Telephone Encounter (Signed)
Patient called states kidney doctor put him on new med HCTZ 12.5 and he read side effects that it could cause gout.  He gets gout real bad, is this a common side effect do you think he should take.  Dr. Jovita GammaGave him due to swelling in legs and feet

## 2016-05-10 NOTE — Telephone Encounter (Signed)
I'm glad that he read through the potential side effects; please encourage him to contact his kidney doctor and discuss this with him; he may not be aware that he has gout or thinks the risks outweight the benefits; thank you

## 2016-05-10 NOTE — Telephone Encounter (Signed)
Patient notified to contact kidney doctor

## 2016-06-02 ENCOUNTER — Encounter: Payer: Self-pay | Admitting: Internal Medicine

## 2016-06-02 ENCOUNTER — Ambulatory Visit (INDEPENDENT_AMBULATORY_CARE_PROVIDER_SITE_OTHER): Payer: Medicare Other | Admitting: Physician Assistant

## 2016-06-02 VITALS — BP 140/80 | HR 67 | Ht 68.0 in | Wt 224.0 lb

## 2016-06-02 DIAGNOSIS — R079 Chest pain, unspecified: Secondary | ICD-10-CM

## 2016-06-02 DIAGNOSIS — R9431 Abnormal electrocardiogram [ECG] [EKG]: Secondary | ICD-10-CM

## 2016-06-02 DIAGNOSIS — E782 Mixed hyperlipidemia: Secondary | ICD-10-CM

## 2016-06-02 DIAGNOSIS — I10 Essential (primary) hypertension: Secondary | ICD-10-CM

## 2016-06-02 DIAGNOSIS — R0602 Shortness of breath: Secondary | ICD-10-CM

## 2016-06-02 MED ORDER — AMLODIPINE BESYLATE 5 MG PO TABS: 5.0000 mg | ORAL_TABLET | Freq: Every day | ORAL | 3 refills | Status: DC

## 2016-06-02 NOTE — Patient Instructions (Addendum)
Medication Instructions:  Your physician has recommended you make the following change in your medication:  1- INCREASE Amlodipine to 5 mg by mouth once a day.   Labwork: none  Testing/Procedures: Your physician has requested that you have a lexiscan myoview. For further information please visit [email or web address] Please follow instruction sheet, as given.   [facility] LEXISCAN MYOVIEW  Your caregiver has ordered a Stress Test with nuclear imaging. The purpose of this test is to evaluate the blood supply to your heart muscle. This procedure is referred to as a "Non-Invasive Stress Test." This is because other than having an IV started in your vein, nothing is inserted or "invades" your body. Cardiac stress tests are done to find areas of poor blood flow to the heart by determining the extent of coronary artery disease (CAD). Some patients exercise on a treadmill, which naturally increases the blood flow to your heart, while others who are  unable to walk on a treadmill due to physical limitations have a pharmacologic/chemical stress agent called Lexiscan . This medicine will mimic walking on a treadmill by temporarily increasing your coronary blood flow.   Please note: these test may take anywhere between 2-4 hours to complete  PLEASE REPORT TO [facility] MEDICAL MALL ENTRANCE  THE VOLUNTEERS AT THE FIRST DESK WILL DIRECT YOU WHERE TO GO  Date of Procedure:____04/18/18____________  Arrival Time for Procedure:_______07:15 am__________  Instructions regarding medication:   - You may take your normal morning medications with a small sip of water the morning of the procedure.   PLEASE NOTIFY THE OFFICE AT LEAST 24 HOURS IN ADVANCE IF YOU ARE UNABLE TO KEEP YOUR APPOINTMENT.  [phone] AND  PLEASE NOTIFY NUCLEAR MEDICINE AT [facility] AT LEAST 24 HOURS IN ADVANCE IF YOU ARE UNABLE TO KEEP YOUR APPOINTMENT. [phone]  How to prepare for your Myoview test:  1. Do not eat or drink after  midnight 2. No caffeine for 24 hours prior to test 3. No smoking 24 hours prior to test. 4. Your medication may be taken with water.  If your doctor stopped a medication because of this test, do not take that medication. 5. Ladies, please do not wear dresses.  Skirts or pants are appropriate. Please wear a short sleeve shirt. 6. No perfume, cologne or lotion. 7. Wear comfortable walking shoes. No heels!       Follow-Up: Your physician recommends that you schedule a follow-up appointment in: 1 MONTH WITH DR END.  If you need a refill on your cardiac medications before your next appointment, please call your pharmacy.

## 2016-06-02 NOTE — Progress Notes (Signed)
  ROS  Physical Exam   Note entered in error during transfer of providers.

## 2016-06-02 NOTE — Progress Notes (Signed)
Cardiology Office Note Date:  06/02/2016  Patient ID:  Drew [name], Drew [name] [birth year], MRN [id number] PCP:  Baruch Gouty, MD  Cardiologist:  Dr. Okey Dupre, MD    Chief Complaint: SOB/chest pain  History of Present Illness: Drew [name] is a 76 y.o. male with history of HTN, DM, HLD, CKD stage 3, GERD, and obesity who established with Dr. Okey Dupre on 02/27/2016 for evaluation of abnormal ABIs who presented to clinic today for routine follow up of abnormal EKG and prior echo in 02/2016. He noted one episode of chest pain and SOB while chopping wood the month prior.   Patient previously had ABIs performed in the setting of one episode of bilateral anterior calf pain when walking about half a mile. This had not happened since when he was initially seen by cardiology and has not since then either. Prior ABIs in 11/2015 showed ABIs right 1.5, left 1.6. TBIs right 1.3, left 1.1. His symptoms were not felt to be consistent with claudication. His ABIs were abnormal, though his TBIs were within normal range. This suggested calcified and non-compressible runoff vessels without high-grade obstruction. Lifestyle modifications were advised with increased activity. At that time he was noted to have a 12-lead EKG that demonstrated left anterior fascicular block and nonspecific st/t changes. Because of this he underwent echo in late January 2018 that showed EF 55-60%, GR1DD, aortic valve moderately thickened with moderately calcified leaflets without stenosis, mild AI, mildly thickened mitral valve leaflets, RV size and systolic function was normal.  Patient noted in early March 2018 he felt like chopping up some wood for his wood stove. Upon starting this activity he found the wood was quite hard and he was unable to chop the wood with an axe. He had to go get an extra "orange splitter" that he hammered into the wood to split. Upon splitting his 2nd piece of wood he was significantly SOB and had some left-sided chest pressure  that did not radiate. Other than the SOB he denied any other associated symptoms. SOB and chest pressure lasted approximately 5 minutes before self resolving. Since that episode he has not had any further chest pain. He regularly walks 0.5 to 1 mile outdoors with his wife without symptoms. ON 4/10 he mowed have of his field with a self-propelled push mower without symptoms. No palpitations, lightheadedness, orthopnea, PND, presyncope, or syncope. He is symptom-free today.     Past Medical History:  Diagnosis Date  . Chronic kidney disease, stage III (moderate) 08/15/2015  . Diabetes mellitus with complication (HCC)   . GERD (gastroesophageal reflux disease)   . H/O: upper GI bleed 2015  . Hypercholesteremia   . Hypertension   . Obesity (BMI 30-39.9) 08/07/2015  . Peripheral vascular disease (HCC) 12/26/2015    Past Surgical History:  Procedure Laterality Date  . COLONOSCOPY N/A 12/27/2013   Procedure: COLONOSCOPY;  Surgeon: Barrie Folk, MD;  Location: [facility] ENDOSCOPY;  Service: Endoscopy;  Laterality: N/A;  . ENTEROSCOPY N/A 12/21/2013   Procedure: ENTEROSCOPY;  Surgeon: Vertell Novak., MD;  Location: [facility] ENDOSCOPY;  Service: Endoscopy;  Laterality: N/A;  . ENTEROSCOPY N/A 12/27/2013   Procedure: ENTEROSCOPY;  Surgeon: Barrie Folk, MD;  Location: [facility] ENDOSCOPY;  Service: Endoscopy;  Laterality: N/A;    Current Outpatient Prescriptions  Medication Sig Dispense Refill  . allopurinol (ZYLOPRIM) 100 MG tablet Take 2 tablets (200 mg total) by mouth daily. 180 tablet 1  . aspirin EC 81 MG tablet Take 1 tablet (81  mg total) by mouth daily. 90 tablet 3  . atorvastatin (LIPITOR) 40 MG tablet Take 1 tablet (40 mg total) by mouth at bedtime. 90 tablet 3  . Coenzyme Q10 (CO Q 10) 100 MG CAPS Take 100 mg by mouth daily.    Marland Kitchen levothyroxine (SYNTHROID, LEVOTHROID) 25 MCG tablet Take 1 tablet (25 mcg total) by mouth every morning. 90 tablet 1  . Melatonin 10 MG TABS Take 1 tablet by mouth.    . Omega-3  Fatty Acids (FISH OIL) 1000 MG CAPS Take 1,000 mg by mouth daily.    . ranitidine (ZANTAC) 300 MG tablet Take 1 tablet (300 mg total) by mouth at bedtime. 90 tablet 3  . amLODipine (NORVASC) 5 MG tablet Take 1 tablet (5 mg total) by mouth daily. 90 tablet 3   No current facility-administered medications for this visit.     Allergies:   Patient has no known allergies.   Social History:  The patient  reports that he quit smoking about 28 years ago. His smoking use included Cigarettes and Cigars. He has never used smokeless tobacco. He reports that he does not drink alcohol or use drugs.   Family History:  The patient's family history includes Congestive Heart Failure in his mother; Heart disease in his father; Hyperlipidemia in his father; Kidney disease in his father.  ROS:   Review of Systems  Constitutional: Positive for malaise/fatigue. Negative for chills, diaphoresis, fever and weight loss.  HENT: Negative for congestion.   Eyes: Negative for discharge and redness.  Respiratory: Positive for shortness of breath. Negative for cough, hemoptysis, sputum production and wheezing.   Cardiovascular: Positive for chest pain. Negative for palpitations, orthopnea, claudication, leg swelling and PND.  Gastrointestinal: Negative for abdominal pain, blood in stool, heartburn, melena, nausea and vomiting.  Genitourinary: Negative for hematuria.  Musculoskeletal: Negative for falls and myalgias.  Skin: Negative for rash.  Neurological: Positive for weakness. Negative for dizziness, tingling, tremors, sensory change, speech change, focal weakness and loss of consciousness.  Endo/Heme/Allergies: Does not bruise/bleed easily.  Psychiatric/Behavioral: Negative for substance abuse. The patient is not nervous/anxious.   All other systems reviewed and are negative.    PHYSICAL EXAM:  VS:  BP 140/80 (BP Location: Left Arm, Patient Position: Sitting, Cuff Size: Normal)   Pulse 67   Ht  (1.727 m)    Wt 224 lb (101.6 kg)   BMI 34.06 kg/m  BMI: Body mass index is 34.06 kg/m.  Physical Exam  Constitutional: He is oriented to person, place, and time. He appears well-developed and well-nourished.  HENT:  Head: Normocephalic and atraumatic.  Eyes: Right eye exhibits no discharge. Left eye exhibits no discharge.  Neck: Normal range of motion. No JVD present.  Cardiovascular: Normal rate, regular rhythm, S1 normal, S2 normal and normal heart sounds.  Exam reveals no distant heart sounds, no friction rub, no midsystolic click and no opening snap.   No murmur heard. Pulmonary/Chest: Effort normal and breath sounds normal. No respiratory distress. He has no decreased breath sounds. He has no wheezes. He has no rales. He exhibits no tenderness.  Abdominal: Soft. He exhibits no distension. There is no tenderness.  Musculoskeletal: He exhibits no edema.  Neurological: He is alert and oriented to person, place, and time.  Skin: Skin is warm and dry. No cyanosis. Nails show no clubbing.  Psychiatric: He has a normal mood and affect. His speech is normal and behavior is normal. Judgment and thought content normal.  EKG:  Was ordered and interpreted by me today. Shows NSR, 67 bpm, left axis deviation, left anterior fascicular block, nonspecific st/t changes c/w prior study which was reviewed by me as well   Recent Labs: 02/09/2016: BUN 18; Creat 1.57; Hemoglobin 16.1; Platelets 187; Potassium 4.8; Sodium 140; TSH 1.34 04/21/2016: ALT 36  04/21/2016: Cholesterol 105; HDL 29; LDL Cholesterol 47; Total CHOL/HDL Ratio 3.6; Triglycerides 144; VLDL 29   CrCl cannot be calculated (Patient's most recent lab result is older than the maximum 21 days allowed.).   Wt Readings from Last 3 Encounters:  06/02/16 224 lb (101.6 kg)  02/27/16 223 lb 8 oz (101.4 kg)  02/09/16 217 lb (98.4 kg)     Other studies reviewed: Additional studies/records reviewed today include: summarized above  ASSESSMENT AND  PLAN:  1. Chest pain with moderate risk of cardiac etiology/SOB: One episode while doing fairly exertional work of chopping wood. None since. Has been active though not as active as he was when he had symptoms. Did mow the lawn on 4/10 with a self-propelled mower without symptoms. He does have significant risk factors including abnormal EKG, DM, HTN, HLD, male, age > 31. Currently chest pain-free. Will schedule for Lexiscan Myoview to evaluate for high-risk ischemia. If normal, consider non-cardiac etiology.   2. HTN: BP slightly elevated today. Increase Norvasc to 5 mg daily. Monitor.  3. HLD: Lipitor.   4. DM: Per PCP.  5. Leg pain: Resolved. No further cardiology interventions at this time.   Disposition: F/u with Dr. Okey Dupre, MD in 1 month.    Current medicines are reviewed at length with the patient today.  The patient did not have any concerns regarding medicines.  Elinor Dodge PA-C 06/02/2016 3:04 PM     [facility] [place] [address] [place], [place] [address] [phone]

## 2016-06-08 ENCOUNTER — Telehealth: Payer: Self-pay | Admitting: Internal Medicine

## 2016-06-08 ENCOUNTER — Telehealth: Payer: Self-pay | Admitting: *Deleted

## 2016-06-08 NOTE — Telephone Encounter (Signed)
Reviewed all instructions with patient and he verbalized understanding with no further questions at this time.

## 2016-06-08 NOTE — Telephone Encounter (Signed)
Pt wife calling asking about what medications is patient allowed to take and not allowed to take before his myoview in the morning Please call back

## 2016-06-08 NOTE — Telephone Encounter (Signed)
Called and reminded patient about Lexiscan Myoview for tomorrow 06/08/16, arrival of 07:15 am. Patient verbalized understanding of appt date and time and understanding of preprocedural instructions.

## 2016-06-09 ENCOUNTER — Encounter
Admission: RE | Admit: 2016-06-09 | Discharge: 2016-06-09 | Disposition: A | Discharge status: Home / Self Care | Payer: Medicare Other | Source: Ambulatory Visit | Attending: Physician Assistant | Admitting: Physician Assistant

## 2016-06-09 DIAGNOSIS — R0602 Shortness of breath: Secondary | ICD-10-CM | POA: Diagnosis not present

## 2016-06-09 DIAGNOSIS — R079 Chest pain, unspecified: Secondary | ICD-10-CM | POA: Insufficient documentation

## 2016-06-09 LAB — NM MYOCAR MULTI W/SPECT W/WALL MOTION / EF
CHL CUP MPHR: 144 {beats}/min
CHL CUP NUCLEAR SDS: 1
CHL CUP NUCLEAR SRS: 6
CSEPEW: 1 METS
Exercise duration (min): 0 min
Exercise duration (sec): 0 s
LV sys vol: 32 mL
LVDIAVOL: 67 mL (ref 62–150)
NUC STRESS TID: 1.15
Peak HR: 90 {beats}/min
Percent HR: 62 %
Rest HR: 70 {beats}/min
SSS: 1

## 2016-06-09 MED ORDER — TECHNETIUM TC 99M TETROFOSMIN IV KIT
31.4600 | PACK | Freq: Once | INTRAVENOUS | Status: AC | PRN
  Administered 2016-06-09: 31.46 via INTRAVENOUS

## 2016-06-09 MED ORDER — REGADENOSON 0.4 MG/5ML IV SOLN
0.4000 mg | Freq: Once | INTRAVENOUS | Status: AC
  Administered 2016-06-09: 0.4 mg via INTRAVENOUS

## 2016-06-09 MED ORDER — TECHNETIUM TC 99M TETROFOSMIN IV KIT
13.2900 | PACK | Freq: Once | INTRAVENOUS | Status: AC | PRN
  Administered 2016-06-09: 13.29 via INTRAVENOUS

## 2016-07-02 ENCOUNTER — Other Ambulatory Visit: Payer: Self-pay | Admitting: Family Medicine

## 2016-07-02 DIAGNOSIS — E034 Atrophy of thyroid (acquired): Secondary | ICD-10-CM

## 2016-07-02 NOTE — Telephone Encounter (Signed)
Last thyroid test reviewed, normal. Prescription approved.

## 2016-07-14 ENCOUNTER — Encounter: Payer: Self-pay | Admitting: Internal Medicine

## 2016-07-14 ENCOUNTER — Ambulatory Visit (INDEPENDENT_AMBULATORY_CARE_PROVIDER_SITE_OTHER): Payer: Medicare Other | Admitting: Internal Medicine

## 2016-07-14 VITALS — BP 138/80 | HR 77 | Ht 68.0 in | Wt 226.0 lb

## 2016-07-14 DIAGNOSIS — I10 Essential (primary) hypertension: Secondary | ICD-10-CM | POA: Diagnosis not present

## 2016-07-14 DIAGNOSIS — R600 Localized edema: Secondary | ICD-10-CM

## 2016-07-14 DIAGNOSIS — R0609 Other forms of dyspnea: Secondary | ICD-10-CM | POA: Diagnosis not present

## 2016-07-14 DIAGNOSIS — R0600 Dyspnea, unspecified: Secondary | ICD-10-CM

## 2016-07-14 MED ORDER — CARVEDILOL 3.125 MG PO TABS: 3.1250 mg | ORAL_TABLET | Freq: Two times a day (BID) | ORAL | 3 refills | Status: DC

## 2016-07-14 NOTE — Progress Notes (Signed)
.   Follow-up Outpatient Visit Date: 07/14/2016  Primary Care Provider: Kerman PasseyLada, Melinda P, MD [address] [place] [id number]  Chief Complaint: Shortness of breath  HPI:  Drew [name] is a 76 y.o. year-old male with history of hypertension, hyperlipidemia, chronic kidney disease stage 3, GERD, and obesity, who presents for follow-up of shortness of breath. I last saw him on 02/27/16 for evaluation of abnormal ABI's. His vascular studies and symptoms were not consistent with significant PVD; further testing/intervention was deferred. However, Drew [name] noted intermittent leg edema and fatigue, prompting us to obtain a transthoracic echo (see details below).  Today, Drew [name] notes that he has been "giving out" easily due to shortness of breath. He finds that he is not able to work in his garden as long as last year. He denies chest pain, palpitations, lightheadedness, orthopnea, and PND. He continues to have mild leg edema. Of note, he reports an episode of exertional chest pain and shortness of breath a month ago. He was seen urgently by Eula Listenyan Dunn, PA, and referred for myocardial perfusion stress test. This was low risk without ischemia. He has not had any further episodes. The patient notes that he has gained 5-10 pounds over the last 6 months. He was also fairly sedentary during the winter due to bad weather.  -------------------------------------------------------------------------------------------------- Cardiovascular History & Procedures: Cardiovascular Problems:  Dyspnea on exertion  Leg pain  Risk Factors:  Hypertension, hyperlipidemia, diabetes mellitus, male gender, and age > 2055  Cath/PCI:  None  CV Surgery:  None  EP Procedures and Devices:  None  Non-Invasive Evaluation(s):  Pharmacologic MPI (06/09/16): Low risk study without ischemia. Normal wall motion with LVEF of 72%.  TTE (03/17/16): Normal LV size percent. There is grade 1 diastolic  dysfunction. Aortic valve is moderately thickened without stenosis. There is mild aortic regurgitation. Normal RV size and function.  Lower extremity arterial vascular study (12/22/15): ABIs right 1.5, left 1.6. TBIs right 1.3, left 1.1.  Recent CV Pertinent Labs: Lab Results  Component Value Date   CHOL 105 04/21/2016   HDL 29 (L) 04/21/2016   LDLCALC 47 04/21/2016   TRIG 144 04/21/2016   CHOLHDL 3.6 04/21/2016   INR 1.32 12/22/2013   K 4.8 02/09/2016   MG 1.9 12/21/2013   BUN 18 02/09/2016   CREATININE 1.57 (H) 02/09/2016    Past medical and surgical history were reviewed and updated in EPIC.  Outpatient Encounter Prescriptions as of 07/14/2016  Medication Sig  . allopurinol (ZYLOPRIM) 100 MG tablet Take 2 tablets (200 mg total) by mouth daily.  Marland Kitchen. amLODipine (NORVASC) 5 MG tablet Take 1 tablet (5 mg total) by mouth daily.  Marland Kitchen. aspirin EC 81 MG tablet Take 1 tablet (81 mg total) by mouth daily.  Marland Kitchen. atorvastatin (LIPITOR) 40 MG tablet Take 1 tablet (40 mg total) by mouth at bedtime.  . Coenzyme Q10 (CO Q 10) 100 MG CAPS Take 100 mg by mouth daily.  Marland Kitchen. levothyroxine (SYNTHROID, LEVOTHROID) 25 MCG tablet TAKE 1 TABLET BY MOUTH  EVERY MORNING  . Melatonin 10 MG TABS Take 1 tablet by mouth.  . Omega-3 Fatty Acids (FISH OIL) 1000 MG CAPS Take 1,000 mg by mouth daily.  . ranitidine (ZANTAC) 300 MG tablet Take 1 tablet (300 mg total) by mouth at bedtime.   No facility-administered encounter medications on file as of 07/14/2016.     Allergies: Niacin and related  Social History   Social History  . Marital status: Married  Spouse name: N/A  . Number of children: N/A  . Years of education: N/A   Occupational History  . Not on file.   Social History Main Topics  . Smoking status: Former Smoker    Types: Cigarettes, Cigars    Quit date: 64  . Smokeless tobacco: Never Used     Comment: Smoked afternoon cigar  . Alcohol use No  . Drug use: No  . Sexual activity: Not on file     Other Topics Concern  . Not on file   Social History Narrative  . No narrative on file    Family History  Problem Relation Age of Onset  . Hyperlipidemia Father   . Kidney disease Father   . Heart disease Father   . Congestive Heart Failure Mother     Review of Systems: A 12-system review of systems was performed and was negative except as noted in the HPI.  --------------------------------------------------------------------------------------------------  Physical Exam: BP 138/80 (BP Location: Left Arm, Patient Position: Sitting, Cuff Size: Normal)   Pulse 77   Ht 5\' 8"  (1.727 m)   Wt 226 lb (102.5 kg)   BMI 34.36 kg/m   General:  Obese man, seated comfortably on the exam table. He is accompanied by his wife. HEENT: No conjunctival pallor or scleral icterus.  Moist mucous membranes.  OP clear. Neck: Supple without lymphadenopathy, thyromegaly, JVD, or HJR. Lungs: Normal work of breathing.  Clear to auscultation bilaterally without wheezes or crackles. Heart: Regular rate and rhythm without murmurs, rubs, or gallops. Unable to assess PMI due to body habitus. Abd: Bowel sounds present.  Soft, NT/ND. Unable to assess HSM due to body habitus. Ext: Trace pretibial edema.  Radial, PT, and DP pulses are 2+ bilaterally. Skin: warm and dry without rash  Lab Results  Component Value Date   WBC 5.9 02/09/2016   HGB 16.1 02/09/2016   HCT 48.2 02/09/2016   MCV 96.2 02/09/2016   PLT 187 02/09/2016    Lab Results  Component Value Date   NA 140 02/09/2016   K 4.8 02/09/2016   CL 105 02/09/2016   CO2 21 02/09/2016   BUN 18 02/09/2016   CREATININE 1.57 (H) 02/09/2016   GLUCOSE 134 (H) 02/09/2016   ALT 36 04/21/2016    Lab Results  Component Value Date   CHOL 105 04/21/2016   HDL 29 (L) 04/21/2016   LDLCALC 47 04/21/2016   TRIG 144 04/21/2016   CHOLHDL 3.6 04/21/2016     --------------------------------------------------------------------------------------------------  ASSESSMENT AND PLAN: Dyspnea on exertion This is likely multifactorial, though I suspect it is largely driven by his obesity and conditioning. An element of diastolic dysfunction is also possible. We have agreed to start carvedilol 3.125 mg twice a day. If his symptoms persist, referral to pulmonology for further investigation of possible sleep apnea or other underlying lung disease will be considered.  Hypertension Blood pressure borderline elevated today. As above, we will add carvedilol. I have encouraged patient to follow a low-sodium diet and to lose weight.  Lower extremity edema This is probably a combination of venous insufficiency, chronic kidney disease, and diastolic dysfunction. I encouraged Mr. Macomber to elevate his legs and consider wearing compression stockings.  Follow-up: Return to clinic in 3 months.  Yvonne Kendall, MD 07/14/2016 10:11 AM

## 2016-07-14 NOTE — Patient Instructions (Addendum)
Medication Instructions:  Your physician has recommended you make the following change in your medication:  START taking coreg 3.125mg  twice daily   Labwork: none  Testing/Procedures: none  Follow-Up: Your physician recommends that you schedule a follow-up appointment in: 3 months with Dr. Okey Dupre.    Any Other Special Instructions Will Be Listed Below (If Applicable).     If you need a refill on your cardiac medications before your next appointment, please call your pharmacy.   DASH Eating Plan DASH stands for "Dietary Approaches to Stop Hypertension." The DASH eating plan is a healthy eating plan that has been shown to reduce high blood pressure (hypertension). It may also reduce your risk for type 2 diabetes, heart disease, and stroke. The DASH eating plan may also help with weight loss. What are tips for following this plan? General guidelines   Avoid eating more than 2,300 mg (milligrams) of salt (sodium) a day. If you have hypertension, you may need to reduce your sodium intake to 1,500 mg a day.  Limit alcohol intake to no more than 1 drink a day for nonpregnant women and 2 drinks a day for men. One drink equals 12 oz of beer, 5 oz of wine, or 1 oz of hard liquor.  Work with your health care provider to maintain a healthy body weight or to lose weight. Ask what an ideal weight is for you.  Get at least 30 minutes of exercise that causes your heart to beat faster (aerobic exercise) most days of the week. Activities may include walking, swimming, or biking.  Work with your health care provider or diet and nutrition specialist (dietitian) to adjust your eating plan to your individual calorie needs. Reading food labels   Check food labels for the amount of sodium per serving. Choose foods with less than 5 percent of the Daily Value of sodium. Generally, foods with less than 300 mg of sodium per serving fit into this eating plan.  To find whole grains, look for the word  "whole" as the first word in the ingredient list. Shopping   Buy products labeled as "low-sodium" or "no salt added."  Buy fresh foods. Avoid canned foods and premade or frozen meals. Cooking   Avoid adding salt when cooking. Use salt-free seasonings or herbs instead of table salt or sea salt. Check with your health care provider or pharmacist before using salt substitutes.  Do not fry foods. Cook foods using healthy methods such as baking, boiling, grilling, and broiling instead.  Cook with heart-healthy oils, such as olive, canola, soybean, or sunflower oil. Meal planning    Eat a balanced diet that includes:  5 or more servings of fruits and vegetables each day. At each meal, try to fill half of your plate with fruits and vegetables.  Up to 6-8 servings of whole grains each day.  Less than 6 oz of lean meat, poultry, or fish each day. A 3-oz serving of meat is about the same size as a deck of cards. One egg equals 1 oz.  2 servings of low-fat dairy each day.  A serving of nuts, seeds, or beans 5 times each week.  Heart-healthy fats. Healthy fats called Omega-3 fatty acids are found in foods such as flaxseeds and coldwater fish, like sardines, salmon, and mackerel.  Limit how much you eat of the following:  Canned or prepackaged foods.  Food that is high in trans fat, such as fried foods.  Food that is high in saturated fat, such  as fatty meat.  Sweets, desserts, sugary drinks, and other foods with added sugar.  Full-fat dairy products.  Do not salt foods before eating.  Try to eat at least 2 vegetarian meals each week.  Eat more home-cooked food and less restaurant, buffet, and fast food.  When eating at a restaurant, ask that your food be prepared with less salt or no salt, if possible. What foods are recommended? The items listed may not be a complete list. Talk with your dietitian about what dietary choices are best for you. Grains  Whole-grain or whole-wheat  bread. Whole-grain or whole-wheat pasta. Brown rice. Orpah Cobb. Bulgur. Whole-grain and low-sodium cereals. Pita bread. Low-fat, low-sodium crackers. Whole-wheat flour tortillas. Vegetables  Fresh or frozen vegetables (raw, steamed, roasted, or grilled). Low-sodium or reduced-sodium tomato and vegetable juice. Low-sodium or reduced-sodium tomato sauce and tomato paste. Low-sodium or reduced-sodium canned vegetables. Fruits  All fresh, dried, or frozen fruit. Canned fruit in natural juice (without added sugar). Meat and other protein foods  Skinless chicken or [place]. Ground chicken or [place]. Pork with fat trimmed off. Fish and seafood. Egg whites. Dried beans, peas, or lentils. Unsalted nuts, nut butters, and seeds. Unsalted canned beans. Lean cuts of beef with fat trimmed off. Low-sodium, lean deli meat. Dairy  Low-fat (1%) or fat-free (skim) milk. Fat-free, low-fat, or reduced-fat cheeses. Nonfat, low-sodium ricotta or cottage cheese. Low-fat or nonfat yogurt. Low-fat, low-sodium cheese. Fats and oils  Soft margarine without trans fats. Vegetable oil. Low-fat, reduced-fat, or light mayonnaise and salad dressings (reduced-sodium). Canola, safflower, olive, soybean, and sunflower oils. Avocado. Seasoning and other foods  Herbs. Spices. Seasoning mixes without salt. Unsalted popcorn and pretzels. Fat-free sweets. What foods are not recommended? The items listed may not be a complete list. Talk with your dietitian about what dietary choices are best for you. Grains  Baked goods made with fat, such as croissants, muffins, or some breads. Dry pasta or rice meal packs. Vegetables  Creamed or fried vegetables. Vegetables in a cheese sauce. Regular canned vegetables (not low-sodium or reduced-sodium). Regular canned tomato sauce and paste (not low-sodium or reduced-sodium). Regular tomato and vegetable juice (not low-sodium or reduced-sodium). Rosita Fire. Olives. Fruits  Canned fruit in a light or  heavy syrup. Fried fruit. Fruit in cream or butter sauce. Meat and other protein foods  Fatty cuts of meat. Ribs. Fried meat. Tomasa Blase. Sausage. Bologna and other processed lunch meats. Salami. Fatback. Hotdogs. Bratwurst. Salted nuts and seeds. Canned beans with added salt. Canned or smoked fish. Whole eggs or egg yolks. Chicken or [place] with skin. Dairy  Whole or 2% milk, cream, and half-and-half. Whole or full-fat cream cheese. Whole-fat or sweetened yogurt. Full-fat cheese. Nondairy creamers. Whipped toppings. Processed cheese and cheese spreads. Fats and oils  Butter. Stick margarine. Lard. Shortening. Ghee. Bacon fat. Tropical oils, such as coconut, palm kernel, or palm oil. Seasoning and other foods  Salted popcorn and pretzels. Onion salt, garlic salt, seasoned salt, table salt, and sea salt. Worcestershire sauce. Tartar sauce. Barbecue sauce. Teriyaki sauce. Soy sauce, including reduced-sodium. Steak sauce. Canned and packaged gravies. Fish sauce. Oyster sauce. Cocktail sauce. Horseradish that you find on the shelf. Ketchup. Mustard. Meat flavorings and tenderizers. Bouillon cubes. Hot sauce and Tabasco sauce. Premade or packaged marinades. Premade or packaged taco seasonings. Relishes. Regular salad dressings. Where to find more information:  National Heart, Lung, and Blood Institute: PopSteam.is  American Heart Association: www.heart.org Summary  The DASH eating plan is a healthy eating plan that has  been shown to reduce high blood pressure (hypertension). It may also reduce your risk for type 2 diabetes, heart disease, and stroke.  With the DASH eating plan, you should limit salt (sodium) intake to 2,300 mg a day. If you have hypertension, you may need to reduce your sodium intake to 1,500 mg a day.  When on the DASH eating plan, aim to eat more fresh fruits and vegetables, whole grains, lean proteins, low-fat dairy, and heart-healthy fats.  Work with your health care provider  or diet and nutrition specialist (dietitian) to adjust your eating plan to your individual calorie needs. This information is not intended to replace advice given to you by your health care provider. Make sure you discuss any questions you have with your health care provider. Document Released: 01/28/2011 Document Revised: 02/02/2016 Document Reviewed: 02/02/2016 Elsevier Interactive Patient Education  2017 [employer]

## 2016-08-03 DIAGNOSIS — R809 Proteinuria, unspecified: Secondary | ICD-10-CM | POA: Diagnosis not present

## 2016-08-03 DIAGNOSIS — E1129 Type 2 diabetes mellitus with other diabetic kidney complication: Secondary | ICD-10-CM | POA: Diagnosis not present

## 2016-08-03 DIAGNOSIS — R600 Localized edema: Secondary | ICD-10-CM | POA: Diagnosis not present

## 2016-08-03 DIAGNOSIS — N183 Chronic kidney disease, stage 3 (moderate): Secondary | ICD-10-CM | POA: Diagnosis not present

## 2016-08-03 DIAGNOSIS — I10 Essential (primary) hypertension: Secondary | ICD-10-CM | POA: Diagnosis not present

## 2016-08-04 ENCOUNTER — Telehealth: Payer: Self-pay

## 2016-08-04 NOTE — Telephone Encounter (Signed)
Patient states having a lot of arm and leg pain. I consulted with Dr. Sherie DonLada she states it may be due to chol med.  We will stop atovastatin for 2 weeks and re address symptoms at that time.  Patient notified to stop med and call back after 2 weeks.

## 2016-08-18 ENCOUNTER — Telehealth: Payer: Self-pay

## 2016-08-18 NOTE — Telephone Encounter (Signed)
Patient called  Back 2 weeks later since stopping chol. Med atorvastatin and states is doing much better and not having as much joint pain. Please advise

## 2016-08-19 ENCOUNTER — Ambulatory Visit: Payer: Medicare Other | Admitting: Family Medicine

## 2016-08-19 ENCOUNTER — Encounter: Payer: Self-pay | Admitting: Family Medicine

## 2016-08-24 ENCOUNTER — Other Ambulatory Visit: Payer: Self-pay

## 2016-08-24 DIAGNOSIS — E785 Hyperlipidemia, unspecified: Secondary | ICD-10-CM

## 2016-08-24 MED ORDER — PITAVASTATIN CALCIUM 2 MG PO TABS: 1.0000 | ORAL_TABLET | Freq: Every day | ORAL | 1 refills | Status: DC

## 2016-08-24 MED ORDER — PITAVASTATIN CALCIUM 2 MG PO TABS: 1.0000 | ORAL_TABLET | Freq: Every day | ORAL | 0 refills | Status: DC

## 2016-08-24 NOTE — Telephone Encounter (Signed)
Please let him know that I'm glad he's feeling bettert Add atorvastatin to reactions, myalgias We'll start livalo (Rx sent to mail order) This is very low dose, less likelihood of aches If any develop on new medicine, stop it and call us Recheck SGPT and lipids in 6 weeks (please order those) Thank you

## 2016-08-24 NOTE — Telephone Encounter (Signed)
Left detailed voicemail

## 2016-08-27 ENCOUNTER — Ambulatory Visit (INDEPENDENT_AMBULATORY_CARE_PROVIDER_SITE_OTHER): Payer: Medicare Other | Admitting: Family Medicine

## 2016-08-27 ENCOUNTER — Ambulatory Visit
Admission: RE | Admit: 2016-08-27 | Discharge: 2016-08-27 | Disposition: A | Discharge status: Home / Self Care | Payer: Medicare Other | Source: Ambulatory Visit | Attending: Family Medicine | Admitting: Family Medicine

## 2016-08-27 ENCOUNTER — Encounter: Payer: Self-pay | Admitting: Family Medicine

## 2016-08-27 VITALS — BP 136/78 | HR 87 | Temp 98.0°F | Resp 14 | Ht 68.0 in | Wt 222.7 lb

## 2016-08-27 DIAGNOSIS — E034 Atrophy of thyroid (acquired): Secondary | ICD-10-CM | POA: Diagnosis not present

## 2016-08-27 DIAGNOSIS — E119 Type 2 diabetes mellitus without complications: Secondary | ICD-10-CM

## 2016-08-27 DIAGNOSIS — M79621 Pain in right upper arm: Secondary | ICD-10-CM | POA: Diagnosis not present

## 2016-08-27 DIAGNOSIS — I129 Hypertensive chronic kidney disease with stage 1 through stage 4 chronic kidney disease, or unspecified chronic kidney disease: Secondary | ICD-10-CM

## 2016-08-27 DIAGNOSIS — M79601 Pain in right arm: Secondary | ICD-10-CM | POA: Insufficient documentation

## 2016-08-27 DIAGNOSIS — N183 Chronic kidney disease, stage 3 unspecified: Secondary | ICD-10-CM

## 2016-08-27 DIAGNOSIS — R109 Unspecified abdominal pain: Secondary | ICD-10-CM | POA: Diagnosis not present

## 2016-08-27 DIAGNOSIS — E669 Obesity, unspecified: Secondary | ICD-10-CM | POA: Diagnosis not present

## 2016-08-27 DIAGNOSIS — E785 Hyperlipidemia, unspecified: Secondary | ICD-10-CM

## 2016-08-27 NOTE — Assessment & Plan Note (Signed)
Unusual to have this much persistent pain and bruising for this long; will get humerus xray and then MRI

## 2016-08-27 NOTE — Assessment & Plan Note (Addendum)
Check A1c today; foot exam by MD; due for eye exam, patient was reminded to please schedule an appt

## 2016-08-27 NOTE — Assessment & Plan Note (Signed)
Well-controlled; seeing kidney doctor; avoid salt substitutes

## 2016-08-27 NOTE — Progress Notes (Signed)
BP 136/78   Pulse 87   Temp 98 F (36.7 C) (Oral)   Resp 14   Ht 5\' 8"  (1.727 m)   Wt 222 lb 11.2 oz (101 kg)   SpO2 94%   BMI 33.86 kg/m    Subjective:    Patient ID: Drew [name], male    DOB: March 08, 1940, 76 y.o.   MRN: [id number]  HPI: Drew [name] is a 76 y.o. male  Chief Complaint  Patient presents with  . Follow-up    6 month  . 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  Right arm pain and bruising for at least 6 weeks; deep purple bruise; feels like a vice grip closing in on him; hurts most of the time; no one big injury; feels like someone has been hitting him; puts ben gay with no real help; strength in good in the right arm; sometimes has tingling; feels funny in there sometimes  No chest pain; has some tenderness when moving to get his seatbelt, reaching over, left side abdomen laterally He has hiatal hernia, noted on MRI of abdomen in November 2017, as well as 4.4 cm dominant cyst LEFT kidney; due to have this re-imaged in a few months -------------------------------------------- IMPRESSION: 1. Study limited by lack of intravenous contrast material. Within this limitation, the patient has a markedly atrophic left kidney and numerous bilateral cystic lesions of varying sizes. No overtly suspicious lesion is identified in either kidney and the cysts likely represent a combination of Bosniak I and Bosniak II lesions. Surveillance could be used to ensure stability, as clinically warranted. 2. Small  liver cysts. 3. Small hiatal hernia.  Electronically Signed   By: Kennith CenterEric  Mansell M.D.   On: 01/22/2016 11:08 --------------------------------------------------- Obesity; used to weigh 160 pounds; nibbles and hungry all the time  [facility] 08/27/2016 02/09/2016 11/24/2015 08/07/2015 03/11/2015  Decreased Interest 0 0 0 0 0  Down, Depressed, Hopeless 0 1 0 0 0  PHQ - 2 Score 0 1 0 0 0    Relevant past medical, surgical, family and social history reviewed Past Medical History:  Diagnosis Date  . Chronic kidney disease, stage III (moderate) 08/15/2015  . Diabetes mellitus with complication (HCC)   . GERD (gastroesophageal reflux disease)   . H/O: upper GI bleed 2015  . Hypercholesteremia   . Hypertension   . Obesity (BMI 30-39.9) 08/07/2015  . Peripheral vascular disease (HCC) 12/26/2015   Past Surgical History:  Procedure Laterality Date  . COLONOSCOPY N/A 12/27/2013   Procedure: COLONOSCOPY;  Surgeon: Barrie FolkJohn C Hayes, MD;  [facility];  Service: Endoscopy;  Laterality: N/A;  . ENTEROSCOPY N/A 12/21/2013   Procedure: ENTEROSCOPY;  Surgeon: Vertell NovakJames L Edwards Jr., MD;  [facility];  Service: Endoscopy;  Laterality: N/A;  . ENTEROSCOPY N/A 12/27/2013   Procedure: ENTEROSCOPY;  Surgeon: Barrie FolkJohn C Hayes, MD;  [facility];  Service: Endoscopy;  Laterality: N/A;   Family History  Problem Relation Age  of Onset  . Hyperlipidemia Father   . Kidney disease Father   . Heart disease Father   . Congestive Heart Failure Mother   . Heart disease Mother   . Hyperlipidemia Daughter   . Hyperlipidemia Son   . Diabetes Son   . Anxiety disorder Daughter   . Depression Daughter   . Arthritis Daughter    Social History   Social History  . Marital status: Married    Spouse name: N/A  . Number of children: N/A  . Years of education: N/A   Occupational History  . Not on file.   Social History Main Topics  . Smoking status: Former Smoker    Types:  Cigarettes, Cigars    Quit date: 33  . Smokeless tobacco: Never Used     Comment: Smoked afternoon cigar  . Alcohol use No  . Drug use: No  . Sexual activity: Yes   Other Topics Concern  . Not on file   Social History Narrative  . No narrative on file    Interim medical history since last visit reviewed. Allergies and medications reviewed  Review of Systems Per HPI unless specifically indicated above     Objective:    BP 136/78   Pulse 87   Temp 98 F (36.7 C) (Oral)   Resp 14   Ht 5\' 8"  (1.727 m)   Wt 222 lb 11.2 oz (101 kg)   SpO2 94%   BMI 33.86 kg/m   Wt Readings from Last 3 Encounters:  08/27/16 222 lb 11.2 oz (101 kg)  07/14/16 226 lb (102.5 kg)  06/02/16 224 lb (101.6 kg)    Physical Exam  Constitutional: He appears well-developed and well-nourished. No distress.  HENT:  Head: Normocephalic and atraumatic.  Eyes: EOM are normal. No scleral icterus.  Neck: No JVD present. Carotid bruit is not present. No thyromegaly present.  Cardiovascular: Normal rate and regular rhythm.   Pulses:      Dorsalis pedis pulses are 1+ on the right side, and 1+ on the left side.  Feet are not cool or mottled  Pulmonary/Chest: Effort normal and breath sounds normal.  Musculoskeletal: He exhibits no edema.       Right upper arm: He exhibits no bony tenderness, no swelling and no edema.       Arms: Significant bruising over the anteromedial RIGHT bicep area; no mass, no retraction of the right biceps; no tenderness along biceps tendon  Neurological: He is alert.  Skin: Skin is warm and dry. No pallor.  Significant bruising along the medial and anterior aspect of the RIGHT upper arm, bicep area  Psychiatric: He has a normal mood and affect. His behavior is normal. Judgment and thought content normal. His mood appears not anxious. He does not exhibit a depressed mood.   Diabetic Foot Form - Detailed   Diabetic Foot Exam - detailed Diabetic Foot exam was performed with the  following findings:  Yes 08/27/2016 11:15 AM  Visual Foot Exam completed.:  Yes  Pulse Foot Exam completed.:  Yes  Right Dorsalis Pedis:  Present Left Dorsalis Pedis:  Present  Sensory Foot Exam Completed.:  Yes Semmes-Weinstein Monofilament Test R Site 1-Great Toe:  Pos L Site 1-Great Toe:  Pos        Results for orders placed or performed in visit on 08/27/16  Hemoglobin A1c  Result Value Ref Range   Hgb A1c MFr Bld 7.0 (H) <5.7 %   Mean Plasma Glucose 154 mg/dL  Assessment & Plan:   Problem List Items Addressed This Visit      Endocrine   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)    Last thyroid normal      Diabetes mellitus type 2, diet-controlled (HCC) - Primary (Chronic)    Check A1c today; foot exam by MD; due for eye exam, patient was reminded to please schedule an appt      Relevant Medications   enalapril (VASOTEC) 10 MG tablet   Other Relevant Orders   Hemoglobin A1c (Completed)     Genitourinary   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)    Well-controlled; seeing kidney doctor; avoid salt substitutes      Chronic kidney disease, stage III (moderate) (Chronic)    Last Cr was 1.33 at [facility] on 08/03/16; will get recheck Cr prior to MRI      Relevant Orders   Creatinine     Other   Obesity (BMI 30-39.9) (Chronic)    Encouraged weight loss      Dyslipidemia (high LDL; low HDL)    Encouraged healthier eating, limiting saturated fats, working on weight loss      Arm pain, medial, right    Unusual to have this much persistent pain and bruising for this long; will get humerus xray and then MRI      Relevant Orders   DG Humerus Right (Completed)   MR HUMERUS RIGHT W CONTRAST   Creatinine    Other Visit Diagnoses    Left flank pain       may be related to large cyst on the LEFT; patient to contact specialist to see if imaging due sooner since symptomatic       Follow up  plan: Return in about 3 months (around 11/27/2016) for twenty minute follow-up with fasting labs.  An after-visit summary was printed and given to the patient at check-out.  Please see the patient instructions which may contain other information and recommendations beyond what is mentioned above in the assessment and plan.  Meds ordered this encounter  Medications  . enalapril (VASOTEC) 10 MG tablet    Sig: Take 10 mg by mouth daily.  . hydrochlorothiazide (HYDRODIURIL) 12.5 MG tablet    Sig: Take 12.5 mg by mouth 3 (three) times a week.    Orders Placed This Encounter  Procedures  . DG Humerus Right  . MR HUMERUS RIGHT W CONTRAST  . Hemoglobin A1c  . Creatinine

## 2016-08-27 NOTE — Patient Instructions (Addendum)
Come in fasting for cholesterol check 6 weeks after you start the new cholesterol medicine Try to limit saturated fats in your diet (bologna, hot dogs, barbeque, cheeseburgers, hamburgers, steak, bacon, sausage, cheese, etc.) and get more fresh fruits, vegetables, and whole grains Let's get an MRI of the arm, but go today for the plain xray Try the rubbing alcohol trick for your feet Check out the information at familydoctor.org entitled "Nutrition for Weight Loss: What You Need to Know about Fad Diets" Try to lose between 1-2 pounds per week by taking in fewer calories and burning off more calories You can succeed by limiting portions, limiting foods dense in calories and fat, becoming more active, and drinking 8 glasses of water a day (64 ounces) Don't skip meals, especially breakfast, as skipping meals may alter your metabolism Do not use over-the-counter weight loss pills or gimmicks that claim rapid weight loss A healthy BMI (or body mass index) is between 18.5 and 24.9 You can calculate your ideal BMI at the NIH website JobEconomics.huhttp://www.nhlbi.nih.gov/health/educational/lose_wt/BMI/bmicalc.htm Try to lose 12 pounds over the next 3 months Try to cut back to no more than three egg yolks per week

## 2016-08-27 NOTE — Assessment & Plan Note (Signed)
Last thyroid normal

## 2016-08-28 LAB — HEMOGLOBIN A1C
HEMOGLOBIN A1C: 7 % — AB (ref <5.7)
Mean Plasma Glucose: 154 mg/dL

## 2016-08-30 ENCOUNTER — Telehealth: Payer: Self-pay

## 2016-08-30 MED ORDER — ROSUVASTATIN CALCIUM 5 MG PO TABS: 5.0000 mg | ORAL_TABLET | Freq: Every day | ORAL | 0 refills | Status: DC

## 2016-08-30 NOTE — Telephone Encounter (Signed)
Patient called the livalo is to expensive can we change to something else?

## 2016-08-30 NOTE — Telephone Encounter (Signed)
Okay to use a different cholesterol medicine; we'll try low dose Crestor; if that causes any aches/pains, then stop and call and we'll try either decreasing the frequency (maybe every other night) or some other plan Either way, we'll want to check SGPT and lipid panel after he's on a stable dose for 6 weeks

## 2016-08-31 NOTE — Telephone Encounter (Signed)
Patient notified

## 2016-09-02 NOTE — Assessment & Plan Note (Signed)
[facility] 08/03/16; will get recheck Cr prior to MRI

## 2016-09-02 NOTE — Assessment & Plan Note (Signed)
Encouraged healthier eating, limiting saturated fats, working on weight loss

## 2016-09-02 NOTE — Assessment & Plan Note (Signed)
Encouraged weight loss 

## 2016-09-07 ENCOUNTER — Other Ambulatory Visit: Payer: Self-pay | Admitting: *Deleted

## 2016-09-07 MED ORDER — CARVEDILOL 3.125 MG PO TABS: 3.1250 mg | ORAL_TABLET | Freq: Two times a day (BID) | ORAL | 3 refills | Status: DC

## 2016-09-30 ENCOUNTER — Other Ambulatory Visit: Payer: Self-pay | Admitting: Family Medicine

## 2016-09-30 DIAGNOSIS — M1A0610 Idiopathic chronic gout, right knee, without tophus (tophi): Secondary | ICD-10-CM

## 2016-09-30 NOTE — Telephone Encounter (Signed)
Last Cr and GFR reviewed, [facility] Rx approved

## 2016-10-05 ENCOUNTER — Other Ambulatory Visit: Payer: Self-pay | Admitting: Family Medicine

## 2016-10-05 NOTE — Telephone Encounter (Signed)
Looks like he has some overdue labs; please see 08/24/16; please ask him to have those done unless already done elsewhere I'll refill medicine

## 2016-10-06 NOTE — Telephone Encounter (Signed)
Patient notified

## 2016-10-20 ENCOUNTER — Encounter: Payer: Self-pay | Admitting: Internal Medicine

## 2016-10-20 ENCOUNTER — Ambulatory Visit (INDEPENDENT_AMBULATORY_CARE_PROVIDER_SITE_OTHER): Payer: Medicare Other | Admitting: Internal Medicine

## 2016-10-20 VITALS — BP 127/81 | HR 64 | Ht 68.0 in | Wt 225.5 lb

## 2016-10-20 DIAGNOSIS — R5382 Chronic fatigue, unspecified: Secondary | ICD-10-CM | POA: Insufficient documentation

## 2016-10-20 DIAGNOSIS — R0609 Other forms of dyspnea: Secondary | ICD-10-CM | POA: Diagnosis not present

## 2016-10-20 DIAGNOSIS — M79605 Pain in left leg: Secondary | ICD-10-CM | POA: Insufficient documentation

## 2016-10-20 DIAGNOSIS — M79604 Pain in right leg: Secondary | ICD-10-CM

## 2016-10-20 DIAGNOSIS — I10 Essential (primary) hypertension: Secondary | ICD-10-CM | POA: Diagnosis not present

## 2016-10-20 DIAGNOSIS — R0600 Dyspnea, unspecified: Secondary | ICD-10-CM | POA: Insufficient documentation

## 2016-10-20 MED ORDER — ROSUVASTATIN CALCIUM 5 MG PO TABS: 5.0000 mg | ORAL_TABLET | ORAL | 3 refills | Status: DC

## 2016-10-20 NOTE — Progress Notes (Signed)
.   Follow-up Outpatient Visit Date: 10/20/2016  Primary Care Provider: Kerman Passey, MD [address] [place] [address]  Chief Complaint: Fatigue  HPI:  Mr. Ridling is a 76 y.o. year-old male with history of hypertension, hyperlipidemia, chronic kidney disease stage 3, GERD, and obesity, who presents for follow-up of fatigue. I last saw him in May, which time Mr. Daywalt continued to have exertional dyspnea. We agreed to start low-dose carvedilol for treatment of possible diastolic dysfunction. Today, Mr. Barth reports that his breathing is better. He continues to have some fatigue. He has not had any chest pain or palpitations. He notes some orthostatic lightheadedness when he sits up quickly. Upon further questioning, Mr. Keeter and his wife note snoring; Mr. Mercer also has considerate daytime sleepiness, napping most days.  -------------------------------------------------------------------------------------------------- Cardiovascular History & Procedures: Cardiovascular Problems:  Dyspnea on exertion  Leg pain  Risk Factors:  Hypertension, hyperlipidemia, diabetes mellitus, male gender, and age > 85  Cath/PCI:  None  CV Surgery:  None  EP Procedures and Devices:  None  Non-Invasive Evaluation(s):  Pharmacologic MPI (06/09/16): Low risk study without ischemia. Normal wall motion with LVEF of 72%.  TTE (03/17/16): Normal LV size percent. There is grade 1 diastolic dysfunction. Aortic valve is moderately thickened without stenosis. There is mild aortic regurgitation. Normal RV size and function.  Lower extremity arterial vascular study (12/22/15): ABIs right 1.5, left 1.6. TBIs right 1.3, left 1.1.  Recent CV Pertinent Labs: Lab Results  Component Value Date   CHOL 105 04/21/2016   HDL 29 (L) 04/21/2016   LDLCALC 47 04/21/2016   TRIG 144 04/21/2016   CHOLHDL 3.6 04/21/2016   INR 1.32 12/22/2013   K 4.8 02/09/2016   MG 1.9 12/21/2013   BUN 18 02/09/2016   CREATININE 1.57 (H) 02/09/2016    Past medical and surgical history were reviewed and updated in EPIC.  Current Meds  Medication Sig  . allopurinol (ZYLOPRIM) 100 MG tablet TAKE 2 TABLETS BY MOUTH  DAILY  . carvedilol (COREG) 3.125 MG tablet Take 1 tablet (3.125 mg total) by mouth 2 (two) times daily.  . Coenzyme Q10 (CO Q 10) 100 MG CAPS Take 100 mg by mouth daily.  . enalapril (VASOTEC) 10 MG tablet Take 10 mg by mouth daily.  . hydrochlorothiazide (HYDRODIURIL) 12.5 MG tablet Take 12.5 mg by mouth 3 (three) times a week.  . levothyroxine (SYNTHROID, LEVOTHROID) 25 MCG tablet TAKE 1 TABLET BY MOUTH  EVERY MORNING  . Melatonin 10 MG TABS Take 1 tablet by mouth.  . Omega-3 Fatty Acids (FISH OIL) 1000 MG CAPS Take 1,000 mg by mouth daily.  . ranitidine (ZANTAC) 300 MG tablet Take 1 tablet (300 mg total) by mouth at bedtime.  . rosuvastatin (CRESTOR) 5 MG tablet TAKE 1 TABLET BY MOUTH AT  BEDTIME FOR CHOLESTEROL   Allergies: Niacin and related  Social History   Social History  . Marital status: Married    Spouse name: N/A  . Number of children: N/A  . Years of education: N/A   Occupational History  . Not on file.   Social History Main Topics  . Smoking status: Former Smoker    Types: Cigarettes, Cigars    Quit date: 53  . Smokeless tobacco: Never Used     Comment: Smoked afternoon cigar  . Alcohol use No  . Drug use: No  . Sexual activity: Yes   Other Topics Concern  . Not on file   Social  History Narrative  . No narrative on file    Family History  Problem Relation Age of Onset  . Hyperlipidemia Father   . Kidney disease Father   . Heart disease Father   . Congestive Heart Failure Mother   . Heart disease Mother   . Hyperlipidemia Daughter   . Hyperlipidemia Son   . Diabetes Son   . Anxiety disorder Daughter   . Depression Daughter   . Arthritis Daughter    Review of Systems: Review of Systems  Constitutional: Positive for  malaise/fatigue.  HENT: Negative.   Eyes: Negative.   Respiratory: Positive for shortness of breath (stable dyspnea on exertion).   Cardiovascular: Positive for claudication (Leg pain when walking, involving both muscles and joints. In both legs.). Negative for chest pain, palpitations, orthopnea, leg swelling and PND.  Gastrointestinal: Negative.   Genitourinary: Negative.   Musculoskeletal: Positive for myalgias.  Skin: Negative.   Neurological: Positive for dizziness (orthostatic lightheadedness).  Endo/Heme/Allergies: Negative.    --------------------------------------------------------------------------------------------------  Physical Exam: BP 127/81 (BP Location: Left Arm, Patient Position: Sitting, Cuff Size: Normal)   Pulse 64   Ht 5\' 8"  (1.727 m)   Wt 225 lb 8 oz (102.3 kg)   BMI 34.29 kg/m   General:  Obese man, seated comfortably in the exam room. He is accompanied by his wife. HEENT: No conjunctival pallor or scleral icterus.  Moist mucous membranes.  OP clear with dentures in place. Neck: Supple without lymphadenopathy, thyromegaly, JVD, or HJR.  No carotid bruit. Lungs: Normal work of breathing.  Coarse breath sounds bilaterally without wheezes or crackles. Heart: Regular rate and rhythm without murmurs, rubs, or gallops.  Non-displaced PMI. Abd: Bowel sounds present.  Soft, NT/ND without hepatosplenomegaly Ext: No lower extremity edema.  Radial, PT, and DP pulses are 2+ bilaterally. Skin: Warm and dry without rash.  EKG: Normal sinus rhythm with left axis deviation, low voltage, and nonspecific T-wave changes. No significant change from 06/02/16.   Lab Results  Component Value Date   WBC 5.9 02/09/2016   HGB 16.1 02/09/2016   HCT 48.2 02/09/2016   MCV 96.2 02/09/2016   PLT 187 02/09/2016    Lab Results  Component Value Date   NA 140 02/09/2016   K 4.8 02/09/2016   CL 105 02/09/2016   CO2 21 02/09/2016   BUN 18 02/09/2016   CREATININE 1.57 (H)  02/09/2016   GLUCOSE 134 (H) 02/09/2016   ALT 36 04/21/2016    Lab Results  Component Value Date   CHOL 105 04/21/2016   HDL 29 (L) 04/21/2016   LDLCALC 47 04/21/2016   TRIG 144 04/21/2016   CHOLHDL 3.6 04/21/2016    --------------------------------------------------------------------------------------------------  ASSESSMENT AND PLAN: Dyspnea on exertion and chronic fatigue Symptoms are stable to improved with addition of low-dose carvedilol. Chronic fatigue with history of snoring and obesity raised the possibility of sleep apnea. I will refer [name] to pulmonology for consideration of a sleep study.  Hypertension Blood pressure is reasonably well controlled today. No further changes at this time.  Lower extremity pain Some elements of discomfort are concerning for claudication, though certainly musculoskeletal etiology is also a consideration. ABIs earlier this year were not consistent with obstructive PAD. Pedal pulses are palpable again on exam today. We have agreed to decrease rosuvastatin to 5 mg every other day to see if this improves Mr. Vanorman's symptoms, as he has noted some benefit from a statin holiday in the past.  Follow-up: Return to clinic in 3-4  months.  Yvonne Kendall, MD 10/20/2016 10:34 AM

## 2016-10-20 NOTE — Patient Instructions (Signed)
Medication Instructions:  Your physician has recommended you make the following change in your medication:  1- CHANGE Crestor to 5 mg (1 tablet) by mouth every other day.   Labwork: none  Testing/Procedures: none  Follow-Up: You have been referred to Pulmonology for evaluation for sleep study.  Your physician recommends that you schedule a follow-up appointment in: 3-4 MONTHS WITH DR END.   If you need a refill on your cardiac medications before your next appointment, please call your pharmacy.

## 2016-10-22 ENCOUNTER — Encounter: Payer: Self-pay | Admitting: Pulmonary Disease

## 2016-10-22 ENCOUNTER — Ambulatory Visit (INDEPENDENT_AMBULATORY_CARE_PROVIDER_SITE_OTHER): Payer: Medicare Other | Admitting: Pulmonary Disease

## 2016-10-22 VITALS — BP 110/68 | HR 67 | Ht 68.0 in | Wt 225.0 lb

## 2016-10-22 DIAGNOSIS — G4700 Insomnia, unspecified: Secondary | ICD-10-CM

## 2016-10-22 DIAGNOSIS — G4719 Other hypersomnia: Secondary | ICD-10-CM

## 2016-10-22 DIAGNOSIS — E6609 Other obesity due to excess calories: Secondary | ICD-10-CM | POA: Diagnosis not present

## 2016-10-22 DIAGNOSIS — R0683 Snoring: Secondary | ICD-10-CM

## 2016-10-22 NOTE — Patient Instructions (Signed)
Polysomnogram (sleep study) ordered  We discussed weight loss and dietary strategies  Follow-up after sleep study has been performed. This will be scheduled at a later date.

## 2016-10-22 NOTE — Progress Notes (Signed)
PULMONARY/SLEEP CONSULT NOTE  Requesting MD/Service: Yvonne Kendallhristopher End, MD Date of initial consultation: 08/31 Reason for consultation: Snoring, obesity, suspected OSA  PT PROFILE: 76 y.o. male former smoker referred for evaluation of fatigue and daytime sleepiness  DATA: 03/17/16 echocardiogram: Grade 1 diastolic dysfunction. Otherwise normal.  HPI:  Patient is referred by Dr. Okey DupreEnd for evaluation of fatigue and daytime hypersomnolence. The patient describes feeling "tired" all the time. He gets profoundly fatigued after doing routine chores such as mowing the lawn. He attributes this to prescribed changes in his diet. In his words "I can only eat chicken". He also reports significant daytime sleepiness with an Epworth sleepiness scale score of 16. His wife reports mild snoring but no witnessed apneas. He has occasional morning headaches.  He also reports frequent early morning awakenings and "tossing and turning" all night long. When he awakens in the morning it's usually around 2 or 3 AM and he has difficulty getting back to sleep. He has tried melatonin which has provided him incomplete relief from this symptom.  Past Medical History:  Diagnosis Date  . Chronic kidney disease, stage III (moderate) 08/15/2015  . Diabetes mellitus with complication (HCC)   . GERD (gastroesophageal reflux disease)   . H/O: upper GI bleed 2015  . Hypercholesteremia   . Hypertension   . Obesity (BMI 30-39.9) 08/07/2015  . Peripheral vascular disease (HCC) 12/26/2015    Past Surgical History:  Procedure Laterality Date  . COLONOSCOPY N/A 12/27/2013   Procedure: COLONOSCOPY;  Surgeon: Barrie FolkJohn C Hayes, MD;  [facility];  Service: Endoscopy;  Laterality: N/A;  . ENTEROSCOPY N/A 12/21/2013   Procedure: ENTEROSCOPY;  Surgeon: Vertell NovakJames L Edwards Jr., MD;  [facility];  Service: Endoscopy;  Laterality: N/A;  . ENTEROSCOPY N/A 12/27/2013   Procedure: ENTEROSCOPY;  Surgeon: Barrie FolkJohn C Hayes, MD;   [facility];  Service: Endoscopy;  Laterality: N/A;    MEDICATIONS: I have reviewed all medications and confirmed regimen as documented  Social History   Social History  . Marital status: Married    Spouse name: N/A  . Number of children: N/A  . Years of education: N/A   Occupational History  . Not on file.   Social History Main Topics  . Smoking status: Former Smoker    Types: Cigarettes, Cigars    Quit date: 51990  . Smokeless tobacco: Never Used     Comment: Smoked afternoon cigar  . Alcohol use No  . Drug use: No  . Sexual activity: Yes   Other Topics Concern  . Not on file   Social History Narrative  . No narrative on file    Family History  Problem Relation Age of Onset  . Hyperlipidemia Father   . Kidney disease Father   . Heart disease Father   . Congestive Heart Failure Mother   . Heart disease Mother   . Hyperlipidemia Daughter   . Hyperlipidemia Son   . Diabetes Son   . Anxiety disorder Daughter   . Depression Daughter   . Arthritis Daughter     ROS: No fever, myalgias/arthralgias, unexplained weight loss or weight gain No new focal weakness or sensory deficits No otalgia, hearing loss, visual changes, nasal and sinus symptoms, mouth and throat problems No neck pain or adenopathy No abdominal pain, N/V/D, diarrhea, change in bowel pattern No dysuria, change in urinary pattern   Vitals:   10/22/16 0830 10/22/16 0833  BP:  110/68  Pulse:  67  SpO2:  93%  Weight:  102.1 kg (225 lb)   Height: 5\' 8"  (1.727 m)      EXAM:  Gen: WDWN, No overt respiratory distress HEENT: NCAT, sclera white, oropharynx normal, nares patent with normal nasal mucosa Neck: Supple without LAN, thyromegaly, JVD Lungs: breath sounds full without wheezes or other adventitious sounds Cardiovascular: RRR, no murmurs noted Abdomen: Soft, nontender, normal BS Ext: without clubbing, cyanosis, edema Neuro: CNs grossly intact, motor and sensory intact Skin:  Limited exam, no lesions noted  DATA:   BMP Latest Ref Rng & Units 02/09/2016 10/20/2015 08/07/2015  Glucose 65 - 99 mg/dL 469(G) 295(M) 841(L)  BUN 7 - 25 mg/dL 18 16 14   Creatinine 0.70 - 1.18 mg/dL 2.44(W) 1.02(V) 2.53(G)  Sodium 135 - 146 mmol/L 140 142 142  Potassium 3.5 - 5.3 mmol/L 4.8 4.3 4.2  Chloride 98 - 110 mmol/L 105 105 106  CO2 20 - 31 mmol/L 21 25 21   Calcium 8.6 - 10.3 mg/dL 64.4 9.9 9.9    CBC Latest Ref Rng & Units 02/09/2016 12/28/2013 12/28/2013  WBC 3.8 - 10.8 K/uL 5.9 - 7.1  Hemoglobin 13.2 - 17.1 g/dL 03.4 7.4(Q) 5.9(D)  Hematocrit 38.5 - 50.0 % 48.2 26.5(L) 26.1(L)  Platelets 140 - 400 K/uL 187 - 143(L)    CXR:  No recent  IMPRESSION:     ICD-10-CM   1. Daytime hypersomnolence G47.19   2. Insomnia, unspecified type G47.00   3. 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 Snoring R06.83    It is likely that he has some degree of sleep apnea. It is unclear whether this fully accounts for all of his symptoms as he has difficulty distinguishing sleepiness from fatigue or lack of energy. It is also unclear whether the early morning awakenings are related to sleep apnea or some other process.  PLAN:  -Sleep study ordered -After results are available we will decide on specific interventions and follow-up -We discussed dietary changes that might benefit him with regard to weight loss. Specifically recommended a restricted/moderate carbohydrate diet with avoidance of simple sugars and on refined gr  Billy Fischer, MD PCCM service Mobile [phone] Pager [phone] 10/22/2016 9:35 AM

## 2016-11-11 ENCOUNTER — Encounter: Payer: Self-pay | Admitting: Internal Medicine

## 2016-11-11 DIAGNOSIS — R0683 Snoring: Secondary | ICD-10-CM

## 2016-11-11 DIAGNOSIS — G4719 Other hypersomnia: Secondary | ICD-10-CM

## 2016-11-12 DIAGNOSIS — G4733 Obstructive sleep apnea (adult) (pediatric): Secondary | ICD-10-CM | POA: Diagnosis not present

## 2016-11-17 ENCOUNTER — Telehealth: Payer: Self-pay | Admitting: *Deleted

## 2016-11-17 DIAGNOSIS — G4733 Obstructive sleep apnea (adult) (pediatric): Secondary | ICD-10-CM

## 2016-11-17 NOTE — Telephone Encounter (Signed)
Pt informed of HST results. Order placed for CPAP.

## 2016-11-30 ENCOUNTER — Encounter: Payer: Self-pay | Admitting: Family Medicine

## 2016-11-30 ENCOUNTER — Ambulatory Visit (INDEPENDENT_AMBULATORY_CARE_PROVIDER_SITE_OTHER): Payer: Medicare Other | Admitting: Family Medicine

## 2016-11-30 VITALS — BP 132/80 | HR 75 | Temp 98.4°F | Resp 16 | Wt 224.3 lb

## 2016-11-30 DIAGNOSIS — E034 Atrophy of thyroid (acquired): Secondary | ICD-10-CM | POA: Diagnosis not present

## 2016-11-30 DIAGNOSIS — Z23 Encounter for immunization: Secondary | ICD-10-CM

## 2016-11-30 DIAGNOSIS — K257 Chronic gastric ulcer without hemorrhage or perforation: Secondary | ICD-10-CM

## 2016-11-30 DIAGNOSIS — I10 Essential (primary) hypertension: Secondary | ICD-10-CM

## 2016-11-30 DIAGNOSIS — E669 Obesity, unspecified: Secondary | ICD-10-CM | POA: Diagnosis not present

## 2016-11-30 DIAGNOSIS — Z5181 Encounter for therapeutic drug level monitoring: Secondary | ICD-10-CM | POA: Diagnosis not present

## 2016-11-30 DIAGNOSIS — N183 Chronic kidney disease, stage 3 unspecified: Secondary | ICD-10-CM

## 2016-11-30 DIAGNOSIS — E785 Hyperlipidemia, unspecified: Secondary | ICD-10-CM | POA: Diagnosis not present

## 2016-11-30 DIAGNOSIS — M1A0710 Idiopathic chronic gout, right ankle and foot, without tophus (tophi): Secondary | ICD-10-CM

## 2016-11-30 DIAGNOSIS — E119 Type 2 diabetes mellitus without complications: Secondary | ICD-10-CM

## 2016-11-30 NOTE — Assessment & Plan Note (Signed)
Check liver and kidneys 

## 2016-11-30 NOTE — Assessment & Plan Note (Signed)
Encouragement given to lose weight, ten pounds before next visit

## 2016-11-30 NOTE — Assessment & Plan Note (Signed)
Knows to avoid NSAIDs 

## 2016-11-30 NOTE — Assessment & Plan Note (Signed)
Praise given for limiting fatty meats; check lipids today (today is fasting)

## 2016-11-30 NOTE — Assessment & Plan Note (Signed)
Check uric acid. 

## 2016-11-30 NOTE — Patient Instructions (Signed)
Try to lose 10 pounds before your next visit Avoid salt as much as possible If you need something for aches or pains, try to use Tylenol (acetaminophen) instead of non-steroidals (which include Aleve, ibuprofen, Advil, Motrin, and naproxen); non-steroidals can cause long-term kidney damage Try to limit saturated fats in your diet (bologna, hot dogs, barbeque, cheeseburgers, hamburgers, steak, bacon, sausage, cheese, etc.) and get more fresh fruits, vegetables, and whole grains

## 2016-11-30 NOTE — Assessment & Plan Note (Signed)
Foot exam today. 

## 2016-11-30 NOTE — Assessment & Plan Note (Signed)
Check TSH 

## 2016-11-30 NOTE — Assessment & Plan Note (Signed)
Controlled today; avoid extra salt and use other spices

## 2016-11-30 NOTE — Progress Notes (Signed)
BP 132/80   Pulse 75   Temp 98.4 F (36.9 C) (Oral)   Resp 16   Wt 224 lb 4.8 oz (101.7 kg)   SpO2 93%   BMI 34.10 kg/m    Subjective:    Patient ID: Drew [name], male    DOB: 10-Apr-1940, 76 y.o.   MRN: [id number]  HPI: Drew [name] is a 76 y.o. male  Chief Complaint  Patient presents with  . Follow-up    HPI Patient is here with his wife for f/u  He got his riding mower fixed finally; before that was fixed, he was push mowed yard and field, overdid it; pain in both legs from 1/4 of the way below the knee down; thinks he just overdid it; lasted hours; tingilng a little bit but eased up; soaked his feet and that helped by next morning; had circulation tested last year and saw the vein and vascular doctor  CKD; seeing kidney doctor this week; he knows to avoid NSAIDs; stayng hydrated with water  Type 2 diabetes; not checking sugars right now; machine broke; no dry mouth or blurred vision; last eye exam  High cholesterol; on coQ10 and fish oil and rosuvastatin; gave up everything but bacon  Gout; on allopurinol; last gout flare a while ago  HTN; checks BP away from the doctor; 130s and 140s, sometimes higher and lower; does eat some salty foods  Hypothyroidism Lab Results  Component Value Date   TSH 1.34 02/09/2016    Depression screen [facility] 2/9 11/30/2016 08/27/2016 02/09/2016 11/24/2015 08/07/2015  Decreased Interest 0 0 0 0 0  Down, Depressed, Hopeless 0 0 1 0 0  PHQ - 2 Score 0 0 1 0 0    Relevant past medical, surgical, family and social history reviewed Past Medical History:  Diagnosis Date  . Chronic kidney disease, stage III (moderate) (HCC) 08/15/2015  . Diabetes mellitus with complication (HCC)   . GERD (gastroesophageal reflux disease)   . H/O: upper GI bleed 2015  . Hypercholesteremia   . Hypertension   . Obesity (BMI 30-39.9) 08/07/2015  . Peripheral vascular disease (HCC) 12/26/2015   Past Surgical History:  Procedure Laterality Date  . COLONOSCOPY  N/A 12/27/2013   Procedure: COLONOSCOPY;  Surgeon: Barrie Folk, MD;  Location[facility] ENDOSCOPY;  Service: Endoscopy;  Laterality: N/A;  . ENTEROSCOPY N/A 12/21/2013   Procedure: ENTEROSCOPY;  Surgeon: Vertell Novak., MD;  [facility] ENDOSCOPY;  Service: Endoscopy;  Laterality: N/A;  . ENTEROSCOPY N/A 12/27/2013   Procedure: ENTEROSCOPY;  Surgeon: Barrie Folk, MD;  Location[facility]COPY;  Service: Endoscopy;  Laterality: N/A;   Family History  Problem Relation Age of Onset  . Hyperlipidemia Father   . Kidney disease Father   . Heart disease Father   . Congestive Heart Failure Mother   . Heart disease Mother   . Hyperlipidemia Daughter   . Hyperlipidemia Son   . Diabetes Son   . Anxiety disorder Daughter   . Depression Daughter   . Arthritis Daughter    Social History   Social History  . Marital status: Married    Spouse name: N/A  . Number of children: N/A  . Years of education: N/A   Occupational History  . Not on file.   Social History Main Topics  . Smoking status: Former Smoker    Types: Cigarettes, Cigars    Quit date: 54  . Smokeless tobacco: Never Used     Comment: Smoked afternoon  cigar  . Alcohol use No  . Drug use: No  . Sexual activity: Yes   Other Topics Concern  . Not on file   Social History Narrative  . No narrative on file    Interim medical history since last visit reviewed. Allergies and medications reviewed  Review of Systems Per HPI unless specifically indicated above     Objective:    BP 132/80   Pulse 75   Temp 98.4 F (36.9 C) (Oral)   Resp 16   Wt 224 lb 4.8 oz (101.7 kg)   SpO2 93%   BMI 34.10 kg/m   Wt Readings from Last 3 Encounters:  11/30/16 224 lb 4.8 oz (101.7 kg)  10/22/16 225 lb (102.1 kg)  10/20/16 225 lb 8 oz (102.3 kg)    Physical Exam  Constitutional: He appears well-developed and well-nourished. No distress.  HENT:  Head: Normocephalic and atraumatic.  Eyes: EOM are normal. No scleral icterus.    Neck: No JVD present.  Cardiovascular: Normal rate and regular rhythm.   Pulses:      Dorsalis pedis pulses are 1+ on the right side, and 1+ on the left side.  Feet are not cool or mottled  Pulmonary/Chest: Effort normal and breath sounds normal.  Abdominal: Soft. Bowel sounds are normal. He exhibits no distension.  Musculoskeletal: He exhibits no edema.       Arms: (the bruising was last visit -- I cannot get rid of the diagram)  Neurological: He is alert. Coordination normal.  Skin: Skin is warm and dry. No pallor.  Psychiatric: He has a normal mood and affect. His behavior is normal. Judgment and thought content normal. His mood appears not anxious. He does not exhibit a depressed mood.   Diabetic Foot Form - Detailed   Diabetic Foot Exam - detailed Diabetic Foot exam was performed with the following findings:  Yes 11/30/2016  8:55 AM  Visual Foot Exam completed.:  Yes  Pulse Foot Exam completed.:  Yes  Right Dorsalis Pedis:  Diminished Left Dorsalis Pedis:  Diminished  Sensory Foot Exam Completed.:  Yes Semmes-Weinstein Monofilament Test R Site 1-Great Toe:  Pos L Site 1-Great Toe:  Pos        Results for orders placed or performed in visit on 08/27/16  Hemoglobin A1c  Result Value Ref Range   Hgb A1c MFr Bld 7.0 (H) <5.7 %   Mean Plasma Glucose 154 mg/dL      Assessment & Plan:   Problem List Items Addressed This Visit      Cardiovascular and Mediastinum   Essential hypertension (Chronic)    Controlled today; avoid extra salt and use other spices      Relevant Medications   amLODipine (NORVASC) 5 MG tablet     Digestive   Chronic multiple gastric erosions    Knows to avoid NSAIDs        Endocrine   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)    Check TSH      Relevant Orders   TSH   Diabetes mellitus type 2, diet-controlled (HCC) - Primary (Chronic)    Foot exam today      Relevant Orders   Hemoglobin A1c     Musculoskeletal and  Integument   Chronic gout of ankle    Check uric acid      Relevant Orders   Uric acid     Genitourinary   Chronic kidney disease, stage III (moderate) (HCC) (Chronic)  Avoid NSAIDs, staying hydrated        Other   Obesity (BMI 30-39.9) (Chronic)    Encouragement given to lose weight, ten pounds before next visit      Medication monitoring encounter    Check liver and kidneys      Relevant Orders   COMPLETE METABOLIC PANEL WITH GFR   Hyperlipidemia LDL goal <100    Praise given for limiting fatty meats; check lipids today (today is fasting)      Relevant Medications   amLODipine (NORVASC) 5 MG tablet   Other Relevant Orders   Lipid panel    Other Visit Diagnoses    Needs flu shot       Relevant Orders   Flu vaccine HIGH DOSE PF (Fluzone High dose) (Completed)       Follow up plan: Return in about 6 months (around 05/31/2017) for twenty minute follow-up with fasting labs.  An after-visit summary was printed and given to the patient at check-out.  Please see the patient instructions which may contain other information and recommendations beyond what is mentioned above in the assessment and plan.  Meds ordered this encounter  Medications  . amLODipine (NORVASC) 5 MG tablet    Sig: Take 5 mg by mouth daily.    Orders Placed This Encounter  Procedures  . Flu vaccine HIGH DOSE PF (Fluzone High dose)  . COMPLETE METABOLIC PANEL WITH GFR  . Hemoglobin A1c  . Lipid panel  . Uric acid  . TSH

## 2016-11-30 NOTE — Assessment & Plan Note (Signed)
Avoid NSAIDs, staying hydrated

## 2016-12-01 LAB — COMPLETE METABOLIC PANEL WITH GFR
AG Ratio: 1.9 (calc) (ref 1.0–2.5)
ALBUMIN MSPROF: 4.6 g/dL (ref 3.6–5.1)
ALKALINE PHOSPHATASE (APISO): 54 U/L (ref 40–115)
ALT: 25 U/L (ref 9–46)
AST: 22 U/L (ref 10–35)
BUN/Creatinine Ratio: 8 (calc) (ref 6–22)
BUN: 11 mg/dL (ref 7–25)
CALCIUM: 9.7 mg/dL (ref 8.6–10.3)
CO2: 26 mmol/L (ref 20–32)
CREATININE: 1.34 mg/dL — AB (ref 0.70–1.18)
Chloride: 104 mmol/L (ref 98–110)
GFR, EST NON AFRICAN AMERICAN: 51 mL/min/{1.73_m2} — AB (ref >=60)
GFR, Est African American: 59 mL/min/{1.73_m2} — ABNORMAL LOW (ref >=60)
GLUCOSE: 144 mg/dL — AB (ref 65–99)
Globulin: 2.4 g/dL (calc) (ref 1.9–3.7)
Potassium: 4.3 mmol/L (ref 3.5–5.3)
Sodium: 139 mmol/L (ref 135–146)
Total Bilirubin: 0.9 mg/dL (ref 0.2–1.2)
Total Protein: 7 g/dL (ref 6.1–8.1)

## 2016-12-01 LAB — LIPID PANEL
CHOL/HDL RATIO: 3.8 (calc) (ref <5.0)
CHOLESTEROL: 121 mg/dL (ref <200)
HDL: 32 mg/dL — ABNORMAL LOW (ref >40)
LDL CHOLESTEROL (CALC): 60 mg/dL
NON-HDL CHOLESTEROL (CALC): 89 mg/dL (ref <130)
TRIGLYCERIDES: 249 mg/dL — AB (ref <150)

## 2016-12-01 LAB — TSH: TSH: 2.08 mIU/L (ref 0.40–4.50)

## 2016-12-01 LAB — HEMOGLOBIN A1C
Hgb A1c MFr Bld: 6.9 % of total Hgb — ABNORMAL HIGH (ref <5.7)
MEAN PLASMA GLUCOSE: 151 (calc)
eAG (mmol/L): 8.4 (calc)

## 2016-12-01 LAB — URIC ACID: Uric Acid, Serum: 7.3 mg/dL (ref 4.0–8.0)

## 2016-12-02 DIAGNOSIS — R809 Proteinuria, unspecified: Secondary | ICD-10-CM | POA: Diagnosis not present

## 2016-12-02 DIAGNOSIS — E119 Type 2 diabetes mellitus without complications: Secondary | ICD-10-CM | POA: Diagnosis not present

## 2016-12-02 DIAGNOSIS — N261 Atrophy of kidney (terminal): Secondary | ICD-10-CM | POA: Diagnosis not present

## 2016-12-02 DIAGNOSIS — R600 Localized edema: Secondary | ICD-10-CM | POA: Diagnosis not present

## 2016-12-02 DIAGNOSIS — N183 Chronic kidney disease, stage 3 (moderate): Secondary | ICD-10-CM | POA: Diagnosis not present

## 2016-12-02 DIAGNOSIS — E1129 Type 2 diabetes mellitus with other diabetic kidney complication: Secondary | ICD-10-CM | POA: Diagnosis not present

## 2016-12-06 ENCOUNTER — Encounter: Payer: Self-pay | Admitting: Family Medicine

## 2016-12-20 ENCOUNTER — Other Ambulatory Visit: Payer: Self-pay | Admitting: Internal Medicine

## 2017-01-03 ENCOUNTER — Other Ambulatory Visit: Payer: Self-pay | Admitting: Family Medicine

## 2017-01-03 DIAGNOSIS — M1A0610 Idiopathic chronic gout, right knee, without tophus (tophi): Secondary | ICD-10-CM

## 2017-01-10 DIAGNOSIS — I10 Essential (primary) hypertension: Secondary | ICD-10-CM | POA: Diagnosis not present

## 2017-01-10 DIAGNOSIS — G4733 Obstructive sleep apnea (adult) (pediatric): Secondary | ICD-10-CM | POA: Diagnosis not present

## 2017-02-07 ENCOUNTER — Other Ambulatory Visit: Payer: Self-pay | Admitting: Family Medicine

## 2017-02-07 NOTE — Telephone Encounter (Signed)
Too soon for refill Last refill to mail order was March 29, 2016

## 2017-02-08 NOTE — Progress Notes (Signed)
Follow-up Outpatient Visit Date: 02/09/2017  Primary Care Provider: Kerman Passey, MD [address] [place] [address]  Chief Complaint: Dyspnea on exertion  HPI:  Drew [name] is a 76 y.o. year-old male with history of hypertension, hyperlipidemia, chronic kidney disease stage 3, GERD, and obesity, who presents for follow-up of dyspnea on exertion. I last saw Drew [name] late August, at which time he reported decreased shortness of breath with low-dose carvedilol. Due to snoring and daytime somnolence, I referred Drew [name] to pulmonary for consideration of a sleep study. 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 We also decreased rosuvastatin to 5 mg every other day due to pain in his lower extremities.  Today, Drew [name] reports that he continues to feel better with less exertional dyspnea and more energy. He reports that his shortness of breath is minimal. He is sleeping better since starting to use CPAP. He has intermittent issues with the mask fitting well, though overall he is tolerating CPAP well. He denies chest pain, palpitations, and orthopnea. He has occasional ankle edema associated with gout flairs; he denies pretibial swelling. He notes occasional orthostatic lightheadedness, especially in the mornings. He has not fallen. Myalgias have resolved with rosuvastatin reduction to 5 mg every other day.  --------------------------------------------------------------------------------------------------  Cardiovascular History & Procedures: Cardiovascular Problems:  Dyspnea on exertion  Leg pain  Risk Factors:  Hypertension, hyperlipidemia, diabetes mellitus, male gender, and age > 97  Cath/PCI:  None  CV Surgery:  None  EP Procedures and Devices:  None  Non-Invasive Evaluation(s):  Pharmacologic MPI (06/09/16): Low risk study without ischemia. Normal wall motion with LVEF of 72%.  TTE (03/17/16): Normal LV size percent.  There is grade 1 diastolic dysfunction. Aortic valve is moderately thickened without stenosis. There is mild aortic regurgitation. Normal RV size and function.  Lower extremity arterial vascular study (12/22/15): ABIs right 1.5, left 1.6. TBIsright 1.3, left 1.1.  Recent CV Pertinent Labs: Lab Results  Component Value Date   CHOL 121 11/30/2016   HDL 32 (L) 11/30/2016   LDLCALC 47 04/21/2016   TRIG 249 (H) 11/30/2016   CHOLHDL 3.8 11/30/2016   INR 1.32 12/22/2013   K 4.3 11/30/2016   MG 1.9 12/21/2013   BUN 11 11/30/2016   CREATININE 1.34 (H) 11/30/2016    Past medical and surgical history were reviewed and updated in EPIC.  Current Meds  Medication Sig  . allopurinol (ZYLOPRIM) 100 MG tablet TAKE 2 TABLETS BY MOUTH  DAILY  . amLODipine (NORVASC) 5 MG tablet Take 5 mg by mouth daily.  Marland Kitchen aspirin EC 81 MG tablet Take 81 mg by mouth daily.  . carvedilol (COREG) 3.125 MG tablet TAKE 1 TABLET BY MOUTH TWO  TIMES DAILY  . Coenzyme Q10 (CO Q 10) 100 MG CAPS Take 100 mg by mouth daily.  . enalapril (VASOTEC) 10 MG tablet Take 10 mg by mouth daily.  . hydrochlorothiazide (HYDRODIURIL) 12.5 MG tablet Take 12.5 mg by mouth 3 (three) times a week.  . levothyroxine (SYNTHROID, LEVOTHROID) 25 MCG tablet TAKE 1 TABLET BY MOUTH  EVERY MORNING  . Melatonin 10 MG TABS Take 1 tablet by mouth.  . Omega-3 Fatty Acids (FISH OIL) 1000 MG CAPS Take 1,000 mg by mouth daily.  . ranitidine (ZANTAC) 300 MG tablet Take 1 tablet (300 mg total) by mouth at bedtime.  . rosuvastatin (CRESTOR) 5 MG tablet Take 1 tablet (5 mg total) by mouth every other day.  Allergies: Niacin and related  Social History   Socioeconomic History  . Marital status: Married    Spouse name: Not on file  . Number of children: Not on file  . Years of education: Not on file  . Highest education level: Not on file  Social Needs  . Financial resource strain: Not on file  . Food insecurity - worry: Not on file  . Food  insecurity - inability: Not on file  . Transportation needs - medical: Not on file  . Transportation needs - non-medical: Not on file  Occupational History  . Not on file  Tobacco Use  . Smoking status: Former Smoker    Types: Cigarettes, Cigars    Last attempt to quit: 1990    Years since quitting: 28.9  . Smokeless tobacco: Never Used  . Tobacco comment: Smoked afternoon cigar  Substance and Sexual Activity  . Alcohol use: No  . Drug use: No  . Sexual activity: Yes  Other Topics Concern  . Not on file  Social History Narrative  . Not on file    Family History  Problem Relation Age of Onset  . Hyperlipidemia Father   . Kidney disease Father   . Heart disease Father   . Congestive Heart Failure Mother   . Heart disease Mother   . Hyperlipidemia Daughter   . Hyperlipidemia Son   . Diabetes Son   . Anxiety disorder Daughter   . Depression Daughter   . Arthritis Daughter     Review of Systems: A 12-system review of systems was performed and was negative except as noted in the HPI.  --------------------------------------------------------------------------------------------------  Physical Exam: BP 118/80 (BP Location: Left Arm, Patient Position: Sitting, Cuff Size: Normal)   Pulse 63   Ht 5\' 8"  (1.727 m)   Wt 228 lb (103.4 kg)   BMI 34.67 kg/m   General:  Obese man, seated comfortably on the exam table. He is accompanied by his wife. HEENT: No conjunctival pallor or scleral icterus. Moist mucous membranes.  OP clear. Neck: Supple without lymphadenopathy, thyromegaly, JVD, or HJR. Lungs: Normal work of breathing. Clear to auscultation bilaterally without wheezes or crackles. Heart: Regular rate and rhythm without murmurs, rubs, or gallops. Non-displaced PMI. Abd: Bowel sounds present. Soft, NT/ND without hepatosplenomegaly Ext: Trace ankle edema, left greater than right. No significant pretibial edema. Skin: Warm and dry without rash.  EKG:  Normal sinus rhythm,  left axis deviation, and non-specific T-wave changes. No significant change since 10/20/16.  Lab Results  Component Value Date   WBC 5.9 02/09/2016   HGB 16.1 02/09/2016   HCT 48.2 02/09/2016   MCV 96.2 02/09/2016   PLT 187 02/09/2016    Lab Results  Component Value Date   NA 139 11/30/2016   K 4.3 11/30/2016   CL 104 11/30/2016   CO2 26 11/30/2016   BUN 11 11/30/2016   CREATININE 1.34 (H) 11/30/2016   GLUCOSE 144 (H) 11/30/2016   ALT 25 11/30/2016    Lab Results  Component Value Date   CHOL 121 11/30/2016   HDL 32 (L) 11/30/2016   LDLCALC 47 04/21/2016   TRIG 249 (H) 11/30/2016   CHOLHDL 3.8 11/30/2016    --------------------------------------------------------------------------------------------------  ASSESSMENT AND PLAN: Diastolic dysfunction and obstructive sleep apnea Shortness of breath has almost completely resolved with low-dose carvedilol and initiation of CPAP. I encouraged Drew [name] to speak with his CPAP supplier and/or Dr. Sung AmabileSimonds if he continues to have problems with his CPAP mask fitting properly.  We will continue carvedilol 3.125 mg BID.  Hypertension Blood pressure normal. Given intermittent orthostatic lightheadedness, we have agreed to discontinue HCTZ, which Drew [name] has only been taking 3x/week.  Follow-up: Return to clinic in 6 months.  Yvonne Kendallhristopher Katey Barrie, MD 02/09/2017 10:52 AM

## 2017-02-09 ENCOUNTER — Ambulatory Visit: Payer: Medicare Other | Admitting: Internal Medicine

## 2017-02-09 ENCOUNTER — Encounter: Payer: Self-pay | Admitting: Internal Medicine

## 2017-02-09 VITALS — BP 118/80 | HR 63 | Ht 68.0 in | Wt 228.0 lb

## 2017-02-09 DIAGNOSIS — I519 Heart disease, unspecified: Secondary | ICD-10-CM | POA: Diagnosis not present

## 2017-02-09 DIAGNOSIS — I10 Essential (primary) hypertension: Secondary | ICD-10-CM | POA: Diagnosis not present

## 2017-02-09 DIAGNOSIS — G4733 Obstructive sleep apnea (adult) (pediatric): Secondary | ICD-10-CM | POA: Diagnosis not present

## 2017-02-09 DIAGNOSIS — I5189 Other ill-defined heart diseases: Secondary | ICD-10-CM

## 2017-02-09 NOTE — Patient Instructions (Signed)
Medication Instructions:  Your physician has recommended you make the following change in your medication:  1- STOP taking Hydrochlorothiazide.   Labwork: none  Testing/Procedures: none  Follow-Up: Your physician wants you to follow-up in: 6 MONTHS WITH DR END. You will receive a reminder letter in the mail two months in advance. If you don't receive a letter, please call our office to schedule the follow-up appointment.   Any Other Special Instructions Will Be Listed Below (If Applicable).  Please monitor your blood pressure at home. Call us if it is consistently over 140/90.   If you need a refill on your cardiac medications before your next appointment, please call your pharmacy.

## 2017-02-10 ENCOUNTER — Encounter: Payer: Self-pay | Admitting: Internal Medicine

## 2017-02-10 DIAGNOSIS — I5189 Other ill-defined heart diseases: Secondary | ICD-10-CM | POA: Insufficient documentation

## 2017-02-10 DIAGNOSIS — G4733 Obstructive sleep apnea (adult) (pediatric): Secondary | ICD-10-CM | POA: Insufficient documentation

## 2017-02-18 ENCOUNTER — Other Ambulatory Visit: Payer: Self-pay | Admitting: Internal Medicine

## 2017-03-12 DIAGNOSIS — I10 Essential (primary) hypertension: Secondary | ICD-10-CM | POA: Diagnosis not present

## 2017-03-12 DIAGNOSIS — G4733 Obstructive sleep apnea (adult) (pediatric): Secondary | ICD-10-CM | POA: Diagnosis not present

## 2017-03-16 DIAGNOSIS — I10 Essential (primary) hypertension: Secondary | ICD-10-CM | POA: Diagnosis not present

## 2017-03-16 DIAGNOSIS — G4733 Obstructive sleep apnea (adult) (pediatric): Secondary | ICD-10-CM | POA: Diagnosis not present

## 2017-03-24 ENCOUNTER — Telehealth: Payer: Self-pay | Admitting: Pulmonary Disease

## 2017-03-24 NOTE — Telephone Encounter (Signed)
Pt spouse calling She would like to know with all the equipment that patient has for his breathing   They just want to know if insurance is going to cover this They received it on 01/10/17  Please advise

## 2017-03-24 NOTE — Telephone Encounter (Signed)
Called patient's spouse back to answer questions. Explained if insurance was not going to cover they would have been notified. Asked if he had cpap compliance visit? NO Appt scheduled 03/29/17. Nothing further needed.

## 2017-03-28 ENCOUNTER — Other Ambulatory Visit: Payer: Self-pay | Admitting: Family Medicine

## 2017-03-28 ENCOUNTER — Encounter: Payer: Self-pay | Admitting: Internal Medicine

## 2017-03-28 DIAGNOSIS — M1A0610 Idiopathic chronic gout, right knee, without tophus (tophi): Secondary | ICD-10-CM

## 2017-03-28 NOTE — Progress Notes (Signed)
PULMONARY/SLEEP CONSULT NOTE  Requesting MD/Service: Yvonne Kendallhristopher End, MD Date of initial consultation: 08/31 Reason for consultation: Snoring, obesity, suspected OSA  PT PROFILE: 77 y.o. male former smoker referred for evaluation of fatigue and daytime sleepiness  DATA: 03/17/16 echocardiogram: Grade 1 diastolic dysfunction. Otherwise normal.  HPI:  The patient is a 77 year old male, he previously saw Dr. Sung AmabileSimonds with complaints of excessive daytime sleepiness, he was sent for a sleep study which showed obstructive sleep apnea which was severe.  He feels that he is doing better with the PAP, he occasionally wakes but is able to go back to sleep. He is no longer snoring, and feels that he is more awake during the day. He rinses supplies daily.   Review of download data, 30 days as of 03/28/17, usage greater than 4 hours is 30/30 days.  Average usage is 9 hours 16 minutes.  Pressure setting is 5-20.  Median pressure is 7.6, 95th percentile is 11, maximum pressure is 13.  Residual AHI is 2.8.   ROS: No fever, myalgias/arthralgias, unexplained weight loss or weight gain No new focal weakness or sensory deficits No otalgia, hearing loss, visual changes, nasal and sinus symptoms, mouth and throat problems No neck pain or adenopathy No abdominal pain, N/V/D, diarrhea, change in bowel pattern No dysuria, change in urinary pattern   Vitals:   03/29/17 1510 03/29/17 1520  BP:  138/88  Pulse:  73  Resp: 16   SpO2:  93%  Weight: 233 lb (105.7 kg)   Height: 5\' 8"  (1.727 m)      EXAM:  Gen: WDWN, No overt respiratory distress HEENT: NCAT, sclera white, oropharynx normal, nares patent with normal nasal mucosa Neck: Supple without LAN, thyromegaly, JVD Lungs: breath sounds full without wheezes or other adventitious sounds Cardiovascular: RRR, no murmurs noted Abdomen: Soft, nontender, normal BS Ext: without clubbing, cyanosis, edema Neuro: CNs grossly intact, motor and sensory  intact Skin: Limited exam, no lesions noted  DATA:   BMP Latest Ref Rng & Units 11/30/2016 02/09/2016 10/20/2015  Glucose 65 - 99 mg/dL 696(E144(H) 952(W134(H) 413(K125(H)  BUN 7 - 25 mg/dL 11 18 16   Creatinine 0.70 - 1.18 mg/dL 4.40(N1.34(H) 0.27(O1.57(H) 5.36(U1.52(H)  BUN/Creat Ratio 6 - 22 (calc) 8 - -  Sodium 135 - 146 mmol/L 139 140 142  Potassium 3.5 - 5.3 mmol/L 4.3 4.8 4.3  Chloride 98 - 110 mmol/L 104 105 105  CO2 20 - 32 mmol/L 26 21 25   Calcium 8.6 - 10.3 mg/dL 9.7 44.010.0 9.9    CBC Latest Ref Rng & Units 02/09/2016 12/28/2013 12/28/2013  WBC 3.8 - 10.8 K/uL 5.9 - 7.1  Hemoglobin 13.2 - 17.1 g/dL 34.716.1 4.2(V8.8(L) 9.5(G8.7(L)  Hematocrit 38.5 - 50.0 % 48.2 26.5(L) 26.1(L)  Platelets 140 - 400 K/uL 187 - 143(L)    CXR:  No recent  IMPRESSION:   No diagnosis found.  HST 11/11/16; AHI equals 39.5 consistent with severe obstructive sleep apnea.   Now doing well with CPAP.   PLAN:  Continue Auto-CPAP nightly.    03/29/2017 3:30 PM

## 2017-03-29 ENCOUNTER — Ambulatory Visit: Payer: Self-pay | Admitting: Internal Medicine

## 2017-03-29 ENCOUNTER — Encounter: Payer: Self-pay | Admitting: Internal Medicine

## 2017-03-29 VITALS — BP 138/88 | HR 73 | Resp 16 | Ht 68.0 in | Wt 233.0 lb

## 2017-03-29 DIAGNOSIS — G4733 Obstructive sleep apnea (adult) (pediatric): Secondary | ICD-10-CM

## 2017-03-29 DIAGNOSIS — R0683 Snoring: Secondary | ICD-10-CM

## 2017-03-29 NOTE — Patient Instructions (Signed)
Continue using your cpap every night.  

## 2017-04-07 DIAGNOSIS — N183 Chronic kidney disease, stage 3 (moderate): Secondary | ICD-10-CM | POA: Diagnosis not present

## 2017-04-07 DIAGNOSIS — N281 Cyst of kidney, acquired: Secondary | ICD-10-CM | POA: Diagnosis not present

## 2017-04-07 DIAGNOSIS — R809 Proteinuria, unspecified: Secondary | ICD-10-CM | POA: Diagnosis not present

## 2017-04-07 DIAGNOSIS — R600 Localized edema: Secondary | ICD-10-CM | POA: Diagnosis not present

## 2017-04-07 DIAGNOSIS — E1129 Type 2 diabetes mellitus with other diabetic kidney complication: Secondary | ICD-10-CM | POA: Diagnosis not present

## 2017-04-08 ENCOUNTER — Other Ambulatory Visit: Payer: Self-pay | Admitting: Family Medicine

## 2017-04-08 ENCOUNTER — Other Ambulatory Visit: Payer: Self-pay | Admitting: Physician Assistant

## 2017-04-08 DIAGNOSIS — E034 Atrophy of thyroid (acquired): Secondary | ICD-10-CM

## 2017-04-08 NOTE — Telephone Encounter (Signed)
Lab Results  Component Value Date   TSH 2.08 11/30/2016

## 2017-04-09 NOTE — Progress Notes (Signed)
Closing old note

## 2017-04-09 NOTE — Progress Notes (Signed)
Closing out lab/order note open since:  July 2018 

## 2017-04-09 NOTE — Progress Notes (Signed)
Closing out scanned document note open since  2018 

## 2017-04-09 NOTE — Progress Notes (Signed)
Closing out old abstraction note 

## 2017-04-09 NOTE — Progress Notes (Signed)
Closing out old note 

## 2017-04-09 NOTE — Progress Notes (Signed)
Closing out lab/order note open since:  Feb 2018 

## 2017-04-12 DIAGNOSIS — G4733 Obstructive sleep apnea (adult) (pediatric): Secondary | ICD-10-CM | POA: Diagnosis not present

## 2017-04-12 DIAGNOSIS — I10 Essential (primary) hypertension: Secondary | ICD-10-CM | POA: Diagnosis not present

## 2017-04-21 ENCOUNTER — Other Ambulatory Visit: Payer: Self-pay | Admitting: Nephrology

## 2017-04-21 DIAGNOSIS — Q6102 Congenital multiple renal cysts: Secondary | ICD-10-CM

## 2017-04-21 DIAGNOSIS — N261 Atrophy of kidney (terminal): Secondary | ICD-10-CM

## 2017-04-26 ENCOUNTER — Ambulatory Visit
Admission: RE | Admit: 2017-04-26 | Discharge: 2017-04-26 | Disposition: A | Discharge status: Home / Self Care | Payer: Medicare Other | Source: Ambulatory Visit | Attending: Nephrology | Admitting: Nephrology

## 2017-04-26 DIAGNOSIS — N281 Cyst of kidney, acquired: Secondary | ICD-10-CM | POA: Diagnosis not present

## 2017-04-26 DIAGNOSIS — Q6102 Congenital multiple renal cysts: Secondary | ICD-10-CM

## 2017-04-26 DIAGNOSIS — N261 Atrophy of kidney (terminal): Secondary | ICD-10-CM

## 2017-05-10 DIAGNOSIS — I10 Essential (primary) hypertension: Secondary | ICD-10-CM | POA: Diagnosis not present

## 2017-05-10 DIAGNOSIS — G4733 Obstructive sleep apnea (adult) (pediatric): Secondary | ICD-10-CM | POA: Diagnosis not present

## 2017-05-23 ENCOUNTER — Other Ambulatory Visit: Payer: Self-pay | Admitting: Internal Medicine

## 2017-05-31 ENCOUNTER — Other Ambulatory Visit: Payer: Self-pay | Admitting: Family Medicine

## 2017-05-31 ENCOUNTER — Ambulatory Visit (INDEPENDENT_AMBULATORY_CARE_PROVIDER_SITE_OTHER): Payer: Medicare Other | Admitting: Family Medicine

## 2017-05-31 ENCOUNTER — Encounter: Payer: Self-pay | Admitting: Family Medicine

## 2017-05-31 VITALS — BP 132/78 | HR 65 | Temp 98.3°F | Resp 16 | Ht 68.0 in | Wt 234.8 lb

## 2017-05-31 DIAGNOSIS — M1A0710 Idiopathic chronic gout, right ankle and foot, without tophus (tophi): Secondary | ICD-10-CM | POA: Diagnosis not present

## 2017-05-31 DIAGNOSIS — E034 Atrophy of thyroid (acquired): Secondary | ICD-10-CM | POA: Diagnosis not present

## 2017-05-31 DIAGNOSIS — I129 Hypertensive chronic kidney disease with stage 1 through stage 4 chronic kidney disease, or unspecified chronic kidney disease: Secondary | ICD-10-CM | POA: Diagnosis not present

## 2017-05-31 DIAGNOSIS — M109 Gout, unspecified: Secondary | ICD-10-CM | POA: Diagnosis not present

## 2017-05-31 DIAGNOSIS — E785 Hyperlipidemia, unspecified: Secondary | ICD-10-CM | POA: Diagnosis not present

## 2017-05-31 DIAGNOSIS — I10 Essential (primary) hypertension: Secondary | ICD-10-CM

## 2017-05-31 DIAGNOSIS — E119 Type 2 diabetes mellitus without complications: Secondary | ICD-10-CM | POA: Diagnosis not present

## 2017-05-31 DIAGNOSIS — N183 Chronic kidney disease, stage 3 (moderate): Secondary | ICD-10-CM | POA: Diagnosis not present

## 2017-05-31 DIAGNOSIS — E669 Obesity, unspecified: Secondary | ICD-10-CM | POA: Diagnosis not present

## 2017-05-31 DIAGNOSIS — Z5181 Encounter for therapeutic drug level monitoring: Secondary | ICD-10-CM | POA: Diagnosis not present

## 2017-05-31 MED ORDER — FEBUXOSTAT 40 MG PO TABS: 40.0000 mg | ORAL_TABLET | Freq: Every day | ORAL | 0 refills | Status: DC

## 2017-05-31 NOTE — Progress Notes (Signed)
BP 132/78   Pulse 65   Temp 98.3 F (36.8 C) (Oral)   Resp 16   Ht 5\' 8"  (1.727 m)   Wt 234 lb 12.8 oz (106.5 kg)   SpO2 94%   BMI 35.70 kg/m    Subjective:    Patient ID: Drew [name], male    DOB: 07/09/1940, 77 y.o.   MRN: [id number]  HPI: Drew [name] is a 77 y.o. male  Chief Complaint  Patient presents with  . Follow-up    HPI Patient is here for f/u Seeing kidney doctor; had the scan and cysts are there but no bigger; seeing him every 4 months BP controlled; checking BP at home with wrist cuff and bicep cuff; changes color if high (red); not adding salt to food; wife has cut back on salt when cooking Gout; taking allopurinol 200 mg daily; had a flare a month ago, right foot; maybe gravy High cholesterol; on statin; some fatty meats, less than a few years ago Thyroid disease; weight gain; bowels are moving okay, 2x a day; good energy Lab Results  Component Value Date   TSH 2.08 11/30/2016  GERD; controlled on ranitidine; certain foods are triggers; no blood in the stool Obesity; has gained some weight over the winter; will start push mowing the yard Type 2 diabetes Lab Results  Component Value Date   HGBA1C 6.9 (H) 11/30/2016  neck issues; sleeps on two pillows; catch on the left when he turns his head; veterinary linament; no weakness or numbness in the arms; crick comes back every 3-4 days Stage 3 CKD; avoiding NSAID; urinating fine  Depression screen [facility] 2/9 05/31/2017 11/30/2016 08/27/2016 02/09/2016 11/24/2015  Decreased Interest 0 0 0 0 0  Down, Depressed, Hopeless 0 0 0 1 0  PHQ - 2 Score 0 0 0 1 0   Relevant past medical, surgical, family and social history reviewed Past Medical History:  Diagnosis Date  . Chronic kidney disease, stage III (moderate) (HCC) 08/15/2015  . Diabetes mellitus with complication (HCC)   . GERD (gastroesophageal reflux disease)   . H/O: upper GI bleed 2015  . Hypercholesteremia   . Hypertension   . Obesity (BMI 30-39.9)  08/07/2015  . Peripheral vascular disease (HCC) 12/26/2015   Past Surgical History:  Procedure Laterality Date  . COLONOSCOPY N/A 12/27/2013   Procedure: COLONOSCOPY;  Surgeon: Barrie Folk, MD;  Location: [facility] ENDOSCOPY;  Service: Endoscopy;  Laterality: N/A;  . ENTEROSCOPY N/A 12/21/2013   Procedure: ENTEROSCOPY;  Surgeon: Vertell Novak., MD;  Location: [facility] ENDOSCOPY;  Service: Endoscopy;  Laterality: N/A;  . ENTEROSCOPY N/A 12/27/2013   Procedure: ENTEROSCOPY;  Surgeon: Barrie Folk, MD;  Location: [facility] ENDOSCOPY;  Service: Endoscopy;  Laterality: N/A;   Family History  Problem Relation Age of Onset  . Hyperlipidemia Father   . Kidney disease Father   . Heart disease Father   . Congestive Heart Failure Mother   . Heart disease Mother   . Hyperlipidemia Daughter   . Hyperlipidemia Son   . Diabetes Son   . Anxiety disorder Daughter   . Depression Daughter   . Arthritis Daughter    Social History   Tobacco Use  . Smoking status: Former Smoker    Types: Cigarettes, Cigars    Last attempt to quit: 1990    Years since quitting: 29.2  . Smokeless tobacco: Never Used  . Tobacco comment: Smoked afternoon cigar  Substance Use Topics  . Alcohol use: No  .  Drug use: No   Interim medical history since last visit reviewed. Allergies and medications reviewed  Review of Systems Per HPI unless specifically indicated above     Objective:    BP 132/78   Pulse 65   Temp 98.3 F (36.8 C) (Oral)   Resp 16   Ht 5\' 8"  (1.727 m)   Wt 234 lb 12.8 oz (106.5 kg)   SpO2 94%   BMI 35.70 kg/m   Wt Readings from Last 3 Encounters:  05/31/17 234 lb 12.8 oz (106.5 kg)  03/29/17 233 lb (105.7 kg)  02/09/17 228 lb (103.4 kg)    Physical Exam  Constitutional: He appears well-developed and well-nourished. No distress.  HENT:  Head: Normocephalic and atraumatic.  Eyes: EOM are normal. No scleral icterus.  Neck: No thyromegaly present.  Cardiovascular: Normal rate and regular rhythm.    Pulmonary/Chest: Effort normal and breath sounds normal.  Abdominal: Soft. Bowel sounds are normal. He exhibits no distension.  Musculoskeletal: He exhibits no edema.  Neurological: He is alert.  Skin: Skin is warm and dry. No pallor.  Psychiatric: He has a normal mood and affect. His behavior is normal. Judgment and thought content normal.   Diabetic Foot Form - Detailed   Diabetic Foot Exam - detailed Diabetic Foot exam was performed with the following findings:  Yes 05/31/2017  9:09 AM  Visual Foot Exam completed.:  Yes  Pulse Foot Exam completed.:  Yes  Right Dorsalis Pedis:  Present Left Dorsalis Pedis:  Present  Sensory Foot Exam Completed.:  Yes Semmes-Weinstein Monofilament Test R Site 1-Great Toe:  Pos L Site 1-Great Toe:  Pos       Results for orders placed or performed in visit on 11/30/16  COMPLETE METABOLIC PANEL WITH GFR  Result Value Ref Range   Glucose, Bld 144 (H) 65 - 99 mg/dL   BUN 11 7 - 25 mg/dL   Creat 1.611.34 (H) 0.960.70 - 1.18 mg/dL   GFR, Est Non African American 51 (L) > OR = 60 mL/min/1.6673m2   GFR, Est African American 59 (L) > OR = 60 mL/min/1.7673m2   BUN/Creatinine Ratio 8 6 - 22 (calc)   Sodium 139 135 - 146 mmol/L   Potassium 4.3 3.5 - 5.3 mmol/L   Chloride 104 98 - 110 mmol/L   CO2 26 20 - 32 mmol/L   Calcium 9.7 8.6 - 10.3 mg/dL   Total Protein 7.0 6.1 - 8.1 g/dL   Albumin 4.6 3.6 - 5.1 g/dL   Globulin 2.4 1.9 - 3.7 g/dL (calc)   AG Ratio 1.9 1.0 - 2.5 (calc)   Total Bilirubin 0.9 0.2 - 1.2 mg/dL   Alkaline phosphatase (APISO) 54 40 - 115 U/L   AST 22 10 - 35 U/L   ALT 25 9 - 46 U/L  Hemoglobin A1c  Result Value Ref Range   Hgb A1c MFr Bld 6.9 (H) <5.7 % of total Hgb   Mean Plasma Glucose 151 (calc)   eAG (mmol/L) 8.4 (calc)  Lipid panel  Result Value Ref Range   Cholesterol 121 <200 mg/dL   HDL 32 (L) >04>40 mg/dL   Triglycerides 540249 (H) <150 mg/dL   LDL Cholesterol (Calc) 60 mg/dL (calc)   Total CHOL/HDL Ratio 3.8 <5.0 (calc)   Non-HDL  Cholesterol (Calc) 89 <130 mg/dL (calc)  Uric acid  Result Value Ref Range   Uric Acid, Serum 7.3 4.0 - 8.0 mg/dL  TSH  Result Value Ref Range   TSH 2.08 0.40 -  4.50 mIU/L      Assessment & Plan:   Problem List Items Addressed This Visit      Cardiovascular and Mediastinum   Essential hypertension (Chronic)    Well-controlled today; continue medicine; work on weight loss, DASH guidelines      Relevant Medications   hydrochlorothiazide (MICROZIDE) 12.5 MG capsule     Endocrine   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)   Relevant Orders   TSH   Diabetes mellitus type 2, diet-controlled (HCC) (Chronic)    Check a1c; work on weight loss      Relevant Orders   Hemoglobin A1c   Microalbumin / creatinine urine ratio     Musculoskeletal and Integument   Chronic gout of ankle    Limit gravy, organ meats        Genitourinary   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 Sherie Don, try to lose 10 pounds before then.  An after-visit summary was printed and given to the patient at check-out.  Please see the patient instructions which may contain other information and recommendations beyond what is mentioned above in the assessment and plan.  No orders of the defined types were placed in this encounter.   Orders  Placed This Encounter  Procedures  . Lipid panel  . Hemoglobin A1c  . COMPLETE METABOLIC PANEL WITH GFR  . Microalbumin / creatinine urine ratio  . TSH  . Uric acid

## 2017-05-31 NOTE — Assessment & Plan Note (Signed)
Avoid NSAIDs 

## 2017-05-31 NOTE — Assessment & Plan Note (Signed)
Cut back on portions, be a little more active, work up slow

## 2017-05-31 NOTE — Assessment & Plan Note (Signed)
Well-controlled today; continue medicine; work on weight loss, DASH guidelines

## 2017-05-31 NOTE — Assessment & Plan Note (Signed)
Check uric acid; avoid gravies

## 2017-05-31 NOTE — Progress Notes (Signed)
STOP allopurinol START uloric, recheck labs in one month

## 2017-05-31 NOTE — Assessment & Plan Note (Signed)
Check lipids 

## 2017-05-31 NOTE — Patient Instructions (Addendum)
Check out the information at familydoctor.org entitled "Nutrition for Weight Loss: What You Need to Know about Fad Diets" Try to lose between 1-2 pounds per week by taking in fewer calories and burning off more calories You can succeed by limiting portions, limiting foods dense in calories and fat, becoming more active, and drinking 8 glasses of water a day (64 ounces) Don't skip meals, especially breakfast, as skipping meals may alter your metabolism Do not use over-the-counter weight loss pills or gimmicks that claim rapid weight loss A healthy BMI (or body mass index) is between 18.5 and 24.9 You can calculate your ideal BMI at the NIH website [employer].huhttp://www.nhlbi.nih.gov/health/educational/lose_wt/BMI/bmicalc.htm Try to follow the DASH guidelines (DASH stands for Dietary Approaches to Stop Hypertension). Try to limit the sodium in your diet to no more than 1,500mg  of sodium per day. Certainly try to not exceed 2,000 mg per day at the very most. Do not add salt when cooking or at the table.  Check the sodium amount on labels when shopping, and choose items lower in sodium when given a choice. Avoid or limit foods that already contain a lot of sodium. Eat a diet rich in fruits and vegetables and whole grains, and try to lose weight if overweight or obese Let's get labs If you have not heard anything from my staff in a week about any orders/referrals/studies from today, please contact us here to follow-up [phone]

## 2017-05-31 NOTE — Assessment & Plan Note (Signed)
Check a1c; work on weight loss

## 2017-05-31 NOTE — Assessment & Plan Note (Signed)
Check liver and kidneys 

## 2017-05-31 NOTE — Assessment & Plan Note (Signed)
Limit gravy, organ meats

## 2017-06-01 ENCOUNTER — Telehealth: Payer: Self-pay

## 2017-06-01 ENCOUNTER — Other Ambulatory Visit: Payer: Self-pay | Admitting: Family Medicine

## 2017-06-01 DIAGNOSIS — Z5181 Encounter for therapeutic drug level monitoring: Secondary | ICD-10-CM

## 2017-06-01 DIAGNOSIS — M109 Gout, unspecified: Secondary | ICD-10-CM

## 2017-06-01 LAB — MICROALBUMIN / CREATININE URINE RATIO
CREATININE, URINE: 76 mg/dL (ref 20–320)
MICROALB UR: 3.3 mg/dL
Microalb Creat Ratio: 43 mcg/mg creat — ABNORMAL HIGH (ref <30)

## 2017-06-01 LAB — COMPLETE METABOLIC PANEL WITH GFR
AG RATIO: 2.4 (calc) (ref 1.0–2.5)
ALKALINE PHOSPHATASE (APISO): 55 U/L (ref 40–115)
ALT: 33 U/L (ref 9–46)
AST: 27 U/L (ref 10–35)
Albumin: 4.7 g/dL (ref 3.6–5.1)
BILIRUBIN TOTAL: 0.8 mg/dL (ref 0.2–1.2)
BUN/Creatinine Ratio: 10 (calc) (ref 6–22)
BUN: 13 mg/dL (ref 7–25)
CHLORIDE: 104 mmol/L (ref 98–110)
CO2: 25 mmol/L (ref 20–32)
Calcium: 9.7 mg/dL (ref 8.6–10.3)
Creat: 1.27 mg/dL — ABNORMAL HIGH (ref 0.70–1.18)
GFR, EST AFRICAN AMERICAN: 63 mL/min/{1.73_m2} (ref >=60)
GFR, Est Non African American: 54 mL/min/{1.73_m2} — ABNORMAL LOW (ref >=60)
GLUCOSE: 177 mg/dL — AB (ref 65–99)
Globulin: 2 g/dL (calc) (ref 1.9–3.7)
POTASSIUM: 4.5 mmol/L (ref 3.5–5.3)
Sodium: 138 mmol/L (ref 135–146)
TOTAL PROTEIN: 6.7 g/dL (ref 6.1–8.1)

## 2017-06-01 LAB — LIPID PANEL
CHOL/HDL RATIO: 5.2 (calc) — AB (ref <5.0)
Cholesterol: 167 mg/dL (ref <200)
HDL: 32 mg/dL — AB (ref >40)
LDL Cholesterol (Calc): 88 mg/dL (calc)
NON-HDL CHOLESTEROL (CALC): 135 mg/dL — AB (ref <130)
TRIGLYCERIDES: 353 mg/dL — AB (ref <150)

## 2017-06-01 LAB — HEMOGLOBIN A1C
EAG (MMOL/L): 9.8 (calc)
Hgb A1c MFr Bld: 7.8 % of total Hgb — ABNORMAL HIGH (ref <5.7)
MEAN PLASMA GLUCOSE: 177 (calc)

## 2017-06-01 LAB — TSH: TSH: 2.2 m[IU]/L (ref 0.40–4.50)

## 2017-06-01 LAB — URIC ACID: URIC ACID, SERUM: 8.7 mg/dL — AB (ref 4.0–8.0)

## 2017-06-01 MED ORDER — LIRAGLUTIDE 18 MG/3ML ~~LOC~~ SOPN: PEN_INJECTOR | SUBCUTANEOUS | 0 refills | Status: DC

## 2017-06-01 MED ORDER — METFORMIN HCL ER 500 MG PO TB24: ORAL_TABLET | ORAL | 0 refills | Status: DC

## 2017-06-01 NOTE — Telephone Encounter (Signed)
Called [facility] cancelled rx for Metformin per Dr.Lada.    Called pt LM for him informing him of lab results. CRM created. [facility].

## 2017-06-01 NOTE — Progress Notes (Signed)
Will not use metformin since his GFR is teetering around 50 and sometimes less

## 2017-06-01 NOTE — Telephone Encounter (Signed)
-----   Message from Kerman PasseyMelinda P Lada, MD sent at 05/31/2017  5:25 PM EDT ----- Idamae Schullerkie, please let pt know that his uric acid has really gone up; it was 4.7 a year ago, then 7.3 six months ago, and now it's 8.7; I'd like to change his gout medicine; STOP allopurinol and start Uloric; please fax these results to his kidney doctor, as patient is on HCTZ three days a week; not sure if that might be something he would consider stopping Recheck BMP and uric acid in one month (please order, medication monitoring and gout) HDL is low and TG are too high; weight loss and healthy eating are the key; I'll encourage him to really watch his diet and double up on his fish oil to twice a day now His kidney function has improved, and his GFR is now 63; liver enzymes are normal; thank you

## 2017-06-07 ENCOUNTER — Other Ambulatory Visit: Payer: Self-pay

## 2017-06-07 ENCOUNTER — Telehealth: Payer: Self-pay | Admitting: Family Medicine

## 2017-06-07 NOTE — Telephone Encounter (Signed)
Spoke with pt, med is actually going to be too expensive. Asked pt to call mail order and try to send back.

## 2017-06-07 NOTE — Telephone Encounter (Signed)
Copied from CRM [phone]. Topic: Quick Communication - See Telephone Encounter >> Jun 07, 2017  8:54 AM Jolayne Hainesaylor, Brittany L wrote: CRM for notification. See Telephone encounter for: 06/07/17.  Patient's wife said that they read the papers for side effects on febuxostat (ULORIC) 40 MG tablet when they picked up the prescription. He has not started taking it yet & his wife does not want him to take it because she thinks it will kill him. Please call Lurena JoinerRebecca ( Wife ) [phone]

## 2017-06-08 DIAGNOSIS — I10 Essential (primary) hypertension: Secondary | ICD-10-CM | POA: Diagnosis not present

## 2017-06-08 DIAGNOSIS — G4733 Obstructive sleep apnea (adult) (pediatric): Secondary | ICD-10-CM | POA: Diagnosis not present

## 2017-06-10 ENCOUNTER — Other Ambulatory Visit: Payer: Self-pay | Admitting: Family Medicine

## 2017-06-10 DIAGNOSIS — I10 Essential (primary) hypertension: Secondary | ICD-10-CM | POA: Diagnosis not present

## 2017-06-10 DIAGNOSIS — G4733 Obstructive sleep apnea (adult) (pediatric): Secondary | ICD-10-CM | POA: Diagnosis not present

## 2017-06-10 MED ORDER — INSULIN PEN NEEDLE 31G X 5 MM MISC: 0 refills | Status: DC

## 2017-06-10 NOTE — Progress Notes (Signed)
Needles sent

## 2017-06-22 DIAGNOSIS — H2513 Age-related nuclear cataract, bilateral: Secondary | ICD-10-CM | POA: Diagnosis not present

## 2017-06-22 DIAGNOSIS — H5203 Hypermetropia, bilateral: Secondary | ICD-10-CM | POA: Diagnosis not present

## 2017-06-22 DIAGNOSIS — E119 Type 2 diabetes mellitus without complications: Secondary | ICD-10-CM | POA: Diagnosis not present

## 2017-07-04 ENCOUNTER — Other Ambulatory Visit: Payer: Self-pay

## 2017-07-04 ENCOUNTER — Other Ambulatory Visit: Payer: Self-pay | Admitting: Family Medicine

## 2017-07-04 DIAGNOSIS — Z5181 Encounter for therapeutic drug level monitoring: Secondary | ICD-10-CM | POA: Diagnosis not present

## 2017-07-04 DIAGNOSIS — M109 Gout, unspecified: Secondary | ICD-10-CM | POA: Diagnosis not present

## 2017-07-04 LAB — BASIC METABOLIC PANEL
BUN/Creatinine Ratio: 10 (calc) (ref 6–22)
BUN: 13 mg/dL (ref 7–25)
CO2: 26 mmol/L (ref 20–32)
Calcium: 9.8 mg/dL (ref 8.6–10.3)
Chloride: 104 mmol/L (ref 98–110)
Creat: 1.32 mg/dL — ABNORMAL HIGH (ref 0.70–1.18)
Glucose, Bld: 90 mg/dL (ref 65–99)
POTASSIUM: 4.6 mmol/L (ref 3.5–5.3)
Sodium: 140 mmol/L (ref 135–146)

## 2017-07-04 LAB — URIC ACID: URIC ACID, SERUM: 5.7 mg/dL (ref 4.0–8.0)

## 2017-07-04 MED ORDER — FEBUXOSTAT 40 MG PO TABS: 40.0000 mg | ORAL_TABLET | Freq: Every day | ORAL | 1 refills | Status: DC

## 2017-07-04 NOTE — Telephone Encounter (Signed)
Requests were sent directly to [place] from local pharmacy; requests sent back approved

## 2017-07-04 NOTE — Progress Notes (Signed)
Uric acid much improved; continue uloric

## 2017-07-05 MED ORDER — LIRAGLUTIDE 18 MG/3ML ~~LOC~~ SOPN: 1.8000 mg | PEN_INJECTOR | Freq: Every day | SUBCUTANEOUS | 1 refills | Status: DC

## 2017-07-05 MED ORDER — INSULIN PEN NEEDLE 31G X 5 MM MISC: 0 refills | Status: DC

## 2017-07-10 DIAGNOSIS — I10 Essential (primary) hypertension: Secondary | ICD-10-CM | POA: Diagnosis not present

## 2017-07-10 DIAGNOSIS — G4733 Obstructive sleep apnea (adult) (pediatric): Secondary | ICD-10-CM | POA: Diagnosis not present

## 2017-07-11 DIAGNOSIS — G4733 Obstructive sleep apnea (adult) (pediatric): Secondary | ICD-10-CM | POA: Diagnosis not present

## 2017-07-11 DIAGNOSIS — I10 Essential (primary) hypertension: Secondary | ICD-10-CM | POA: Diagnosis not present

## 2017-08-03 ENCOUNTER — Telehealth: Payer: Self-pay | Admitting: Family Medicine

## 2017-08-03 NOTE — Telephone Encounter (Signed)
So with his kidney function, I personally would not recommend anything else Ask them to contact his kidney doctor and see if they might have samples or can recommend something else Thank you

## 2017-08-03 NOTE — Telephone Encounter (Signed)
Copied from CRM [id number]. Topic: Quick Communication - See Telephone Encounter >> Aug 03, 2017  2:23 PM Tamela OddiMartin, Don'Quashia, NT wrote: CRM for notification. See Telephone encounter for: 08/03/17. Patient wife called and states that a medication that Dr. Sherie DonLada prescribed cost too much febuxostat (ULORIC) 40 MG tablet  patient wife states it cost $247.00. Pt is wondering if she can prescribed something else or similar to this medication . Please advise  [facility] [place] - [id number] [phone] (Phone) [phone] (Fax)

## 2017-08-05 NOTE — Telephone Encounter (Signed)
Patient came to the office on today just to discuss the uloric medication.  He was informed that Dr. Sherie DonLada would not recommend anything else based on his current kidney functions, but he can check with his kidney specialist to see if they have any samples or would recommend another medication.  Patient verbalized understanding.

## 2017-08-10 ENCOUNTER — Other Ambulatory Visit: Payer: Self-pay | Admitting: Internal Medicine

## 2017-08-10 DIAGNOSIS — G4733 Obstructive sleep apnea (adult) (pediatric): Secondary | ICD-10-CM | POA: Diagnosis not present

## 2017-08-10 DIAGNOSIS — I10 Essential (primary) hypertension: Secondary | ICD-10-CM | POA: Diagnosis not present

## 2017-08-12 ENCOUNTER — Other Ambulatory Visit: Payer: Self-pay | Admitting: Internal Medicine

## 2017-08-12 ENCOUNTER — Other Ambulatory Visit: Payer: Self-pay | Admitting: *Deleted

## 2017-08-12 MED ORDER — CARVEDILOL 3.125 MG PO TABS: 3.1250 mg | ORAL_TABLET | Freq: Two times a day (BID) | ORAL | 0 refills | Status: DC

## 2017-08-30 ENCOUNTER — Ambulatory Visit (INDEPENDENT_AMBULATORY_CARE_PROVIDER_SITE_OTHER): Payer: Medicare Other | Admitting: Family Medicine

## 2017-08-30 ENCOUNTER — Encounter: Payer: Self-pay | Admitting: Family Medicine

## 2017-08-30 VITALS — BP 130/78 | HR 75 | Temp 98.2°F | Resp 12 | Ht 68.0 in | Wt 226.4 lb

## 2017-08-30 DIAGNOSIS — N183 Chronic kidney disease, stage 3 unspecified: Secondary | ICD-10-CM

## 2017-08-30 DIAGNOSIS — Z8719 Personal history of other diseases of the digestive system: Secondary | ICD-10-CM | POA: Diagnosis not present

## 2017-08-30 DIAGNOSIS — E785 Hyperlipidemia, unspecified: Secondary | ICD-10-CM | POA: Diagnosis not present

## 2017-08-30 DIAGNOSIS — Z862 Personal history of diseases of the blood and blood-forming organs and certain disorders involving the immune mechanism: Secondary | ICD-10-CM

## 2017-08-30 DIAGNOSIS — I129 Hypertensive chronic kidney disease with stage 1 through stage 4 chronic kidney disease, or unspecified chronic kidney disease: Secondary | ICD-10-CM

## 2017-08-30 DIAGNOSIS — E119 Type 2 diabetes mellitus without complications: Secondary | ICD-10-CM

## 2017-08-30 DIAGNOSIS — I10 Essential (primary) hypertension: Secondary | ICD-10-CM

## 2017-08-30 DIAGNOSIS — M1A0710 Idiopathic chronic gout, right ankle and foot, without tophus (tophi): Secondary | ICD-10-CM | POA: Diagnosis not present

## 2017-08-30 DIAGNOSIS — E669 Obesity, unspecified: Secondary | ICD-10-CM | POA: Diagnosis not present

## 2017-08-30 NOTE — Assessment & Plan Note (Signed)
Praise given for weight loss; he is cutting back

## 2017-08-30 NOTE — Assessment & Plan Note (Signed)
Avoiding NSAIDs; stay hydrated; avoid salt

## 2017-08-30 NOTE — Assessment & Plan Note (Signed)
Check CBC to make sure no blood loss on aspirin

## 2017-08-30 NOTE — Progress Notes (Signed)
BP 130/78   Pulse 75   Temp 98.2 F (36.8 C) (Oral)   Resp 12   Ht 5\' 8"  (1.727 m)   Wt 226 lb 6.4 oz (102.7 kg)   SpO2 95%   BMI 34.42 kg/m    Subjective:    Patient ID: Drew [name], male    DOB: 06-09-1940, 77 y.o.   MRN: [id number]  HPI: Drew [name] is a 77 y.o. male  Chief Complaint  Patient presents with  . Follow-up    HPI Patient is here for follow-up  Type 2 diabetes; no new problems with feet; checking sugars and highest in last 2 weeks was 120, lowest was 104; eating a little better Lab Results  Component Value Date   HGBA1C 7.8 (H) 05/31/2017   Chronic kidney disease; seeing kidney doctor soon; not taking NSAIDs; just tylenol if needed  Gout; last flare was 4 months ago; had to stop the uloric; back on allopurinol  Obesity; he is cutting back on portions  High cholesterol; he is tired of chicken wife says; not getting enough fruits and veggies; needs to change grocery stores; goes to [employer] and [employer]; likes fresh veggies Lab Results  Component Value Date   CHOL 167 05/31/2017   HDL 32 (L) 05/31/2017   LDLCALC 88 05/31/2017   TRIG 353 (H) 05/31/2017   CHOLHDL 5.2 (H) 05/31/2017   Lab Results  Component Value Date   TSH 2.20 05/31/2017  hypothyroidism; losing weight on purpose   Depression screen [facility] 2/9 08/30/2017 05/31/2017 11/30/2016 08/27/2016 02/09/2016  Decreased Interest 0 0 0 0 0  Down, Depressed, Hopeless 0 0 0 0 1  PHQ - 2 Score 0 0 0 0 1    Relevant past medical, surgical, family and social history reviewed Past Medical History:  Diagnosis Date  . Chronic kidney disease, stage III (moderate) (HCC) 08/15/2015  . Diabetes mellitus with complication (HCC)   . GERD (gastroesophageal reflux disease)   . H/O: upper GI bleed 2015  . Hypercholesteremia   . Hypertension   . Obesity (BMI 30-39.9) 08/07/2015  . Peripheral vascular disease (HCC) 12/26/2015   Past Surgical History:  Procedure Laterality Date  . COLONOSCOPY N/A 12/27/2013     Procedure: COLONOSCOPY;  Surgeon: Barrie Folk, MD;  Location[facility] END[facility]COPY;  Service: Endoscopy;  Laterality: N/A;  . ENTEROSCOPY N/A 12/21/2013   Procedure: ENTEROSCOPY;  Surgeon: Vertell Novak., MD;  Location: [facility] ENDOSCOPY;  Service: Endoscopy;  Laterality: N/A;  . ENTEROSCOPY N/A 12/27/2013   Procedure: ENTEROSCOPY;  Surgeon: Barrie Folk, MD;  Location: [facility] ENDOSCOPY;  Service: Endoscopy;  Laterality: N/A;   Family History  Problem Relation Age of Onset  . Hyperlipidemia Father   . Kidney disease Father   . Heart disease Father   . Congestive Heart Failure Mother   . Heart disease Mother   . Hyperlipidemia Daughter   . Hyperlipidemia Son   . Diabetes Son   . Anxiety disorder Daughter   . Depression Daughter   . Arthritis Daughter    Social History   Tobacco Use  . Smoking status: Former Smoker    Types: Cigarettes, Cigars    Last attempt to quit: 1990    Years since quitting: 29.5  . Smokeless tobacco: Never Used  . Tobacco comment: Smoked afternoon cigar  Substance Use Topics  . Alcohol use: No  . Drug use: No    Interim medical history since last visit reviewed. Allergies and medications  reviewed  Review of Systems Per HPI unless specifically indicated above     Objective:    BP 130/78   Pulse 75   Temp 98.2 F (36.8 C) (Oral)   Resp 12   Ht 5\' 8"  (1.727 m)   Wt 226 lb 6.4 oz (102.7 kg)   SpO2 95%   BMI 34.42 kg/m   Wt Readings from Last 3 Encounters:  08/30/17 226 lb 6.4 oz (102.7 kg)  05/31/17 234 lb 12.8 oz (106.5 kg)  03/29/17 233 lb (105.7 kg)    Physical Exam  Constitutional: He appears well-developed and well-nourished. No distress.  HENT:  Head: Normocephalic and atraumatic.  Eyes: EOM are normal. No scleral icterus.  Neck: No thyromegaly present.  Cardiovascular: Normal rate and regular rhythm.  Pulmonary/Chest: Effort normal and breath sounds normal.  Abdominal: Soft. Bowel sounds are normal. He exhibits no distension.   Musculoskeletal: He exhibits no edema.  Neurological: Coordination normal.  Skin: Skin is warm and dry. No pallor.  Psychiatric: He has a normal mood and affect. His behavior is normal. Judgment and thought content normal.   Diabetic Foot Form - Detailed   Diabetic Foot Exam - detailed Diabetic Foot exam was performed with the following findings:  Yes 08/30/2017  9:02 AM  Visual Foot Exam completed.:  Yes  Pulse Foot Exam completed.:  Yes  Right Dorsalis Pedis:  Present Left Dorsalis Pedis:  Present  Sensory Foot Exam Completed.:  Yes Semmes-Weinstein Monofilament Test R Site 1-Great Toe:  Pos L Site 1-Great Toe:  Pos        Results for orders placed or performed in visit on 07/04/17  Uric acid  Result Value Ref Range   Uric Acid, Serum 5.7 4.0 - 8.0 mg/dL  Basic metabolic panel  Result Value Ref Range   Glucose, Bld 90 65 - 99 mg/dL   BUN 13 7 - 25 mg/dL   Creat 1.61 (H) 0.96 - 1.18 mg/dL   BUN/Creatinine Ratio 10 6 - 22 (calc)   Sodium 140 135 - 146 mmol/L   Potassium 4.6 3.5 - 5.3 mmol/L   Chloride 104 98 - 110 mmol/L   CO2 26 20 - 32 mmol/L   Calcium 9.8 8.6 - 10.3 mg/dL      Assessment & Plan:   Problem List Items Addressed This Visit      Cardiovascular and Mediastinum   Essential hypertension (Chronic)    Try DASH guidelines; controlled today        Endocrine   Diabetes mellitus type 2, diet-controlled (HCC) - Primary (Chronic)    Foot exam by MD; check A1c, limit sweet drinks, white bread      Relevant Orders   Hemoglobin A1c   Basic metabolic panel     Genitourinary   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)    Avoiding NSAIDs; stay hydrated; avoid salt      Chronic kidney disease, stage III (moderate) (HCC) (Chronic)    Follow-up with kidney doctor; avoid NSAIDs        Other   Gout (Chronic)   Relevant Orders   Uric acid   Obesity (BMI 30-39.9) (Chronic)    Praise given for weight loss; he is  cutting back      Hyperlipidemia LDL goal <100    Try to limit saturated fats; working on weight loss      Relevant Orders   Lipid panel   History of anemia    Check  CBC to make sure no blood loss on aspirin      Relevant Orders   CBC with Differential/Platelet   H/O: GI bleed    Using aspirin and not having any issues with bleeding; patient wishes to continue and watch stools closely and stop immediately if any suspicion of blood in the stools          Follow up plan: Return in about 3 months (around 11/30/2017) for twenty minute follow-up with fasting labs.  An after-visit summary was printed and given to the patient at check-out.  Please see the patient instructions which may contain other information and recommendations beyond what is mentioned above in the assessment and plan.  No orders of the defined types were placed in this encounter.   Orders Placed This Encounter  Procedures  . Lipid panel  . Hemoglobin A1c  . Uric acid  . Basic metabolic panel  . CBC with Differential/Platelet

## 2017-08-30 NOTE — Assessment & Plan Note (Signed)
Follow-up with kidney doctor; avoid NSAIDs

## 2017-08-30 NOTE — Assessment & Plan Note (Addendum)
Using aspirin and not having any issues with bleeding; patient wishes to continue and watch stools closely and stop immediately if any suspicion of blood in the stools

## 2017-08-30 NOTE — Assessment & Plan Note (Signed)
Foot exam by MD; check A1c, limit sweet drinks, white bread

## 2017-08-30 NOTE — Assessment & Plan Note (Signed)
Try DASH guidelines; controlled today 

## 2017-08-30 NOTE — Assessment & Plan Note (Signed)
Try to limit saturated fats; working on weight loss

## 2017-08-30 NOTE — Patient Instructions (Signed)
Check out the information at familydoctor.org entitled "Nutrition for Weight Loss: What You Need to Know about Fad Diets" Try to lose between 1-2 pounds per week by taking in fewer calories and burning off more calories You can succeed by limiting portions, limiting foods dense in calories and fat, becoming more active, and drinking 8 glasses of water a day (64 ounces) Don't skip meals, especially breakfast, as skipping meals may alter your metabolism Do not use over-the-counter weight loss pills or gimmicks that claim rapid weight loss A healthy BMI (or body mass index) is between 18.5 and 24.9 You can calculate your ideal BMI at the NIH website http://www.nhlbi.nih.gov/health/educational/lose_wt/BMI/bmicalc.htm Try to follow the DASH guidelines (DASH stands for Dietary Approaches to Stop Hypertension). Try to limit the sodium in your diet to no more than 1,500mg of sodium per day. Certainly try to not exceed 2,000 mg per day at the very most. Do not add salt when cooking or at the table.  Check the sodium amount on labels when shopping, and choose items lower in sodium when given a choice. Avoid or limit foods that already contain a lot of sodium. Eat a diet rich in fruits and vegetables and whole grains, and try to lose weight if overweight or obese Try to limit saturated fats in your diet (bologna, hot dogs, barbeque, cheeseburgers, hamburgers, steak, bacon, sausage, cheese, etc.) and get more fresh fruits, vegetables, and whole grains  

## 2017-08-31 LAB — LIPID PANEL
CHOL/HDL RATIO: 4.4 (calc) (ref <5.0)
Cholesterol: 135 mg/dL (ref <200)
HDL: 31 mg/dL — ABNORMAL LOW (ref >40)
LDL CHOLESTEROL (CALC): 73 mg/dL
NON-HDL CHOLESTEROL (CALC): 104 mg/dL (ref <130)
TRIGLYCERIDES: 214 mg/dL — AB (ref <150)

## 2017-08-31 LAB — BASIC METABOLIC PANEL
BUN / CREAT RATIO: 12 (calc) (ref 6–22)
BUN: 17 mg/dL (ref 7–25)
CO2: 24 mmol/L (ref 20–32)
Calcium: 9.8 mg/dL (ref 8.6–10.3)
Chloride: 105 mmol/L (ref 98–110)
Creat: 1.45 mg/dL — ABNORMAL HIGH (ref 0.70–1.18)
GLUCOSE: 103 mg/dL — AB (ref 65–99)
POTASSIUM: 4.3 mmol/L (ref 3.5–5.3)
SODIUM: 139 mmol/L (ref 135–146)

## 2017-08-31 LAB — CBC WITH DIFFERENTIAL/PLATELET
BASOS PCT: 1.1 %
Basophils Absolute: 69 cells/uL (ref 0–200)
EOS PCT: 2.7 %
Eosinophils Absolute: 170 cells/uL (ref 15–500)
HCT: 43.9 % (ref 38.5–50.0)
HEMOGLOBIN: 15.4 g/dL (ref 13.2–17.1)
Lymphs Abs: 1947 cells/uL (ref 850–3900)
MCH: 32.5 pg (ref 27.0–33.0)
MCHC: 35.1 g/dL (ref 32.0–36.0)
MCV: 92.6 fL (ref 80.0–100.0)
MPV: 10.4 fL (ref 7.5–12.5)
Monocytes Relative: 9 %
NEUTROS ABS: 3547 {cells}/uL (ref 1500–7800)
Neutrophils Relative %: 56.3 %
PLATELETS: 149 10*3/uL (ref 140–400)
RBC: 4.74 10*6/uL (ref 4.20–5.80)
RDW: 12.5 % (ref 11.0–15.0)
TOTAL LYMPHOCYTE: 30.9 %
WBC mixed population: 567 cells/uL (ref 200–950)
WBC: 6.3 10*3/uL (ref 3.8–10.8)

## 2017-08-31 LAB — HEMOGLOBIN A1C
HEMOGLOBIN A1C: 5.8 %{Hb} — AB (ref <5.7)
Mean Plasma Glucose: 120 (calc)
eAG (mmol/L): 6.6 (calc)

## 2017-08-31 LAB — URIC ACID: Uric Acid, Serum: 7.3 mg/dL (ref 4.0–8.0)

## 2017-09-06 DIAGNOSIS — H2513 Age-related nuclear cataract, bilateral: Secondary | ICD-10-CM | POA: Diagnosis not present

## 2017-09-06 DIAGNOSIS — G4733 Obstructive sleep apnea (adult) (pediatric): Secondary | ICD-10-CM | POA: Diagnosis not present

## 2017-09-06 DIAGNOSIS — H25043 Posterior subcapsular polar age-related cataract, bilateral: Secondary | ICD-10-CM | POA: Diagnosis not present

## 2017-09-06 DIAGNOSIS — H25013 Cortical age-related cataract, bilateral: Secondary | ICD-10-CM | POA: Diagnosis not present

## 2017-09-06 DIAGNOSIS — I10 Essential (primary) hypertension: Secondary | ICD-10-CM | POA: Diagnosis not present

## 2017-09-06 DIAGNOSIS — H02831 Dermatochalasis of right upper eyelid: Secondary | ICD-10-CM | POA: Diagnosis not present

## 2017-09-09 ENCOUNTER — Other Ambulatory Visit: Payer: Self-pay | Admitting: Internal Medicine

## 2017-09-09 DIAGNOSIS — I10 Essential (primary) hypertension: Secondary | ICD-10-CM | POA: Diagnosis not present

## 2017-09-09 DIAGNOSIS — G4733 Obstructive sleep apnea (adult) (pediatric): Secondary | ICD-10-CM | POA: Diagnosis not present

## 2017-09-13 ENCOUNTER — Other Ambulatory Visit: Payer: Self-pay | Admitting: Internal Medicine

## 2017-09-29 ENCOUNTER — Telehealth: Payer: Self-pay | Admitting: Family Medicine

## 2017-09-29 NOTE — Telephone Encounter (Signed)
Copied from CRM [id number]. Topic: Inquiry >> Sep 29, 2017 10:32 AM Yvonna Alanisobinson, Andra M wrote: Reason for CRM: Patient called wanting to speak with Dr. Sherie DonLada. Patient's medication liraglutide (VICTOZA) 18 MG/3ML SOPN is too expensive for the patient. Patient's co-payment is $680.39. Patient would like an affordable replacement. Please call patient today at [phone].       Thank You!!!

## 2017-09-30 NOTE — Telephone Encounter (Signed)
Left voicemail that we have samples for him

## 2017-09-30 NOTE — Telephone Encounter (Signed)
See if we can keep him in samples; I'll sign for some with the rep; this is actually the cheapest of the bunch

## 2017-10-10 DIAGNOSIS — I10 Essential (primary) hypertension: Secondary | ICD-10-CM | POA: Diagnosis not present

## 2017-10-10 DIAGNOSIS — G4733 Obstructive sleep apnea (adult) (pediatric): Secondary | ICD-10-CM | POA: Diagnosis not present

## 2017-10-13 DIAGNOSIS — N261 Atrophy of kidney (terminal): Secondary | ICD-10-CM | POA: Diagnosis not present

## 2017-10-13 DIAGNOSIS — R600 Localized edema: Secondary | ICD-10-CM | POA: Diagnosis not present

## 2017-10-13 DIAGNOSIS — E1129 Type 2 diabetes mellitus with other diabetic kidney complication: Secondary | ICD-10-CM | POA: Diagnosis not present

## 2017-10-13 DIAGNOSIS — N183 Chronic kidney disease, stage 3 (moderate): Secondary | ICD-10-CM | POA: Diagnosis not present

## 2017-10-13 DIAGNOSIS — N281 Cyst of kidney, acquired: Secondary | ICD-10-CM | POA: Diagnosis not present

## 2017-10-18 ENCOUNTER — Other Ambulatory Visit: Payer: Self-pay | Admitting: Nurse Practitioner

## 2017-10-19 ENCOUNTER — Encounter: Payer: Self-pay | Admitting: Internal Medicine

## 2017-10-19 ENCOUNTER — Ambulatory Visit (INDEPENDENT_AMBULATORY_CARE_PROVIDER_SITE_OTHER): Payer: Medicare Other | Admitting: Internal Medicine

## 2017-10-19 VITALS — BP 128/68 | HR 71 | Ht 68.0 in | Wt 226.0 lb

## 2017-10-19 DIAGNOSIS — I10 Essential (primary) hypertension: Secondary | ICD-10-CM

## 2017-10-19 DIAGNOSIS — Z79899 Other long term (current) drug therapy: Secondary | ICD-10-CM | POA: Diagnosis not present

## 2017-10-19 DIAGNOSIS — I5032 Chronic diastolic (congestive) heart failure: Secondary | ICD-10-CM

## 2017-10-19 MED ORDER — LOSARTAN POTASSIUM 25 MG PO TABS: 25.0000 mg | ORAL_TABLET | Freq: Every day | ORAL | 3 refills | Status: DC

## 2017-10-19 NOTE — Patient Instructions (Signed)
Medication Instructions:  Your physician has recommended you make the following change in your medication:  1- STOP Enalapril. 2- START Losartan 25 mg by mouth once a day.   Labwork: Your physician recommends that you return for lab work in: 2 WEEKS FROM STARTING NEW MEDICATION. BMP. [facility]. You will check in at the front desk to the right as you walk into the atrium. Valet Parking is offered if needed.    Testing/Procedures: none  Follow-Up: Your physician recommends that you schedule a follow-up appointment in: 6-8 WEEKS WITH APP.  If you need a refill on your cardiac medications before your next appointment, please call your pharmacy.

## 2017-10-19 NOTE — Progress Notes (Signed)
Follow-up Outpatient Visit Date: 10/19/2017  Primary Care Provider: Kerman PasseyLada, Melinda P, MD [address] [place] [id number]  Chief Complaint: Shortness of breath  HPI:  Drew [name] is a 77 y.o. year-old male with history of HFpEF, hypertension, hyperlipidemia, chronic kidney disease stage III, GERD, and obesity, who presents for follow-up of shortness of breath.  I last saw Drew [name] in December, at which time he was feeling well with addition of carvedilol.  Preceding home sleep study also revealed severe obstructive sleep apnea.  Previously noted myalgias also improved with de-escalation of statin therapy.  No medication changes were made at the time.  Today, Drew [name] reports feeling relatively well, though his wife interjects that Drew [name] seems to have less energy than in the past.  Often times, he will need a day to recuperate after doing strenuous activities like working in the yard.  Overall, his exertional dyspnea seems to be stable.  He has been using his CPAP regularly, though it is sometimes hard to go back to sleep when he awakens in the middle the night.  He denies chest pain, lightheadedness, orthopnea, and PND.  He notes occasional dependent leg edema, which has been long-standing and is stable.  He also reports a single episode of irregular heartbeat within the last week that lasted about 5 minutes.  There were no associated symptoms.  The episode ended spontaneously.  He has not had any other palpitations.  He is compliant with his medications.  Drew [name] notes a "hacking" cough over the last few months that has persisted after an upper respiratory tract infection.  At this time, he is not having any sputum production.  He also denies fevers and chills.  --------------------------------------------------------------------------------------------------  Cardiovascular History & Procedures: Cardiovascular Problems:  Dyspnea on exertion  Leg pain  Risk  Factors:  Hypertension, hyperlipidemia, diabetes mellitus, male gender, and age > 5355  Cath/PCI:  None  CV Surgery:  None  EP Procedures and Devices:  None  Non-Invasive Evaluation(s):  Pharmacologic MPI (06/09/16): Low risk study without ischemia. Normal wall motion with LVEF of 72%.  TTE (03/17/16): Normal LV size percent. There is grade 1 diastolic dysfunction. Aortic valve is moderately thickened without stenosis. There is mild aortic regurgitation. Normal RV size and function.  Lower extremity arterial vascular study (12/22/15): ABIs right 1.5, left 1.6. TBIsright 1.3, left 1.1.  Recent CV Pertinent Labs: Lab Results  Component Value Date   CHOL 135 08/30/2017   HDL 31 (L) 08/30/2017   LDLCALC 73 08/30/2017   TRIG 214 (H) 08/30/2017   CHOLHDL 4.4 08/30/2017   INR 1.32 12/22/2013   K 4.3 08/30/2017   MG 1.9 12/21/2013   BUN 17 08/30/2017   CREATININE 1.45 (H) 08/30/2017    Past medical and surgical history were reviewed and updated in EPIC.  Current Meds  Medication Sig  . allopurinol (ZYLOPRIM) 100 MG tablet Take 100 mg by mouth 2 (two) times daily.  Marland Kitchen. amLODipine (NORVASC) 5 MG tablet TAKE 1 TABLET BY MOUTH  DAILY  . aspirin EC 81 MG tablet Take 81 mg by mouth daily.  . carvedilol (COREG) 3.125 MG tablet TAKE 1 TABLET BY MOUTH TWO  TIMES DAILY  . Coenzyme Q10 (CO Q 10) 100 MG CAPS Take 100 mg by mouth daily.  . hydrochlorothiazide (MICROZIDE) 12.5 MG capsule Take 12.5 mg by mouth 3 (three) times a week.  . Insulin Pen Needle 31G X 5 MM MISC Once a day with Victoza, inject  medicine subcutaneously  . levothyroxine (SYNTHROID, LEVOTHROID) 25 MCG tablet TAKE 1 TABLET BY MOUTH  EVERY MORNING  . liraglutide (VICTOZA) 18 MG/3ML SOPN Inject 0.3 mLs (1.8 mg total) into the skin daily.  . Melatonin 10 MG TABS Take 1 tablet by mouth.  . Multiple Vitamin (MULTIVITAMIN) tablet Take 1 tablet by mouth daily.  . Omega-3 Fatty Acids (FISH OIL) 1000 MG CAPS Take 1,000 mg by  mouth daily.  . ranitidine (ZANTAC) 300 MG tablet TAKE 1 TABLET BY MOUTH AT  BEDTIME  . rosuvastatin (CRESTOR) 5 MG tablet TAKE 1 TABLET BY MOUTH  EVERY OTHER DAY  . [DISCONTINUED] enalapril (VASOTEC) 10 MG tablet Take 10 mg by mouth daily.    Allergies: Niacin and related  Social History   Tobacco Use  . Smoking status: Former Smoker    Types: Cigarettes, Cigars    Last attempt to quit: 1990    Years since quitting: 29.6  . Smokeless tobacco: Never Used  . Tobacco comment: Smoked afternoon cigar  Substance Use Topics  . Alcohol use: No  . Drug use: No    Family History  Problem Relation Age of Onset  . Hyperlipidemia Father   . Kidney disease Father   . Heart disease Father   . Congestive Heart Failure Mother   . Heart disease Mother   . Hyperlipidemia Daughter   . Hyperlipidemia Son   . Diabetes Son   . Anxiety disorder Daughter   . Depression Daughter   . Arthritis Daughter     Review of Systems: A 12-system review of systems was performed and was negative except as noted in the HPI.  --------------------------------------------------------------------------------------------------  Physical Exam: BP 128/68 (BP Location: Left Arm, Patient Position: Sitting, Cuff Size: Normal)   Pulse 71   Ht 5\' 8"  (1.727 m)   Wt 226 lb (102.5 kg)   BMI 34.36 kg/m   General: NAD. HEENT: No conjunctival pallor or scleral icterus. Moist mucous membranes.  OP clear. Neck: Supple without lymphadenopathy, thyromegaly, JVD, or HJR. Lungs: Normal work of breathing. Clear to auscultation bilaterally without wheezes or crackles. Heart: Regular rate and rhythm without murmurs, rubs, or gallops.  Unable to assess PMI due to body habitus. Abd: Bowel sounds present. Soft, NT/ND.  Unable to assess hepatosplenomegaly due to body habitus. Ext: No lower extremity edema. Skin: Warm and dry without rash.  EKG: Normal sinus rhythm with left axis deviation and borderline inferior Q waves.  No  significant change from prior tracing in 02/09/2017.  Lab Results  Component Value Date   WBC 6.3 08/30/2017   HGB 15.4 08/30/2017   HCT 43.9 08/30/2017   MCV 92.6 08/30/2017   PLT 149 08/30/2017    Lab Results  Component Value Date   NA 139 08/30/2017   K 4.3 08/30/2017   CL 105 08/30/2017   CO2 24 08/30/2017   BUN 17 08/30/2017   CREATININE 1.45 (H) 08/30/2017   GLUCOSE 103 (H) 08/30/2017   ALT 33 05/31/2017    Lab Results  Component Value Date   CHOL 135 08/30/2017   HDL 31 (L) 08/30/2017   LDLCALC 73 08/30/2017   TRIG 214 (H) 08/30/2017   CHOLHDL 4.4 08/30/2017    --------------------------------------------------------------------------------------------------  ASSESSMENT AND PLAN: Chronic HFpEF Mr. Schear appears euvolemic on exam today.  He has relatively stable exertional dyspnea, though he seems to have a little bit more fatigue after doing strenuous activities for an extended period.  Some of this may be deconditioning and obesity.  We have agreed to continue his current medications other than switch enalapril to losartan, as detailed below.  If his symptoms worsen, we may need to consider repeating an echo and or further ischemia evaluation with cardiac catheterization (MPI in 05/2016 was low risk).  I encouraged continued CPAP use.  Hypertension Blood pressure normal today.  Given persistent nonproductive cough over the last few months, I think it is worthwhile to switch enalapril to losartan 25 mg daily.  We will check a BMP in about 2 weeks to ensure stable renal function and potassium.  Follow-up: Return to clinic in 6 to 8 weeks to reassess symptoms.  Yvonne Kendall, MD 10/20/2017 7:56 PM

## 2017-10-20 ENCOUNTER — Encounter: Payer: Self-pay | Admitting: Internal Medicine

## 2017-10-20 ENCOUNTER — Encounter: Payer: Self-pay | Admitting: Family Medicine

## 2017-10-20 DIAGNOSIS — I5032 Chronic diastolic (congestive) heart failure: Secondary | ICD-10-CM | POA: Insufficient documentation

## 2017-10-20 DIAGNOSIS — I509 Heart failure, unspecified: Secondary | ICD-10-CM | POA: Insufficient documentation

## 2017-10-28 DIAGNOSIS — H2511 Age-related nuclear cataract, right eye: Secondary | ICD-10-CM | POA: Diagnosis not present

## 2017-10-28 DIAGNOSIS — H2512 Age-related nuclear cataract, left eye: Secondary | ICD-10-CM | POA: Diagnosis not present

## 2017-11-01 ENCOUNTER — Other Ambulatory Visit: Payer: Self-pay

## 2017-11-01 MED ORDER — CARVEDILOL 3.125 MG PO TABS: 3.1250 mg | ORAL_TABLET | Freq: Two times a day (BID) | ORAL | 0 refills | Status: DC

## 2017-11-07 ENCOUNTER — Encounter: Payer: Self-pay | Admitting: Family Medicine

## 2017-11-10 ENCOUNTER — Other Ambulatory Visit: Payer: Self-pay

## 2017-11-10 MED ORDER — ROSUVASTATIN CALCIUM 5 MG PO TABS: 5.0000 mg | ORAL_TABLET | ORAL | 0 refills | Status: DC

## 2017-11-10 NOTE — Telephone Encounter (Signed)
Requested Prescriptions   Signed Prescriptions Disp Refills  . rosuvastatin (CRESTOR) 5 MG tablet 45 tablet 0    Sig: Take 1 tablet (5 mg total) by mouth every other day.    Authorizing Provider: END, CHRISTOPHER    Ordering User: Margrett RudSLAYTON, Cayle Thunder N

## 2017-11-14 ENCOUNTER — Other Ambulatory Visit
Admission: RE | Admit: 2017-11-14 | Discharge: 2017-11-14 | Disposition: A | Discharge status: Home / Self Care | Payer: Medicare Other | Source: Ambulatory Visit | Attending: Internal Medicine | Admitting: Internal Medicine

## 2017-11-14 DIAGNOSIS — Z79899 Other long term (current) drug therapy: Secondary | ICD-10-CM | POA: Insufficient documentation

## 2017-11-14 DIAGNOSIS — I10 Essential (primary) hypertension: Secondary | ICD-10-CM | POA: Diagnosis not present

## 2017-11-14 LAB — BASIC METABOLIC PANEL
Anion gap: 7 (ref 5–15)
BUN: 13 mg/dL (ref 8–23)
CALCIUM: 9.3 mg/dL (ref 8.9–10.3)
CO2: 25 mmol/L (ref 22–32)
CREATININE: 1.22 mg/dL (ref 0.61–1.24)
Chloride: 108 mmol/L (ref 98–111)
GFR calc non Af Amer: 55 mL/min — ABNORMAL LOW (ref >60)
Glucose, Bld: 110 mg/dL — ABNORMAL HIGH (ref 70–99)
Potassium: 3.8 mmol/L (ref 3.5–5.1)
SODIUM: 140 mmol/L (ref 135–145)

## 2017-11-16 ENCOUNTER — Other Ambulatory Visit: Payer: Self-pay

## 2017-11-16 NOTE — Patient Outreach (Signed)
[facility] Customer service manager [facility]) Care Management  11/16/2017  Drew [name] February 19, 1941 [id number]   Medication Adherence call to Drew [name] spoke with patient he is getting samples from doctors office on Victoza because it is too expensive and can not afford to pay $600 dollars a month ask patient if he wants the doctor to switch to a different medication he said yes to something that will cost less, left a message at doctor office waiting for doctor call back. Drew [name] is showing past due under [employer].   Lillia Abed CPhT Pharmacy Technician [facility] Management Direct Dial [phone]  Fax [phone] [email or web address]

## 2017-11-17 ENCOUNTER — Telehealth: Payer: Self-pay

## 2017-11-17 NOTE — Telephone Encounter (Signed)
Copied from CRM [id number]. Topic: General - Other >> Nov 16, 2017  1:48 PM Crist Infante wrote: Reason for CRM: anna with [facility] calling because she was going over pt's med adherence with him, and he has not gotten the liraglutide (VICTOZA) 18 MG/3ML SOPN filled since May. Pt reports to her dr is giving him samples. Tobi Bastos would like to know if Dr Sherie Don can switch him to something his insurance will pay at a reasonable cost.  The victoza is $600 for the pt.

## 2017-11-17 NOTE — Telephone Encounter (Signed)
Okay for samples completely fine with me

## 2017-11-17 NOTE — Telephone Encounter (Signed)
Please disregard first message.  I have spoke with patient and he would like to stay on samples for the meantime since his DM has been controlled with this medication.

## 2017-11-18 ENCOUNTER — Other Ambulatory Visit: Payer: Self-pay

## 2017-11-18 DIAGNOSIS — H2511 Age-related nuclear cataract, right eye: Secondary | ICD-10-CM | POA: Diagnosis not present

## 2017-11-18 HISTORY — PX: CATARACT EXTRACTION, BILATERAL: SHX1313

## 2017-11-18 LAB — HM DIABETES EYE EXAM

## 2017-11-18 MED ORDER — LIRAGLUTIDE 18 MG/3ML ~~LOC~~ SOPN: 1.8000 mg | PEN_INJECTOR | Freq: Every day | SUBCUTANEOUS | 0 refills | Status: DC

## 2017-11-30 ENCOUNTER — Ambulatory Visit (INDEPENDENT_AMBULATORY_CARE_PROVIDER_SITE_OTHER): Payer: Medicare Other | Admitting: Family Medicine

## 2017-11-30 ENCOUNTER — Encounter: Payer: Self-pay | Admitting: Family Medicine

## 2017-11-30 VITALS — BP 122/80 | HR 76 | Temp 98.4°F | Ht 68.0 in | Wt 221.5 lb

## 2017-11-30 DIAGNOSIS — N183 Chronic kidney disease, stage 3 (moderate): Secondary | ICD-10-CM

## 2017-11-30 DIAGNOSIS — K257 Chronic gastric ulcer without hemorrhage or perforation: Secondary | ICD-10-CM | POA: Diagnosis not present

## 2017-11-30 DIAGNOSIS — E1169 Type 2 diabetes mellitus with other specified complication: Secondary | ICD-10-CM

## 2017-11-30 DIAGNOSIS — I10 Essential (primary) hypertension: Secondary | ICD-10-CM | POA: Diagnosis not present

## 2017-11-30 DIAGNOSIS — I129 Hypertensive chronic kidney disease with stage 1 through stage 4 chronic kidney disease, or unspecified chronic kidney disease: Secondary | ICD-10-CM

## 2017-11-30 DIAGNOSIS — Z23 Encounter for immunization: Secondary | ICD-10-CM

## 2017-11-30 DIAGNOSIS — E669 Obesity, unspecified: Secondary | ICD-10-CM

## 2017-11-30 DIAGNOSIS — Z5181 Encounter for therapeutic drug level monitoring: Secondary | ICD-10-CM

## 2017-11-30 DIAGNOSIS — E785 Hyperlipidemia, unspecified: Secondary | ICD-10-CM

## 2017-11-30 MED ORDER — FAMOTIDINE 20 MG PO TABS: 20.0000 mg | ORAL_TABLET | Freq: Every day | ORAL | 3 refills | Status: DC

## 2017-11-30 NOTE — Assessment & Plan Note (Signed)
Foot exam by MD today; no new problems; eye exam UTD; work on weight loss

## 2017-11-30 NOTE — Patient Instructions (Addendum)
Check out the information at familydoctor.org entitled "Nutrition for Weight Loss: What You Need to Know about Fad Diets" Try to lose between 1-2 pounds per week by taking in fewer calories and burning off more calories You can succeed by limiting portions, limiting foods dense in calories and fat, becoming more active, and drinking 8 glasses of water a day (64 ounces) Don't skip meals, especially breakfast, as skipping meals may alter your metabolism Do not use over-the-counter weight loss pills or gimmicks that claim rapid weight loss A healthy BMI (or body mass index) is between 18.5 and 24.9 You can calculate your ideal BMI at the NIH website [employer] Try to limit saturated fats in your diet (bologna, hot dogs, barbeque, cheeseburgers, hamburgers, steak, bacon, sausage, cheese, etc.) and get more fresh fruits, vegetables, and whole grains Let's aim for you to be down five pounds by the next visit

## 2017-11-30 NOTE — Assessment & Plan Note (Signed)
Encouraged weight loss, single most important thing he can do for his diabetes

## 2017-11-30 NOTE — Assessment & Plan Note (Signed)
Pt stopped his H2 blocker; will start new H2 blocker; he is asx

## 2017-11-30 NOTE — Assessment & Plan Note (Signed)
Check lipids; work on weight loss and healthy eating 

## 2017-11-30 NOTE — Progress Notes (Signed)
BP 122/80   Pulse 76   Temp 98.4 F (36.9 C) (Oral)   Ht 5\' 8"  (1.727 m)   Wt 221 lb 8 oz (100.5 kg)   SpO2 94%   BMI 33.68 kg/m    Subjective:    Patient ID: Drew [name], male    DOB: Jan 14, 1941, 77 y.o.   MRN: [id number]  HPI: Drew [name] is a 77 y.o. male  Chief Complaint  Patient presents with  . Follow-up    HPI Type 2 diabetes; chronic problem; checks sugars morning; highest and lowest over the last week, 121 and 98; no really low; just saw eye doctor; no foot problems; really trying with his diet  CKD; chronic problem; avoiding NSAIDs; using only tylenol if needed; good water drinker  Hypothyroidism; bowels are pretty normal, except one week last month, loose and then hard, but swung back to normal now  Lab Results  Component Value Date   TSH 2.20 05/31/2017   High cholesterol; chronic problem; air fryer of deep fried; does eat some cheese; did not know it was fatty  Lab Results  Component Value Date   CHOL 135 08/30/2017   HDL 31 (L) 08/30/2017   LDLCALC 73 08/30/2017   TRIG 214 (H) 08/30/2017   CHOLHDL 4.4 08/30/2017   GERD; stopped the ranitidine; using nothing right now, just stopped b/c of cancer fears  HTN; well-controlled  Depression screen [facility] 2/9 11/30/2017 11/30/2017 08/30/2017 05/31/2017 11/30/2016  Decreased Interest 0 0 0 0 0  Down, Depressed, Hopeless 0 0 0 0 0  PHQ - 2 Score 0 0 0 0 0  Altered sleeping 0 - - - -  Tired, decreased energy 0 - - - -  Change in appetite 0 - - - -  Feeling bad or failure about yourself  0 - - - -  Trouble concentrating 0 - - - -  Moving slowly or fidgety/restless 0 - - - -  Suicidal thoughts 0 - - - -  PHQ-9 Score 0 - - - -  Difficult doing work/chores Not difficult at all - - - -   Fall Risk  11/30/2017 11/30/2017 08/30/2017 05/31/2017 11/30/2016  Falls in the past year? Yes No No No No  Number falls in past yr: 1 - - - -  Injury with Fall? No - - - -    Relevant past medical, surgical, family and social  history reviewed Past Medical History:  Diagnosis Date  . Chronic kidney disease, stage III (moderate) (HCC) 08/15/2015  . Diabetes mellitus with complication (HCC)   . GERD (gastroesophageal reflux disease)   . H/O: upper GI bleed 2015  . Hypercholesteremia   . Hypertension   . Obesity (BMI 30-39.9) 08/07/2015  . Peripheral vascular disease (HCC) 12/26/2015   Past Surgical History:  Procedure Laterality Date  . CATARACT EXTRACTION, BILATERAL Bilateral 11/18/2017  . COLONOSCOPY N/A 12/27/2013   Procedure: COLONOSCOPY;  Surgeon: Barrie Folk, MD;  Location: [facility] ENDOSCOPY;  Service: Endoscopy;  Laterality: N/A;  . ENTEROSCOPY N/A 12/21/2013   Procedure: ENTEROSCOPY;  Surgeon: Vertell Novak., MD;  Location: [facility] ENDOSCOPY;  Service: Endoscopy;  Laterality: N/A;  . ENTEROSCOPY N/A 12/27/2013   Procedure: ENTEROSCOPY;  Surgeon: Barrie Folk, MD;  Location: [facility] ENDOSCOPY;  Service: Endoscopy;  Laterality: N/A;   Family History  Problem Relation Age of Onset  . Hyperlipidemia Father   . Kidney disease Father   . Heart disease Father   . Congestive Heart  Failure Mother   . Heart disease Mother   . Hyperlipidemia Daughter   . Hyperlipidemia Son   . Diabetes Son   . Anxiety disorder Daughter   . Depression Daughter   . Arthritis Daughter    Social History   Tobacco Use  . Smoking status: Former Smoker    Types: Cigarettes, Cigars    Last attempt to quit: 1990    Years since quitting: 29.7  . Smokeless tobacco: Never Used  . Tobacco comment: Smoked afternoon cigar  Substance Use Topics  . Alcohol use: No  . Drug use: No     Office Visit from 11/30/2017 in [facility]  AUDIT-C Score  0      Interim medical history since last visit reviewed. Allergies and medications reviewed  Review of Systems  Respiratory: Negative for wheezing.   Cardiovascular: Negative for chest pain.   Per HPI unless specifically indicated above     Objective:    BP 122/80    Pulse 76   Temp 98.4 F (36.9 C) (Oral)   Ht 5\' 8"  (1.727 m)   Wt 221 lb 8 oz (100.5 kg)   SpO2 94%   BMI 33.68 kg/m   Wt Readings from Last 3 Encounters:  11/30/17 221 lb 8 oz (100.5 kg)  10/19/17 226 lb (102.5 kg)  08/30/17 226 lb 6.4 oz (102.7 kg)    Physical Exam  Constitutional: He appears well-developed and well-nourished. No distress.  Weight down 4.5 pounds over the last 6 weeks  HENT:  Head: Normocephalic and atraumatic.  Eyes: EOM are normal. No scleral icterus.  Neck: No thyromegaly present.  Cardiovascular: Normal rate and regular rhythm.  Pulmonary/Chest: Effort normal and breath sounds normal.  Abdominal: Soft. Bowel sounds are normal. He exhibits no distension.  Musculoskeletal: He exhibits no edema.  Neurological: Coordination normal.  Skin: Skin is warm and dry. No pallor.  Psychiatric: He has a normal mood and affect. His behavior is normal. Judgment and thought content normal.   Diabetic Foot Form - Detailed   Diabetic Foot Exam - detailed Diabetic Foot exam was performed with the following findings:  Yes 11/30/2017 10:03 AM  Visual Foot Exam completed.:  Yes  Pulse Foot Exam completed.:  Yes  Right Dorsalis Pedis:  Present Left Dorsalis Pedis:  Present  Sensory Foot Exam Completed.:  Yes Semmes-Weinstein Monofilament Test R Site 1-Great Toe:  Pos L Site 1-Great Toe:  Pos        Results for orders placed or performed during the hospital encounter of 11/14/17  Basic metabolic panel  Result Value Ref Range   Sodium 140 135 - 145 mmol/L   Potassium 3.8 3.5 - 5.1 mmol/L   Chloride 108 98 - 111 mmol/L   CO2 25 22 - 32 mmol/L   Glucose, Bld 110 (H) 70 - 99 mg/dL   BUN 13 8 - 23 mg/dL   Creatinine, Ser 1.61 0.61 - 1.24 mg/dL   Calcium 9.3 8.9 - 09.6 mg/dL   GFR calc non Af Amer 55 (L) >60 mL/min   GFR calc Af Amer >60 >60 mL/min   Anion gap 7 5 - 15      Assessment & Plan:   Problem List Items Addressed This Visit      Cardiovascular and  Mediastinum   Essential hypertension (Chronic)    Controlled today; continue regimen; work on weight loss and healthy eating        Digestive   Chronic multiple gastric  erosions    Pt stopped his H2 blocker; will start new H2 blocker; he is asx        Endocrine   Diabetes mellitus type 2 in obese (HCC) - Primary    Foot exam by MD today; no new problems; eye exam UTD; work on weight loss      Relevant Orders   Lipid panel   Hemoglobin A1c     Genitourinary   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)    Monitored by nephrologist; he is drinking water and avoiding NSAIDs        Other   Obesity (BMI 30-39.9) (Chronic)    Encouraged weight loss, single most important thing he can do for his diabetes      Medication monitoring encounter    Check liver and kidneys periodically, both UTD      Dyslipidemia (high LDL; low HDL)    Check lipids; work on weight loss and healthy eating       Other Visit Diagnoses    Need for influenza vaccination       Relevant Orders   Flu vaccine HIGH DOSE PF (Fluzone High dose) (Completed)       Follow up plan: Return in about 3 months (around 03/02/2018) for follow-up visit with Dr. Sherie Don.  An after-visit summary was printed and given to the patient at check-out.  Please see the patient instructions which may contain other information and recommendations beyond what is mentioned above in the assessment and plan.  Meds ordered this encounter  Medications  . famotidine (PEPCID) 20 MG tablet    Sig: Take 1 tablet (20 mg total) by mouth daily.    Dispense:  90 tablet    Refill:  3    Orders Placed This Encounter  Procedures  . Flu vaccine HIGH DOSE PF (Fluzone High dose)  . Lipid panel  . Hemoglobin A1c

## 2017-11-30 NOTE — Assessment & Plan Note (Signed)
Check liver and kidneys periodically, both UTD

## 2017-11-30 NOTE — Assessment & Plan Note (Signed)
Controlled today; continue regimen; work on weight loss and healthy eating

## 2017-11-30 NOTE — Assessment & Plan Note (Signed)
Monitored by nephrologist; he is drinking water and avoiding NSAIDs

## 2017-12-01 ENCOUNTER — Ambulatory Visit: Payer: Medicare Other | Admitting: Nurse Practitioner

## 2017-12-01 ENCOUNTER — Encounter: Payer: Self-pay | Admitting: Nurse Practitioner

## 2017-12-01 VITALS — BP 110/80 | HR 62 | Ht 68.0 in | Wt 225.5 lb

## 2017-12-01 DIAGNOSIS — E782 Mixed hyperlipidemia: Secondary | ICD-10-CM

## 2017-12-01 DIAGNOSIS — I5032 Chronic diastolic (congestive) heart failure: Secondary | ICD-10-CM | POA: Diagnosis not present

## 2017-12-01 DIAGNOSIS — N183 Chronic kidney disease, stage 3 unspecified: Secondary | ICD-10-CM

## 2017-12-01 DIAGNOSIS — I10 Essential (primary) hypertension: Secondary | ICD-10-CM

## 2017-12-01 LAB — LIPID PANEL
Cholesterol: 144 mg/dL (ref <200)
HDL: 29 mg/dL — AB (ref >40)
LDL CHOLESTEROL (CALC): 82 mg/dL
Non-HDL Cholesterol (Calc): 115 mg/dL (calc) (ref <130)
TRIGLYCERIDES: 238 mg/dL — AB (ref <150)
Total CHOL/HDL Ratio: 5 (calc) — ABNORMAL HIGH (ref <5.0)

## 2017-12-01 LAB — HEMOGLOBIN A1C
EAG (MMOL/L): 6.8 (calc)
Hgb A1c MFr Bld: 5.9 % of total Hgb — ABNORMAL HIGH (ref <5.7)
MEAN PLASMA GLUCOSE: 123 (calc)

## 2017-12-01 NOTE — Patient Instructions (Addendum)
.  Medication Instructions: Your physician recommends that you continue on your current medications as directed. Please refer to the Current Medication list given to you today.   If you need a refill on your cardiac medications before your next appointment, please call your pharmacy.   Lab work: None ordered  If you have labs (blood work) drawn today and your tests are completely normal, you will receive your results only by: Marland Kitchen MyChart Message (if you have MyChart) OR . A paper copy in the mail If you have any lab test that is abnormal or we need to change your treatment, we will call you to review the results.    Testing/Procedures: None ordered    Follow-Up: Your physician wants you to follow-up in: 6 months with Dr. Okey Dupre. You will receive a reminder letter in the mail two months in advance. If you don't receive a letter, please call our office to schedule the follow-up appointment.    At [facility], you and your health needs are our priority.  As part of our continuing mission to provide you with exceptional heart care, we have created designated Provider Care Teams.  These Care Teams include your primary Cardiologist (physician) and Advanced Practice Providers (APPs -  Physician Assistants and Nurse Practitioners) who all work together to provide you with the care you need, when you need it. .      Any Other Special Instructions Will Be Listed Below (If Applicable).

## 2017-12-01 NOTE — Progress Notes (Signed)
Office Visit    Patient Name: Drew [name] Date of Encounter: 12/01/2017  Primary Care Provider:  Kerman Passey, MD Primary Cardiologist:  Yvonne Kendall, MD  Chief Complaint    77 year old male with a history of HFpEF, hypertension, hyperlipidemia, stage III chronic kidney disease, GERD, and obesity, who presents for follow-up related to dyspnea.  Past Medical History    Past Medical History:  Diagnosis Date  . (HFpEF) heart failure with preserved ejection fraction (HCC)    a. 02/2016 Echo: EF nl. Gr1 DD. Mild AI. Mod thickened AoV w/o stenosis. Nl RV size and fxn.  . Chronic kidney disease, stage III (moderate) (HCC) 08/15/2015  . Diabetes mellitus with complication (HCC)   . GERD (gastroesophageal reflux disease)   . H/O: upper GI bleed 2015  . History of stress test    a. 05/2016 Lexiscan MV: EF 72%, No ischemia-->Low risk.  Marland Kitchen Hypercholesteremia   . Hypertension   . Obesity (BMI 30-39.9) 08/07/2015  . Peripheral vascular disease (HCC) 12/26/2015   Past Surgical History:  Procedure Laterality Date  . CATARACT EXTRACTION, BILATERAL Bilateral 11/18/2017  . COLONOSCOPY N/A 12/27/2013   Procedure: COLONOSCOPY;  Surgeon: Barrie Folk, MD;  Location: [facility] ENDOSCOPY;  Service: Endoscopy;  Laterality: N/A;  . ENTEROSCOPY N/A 12/21/2013   Procedure: ENTEROSCOPY;  Surgeon: Vertell Novak., MD;  Location: [facility] ENDOSCOPY;  Service: Endoscopy;  Laterality: N/A;  . ENTEROSCOPY N/A 12/27/2013   Procedure: ENTEROSCOPY;  Surgeon: Barrie Folk, MD;  Location: [facility] ENDOSCOPY;  Service: Endoscopy;  Laterality: N/A;    Allergies  Allergies  Allergen Reactions  . Niacin And Related Itching and Rash    History of Present Illness    77 year old male with the above past medical history including HFpEF, hypertension, hyperlipidemia, stage III chronic kidney disease, GERD, and obesity.  In the setting of chronic dyspnea, he has previously been evaluated with echocardiogram in January 2018  showing normal LV function and grade 1 diastolic dysfunction.  Mild AI was also noted.  He also underwent a stress testing in April 2018 which was low risk.  He was last seen in clinic in August, at which time he reported a hacking cough over a several month period.  He was on enalapril at the time and this was discontinued in favor of losartan.  Following this change, cough has completely subsided.  Over the past month, he has done well.  He has been helping his wife with yard work and surprised himself with improved exercise tolerance.  He still expresses dyspnea on exertion at higher levels of activity but overall has been doing well.  He has not had any chest pain and further denies any palpitations, PND, orthopnea, dizziness, syncope, edema, or early satiety.  Home Medications    Prior to Admission medications   Medication Sig Start Date End Date Taking? Authorizing Provider  allopurinol (ZYLOPRIM) 100 MG tablet Take 100 mg by mouth 2 (two) times daily.   Yes [provider]  amLODipine (NORVASC) 5 MG tablet TAKE 1 TABLET BY MOUTH  DAILY 04/08/17  Yes Dunn, Raymon Mutton, PA-C  aspirin EC 81 MG tablet Take 81 mg by mouth daily.   Yes [provider]  carvedilol (COREG) 3.125 MG tablet Take 1 tablet (3.125 mg total) by mouth 2 (two) times daily. 11/01/17  Yes End, Cristal Deer, MD  Coenzyme Q10 (CO Q 10) 100 MG CAPS Take 100 mg by mouth daily.   Yes [provider]  famotidine (PEPCID) 20 MG tablet Take 1 tablet (20 mg total) by mouth daily. 11/30/17  Yes Lada, Janit Bern, MD  hydrochlorothiazide (MICROZIDE) 12.5 MG capsule Take 12.5 mg by mouth 3 (three) times a week.   Yes [provider]  Insulin Pen Needle 31G X 5 MM MISC Once a day with Victoza, inject medicine subcutaneously 07/05/17  Yes Lada, Janit Bern, MD  levothyroxine (SYNTHROID, LEVOTHROID) 25 MCG tablet TAKE 1 TABLET BY MOUTH  EVERY MORNING 04/08/17  Yes Lada, Janit Bern, MD  liraglutide (VICTOZA) 18 MG/3ML SOPN  Inject 0.3 mLs (1.8 mg total) into the skin daily. 11/18/17  Yes Lada, Janit Bern, MD  Melatonin 10 MG TABS Take 1 tablet by mouth.   Yes [provider]  Multiple Vitamin (MULTIVITAMIN) tablet Take 1 tablet by mouth daily.   Yes [provider]  Omega-3 Fatty Acids (FISH OIL) 1000 MG CAPS Take 1,000 mg by mouth daily.   Yes [provider]  rosuvastatin (CRESTOR) 5 MG tablet Take 1 tablet (5 mg total) by mouth every other day. 11/10/17  Yes End, Cristal Deer, MD  losartan (COZAAR) 25 MG tablet Take 1 tablet (25 mg total) by mouth daily. Patient not taking: Reported on 12/01/2017 10/19/17 01/17/18  End, Cristal Deer, MD    Review of Systems    As above, he has some chronic dyspnea on exertion at higher levels of activity but has been overall doing well without chest pain, palpitations, PND, orthopnea, dizziness, syncope, edema, or early satiety.  All other systems reviewed and are otherwise negative except as noted above.  Physical Exam    VS:  BP 110/80 (BP Location: Left Arm, Patient Position: Sitting, Cuff Size: Normal)   Pulse 62   Ht 5\' 8"  (1.727 m)   Wt 225 lb 8 oz (102.3 kg)   BMI 34.29 kg/m  , BMI Body mass index is 34.29 kg/m. GEN: Well nourished, well developed, in no acute distress. HEENT: normal. Neck: Supple, no JVD, carotid bruits, or masses. Cardiac: RRR, no murmurs, rubs, or gallops. No clubbing, cyanosis, edema.  Radials/DP/PT 2+ and equal bilaterally.  Respiratory:  Respirations regular and unlabored, clear to auscultation bilaterally. GI: Soft, nontender, nondistended, BS + x 4. MS: no deformity or atrophy. Skin: warm and dry, no rash. Neuro:  Strength and sensation are intact. Psych: Normal affect.  Accessory Clinical Findings    ECG personally reviewed by me today -regular sinus rhythm, 62, left axis deviation, nonspecific T changes- no acute changes.  Lab Results  Component Value Date   CREATININE 1.22 11/14/2017   BUN 13 11/14/2017     NA 140 11/14/2017   K 3.8 11/14/2017   CL 108 11/14/2017   CO2 25 11/14/2017     Assessment & Plan    1.  HFpEF: Patient has been doing well over the past month.  His weight is stable today and he has no evidence of volume overload.  Heart rate and blood pressure stable.  He is not weighing himself at home and we discussed the importance of daily weights along with sodium restriction and symptom reporting.  He and his wife verbalized understanding and.  He does have a scale at home and does plan to use it.  With improvement in symptoms, I will defer any repeat testing at this time.  2.  Essential hypertension: Well-controlled on calcium channel blocker, beta-blocker, diuretic, and ARB therapy.  No changes today.  3.  Hyperlipidemia: LDL was 47 in 2018.  He had normal  LFTs in April.  Continue statin therapy.  4.  Stage III chronic kidney disease: Creatinine recently stable at 1.22 on September 23.  5.  Cough: Resolved after switching from enalapril to losartan.  6.  Disposition: Follow-up in 3 to 6 months or sooner if necessary.  Nicolasa Ducking, NP 12/01/2017, 11:02 AM

## 2017-12-02 ENCOUNTER — Encounter: Payer: Self-pay | Admitting: Family Medicine

## 2017-12-05 DIAGNOSIS — G4733 Obstructive sleep apnea (adult) (pediatric): Secondary | ICD-10-CM | POA: Diagnosis not present

## 2017-12-05 DIAGNOSIS — I10 Essential (primary) hypertension: Secondary | ICD-10-CM | POA: Diagnosis not present

## 2017-12-07 ENCOUNTER — Telehealth: Payer: Self-pay

## 2017-12-07 NOTE — Telephone Encounter (Signed)
Does he even need this medication at all?  I do not think he has reflux, He was just placed on it in the hospital when he had a GI bleed years ago?

## 2017-12-07 NOTE — Telephone Encounter (Signed)
Copied from CRM [id number]. Topic: General - Other >> Dec 06, 2017  4:09 PM Percival Spanish wrote:  Pt said he read in the paperwork that the below med will affect his kidneys and he is not willing to take.   famotidine (PEPCID) 20 MG tablet

## 2017-12-07 NOTE — Telephone Encounter (Signed)
Left voicemail with Dr. Thedore Mins nurse

## 2017-12-07 NOTE — Telephone Encounter (Signed)
Please call over and speak to his nephrologist; find out what they would recommend for prevention of gastric ulcers; if ranitidine is not appropriate and patient is worried about famotidine, would they recommend something else given his CKD? He had pretty significant gastric and duodenal erosions on EGD and I'd like to have something for prevention given how severe that sounded

## 2017-12-08 NOTE — Telephone Encounter (Signed)
Sighn's nurse contacted back states its ok for him to take Famotidine.  Pt notified

## 2017-12-29 ENCOUNTER — Other Ambulatory Visit: Payer: Self-pay | Admitting: Internal Medicine

## 2018-01-09 DIAGNOSIS — I10 Essential (primary) hypertension: Secondary | ICD-10-CM | POA: Diagnosis not present

## 2018-01-09 DIAGNOSIS — G4733 Obstructive sleep apnea (adult) (pediatric): Secondary | ICD-10-CM | POA: Diagnosis not present

## 2018-01-18 ENCOUNTER — Other Ambulatory Visit: Payer: Self-pay

## 2018-01-18 MED ORDER — LIRAGLUTIDE 18 MG/3ML ~~LOC~~ SOPN: 1.8000 mg | PEN_INJECTOR | Freq: Every day | SUBCUTANEOUS | 0 refills | Status: DC

## 2018-01-18 NOTE — Telephone Encounter (Signed)
Patient requesting samples. 2 boxes given to patient.

## 2018-01-20 ENCOUNTER — Ambulatory Visit: Payer: Medicare Other

## 2018-01-27 ENCOUNTER — Ambulatory Visit (INDEPENDENT_AMBULATORY_CARE_PROVIDER_SITE_OTHER): Payer: Medicare Other

## 2018-01-27 VITALS — BP 118/78 | HR 71 | Temp 98.5°F | Resp 16 | Ht 68.0 in | Wt 222.9 lb

## 2018-01-27 DIAGNOSIS — Z0000 Encounter for general adult medical examination without abnormal findings: Secondary | ICD-10-CM | POA: Diagnosis not present

## 2018-01-27 NOTE — Patient Instructions (Signed)
[name] , Thank you for taking time to come for your Medicare Wellness Visit. I appreciate your ongoing commitment to your health goals. Please review the following plan we discussed and let me know if I can assist you in the future.   Screening recommendations/referrals: Recommended yearly ophthalmology/optometry visit for glaucoma screening and checkup Recommended yearly dental visit for hygiene and checkup  Vaccinations: Influenza vaccine: done 11/30/17 Pneumococcal vaccine: done 12/14/13 Tdap vaccine: due - please contact us if you get a cut or scrape.  Shingles vaccine: Shingrix discussed. Please contact your insurance or pharmacy for coverage information.     Advanced directives: Advance directive discussed with you today. I have provided a copy for you to complete at home and have notarized. Once this is complete please bring a copy in to our office so we can scan it into your chart.  Conditions/risks identified: Recommend fall prevention by installing a handrail for steps and grab bars in the shower.   Next appointment: Please follow up in one year for your Medicare Annual Wellness visit.    Preventive Care 3665 Years and Older, Male Preventive care refers to lifestyle choices and visits with your health care provider that can promote health and wellness. What does preventive care include?  A yearly physical exam. This is also called an annual well check.  Dental exams once or twice a year.  Routine eye exams. Ask your health care provider how often you should have your eyes checked.  Personal lifestyle choices, including:  Daily care of your teeth and gums.  Regular physical activity.  Eating a healthy diet.  Avoiding tobacco and drug use.  Limiting alcohol use.  Practicing safe sex.  Taking low doses of aspirin every day.  Taking vitamin and mineral supplements as recommended by your health care provider. What happens during an annual well check? The services  and screenings done by your health care provider during your annual well check will depend on your age, overall health, lifestyle risk factors, and family history of disease. Counseling  Your health care provider may ask you questions about your:  Alcohol use.  Tobacco use.  Drug use.  Emotional well-being.  Home and relationship well-being.  Sexual activity.  Eating habits.  History of falls.  Memory and ability to understand (cognition).  Work and work Astronome[employer]vironment. Screening  You may have the following tests or measurements:  Height, weight, and BMI.  Blood pressure.  Lipid and cholesterol levels. These may be checked every 5 years, or more frequently if you are over 78 years old.  Skin check.  Lung cancer screening. You may have this screening every year starting at age 355 if you have a 30-pack-year history of smoking and currently smoke or have quit within the past 15 years.  Fecal occult blood test (FOBT) of the stool. You may have this test every year starting at age 78.  Flexible sigmoidoscopy or colonoscopy. You may have a sigmoidoscopy every 5 years or a colonoscopy every 10 years starting at age 650.  Prostate cancer screening. Recommendations will vary depending on your family history and other risks.  Hepatitis C blood test.  Hepatitis B blood test.  Sexually transmitted disease (STD) testing.  Diabetes screening. This is done by checking your blood sugar (glucose) after you have not eaten for a while (fasting). You may have this done every 1-3 years.  Abdominal aortic aneurysm (AAA) screening. You may need this if you are a current or former smoker.  Osteoporosis.  You may be screened starting at age 86 if you are at high risk. Talk with your health care provider about your test results, treatment options, and if necessary, the need for more tests. Vaccines  Your health care provider may recommend certain vaccines, such as:  Influenza vaccine. This  is recommended every year.  Tetanus, diphtheria, and acellular pertussis (Tdap, Td) vaccine. You may need a Td booster every 10 years.  Zoster vaccine. You may need this after age 20.  Pneumococcal 13-valent conjugate (PCV13) vaccine. One dose is recommended after age 67.  Pneumococcal polysaccharide (PPSV23) vaccine. One dose is recommended after age 33. Talk to your health care provider about which screenings and vaccines you need and how often you need them. This information is not intended to replace advice given to you by your health care provider. Make sure you discuss any questions you have with your health care provider. Document Released: 03/07/2015 Document Revised: 10/29/2015 Document Reviewed: 12/10/2014 Elsevier Interactive Patient Education  2017 [place] Prevention in the Home Falls can cause injuries. They can happen to people of all ages. There are many things you can do to make your home safe and to help prevent falls. What can I do on the outside of my home?  Regularly fix the edges of walkways and driveways and fix any cracks.  Remove anything that might make you trip as you walk through a door, such as a raised step or threshold.  Trim any bushes or trees on the path to your home.  Use bright outdoor lighting.  Clear any walking paths of anything that might make someone trip, such as rocks or tools.  Regularly check to see if handrails are loose or broken. Make sure that both sides of any steps have handrails.  Any raised decks and porches should have guardrails on the edges.  Have any leaves, snow, or ice cleared regularly.  Use sand or salt on walking paths during winter.  Clean up any spills in your garage right away. This includes oil or grease spills. What can I do in the bathroom?  Use night lights.  Install grab bars by the toilet and in the tub and shower. Do not use towel bars as grab bars.  Use non-skid mats or decals in the tub or  shower.  If you need to sit down in the shower, use a plastic, non-slip stool.  Keep the floor dry. Clean up any water that spills on the floor as soon as it happens.  Remove soap buildup in the tub or shower regularly.  Attach bath mats securely with double-sided non-slip rug tape.  Do not have throw rugs and other things on the floor that can make you trip. What can I do in the bedroom?  Use night lights.  Make sure that you have a light by your bed that is easy to reach.  Do not use any sheets or blankets that are too big for your bed. They should not hang down onto the floor.  Have a firm chair that has side arms. You can use this for support while you get dressed.  Do not have throw rugs and other things on the floor that can make you trip. What can I do in the kitchen?  Clean up any spills right away.  Avoid walking on wet floors.  Keep items that you use a lot in easy-to-reach places.  If you need to reach something above you, use a strong step stool  that has a grab bar.  Keep electrical cords out of the way.  Do not use floor polish or wax that makes floors slippery. If you must use wax, use non-skid floor wax.  Do not have throw rugs and other things on the floor that can make you trip. What can I do with my stairs?  Do not leave any items on the stairs.  Make sure that there are handrails on both sides of the stairs and use them. Fix handrails that are broken or loose. Make sure that handrails are as long as the stairways.  Check any carpeting to make sure that it is firmly attached to the stairs. Fix any carpet that is loose or worn.  Avoid having throw rugs at the top or bottom of the stairs. If you do have throw rugs, attach them to the floor with carpet tape.  Make sure that you have a light switch at the top of the stairs and the bottom of the stairs. If you do not have them, ask someone to add them for you. What else can I do to help prevent  falls?  Wear shoes that:  Do not have high heels.  Have rubber bottoms.  Are comfortable and fit you well.  Are closed at the toe. Do not wear sandals.  If you use a stepladder:  Make sure that it is fully opened. Do not climb a closed stepladder.  Make sure that both sides of the stepladder are locked into place.  Ask someone to hold it for you, if possible.  Clearly mark and make sure that you can see:  Any grab bars or handrails.  First and last steps.  Where the edge of each step is.  Use tools that help you move around (mobility aids) if they are needed. These include:  Canes.  Walkers.  Scooters.  Crutches.  Turn on the lights when you go into a dark area. Replace any light bulbs as soon as they burn out.  Set up your furniture so you have a clear path. Avoid moving your furniture around.  If any of your floors are uneven, fix them.  If there are any pets around you, be aware of where they are.  Review your medicines with your doctor. Some medicines can make you feel dizzy. This can increase your chance of falling. Ask your doctor what other things that you can do to help prevent falls. This information is not intended to replace advice given to you by your health care provider. Make sure you discuss any questions you have with your health care provider. Document Released: 12/05/2008 Document Revised: 07/17/2015 Document Reviewed: 03/15/2014 Elsevier Interactive Patient Education  2017 [employer].

## 2018-01-27 NOTE — Progress Notes (Signed)
Subjective:   Drew [name] is a 77 y.o. male who presents for an Initial Medicare Annual Wellness Visit.  Review of Systems   Cardiac Risk Factors include: advanced age (>6055men, 79>65 women);diabetes mellitus;dyslipidemia;hypertension;male gender;obesity (BMI >30kg/m2)    Objective:    Today's Vitals   01/27/18 1406  BP: 118/78  Pulse: 71  Resp: 16  Temp: 98.5 F (36.9 C)  TempSrc: Oral  SpO2: 94%  Weight: 222 lb 14.4 oz (101.1 kg)  Height: 5\' 8"  (1.727 m)   Body mass index is 33.89 kg/m.  Advanced Directives 01/27/2018 11/30/2016 08/27/2016 02/09/2016 12/31/2015 11/24/2015 08/07/2015  Does Patient Have a Medical Advance Directive? No No No Yes No No No  Would patient like information on creating a medical advance directive? Yes (MAU/Ambulatory/Procedural Areas - Information given) - - - - No - patient declined information No - patient declined information    Current Medications (verified) Outpatient Encounter Medications as of 01/27/2018  Medication Sig  . allopurinol (ZYLOPRIM) 100 MG tablet Take 100 mg by mouth 2 (two) times daily.  Marland Kitchen. amLODipine (NORVASC) 5 MG tablet TAKE 1 TABLET BY MOUTH  DAILY  . aspirin EC 81 MG tablet Take 81 mg by mouth daily.  . carvedilol (COREG) 3.125 MG tablet Take 1 tablet (3.125 mg total) by mouth 2 (two) times daily.  . famotidine (PEPCID) 20 MG tablet Take 20 mg by mouth daily.   . hydrochlorothiazide (MICROZIDE) 12.5 MG capsule Take 12.5 mg by mouth 3 (three) times a week.  . Insulin Pen Needle 31G X 5 MM MISC Once a day with Victoza, inject medicine subcutaneously  . levothyroxine (SYNTHROID, LEVOTHROID) 25 MCG tablet TAKE 1 TABLET BY MOUTH  EVERY MORNING  . liraglutide (VICTOZA) 18 MG/3ML SOPN Inject 0.3 mLs (1.8 mg total) into the skin daily.  Marland Kitchen. losartan (COZAAR) 25 MG tablet Take 1 tablet (25 mg total) by mouth daily.  . Melatonin 10 MG TABS Take 1 tablet by mouth.  . Multiple Vitamin (MULTIVITAMIN) tablet Take 1 tablet by mouth daily.  .  Omega-3 Fatty Acids (FISH OIL) 1000 MG CAPS Take 1,000 mg by mouth daily.  . rosuvastatin (CRESTOR) 5 MG tablet TAKE 1 TABLET BY MOUTH  EVERY OTHER DAY  . Coenzyme Q10 (CO Q 10) 100 MG CAPS Take 100 mg by mouth daily.   No facility-administered encounter medications on file as of 01/27/2018.     Allergies (verified) Niacin and related   History: Past Medical History:  Diagnosis Date  . (HFpEF) heart failure with preserved ejection fraction (HCC)    a. 02/2016 Echo: EF nl. Gr1 DD. Mild AI. Mod thickened AoV w/o stenosis. Nl RV size and fxn.  . Chronic kidney disease, stage III (moderate) (HCC) 08/15/2015  . Diabetes mellitus with complication (HCC)   . GERD (gastroesophageal reflux disease)   . H/O: upper GI bleed 2015  . History of stress test    a. 05/2016 Lexiscan MV: EF 72%, No ischemia-->Low risk.  Marland Kitchen. Hypercholesteremia   . Hypertension   . Obesity (BMI 30-39.9) 08/07/2015  . Peripheral vascular disease (HCC) 12/26/2015   Past Surgical History:  Procedure Laterality Date  . CATARACT EXTRACTION, BILATERAL Bilateral 11/18/2017  . COLONOSCOPY N/A 12/27/2013   Procedure: COLONOSCOPY;  Surgeon: Barrie FolkJohn C Hayes, MD;  [facility];  Service: Endoscopy;  Laterality: N/A;  . ENTEROSCOPY N/A 12/21/2013   Procedure: ENTEROSCOPY;  Surgeon: Vertell NovakJames L Edwards Jr., MD;  [facility];  Service: Endoscopy;  Laterality: N/A;  .  ENTEROSCOPY N/A 12/27/2013   Procedure: ENTEROSCOPY;  Surgeon: Barrie Folk, MD;  Location: [facility] ENDOSCOPY;  Service: Endoscopy;  Laterality: N/A;   Family History  Problem Relation Age of Onset  . Hyperlipidemia Father   . Kidney disease Father   . Heart disease Father   . Congestive Heart Failure Mother   . Heart disease Mother   . Hyperlipidemia Daughter   . Hyperlipidemia Son   . Diabetes Son   . Anxiety disorder Daughter   . Depression Daughter   . Arthritis Daughter    Social History   Socioeconomic History  . Marital status: Married    Spouse  name: Not on file  . Number of children: 4  . Years of education: Not on file  . Highest education level: High school graduate  Occupational History  . Occupation: retired  Engineer, production  . Financial resource strain: Somewhat hard  . Food insecurity:    Worry: Sometimes true    Inability: Sometimes true  . Transportation needs:    Medical: No    Non-medical: No  Tobacco Use  . Smoking status: Former Smoker    Types: Cigarettes, Cigars    Last attempt to quit: 1990    Years since quitting: 29.9  . Smokeless tobacco: Never Used  . Tobacco comment: Smoked afternoon cigar  Substance and Sexual Activity  . Alcohol use: No  . Drug use: No  . Sexual activity: Yes  Lifestyle  . Physical activity:    Days per week: 0 days    Minutes per session: 0 min  . Stress: Not at all  Relationships  . Social connections:    Talks on phone: More than three times a week    Gets together: Once a week    Attends religious service: More than 4 times per year    Active member of club or organization: No    Attends meetings of clubs or organizations: Never    Relationship status: Married  Other Topics Concern  . Not on file  Social History Narrative  . Not on file   Tobacco Counseling Counseling given: Not Answered Comment: Smoked afternoon cigar   Clinical Intake:  Pre-visit preparation completed: Yes  Pain : No/denies pain     Nutritional Status: BMI > 30  Obese Nutritional Risks: None Diabetes: Yes CBG done?: No Did pt. bring in CBG monitor from home?: No   Nutrition Risk Assessment:  Has the patient had any N/V/D within the last 2 months?  No  Does the patient have any non-healing wounds?  No  Has the patient had any unintentional weight loss or weight gain?  No   Diabetes:  Is the patient diabetic?  Yes  If diabetic, was a CBG obtained today?  No  Did the patient bring in their glucometer from home?  No  How often do you monitor your CBG's? Daily fasting in the  morning .   Financial Strains and Diabetes Management:  Are you having any financial strains with the device, your supplies or your medication? No .  Does the patient want to be seen by Chronic Care Management for management of their diabetes?  No  Would the patient like to be referred to a Nutritionist or for Diabetic Management?  No   Diabetic Exams:  Diabetic Eye Exam: Completed 11/18/17 negative for retinopathy.  Diabetic Foot Exam: Completed 11/30/17.   How often do you need to have someone help you when you read instructions, pamphlets, or other  written materials from your doctor or pharmacy?: 1 - Never What is the last grade level you completed in school?: 12th grade  Interpreter Needed?: No  Information entered by :: Reather Littler LPN  Activities of Daily Living In your present state of health, do you have any difficulty performing the following activities: 01/27/2018 11/30/2017  Hearing? N N  Comment pt declines hearing aids -  Vision? N N  Difficulty concentrating or making decisions? N N  Walking or climbing stairs? N N  Dressing or bathing? N N  Doing errands, shopping? N N  Preparing Food and eating ? N -  Using the Toilet? N -  In the past six months, have you accidently leaked urine? N -  Do you have problems with loss of bowel control? N -  Managing your Medications? N -  Managing your Finances? N -  Housekeeping or managing your Housekeeping? N -  Some recent data might be hidden     Immunizations and Health Maintenance Immunization History  Administered Date(s) Administered  . Influenza, High Dose Seasonal PF 11/06/2014, 11/24/2015, 11/30/2016, 11/30/2017   There are no preventive care reminders to display for this patient.  Patient Care Team: Lada, Janit Bern, MD as PCP - General (Family Medicine) End, Cristal Deer, MD as PCP - Cardiology (Cardiology)  Indicate any recent Medical Services you may have received from other than [facility] providers in the past  year (date may be approximate).    Assessment:   This is a routine wellness examination for Drew [name].  Hearing/Vision screen Hearing Screening Comments: Pt has no difficulty hearing Vision Screening Comments: Vision screen[facility] Dr. Verlon Setting - pt had bilateral cataract removal done Sept 2019  Dietary issues and exercise activities discussed: Current Exercise Habits: Home exercise routine, Type of exercise: walking, Time (Minutes): 30, Frequency (Times/Week): 3, Weekly Exercise (Minutes/Week): 90, Intensity: Mild, Exercise limited by: None identified  Goals    . Prevent falls     Pt advised to install railing outside home for steps and grab bars in the shower.       Depression Screen PHQ 2/9 Scores 01/27/2018 11/30/2017 11/30/2017 08/30/2017  PHQ - 2 Score 0 0 0 0  PHQ- 9 Score 3 0 - -    Fall Risk Fall Risk  01/27/2018 11/30/2017 11/30/2017 08/30/2017 05/31/2017  Falls in the past year? 1 Yes No No No  Number falls in past yr: 1 1 - - -  Comment tripped in yard - - - -  Injury with Fall? 0 No - - -  Follow up Falls evaluation completed;Falls prevention discussed - - - -    FALL RISK PREVENTION PERTAINING TO THE HOME:  Any stairs in or around the home WITH handrails? Yes  - 3 steps outside with no handrail Home free of loose throw rugs in walkways, pet beds, electrical cords, etc? Yes  Adequate lighting in your home to reduce risk of falls? Yes   ASSISTIVE DEVICES UTILIZED TO PREVENT FALLS:  Life alert? No  Use of a cane, walker or w/c? No  Grab bars in the bathroom? No  Shower chair or bench in shower? No  Elevated toilet seat or a handicapped toilet? No   DME ORDERS:  DME order needed?  No   TIMED UP AND GO:  Was the test performed? Yes .  Length of time to ambulate 10 feet: 6 sec.   GAIT:  Appearance of gait: Gait stead-fast and without the use of an  assistive device.   Education: Fall risk prevention has been discussed.  Intervention(s) required?  Yes  - needs handrail outside  Cognitive Function:     6CIT Screen 01/27/2018  What Year? 0 points  What month? 0 points  What time? 0 points  Count back from 20 0 points  Months in reverse 0 points  Repeat phrase 2 points  Total Score 2    Screening Tests Health Maintenance  Topic Date Due  . TETANUS/TDAP  08/31/2018 (Originally 07/10/1959)  . HEMOGLOBIN A1C  06/01/2018  . OPHTHALMOLOGY EXAM  11/19/2018  . FOOT EXAM  12/01/2018  . INFLUENZA VACCINE  Completed  . PNA vac Low Risk Adult  Addressed    Qualifies for Shingles Vaccine? Yes  . Due for Shingrix. Education has been provided regarding the importance of this vaccine. Pt has been advised to call insurance company to determine out of pocket expense. Advised may also receive vaccine at local pharmacy or Health Dept. Verbalized acceptance and understanding.  Tdap: Although this vaccine is not a covered service during a Wellness Exam, does the patient still wish to receive this vaccine today?  No .  Education has been provided regarding the importance of this vaccine. Advised may receive this vaccine at local pharmacy or Health Dept. Aware to provide a copy of the vaccination record if obtained from local pharmacy or Health Dept. Verbalized acceptance and understanding.  Flu Vaccine: Up to date  Pneumococcal Vaccine: Up to date   Cancer Screenings:  Colorectal Screening: No longer required.   Lung Cancer Screening: (Low Dose CT Chest recommended if Age 49-80 years, 30 pack-year currently smoking OR have quit w/in 15years.) does not qualify.    Additional Screening:  Hepatitis C Screening: no longer required  Vision Screening: Recommended annual ophthalmology exams for early detection of glaucoma and other disorders of the eye. Is the patient up to date with their annual eye exam?  Yes  Who is the provider or what is the name of the office in which the pt attends annual eye exams? Dr[facility]  Dental  Screening: Recommended annual dental exams for proper oral hygiene  Community Resource Referral:  CRR required this visit?  No       Plan:    I have personally reviewed and addressed the Medicare Annual Wellness questionnaire and have noted the following in the patient's chart:  A. Medical and social history B. Use of alcohol, tobacco or illicit drugs  C. Current medications and supplements D. Functional ability and status E.  Nutritional status F.  Physical activity G. Advance directives H. List of other physicians I.  Hospitalizations, surgeries, and ER visits in previous 12 months J.  Vitals K. Screenings such as hearing and vision if needed, cognitive and depression L. Referrals and appointments   In addition, I have reviewed and discussed with patient certain preventive protocols, quality metrics, and best practice recommendations. A written personalized care plan for preventive services as well as general preventive health recommendations were provided to patient.   Signed,  Reather Littler, LPN Nurse Health Advisor   Nurse Notes: pt and his wife presented at the same time today for wellness visits. Overall he is doing well, good blood sugar control with fasting blood sugar on average of 116. He has increased his water intake and physical activity. Pt and his wife state they are having financial difficulties with bills currently but they are getting by and declined a [facility] team referral at this  time.

## 2018-02-03 ENCOUNTER — Encounter: Payer: Self-pay | Admitting: Nurse Practitioner

## 2018-02-03 NOTE — Telephone Encounter (Signed)
This encounter was created in error - please disregard.

## 2018-02-24 ENCOUNTER — Other Ambulatory Visit: Payer: Self-pay

## 2018-02-24 MED ORDER — LIRAGLUTIDE 18 MG/3ML ~~LOC~~ SOPN: 1.8000 mg | PEN_INJECTOR | Freq: Every day | SUBCUTANEOUS | 0 refills | Status: DC

## 2018-03-02 ENCOUNTER — Ambulatory Visit (INDEPENDENT_AMBULATORY_CARE_PROVIDER_SITE_OTHER): Payer: Medicare Other | Admitting: Family Medicine

## 2018-03-02 ENCOUNTER — Encounter: Payer: Self-pay | Admitting: Family Medicine

## 2018-03-02 VITALS — BP 120/76 | HR 75 | Temp 98.0°F | Ht 68.0 in | Wt 225.6 lb

## 2018-03-02 DIAGNOSIS — L309 Dermatitis, unspecified: Secondary | ICD-10-CM | POA: Diagnosis not present

## 2018-03-02 DIAGNOSIS — I10 Essential (primary) hypertension: Secondary | ICD-10-CM

## 2018-03-02 DIAGNOSIS — N183 Chronic kidney disease, stage 3 unspecified: Secondary | ICD-10-CM

## 2018-03-02 DIAGNOSIS — E1169 Type 2 diabetes mellitus with other specified complication: Secondary | ICD-10-CM

## 2018-03-02 DIAGNOSIS — E785 Hyperlipidemia, unspecified: Secondary | ICD-10-CM

## 2018-03-02 DIAGNOSIS — I5032 Chronic diastolic (congestive) heart failure: Secondary | ICD-10-CM

## 2018-03-02 DIAGNOSIS — E669 Obesity, unspecified: Secondary | ICD-10-CM

## 2018-03-02 MED ORDER — TRIAMCINOLONE ACETONIDE 0.1 % EX CREA: 1.0000 "application " | TOPICAL_CREAM | Freq: Two times a day (BID) | CUTANEOUS | 0 refills | Status: DC | PRN

## 2018-03-02 NOTE — Patient Instructions (Addendum)
Try to use more spices instead of salt Check out the information at familydoctor.org entitled "Nutrition for Weight Loss: What You Need to Know about Fad Diets" Try to lose between 1-2 pounds per week by taking in fewer calories and burning off more calories You can succeed by limiting portions, limiting foods dense in calories and fat, becoming more active, and drinking 8 glasses of water a day (64 ounces) Don't skip meals, especially breakfast, as skipping meals may alter your metabolism Do not use over-the-counter weight loss pills or gimmicks that claim rapid weight loss A healthy BMI (or body mass index) is between 18.5 and 24.9 You can calculate your ideal BMI at the NIH website [employer].hu We'll get fasting labs at your next visit Try to limit saturated fats in your diet (bologna, hot dogs, barbeque, cheeseburgers, hamburgers, steak, bacon, sausage, cheese, etc.) and get more fresh fruits, vegetables, and whole grains   Obesity, Adult Obesity is the condition of having too much total body fat. Being overweight or obese means that your weight is greater than what is considered healthy for your body size. Obesity is determined by a measurement called BMI. BMI is an estimate of body fat and is calculated from height and weight. For adults, a BMI of 30 or higher is considered obese. Obesity can eventually lead to other health concerns and major illnesses, including:  Stroke.  Coronary artery disease (CAD).  Type 2 diabetes.  Some types of cancer, including cancers of the colon, breast, uterus, and gallbladder.  Osteoarthritis.  High blood pressure (hypertension).  High cholesterol.  Sleep apnea.  Gallbladder stones.  Infertility problems. What are the causes? The main cause of obesity is taking in (consuming) more calories than your body uses for energy. Other factors that contribute to this condition may include:  Being  born with genes that make you more likely to become obese.  Having a medical condition that causes obesity. These conditions include: ? Hypothyroidism. ? Polycystic ovarian syndrome (PCOS). ? Binge-eating disorder. ? Cushing syndrome.  Taking certain medicines, such as steroids, antidepressants, and seizure medicines.  Not being physically active (sedentary lifestyle).  Living where there are limited places to exercise safely or buy healthy foods.  Not getting enough sleep. What increases the risk? The following factors may increase your risk of this condition:  Having a family history of obesity.  Being a woman of African-American descent.  Being a man of Hispanic descent. What are the signs or symptoms? Having excessive body fat is the main symptom of this condition. How is this diagnosed? This condition may be diagnosed based on:  Your symptoms.  Your medical history.  A physical exam. Your health care provider may measure: ? Your BMI. If you are an adult with a BMI between 25 and less than 30, you are considered overweight. If you are an adult with a BMI of 30 or higher, you are considered obese. ? The distances around your hips and your waist (circumferences). These may be compared to each other to help diagnose your condition. ? Your skinfold thickness. Your health care provider may gently pinch a fold of your skin and measure it. How is this treated? Treatment for this condition often includes changing your lifestyle. Treatment may include some or all of the following:  Dietary changes. Work with your health care provider and a dietitian to set a weight-loss goal that is healthy and reasonable for you. Dietary changes may include eating: ? Smaller portions. A portion size is  the amount of a particular food that is healthy for you to eat at one time. This varies from person to person. ? Low-calorie or low-fat options. ? More whole grains, fruits, and  vegetables.  Regular physical activity. This may include aerobic activity (cardio) and strength training.  Medicine to help you lose weight. Your health care provider may prescribe medicine if you are unable to lose 1 pound a week after 6 weeks of eating more healthily and doing more physical activity.  Surgery. Surgical options may include gastric banding and gastric bypass. Surgery may be done if: ? Other treatments have not helped to improve your condition. ? You have a BMI of 40 or higher. ? You have life-threatening health problems related to obesity. Follow these instructions at home:  Eating and drinking   Follow recommendations from your health care provider about what you eat and drink. Your health care provider may advise you to: ? Limit fast foods, sweets, and processed snack foods. ? Choose low-fat options, such as low-fat milk instead of whole milk. ? Eat 5 or more servings of fruits or vegetables every day. ? Eat at home more often. This gives you more control over what you eat. ? Choose healthy foods when you eat out. ? Learn what a healthy portion size is. ? Keep low-fat snacks on hand. ? Avoid sugary drinks, such as soda, fruit juice, iced tea sweetened with sugar, and flavored milk. ? Eat a healthy breakfast.  Drink enough water to keep your urine clear or pale yellow.  Do not go without eating for long periods of time (do not fast) or follow a fad diet. Fasting and fad diets can be unhealthy and even dangerous. Physical Activity  Exercise regularly, as told by your health care provider. Ask your health care provider what types of exercise are safe for you and how often you should exercise.  Warm up and stretch before being active.  Cool down and stretch after being active.  Rest between periods of activity. Lifestyle  Limit the time that you spend in front of your TV, computer, or video game system.  Find ways to reward yourself that do not involve  food.  Limit alcohol intake to no more than 1 drink a day for nonpregnant women and 2 drinks a day for men. One drink equals 12 oz of beer, 5 oz of wine, or 1 oz of hard liquor. General instructions  Keep a weight loss journal to keep track of the food you eat and how much you exercise you get.  Take over-the-counter and prescription medicines only as told by your health care provider.  Take vitamins and supplements only as told by your health care provider.  Consider joining a support group. Your health care provider may be able to recommend a support group.  Keep all follow-up visits as told by your health care provider. This is important. Contact a health care provider if:  You are unable to meet your weight loss goal after 6 weeks of dietary and lifestyle changes. This information is not intended to replace advice given to you by your health care provider. Make sure you discuss any questions you have with your health care provider. Document Released: 03/18/2004 Document Revised: 07/14/2015 Document Reviewed: 11/27/2014 Elsevier Interactive Patient Education  2019 Elsevier Inc.  Preventing Unhealthy [employer] Morgan EnergyWeight Gain, Adult Staying at a healthy weight is important to your overall health. When fat builds up in your body, you may become overweight or obese. Being overweight  or obese increases your risk of developing certain health problems, such as heart disease, diabetes, sleeping problems, joint problems, and some types of cancer. Unhealthy weight gain is often the result of making unhealthy food choices or not getting enough exercise. You can make changes to your lifestyle to prevent obesity and stay as healthy as possible. What nutrition changes can be made?   Eat only as much as your body needs. To do this: ? Pay attention to signs that you are hungry or full. Stop eating as soon as you feel full. ? If you feel hungry, try drinking water first before eating. Drink enough water so your  urine is clear or pale yellow. ? Eat smaller portions. Pay attention to portion sizes when eating out. ? Look at serving sizes on food labels. Most foods contain more than one serving per container. ? Eat the recommended number of calories for your gender and activity level. For most active people, a daily total of 2,000 calories is appropriate. If you are trying to lose weight or are not very active, you may need to eat fewer calories. Talk with your health care provider or a diet and nutrition specialist (dietitian) about how many calories you need each day.  Choose healthy foods, such as: ? Fruits and vegetables. At each meal, try to fill at least half of your plate with fruits and vegetables. ? Whole grains, such as whole-wheat bread, brown rice, and quinoa. ? Lean meats, such as chicken or fish. ? Other healthy proteins, such as beans, eggs, or tofu. ? Healthy fats, such as nuts, seeds, fatty fish, and olive oil. ? Low-fat or fat-free dairy products.  Check food labels, and avoid food and drinks that: ? Are high in calories. ? Have added sugar. ? Are high in sodium. ? Have saturated fats or trans fats.  Cook foods in healthier ways, such as by baking, broiling, or grilling.  Make a meal plan for the week, and shop with a grocery list to help you stay on track with your purchases. Try to avoid going to the grocery store when you are hungry.  When grocery shopping, try to shop around the outside of the store first, where the fresh foods are. Doing this helps you to avoid prepackaged foods, which can be high in sugar, salt (sodium), and fat. What lifestyle changes can be made?   Exercise for 30 or more minutes on 5 or more days each week. Exercising may include brisk walking, yard work, biking, running, swimming, and team sports like basketball and soccer. Ask your health care provider which exercises are safe for you.  Do muscle-strengthening activities, such as lifting weights or  using resistance bands, on 2 or more days a week.  Do not use any products that contain nicotine or tobacco, such as cigarettes and e-cigarettes. If you need help quitting, ask your health care provider.  Limit alcohol intake to no more than 1 drink a day for nonpregnant women and 2 drinks a day for men. One drink equals 12 oz of beer, 5 oz of wine, or 1 oz of hard liquor.  Try to get 7-9 hours of sleep each night. What other changes can be made?  Keep a food and activity journal to keep track of: ? What you ate and how many calories you had. Remember to count the calories in sauces, dressings, and side dishes. ? Whether you were active, and what exercises you did. ? Your calorie, weight, and activity goals.  Check your weight regularly. Track any changes. If you notice you have gained weight, make changes to your diet or activity routine.  Avoid taking weight-loss medicines or supplements. Talk to your health care provider before starting any new medicine or supplement.  Talk to your health care provider before trying any new diet or exercise plan. Why are these changes important? Eating healthy, staying active, and having healthy habits can help you to prevent obesity. Those changes also:  Help you manage stress and emotions.  Help you connect with friends and family.  Improve your self-esteem.  Improve your sleep.  Prevent long-term health problems. What can happen if changes are not made? Being obese or overweight can cause you to develop joint or bone problems, which can make it hard for you to stay active or do activities you enjoy. Being obese or overweight also puts stress on your heart and lungs and can lead to health problems like diabetes, heart disease, and some cancers. Where to find more information Talk with your health care provider or a dietitian about healthy eating and healthy lifestyle choices. You may also find information from:  U.[place]. Department of  Agriculture, MyPlate: https://ball-collins.biz/  American Heart Association: www.heart.org  Centers for Disease Control and Prevention: FootballExhibition.com.br Summary  Staying at a healthy weight is important to your overall health. It helps you to prevent certain diseases and health problems, such as heart disease, diabetes, joint problems, sleep disorders, and some types of cancer.  Being obese or overweight can cause you to develop joint or bone problems, which can make it hard for you to stay active or do activities you enjoy.  You can prevent unhealthy weight gain by eating a healthy diet, exercising regularly, not smoking, limiting alcohol, and getting enough sleep.  Talk with your health care provider or a dietitian for guidance about healthy eating and healthy lifestyle choices. This information is not intended to replace advice given to you by your health care provider. Make sure you discuss any questions you have with your health care provider. Document Released: 02/10/2016 Document Revised: 11/19/2016 Document Reviewed: 03/17/2016 Elsevier Interactive Patient Education  2019 [employer].

## 2018-03-02 NOTE — Assessment & Plan Note (Signed)
Discussed last lipids; he wants to skip labs this time and check next time; cardiologist was okay with lipids last visit per patient; cut down on starchy foods and fried foods; weight loss should help raise the HDL; low HDL risk of heart attack

## 2018-03-02 NOTE — Assessment & Plan Note (Signed)
Really encouraged patient to lose weight

## 2018-03-02 NOTE — Assessment & Plan Note (Signed)
Really work on avoiding salt; weigh self daily and contact cardiologist if gain of 2 or more pounds over night or 5 or more pounds in a week

## 2018-03-02 NOTE — Progress Notes (Signed)
BP 120/76   Pulse 75   Temp 98 F (36.7 C) (Oral)   Ht 5\' 8"  (1.727 m)   Wt 225 lb 9.6 oz (102.3 kg)   SpO2 94%   BMI 34.30 kg/m    Subjective:    Patient ID: Drew [name], male    DOB: [birth year], 78 y.o.   MRN: [id number]  HPI: Drew [name] is a 78 y.o. male  Chief Complaint  Patient presents with  . Follow-up    HPI Here for f/u Has an itchy area on the lower right leg; tried some moisturizer; scratches and gets irritated; going on a week or so  Type 2 diabetes; he thinks numbers are going to be better; walking more Lab Results  Component Value Date   HGBA1C 5.9 (H) 11/30/2017   Obesity; weight is stuck right now but he'd like to lose some; he thinks 185 pounds would be better  HTN; well-controlled; used to check BP daily but not now; not adding much salt to his food but tastes bland without the salt  High cholesterol; his LDL was too high and he was supposed to contact Dr. Okey DupreEnd about this; he said everything was "good" and did EKG; no changes to medicine  Lab Results  Component Value Date   CHOL 144 11/30/2017   HDL 29 (L) 11/30/2017   LDLCALC 82 11/30/2017   TRIG 238 (H) 11/30/2017   CHOLHDL 5.0 (H) 11/30/2017   CHF; not weighing self daily; has a scale; some SHOB, feet are a little swollen, comes and goes, maybe related to salt  CKD stage 3, seeing nephrologist; avoiding NSAIDs; good water drinker  Depression screen [facility] 2/9 03/02/2018 01/27/2018 11/30/2017 11/30/2017 08/30/2017  Decreased Interest 0 0 0 0 0  Down, Depressed, Hopeless 0 0 0 0 0  PHQ - 2 Score 0 0 0 0 0  Altered sleeping 0 2 0 - -  Tired, decreased energy 0 1 0 - -  Change in appetite 0 0 0 - -  Feeling bad or failure about yourself  0 0 0 - -  Trouble concentrating 0 0 0 - -  Moving slowly or fidgety/restless 0 0 0 - -  Suicidal thoughts 0 0 0 - -  PHQ-9 Score 0 3 0 - -  Difficult doing work/chores - - Not difficult at all - -   Fall Risk  03/02/2018 01/27/2018 11/30/2017 11/30/2017 08/30/2017   Falls in the past year? 0 1 Yes No No  Number falls in past yr: 0 1 1 - -  Comment - tripped in yard - - -  Injury with Fall? 0 0 No - -  Follow up - Falls evaluation completed;Falls prevention discussed - - -    Relevant past medical, surgical, family and social history reviewed Past Medical History:  Diagnosis Date  . (HFpEF) heart failure with preserved ejection fraction (HCC)    a. 02/2016 Echo: EF nl. Gr1 DD. Mild AI. Mod thickened AoV w/o stenosis. Nl RV size and fxn.  . Chronic kidney disease, stage III (moderate) (HCC) 08/15/2015  . Diabetes mellitus with complication (HCC)   . GERD (gastroesophageal reflux disease)   . H/O: upper GI bleed 2015  . History of stress test    a. 05/2016 Lexiscan MV: EF 72%, No ischemia-->Low risk.  Marland Kitchen. Hypercholesteremia   . Hypertension   . Obesity (BMI 30-39.9) 08/07/2015  . Peripheral vascular disease (HCC) 12/26/2015   Past Surgical History:  Procedure Laterality Date  .  CATARACT EXTRACTION, BILATERAL Bilateral 11/18/2017  . COLONOSCOPY N/A 12/27/2013   Procedure: COLONOSCOPY;  Surgeon: Barrie FolkJohn C Hayes, MD;  [facility];  Service: Endoscopy;  Laterality: N/A;  . ENTEROSCOPY N/A 12/21/2013   Procedure: ENTEROSCOPY;  Surgeon: Vertell NovakJames L Edwards Jr., MD;  [facility];  Service: Endoscopy;  Laterality: N/A;  . ENTEROSCOPY N/A 12/27/2013   Procedure: ENTEROSCOPY;  Surgeon: Barrie FolkJohn C Hayes, MD;  [facility];  Service: Endoscopy;  Laterality: N/A;   Family History  Problem Relation Age of Onset  . Hyperlipidemia Father   . Kidney disease Father   . Heart disease Father   . Congestive Heart Failure Mother   . Heart disease Mother   . Hyperlipidemia Daughter   . Hyperlipidemia Son   . Diabetes Son   . Anxiety disorder Daughter   . Depression Daughter   . Arthritis Daughter    Social History   Tobacco Use  . Smoking status: Former Smoker    Types: Cigarettes, Cigars    Last attempt to quit: 1990    Years since  quitting: 30.0  . Smokeless tobacco: Never Used  . Tobacco comment: Smoked afternoon cigar  Substance Use Topics  . Alcohol use: No  . Drug use: No     [facility]  AUDIT-C Score  0      Interim medical history since last visit reviewed. Allergies and medications reviewed  Review of Systems Per HPI unless specifically indicated above     Objective:    BP 120/76   Pulse 75   Temp 98 F (36.7 C) (Oral)   Ht 5\' 8"  (1.727 m)   Wt 225 lb 9.6 oz (102.3 kg)   SpO2 94%   BMI 34.30 kg/m   Wt Readings from Last 3 Encounters:  03/02/18 225 lb 9.6 oz (102.3 kg)  01/27/18 222 lb 14.4 oz (101.1 kg)  12/01/17 225 lb 8 oz (102.3 kg)    Physical Exam Constitutional:      General: He is not in acute distress.    Appearance: He is well-developed.  HENT:     Head: Normocephalic and atraumatic.  Eyes:     General: No scleral icterus. Neck:     Thyroid: No thyromegaly.  Cardiovascular:     Rate and Rhythm: Normal rate and regular rhythm.  Pulmonary:     Effort: Pulmonary effort is normal.     Breath sounds: Normal breath sounds.  Abdominal:     General: Bowel sounds are normal. There is no distension.     Palpations: Abdomen is soft.  Skin:    General: Skin is warm and dry.     Coloration: Skin is not pale.     Comments: Mild erythema on the medial right lower leg  Neurological:     Coordination: Coordination normal.  Psychiatric:        Behavior: Behavior normal.        Thought Content: Thought content normal.        Judgment: Judgment normal.    Diabetic Foot Form - Detailed   Diabetic Foot Exam - detailed Diabetic Foot exam was performed with the following findings:  Yes 03/02/2018 11:07 AM  Visual Foot Exam completed.:  Yes  Pulse Foot Exam completed.:  Yes  Right Dorsalis Pedis:  Present Left Dorsalis Pedis:  Present  Sensory Foot Exam Completed.:  Yes Semmes-Weinstein Monofilament Test R Site 1-Great Toe:  Pos L  Site 1-Great Toe:  Pos        Results for orders placed or performed in visit on 12/01/17  HM DIABETES EYE EXAM  Result Value Ref Range   HM Diabetic Eye Exam No Retinopathy No Retinopathy      Assessment & Plan:   Problem List Items Addressed This Visit      Cardiovascular and Mediastinum   Essential hypertension (Chronic)    Well-controlled; avoid salt and use spices to flavor foods; working on weight encouraged, with enough weight loss, we should be able to decrease medicine      Chronic heart failure with preserved ejection fraction (HCC)    Really work on avoiding salt; weigh self daily and contact cardiologist if gain of 2 or more pounds over night or 5 or more pounds in a week        Endocrine   Diabetes mellitus type 2 in obese (HCC)    Foot exam by MD today; encouraged weigh tloss as the key to preventing this from getting worse        Genitourinary   Chronic kidney disease, stage III (moderate) (HCC) (Chronic)    Followed by nephrologist; avoid NSAIDs; stay hydrated        Other   Obesity (BMI 30-39.9) (Chronic)    Really encouraged patient to lose weight      Dyslipidemia (high LDL; low HDL)    Discussed last lipids; he wants to skip labs this time and check next time; cardiologist was okay with lipids last visit per patient; cut down on starchy foods and fried foods; weight loss should help raise the HDL; low HDL risk of heart attack       Other Visit Diagnoses    Dermatitis    -  Primary   TAC prescribed if needed       Follow up plan: Return in about 3 months (around 06/01/2018) for follow-up visit with Dr. Sherie Don.  An after-visit summary was printed and given to the patient at check-out.  Please see the patient instructions which may contain other information and recommendations beyond what is mentioned above in the assessment and plan.  Meds ordered this encounter  Medications  . triamcinolone cream (KENALOG) 0.1 %    Sig: Apply 1 application  topically 2 (two) times daily as needed.    Dispense:  30 g    Refill:  0    No orders of the defined types were placed in this encounter.

## 2018-03-02 NOTE — Assessment & Plan Note (Signed)
Foot exam by MD today; encouraged weigh tloss as the key to preventing this from getting worse

## 2018-03-02 NOTE — Assessment & Plan Note (Signed)
Well-controlled; avoid salt and use spices to flavor foods; working on weight encouraged, with enough weight loss, we should be able to decrease medicine

## 2018-03-02 NOTE — Assessment & Plan Note (Signed)
Followed by nephrologist; avoid NSAIDs; stay hydrated

## 2018-03-06 DIAGNOSIS — I10 Essential (primary) hypertension: Secondary | ICD-10-CM | POA: Diagnosis not present

## 2018-03-06 DIAGNOSIS — G4733 Obstructive sleep apnea (adult) (pediatric): Secondary | ICD-10-CM | POA: Diagnosis not present

## 2018-03-19 ENCOUNTER — Other Ambulatory Visit: Payer: Self-pay | Admitting: Internal Medicine

## 2018-03-19 ENCOUNTER — Other Ambulatory Visit: Payer: Self-pay | Admitting: Physician Assistant

## 2018-03-27 ENCOUNTER — Telehealth: Payer: Self-pay

## 2018-03-27 NOTE — Telephone Encounter (Signed)
Drug rep is no longer giving out victoza samples and he cannot afford it with his insurance. What would you recommend?

## 2018-03-29 NOTE — Telephone Encounter (Signed)
I called patient to find out how much Victoza he has; about 3 days left The new pens will be just once a WEEK He'll come by tomorrow after his appointment and we'll give him new pens, based on what samples we have in the fridge at the time, but it will be once a WEEK, start after he finishes Victoza

## 2018-03-30 ENCOUNTER — Encounter: Payer: Self-pay | Admitting: Pulmonary Disease

## 2018-03-30 ENCOUNTER — Ambulatory Visit: Payer: Medicare Other | Admitting: Pulmonary Disease

## 2018-03-30 VITALS — BP 130/78 | HR 67 | Ht 68.0 in | Wt 226.0 lb

## 2018-03-30 DIAGNOSIS — G4733 Obstructive sleep apnea (adult) (pediatric): Secondary | ICD-10-CM | POA: Diagnosis not present

## 2018-03-30 DIAGNOSIS — E668 Other obesity: Secondary | ICD-10-CM

## 2018-03-30 NOTE — Patient Instructions (Signed)
Continue CPAP with sleep as prescribed  Any problems with mask leaking should be addressed with [employer]  Continue working on weight loss as we discussed  Follow-up in 1 year.  Call sooner if needed for any sleep or breathing problems

## 2018-03-30 NOTE — Progress Notes (Signed)
PULMONARY/SLEEP OFFICE FOLLOW-UP NOTE  Requesting MD/Service: Yvonne Kendall, MD Date of initial consultation: 08/31 Reason for consultation: Snoring, obesity, suspected OSA  PT PROFILE: 78 y.o. male former smoker referred for evaluation of fatigue and daytime sleepiness  DATA: 03/17/16 echocardiogram: Grade 1 diastolic dysfunction. Otherwise normal. 11/11/16 PSG: AHI 29.5/hr. AutoSet 5-20 cm H2O recommended 01/06-02/04/20 CPAP compliance: Usage 30/30.  >4 hours: 30/30.  Median pressure 6.7 cm H2O.  Mean AHI 1.3/hour.  SUBJ:  This is a scheduled follow-up.  He is using CPAP compliantly.  He believes it is very beneficial.  He denies morning headache and daytime hypersomnolence.  He has occasional problems with mask leak.  This has occurred when [facility] provides him with a different mask.  His current mask is working well.  He has no new complaints.   Vitals:   03/30/18 0920 03/30/18 0928  BP:  130/78  Pulse:  67  SpO2:  94%  Weight: 226 lb (102.5 kg)   Height: 5\' 8"  (1.727 m)      EXAM:  Gen: Moderately obese, NAD HEENT: NCAT, sclera white Neck: No JVD Lungs: breath sounds full, no wheezes or other adventitious sounds Cardiovascular: RRR, no murmurs Abdomen: Soft, nontender, normal BS Ext: without clubbing, cyanosis, edema Neuro: grossly intact Skin: Limited exam, no lesions noted   DATA:   BMP Latest Ref Rng & Units 11/14/2017 08/30/2017 07/04/2017  Glucose 70 - 99 mg/dL 374(M) 270(B) 90  BUN 8 - 23 mg/dL 13 17 13   Creatinine 0.61 - 1.24 mg/dL 8.67 5.44(B) 2.01(E)  BUN/Creat Ratio 6 - 22 (calc) - 12 10  Sodium 135 - 145 mmol/L 140 139 140  Potassium 3.5 - 5.1 mmol/L 3.8 4.3 4.6  Chloride 98 - 111 mmol/L 108 105 104  CO2 22 - 32 mmol/L 25 24 26   Calcium 8.9 - 10.3 mg/dL 9.3 9.8 9.8    CBC Latest Ref Rng & Units 08/30/2017 02/09/2016 12/28/2013  WBC 3.8 - 10.8 Thousand/uL 6.3 5.9 -  Hemoglobin 13.2 - 17.1 g/dL 07.1 21.9 7.5(O)  Hematocrit 38.5 - 50.0 % 43.9 48.2  26.5(L)  Platelets 140 - 400 Thousand/uL 149 187 -    CXR:  No recent film  IMPRESSION:     ICD-10-CM   1. OSA (obstructive sleep apnea) G47.33   2. Moderate obesity E66.8      PLAN:  Continue CPAP with sleep on current settings of 5-20 cm H2O Any problems with mask leak in the future is to be first addressed with [facility] Continue efforts at weight loss.  We discussed dietary strategies Follow up in 1 year.  Call sooner if needed  Billy Fischer, MD PCCM service Mobile [phone] Pager [phone] 03/30/2018 9:47 AM

## 2018-03-31 ENCOUNTER — Telehealth: Payer: Self-pay | Admitting: Family Medicine

## 2018-03-31 MED ORDER — DULAGLUTIDE 0.75 MG/0.5ML ~~LOC~~ SOAJ: 0.5000 mL | SUBCUTANEOUS | 0 refills | Status: DC

## 2018-03-31 NOTE — Telephone Encounter (Signed)
I talked with patient After he finishes his Victoza, wait one week before starting new sample If he uses last Victoza dose on Saturday, e.g., start new Trulicity next Saturday He did not have any questions about the new pen

## 2018-04-03 ENCOUNTER — Other Ambulatory Visit: Payer: Self-pay | Admitting: Family Medicine

## 2018-04-03 DIAGNOSIS — E034 Atrophy of thyroid (acquired): Secondary | ICD-10-CM

## 2018-04-03 NOTE — Telephone Encounter (Signed)
Lab Results  Component Value Date   TSH 2.20 05/31/2017

## 2018-04-06 DIAGNOSIS — N261 Atrophy of kidney (terminal): Secondary | ICD-10-CM | POA: Diagnosis not present

## 2018-04-06 DIAGNOSIS — R809 Proteinuria, unspecified: Secondary | ICD-10-CM | POA: Diagnosis not present

## 2018-04-06 DIAGNOSIS — E1129 Type 2 diabetes mellitus with other diabetic kidney complication: Secondary | ICD-10-CM | POA: Diagnosis not present

## 2018-04-06 DIAGNOSIS — N281 Cyst of kidney, acquired: Secondary | ICD-10-CM | POA: Diagnosis not present

## 2018-04-06 DIAGNOSIS — N183 Chronic kidney disease, stage 3 (moderate): Secondary | ICD-10-CM | POA: Diagnosis not present

## 2018-04-11 ENCOUNTER — Other Ambulatory Visit: Payer: Self-pay | Admitting: Nephrology

## 2018-04-11 DIAGNOSIS — N183 Chronic kidney disease, stage 3 unspecified: Secondary | ICD-10-CM

## 2018-04-11 DIAGNOSIS — N281 Cyst of kidney, acquired: Secondary | ICD-10-CM

## 2018-04-17 ENCOUNTER — Ambulatory Visit
Admission: RE | Admit: 2018-04-17 | Discharge: 2018-04-17 | Disposition: A | Discharge status: Home / Self Care | Payer: Medicare Other | Source: Ambulatory Visit | Attending: Nephrology | Admitting: Nephrology

## 2018-04-17 DIAGNOSIS — N281 Cyst of kidney, acquired: Secondary | ICD-10-CM | POA: Diagnosis not present

## 2018-04-17 DIAGNOSIS — N183 Chronic kidney disease, stage 3 unspecified: Secondary | ICD-10-CM

## 2018-05-25 ENCOUNTER — Telehealth: Payer: Self-pay | Admitting: Internal Medicine

## 2018-05-25 NOTE — Telephone Encounter (Signed)
Virtual Visit Pre-Appointment Phone Call  Steps For Call:  1. Confirm consent - "In the setting of the current Covid19 crisis, you are scheduled for a (phone or video) visit with your provider on (date) at (time).  Just as we do with many in-office visits, in order for you to participate in this visit, we must obtain consent.  If you'd like, I can send this to your mychart (if signed up) or email for you to review.  Otherwise, I can obtain your verbal consent now.  All virtual visits are billed to your insurance company just like a normal visit would be.  By agreeing to a virtual visit, we'd like you to understand that the technology does not allow for your provider to perform an examination, and thus may limit your provider's ability to fully assess your condition.  Finally, though the technology is pretty good, we cannot assure that it will always work on either your or our end, and in the setting of a video visit, we may have to convert it to a phone-only visit.  In either situation, we cannot ensure that we have a secure connection.  Are you willing to proceed?"  2. Give patient instructions for WebEx download to smartphone as below if video visit  3. Advise patient to be prepared with any vital sign or heart rhythm information, their current medicines, and a piece of paper and pen handy for any instructions they may receive the day of their visit  4. Inform patient they will receive a phone call 15 minutes prior to their appointment time (may be from unknown caller ID) so they should be prepared to answer  5. Confirm that appointment type is correct in Epic appointment notes (video vs telephone)    TELEPHONE CALL NOTE  Drew [name] has been deemed a candidate for a follow-up tele-health visit to limit community exposure during the Covid-19 pandemic. I spoke with the patient via phone to ensure availability of phone/video source, confirm preferred email & phone number, and discuss  instructions and expectations.  I reminded Drew [name] to be prepared with any vital sign and/or heart rhythm information that could potentially be obtained via home monitoring, at the time of his visit. I reminded Drew [name] to expect a phone call at the time of his visit if his visit.  Did the patient verbally acknowledge consent to treatment? yes  Norman Herrlich 05/25/2018 1:57 PM   DOWNLOADING THE WEBEX SOFTWARE TO SMARTPHONE  - If Apple, go to [employer] and type in WebEx in the search bar. Download [employer], the blue/green circle. The app is free but as with any other app downloads, their phone may require them to verify saved payment information or Apple password. The patient does NOT have to create an account.  - If Android, ask patient to go to [employer] and type in WebEx in the search bar. Download [employer], the blue/green circle. The app is free but as with any other app downloads, their phone may require them to verify saved payment information or Android password. The patient does NOT have to create an account.   CONSENT FOR TELE-HEALTH VISIT - PLEASE REVIEW  I hereby voluntarily request, consent and authorize [facility] and its employed or contracted physicians, physician assistants, nurse practitioners or other licensed health care professionals (the Practitioner), to provide me with telemedicine health care services (the Services") as deemed necessary by the treating Practitioner. I acknowledge  and consent to receive the Services by the Practitioner via telemedicine. I understand that the telemedicine visit will involve communicating with the Practitioner through live audiovisual communication technology and the disclosure of certain medical information by electronic transmission. I acknowledge that I have been given the opportunity to request an in-person assessment or other available alternative prior to the telemedicine visit and am  voluntarily participating in the telemedicine visit.  I understand that I have the right to withhold or withdraw my consent to the use of telemedicine in the course of my care at any time, without affecting my right to future care or treatment, and that the Practitioner or I may terminate the telemedicine visit at any time. I understand that I have the right to inspect all information obtained and/or recorded in the course of the telemedicine visit and may receive copies of available information for a reasonable fee.  I understand that some of the potential risks of receiving the Services via telemedicine include:   Delay or interruption in medical evaluation due to technological equipment failure or disruption;  Information transmitted may not be sufficient (e.g. poor resolution of images) to allow for appropriate medical decision making by the Practitioner; and/or   In rare instances, security protocols could fail, causing a breach of personal health information.  Furthermore, I acknowledge that it is my responsibility to provide information about my medical history, conditions and care that is complete and accurate to the best of my ability. I acknowledge that Practitioner's advice, recommendations, and/or decision may be based on factors not within their control, such as incomplete or inaccurate data provided by me or distortions of diagnostic images or specimens that may result from electronic transmissions. I understand that the practice of medicine is not an exact science and that Practitioner makes no warranties or guarantees regarding treatment outcomes. I acknowledge that I will receive a copy of this consent concurrently upon execution via email to the email address I last provided but may also request a printed copy by calling the office of [place].    I understand that my insurance will be billed for this visit.   I have read or had this consent read to me.  I understand the  contents of this consent, which adequately explains the benefits and risks of the Services being provided via telemedicine.   I have been provided ample opportunity to ask questions regarding this consent and the Services and have had my questions answered to my satisfaction.  I give my informed consent for the services to be provided through the use of telemedicine in my medical care  By participating in this telemedicine visit I agree to the above.

## 2018-05-26 NOTE — Progress Notes (Signed)
Virtual Visit via Telephone Note    Evaluation Performed:  Follow-up visit  This visit type was conducted due to national recommendations for restrictions regarding the COVID-19 Pandemic (e.g. social distancing).  This format is felt to be most appropriate for this patient at this time.  All issues noted in this document were discussed and addressed.  No physical exam was performed (except for noted visual exam findings with Video Visits).  Verbal consent was obtained from the patient.  Date:  05/29/2018   ID:  Drew [name], DOB 1940-03-11, MRN [id number]  Patient Location:  [address] [place] [address]  Provider location:   [place], [place]  PCP:  Drew Passey, MD  Cardiologist:  Drew Kendall, MD  Electrophysiologist:  None   Chief Complaint:  Follow-up HFpEF  History of Present Illness:    Drew [name] is a 78 y.o. male who presents via audio/video conferencing for a telehealth visit today.  He has a history of HFpEF, hypertension, hyperlipidemia, chronic kidney disease stage III, GERD, and obesity.  We are speaking today for follow-up of his chronic shortness of breath.  He was last seen in our office by Drew Givens, NP, in 11/2017.  At that time, he reported feeling relatively well with improved energy and exercise capacity compared with our previous visit in late August.  Much of this was attributed to switching enalapril to losartan.  Today, [name] reports that he continues to feel well with improved energy and breathing.  He notes occasional mild shortness of breath when walking extended distances, particularly when he has been sedentary for a while.  After walking regularly, he notes improvement in his stamina.  He experienced an episode of chest discomfort last week after trying to dig out a tree stump from his yard.  He felt like his entire body was sore, including his chest.  Of note, the pain did not occur while he was exerting himself, only  afterwards.  He notes some dietary indiscretion with associated weight gain and swelling in his feet.  He is trying to limit his sodium now and has noticed some improvement.  He denies palpitations and lightheadedness.  He is tolerating his medications well.  The patient does not have symptoms concerning for COVID-19 infection (fever, chills, cough, or new shortness of breath).    Prior CV studies:   The following studies were reviewed today:  TTE (03/17/2016): Normal LV size with upper limits of normal wall thickness.  LVEF 55-60% with grade 1 diastolic dysfunction.  Aortic sclerosis with mild regurgitation.  Normal RV size and function.  Past Medical History:  Diagnosis Date  . (HFpEF) heart failure with preserved ejection fraction (HCC)    a. 02/2016 Echo: EF nl. Gr1 DD. Mild AI. Mod thickened AoV w/o stenosis. Nl RV size and fxn.  . Chronic kidney disease, stage III (moderate) (HCC) 08/15/2015  . Diabetes mellitus with complication (HCC)   . GERD (gastroesophageal reflux disease)   . H/O: upper GI bleed 2015  . History of stress test    a. 05/2016 Lexiscan MV: EF 72%, No ischemia-->Low risk.  Marland Kitchen Hypercholesteremia   . Hypertension   . Obesity (BMI 30-39.9) 08/07/2015  . Peripheral vascular disease (HCC) 12/26/2015   Past Surgical History:  Procedure Laterality Date  . CATARACT EXTRACTION, BILATERAL Bilateral 11/18/2017  . COLONOSCOPY N/A 12/27/2013   Procedure: COLONOSCOPY;  Surgeon: Barrie Folk, MD;  Location: [facility] ENDOSCOPY;  Service: Endoscopy;  Laterality: N/A;  .  ENTEROSCOPY N/A 12/21/2013   Procedure: ENTEROSCOPY;  Surgeon: Vertell Novak., MD;  Location[facility] END[facility]COPY;  Service: Endoscopy;  Laterality: N/A;  . ENTEROSCOPY N/A 12/27/2013   Procedure: ENTEROSCOPY;  Surgeon: Barrie Folk, MD;  Location: [facility] ENDOSCOPY;  Service: Endoscopy;  Laterality: N/A;     Current Meds  Medication Sig  . allopurinol (ZYLOPRIM) 100 MG tablet Take 100 mg by mouth 2 (two) times daily.  Marland Kitchen  amLODipine (NORVASC) 5 MG tablet TAKE 1 TABLET BY MOUTH  DAILY  . aspirin EC 81 MG tablet Take 81 mg by mouth daily.  . carvedilol (COREG) 3.125 MG tablet TAKE 1 TABLET BY MOUTH TWO  TIMES DAILY  . Coenzyme Q10 (CO Q 10) 100 MG CAPS Take 100 mg by mouth daily.  . Dulaglutide (TRULICITY) 0.75 MG/0.5ML SOPN Inject 0.5 mLs into the skin once a week.  . famotidine (PEPCID) 20 MG tablet Take 20 mg by mouth daily.   . hydrochlorothiazide (MICROZIDE) 12.5 MG capsule Take 12.5 mg by mouth 3 (three) times a week.  . levothyroxine (SYNTHROID, LEVOTHROID) 25 MCG tablet TAKE 1 TABLET BY MOUTH  EVERY MORNING  . losartan (COZAAR) 25 MG tablet Take 1 tablet (25 mg total) by mouth daily.  . Melatonin 10 MG TABS Take 1 tablet by mouth.  . Omega-3 Fatty Acids (FISH OIL) 1000 MG CAPS Take 1,000 mg by mouth daily.  . rosuvastatin (CRESTOR) 5 MG tablet TAKE 1 TABLET BY MOUTH  EVERY OTHER DAY  . triamcinolone cream (KENALOG) 0.1 % Apply 1 application topically 2 (two) times daily as needed.     Allergies:   Niacin and related   Social History   Tobacco Use  . Smoking status: Former Smoker    Types: Cigarettes, Cigars    Last attempt to quit: 1990    Years since quitting: 30.2  . Smokeless tobacco: Never Used  . Tobacco comment: Smoked afternoon cigar  Substance Use Topics  . Alcohol use: No  . Drug use: No     Family Hx: The patient's family history includes Anxiety disorder in his daughter; Arthritis in his daughter; Congestive Heart Failure in his mother; Depression in his daughter; Diabetes in his son; Heart disease in his father and mother; Hyperlipidemia in his daughter, father, and son; Kidney disease in his father.  ROS:   Please see the history of present illness.   All other systems reviewed and are negative.   Labs/Other Tests and Data Reviewed:    Recent Labs: 05/31/2017: ALT 33; TSH 2.20 08/30/2017: Hemoglobin 15.4; Platelets 149 11/14/2017: BUN 13; Creatinine, Ser 1.22; Potassium 3.8;  Sodium 140   Recent Lipid Panel Lab Results  Component Value Date/Time   CHOL 144 11/30/2017 09:53 AM   TRIG 238 (H) 11/30/2017 09:53 AM   HDL 29 (L) 11/30/2017 09:53 AM   CHOLHDL 5.0 (H) 11/30/2017 09:53 AM   LDLCALC 82 11/30/2017 09:53 AM    Wt Readings from Last 3 Encounters:  05/29/18 223 lb (101.2 kg)  03/30/18 226 lb (102.5 kg)  03/02/18 225 lb 9.6 oz (102.3 kg)     Objective:    Vital Signs:  BP 139/85 (BP Location: Right Arm) Comment: Pt having BP cuff issues.  Pulse 60   Ht 5\' 8"  (1.727 m)   Wt 223 lb (101.2 kg)   BMI 33.91 kg/m    ASSESSMENT & PLAN:    Chronic HFpEF: Symptoms stable since 11/2017 and improved compared with last summer, at which time ACEI was  switched to ARB.  Weight is stable.  We will continue current medications.  Sodium restriction was encouraged, as well as regular walking.  Atypical chest pain: Most likely musculoskeletal in the setting of muscle soreness from attempting to dig out a tree stump.  Stress test in 2018 was low risk.  No further work-up planned unless symptoms recur.  Continue ASA and statin for primary prevention.  Hypertension: BP is mildly elevated in the setting of DM (goal < 130/80).  Given dietary indiscretion with sodium intake, I have asked [name] to minimize his salt intake and to continue monitoring his BP at home.  Continue current doses of carvedilol, amlodipine, HCTZ, and losartan.  Hyperlipidemia: LDL in 11/2017 was reasonable at 82.  Continue rosuvastatin 5 mg daily.  Lifestyle modifications were reinforced.  COVID-19 Education: The signs and symptoms of COVID-19 were discussed with the patient and how to seek care for testing (follow up with PCP or arrange E-visit).  The importance of social distancing was discussed today.  Patient Risk:   After full review of this patient's clinical status, I feel that they are at least moderate risk at this time.  Time:   Today, I have spent 15 minutes with the patient  with telehealth technology discussing HFpEF, chest pain, sodium restriction, and COVID-19 precautions.     Medication Adjustments/Labs and Tests Ordered: Current medicines are reviewed at length with the patient today.  Concerns regarding medicines are outlined above.   Tests Ordered: None.  Medication Changes: None.  Disposition:  Follow up in 6 month(s)  Signed, Drew Kendallhristopher Tillie Viverette, MD  05/29/2018 9:10 AM    [facility]

## 2018-05-29 ENCOUNTER — Telehealth (INDEPENDENT_AMBULATORY_CARE_PROVIDER_SITE_OTHER): Payer: Medicare Other | Admitting: Internal Medicine

## 2018-05-29 ENCOUNTER — Other Ambulatory Visit: Payer: Self-pay

## 2018-05-29 ENCOUNTER — Encounter: Payer: Self-pay | Admitting: Internal Medicine

## 2018-05-29 VITALS — BP 139/85 | HR 60 | Ht 68.0 in | Wt 223.0 lb

## 2018-05-29 DIAGNOSIS — R0789 Other chest pain: Secondary | ICD-10-CM

## 2018-05-29 DIAGNOSIS — I10 Essential (primary) hypertension: Secondary | ICD-10-CM

## 2018-05-29 DIAGNOSIS — I5032 Chronic diastolic (congestive) heart failure: Secondary | ICD-10-CM

## 2018-05-29 DIAGNOSIS — E782 Mixed hyperlipidemia: Secondary | ICD-10-CM

## 2018-05-29 DIAGNOSIS — R079 Chest pain, unspecified: Secondary | ICD-10-CM | POA: Insufficient documentation

## 2018-05-29 NOTE — Patient Instructions (Signed)
Medication Instructions:  Your physician recommends that you continue on your current medications as directed. Please refer to the Current Medication list given to you today.  If you need a refill on your cardiac medications before your next appointment, please call your pharmacy.   Lab work: none If you have labs (blood work) drawn today and your tests are completely normal, you will receive your results only by: Marland Kitchen MyChart Message (if you have MyChart) OR . A paper copy in the mail If you have any lab test that is abnormal or we need to change your treatment, we will call you to review the results.  Testing/Procedures: none  Follow-Up: At [facility], you and your health needs are our priority.  As part of our continuing mission to provide you with exceptional heart care, we have created designated Provider Care Teams.  These Care Teams include your primary Cardiologist (physician) and Advanced Practice Providers (APPs -  Physician Assistants and Nurse Practitioners) who all work together to provide you with the care you need, when you need it. You will need a follow up appointment in 6 months.  Please call our office 2 months in advance to schedule this appointment.  You may see Yvonne Kendall, MD or one of the following Advanced Practice Providers on your designated Care Team:   Nicolasa Ducking, NP Eula Listen, PA-C . Marisue Ivan, PA-C  A

## 2018-06-01 ENCOUNTER — Ambulatory Visit (INDEPENDENT_AMBULATORY_CARE_PROVIDER_SITE_OTHER): Payer: Medicare Other | Admitting: Family Medicine

## 2018-06-01 ENCOUNTER — Other Ambulatory Visit: Payer: Self-pay

## 2018-06-01 ENCOUNTER — Encounter: Payer: Self-pay | Admitting: Family Medicine

## 2018-06-01 ENCOUNTER — Telehealth: Payer: Self-pay | Admitting: Family Medicine

## 2018-06-01 VITALS — BP 122/70 | HR 63

## 2018-06-01 DIAGNOSIS — E782 Mixed hyperlipidemia: Secondary | ICD-10-CM

## 2018-06-01 DIAGNOSIS — I129 Hypertensive chronic kidney disease with stage 1 through stage 4 chronic kidney disease, or unspecified chronic kidney disease: Secondary | ICD-10-CM | POA: Diagnosis not present

## 2018-06-01 DIAGNOSIS — N183 Chronic kidney disease, stage 3 unspecified: Secondary | ICD-10-CM

## 2018-06-01 DIAGNOSIS — I10 Essential (primary) hypertension: Secondary | ICD-10-CM

## 2018-06-01 DIAGNOSIS — E034 Atrophy of thyroid (acquired): Secondary | ICD-10-CM | POA: Diagnosis not present

## 2018-06-01 DIAGNOSIS — E1169 Type 2 diabetes mellitus with other specified complication: Secondary | ICD-10-CM | POA: Diagnosis not present

## 2018-06-01 DIAGNOSIS — E669 Obesity, unspecified: Secondary | ICD-10-CM

## 2018-06-01 MED ORDER — LIRAGLUTIDE 18 MG/3ML ~~LOC~~ SOPN: PEN_INJECTOR | SUBCUTANEOUS | Status: DC

## 2018-06-01 NOTE — Assessment & Plan Note (Signed)
Continue statin; keep trying to lose weight and eat better

## 2018-06-01 NOTE — Telephone Encounter (Signed)
Please schedule patient for a follow-up appointment on or after August 01, 2018 Dr. Sherie Don In office is fine and we'll see where COVID-19 pandemic stands at that time

## 2018-06-01 NOTE — Assessment & Plan Note (Signed)
Check TSH when able to come in the office soon after COVID-19 pandemic wanes

## 2018-06-01 NOTE — Progress Notes (Signed)
BP 122/70 (BP Location: Right Arm, Patient Position: Sitting, Cuff Size: Normal)   Pulse 63    Subjective:    Patient ID: Drew [name], male    DOB: 1940-08-13, 78 y.o.   MRN: [id number]  HPI: Drew [name] is a 78 y.o. male  Chief Complaint  Patient presents with  . Follow-up    HPI Virtual Visit via Telephone/Video Note   I connect with the patient via: telephone I verified that I am speaking with the correct person using two identifiers.  Call started: 10:14 am Call terminated: 10:39 am  Total length of call: 26 minutes 21 seconds   I discussed the limitations, risks, security, and privacy concerns of performing an evaluation and management service by telephone and the availability of in-person appointments. Staff discussed with the patient that he/she may be responsible for charges related to this service. The patient expressed understanding and agreed to proceed.  Patient location: home, [place] Provider location: home office Additional participants: wife, Kriste Basque  He has type 2 diabetes He had been relying on samples of medicine but with the COVID-19 pandemic, drug reps are not coming in to the office so the sample supply has diminished It turns out that he just got a big box of Victoza in the mail from [employer] and a pack of needles, those just came automatically from the pharmacy it turns out; wife called pharmacy to get some other medicines and they came together Took last dose on Wed He thinks his diabetes is doing "good"; FSBS 118 this morning; usually 120-135 Lab Results  Component Value Date   HGBA1C 5.9 (H) 11/30/2017   High cholesterol; on statin and fish oil Lab Results  Component Value Date   CHOL 144 11/30/2017   HDL 29 (L) 11/30/2017   LDLCALC 82 11/30/2017   TRIG 238 (H) 11/30/2017   CHOLHDL 5.0 (H) 11/30/2017   Chronic kidney disease; seeing nephrologist  HTN; controlled BP with medicine  Hypothyroidism; currently on low dose thyroid medicine   Lab Results  Component Value Date   TSH 2.20 05/31/2017   COVID-19 discussed; he is social distancing, has not been out since March 23rd No sx of COVID-19; explained he is at high risk of complications with his diabetes so good hand washing, social distancing encouraged; did not get sick after possible exposures Warned about church services and COVID-19 exposures, advised worship on internet instead of in person  Depression screen [facility] 2/9 06/01/2018 03/02/2018 01/27/2018 11/30/2017 11/30/2017  Decreased Interest 0 0 0 0 0  Down, Depressed, Hopeless 0 0 0 0 0  PHQ - 2 Score 0 0 0 0 0  Altered sleeping 0 0 2 0 -  Tired, decreased energy 0 0 1 0 -  Change in appetite 0 0 0 0 -  Feeling bad or failure about yourself  0 0 0 0 -  Trouble concentrating 0 0 0 0 -  Moving slowly or fidgety/restless 0 0 0 0 -  Suicidal thoughts 0 0 0 0 -  PHQ-9 Score 0 0 3 0 -  Difficult doing work/chores Not difficult at all - - Not difficult at all -   Fall Risk  06/01/2018 03/02/2018 01/27/2018 11/30/2017 11/30/2017  Falls in the past year? 0 0 1 Yes No  Number falls in past yr: - 0 1 1 -  Comment - - tripped in yard - -  Injury with Fall? - 0 0 No -  Follow up - - Falls evaluation completed;Falls  prevention discussed - -    Relevant past medical, surgical, family and social history reviewed Past Medical History:  Diagnosis Date  . (HFpEF) heart failure with preserved ejection fraction (HCC)    a. 02/2016 Echo: EF nl. Gr1 DD. Mild AI. Mod thickened AoV w/o stenosis. Nl RV size and fxn.  . Chronic kidney disease, stage III (moderate) (HCC) 08/15/2015  . Diabetes mellitus with complication (HCC)   . GERD (gastroesophageal reflux disease)   . H/O: upper GI bleed 2015  . History of stress test    a. 05/2016 Lexiscan MV: EF 72%, No ischemia-->Low risk.  Marland Kitchen. Hypercholesteremia   . Hypertension   . Obesity (BMI 30-39.9) 08/07/2015  . Peripheral vascular disease (HCC) 12/26/2015   Past Surgical History:  Procedure  Laterality Date  . CATARACT EXTRACTION, BILATERAL Bilateral 11/18/2017  . COLONOSCOPY N/A 12/27/2013   Procedure: COLONOSCOPY;  Surgeon: Barrie FolkJohn C Hayes, MD;  [facility];  Service: Endoscopy;  Laterality: N/A;  . ENTEROSCOPY N/A 12/21/2013   Procedure: ENTEROSCOPY;  Surgeon: Vertell NovakJames L Edwards Jr., MD;  [facility];  Service: Endoscopy;  Laterality: N/A;  . ENTEROSCOPY N/A 12/27/2013   Procedure: ENTEROSCOPY;  Surgeon: Barrie FolkJohn C Hayes, MD;  [facility];  Service: Endoscopy;  Laterality: N/A;   Family History  Problem Relation Age of Onset  . Hyperlipidemia Father   . Kidney disease Father   . Heart disease Father   . Congestive Heart Failure Mother   . Heart disease Mother   . Hyperlipidemia Daughter   . Hyperlipidemia Son   . Diabetes Son   . Anxiety disorder Daughter   . Depression Daughter   . Arthritis Daughter    Social History   Tobacco Use  . Smoking status: Former Smoker    Types: Cigarettes, Cigars    Last attempt to quit: 1990    Years since quitting: 30.2  . Smokeless tobacco: Never Used  . Tobacco comment: Smoked afternoon cigar  Substance Use Topics  . Alcohol use: No  . Drug use: No     [facility]  AUDIT-C Score  0      Interim medical history since last visit reviewed. Allergies and medications reviewed  Review of Systems Per HPI unless specifically indicated above     Objective:    BP 122/70 (BP Location: Right Arm, Patient Position: Sitting, Cuff Size: Normal)   Pulse 63   Wt Readings from Last 3 Encounters:  05/29/18 223 lb (101.2 kg)  03/30/18 226 lb (102.5 kg)  03/02/18 225 lb 9.6 oz (102.3 kg)    Physical Exam Pulmonary:     Effort: No respiratory distress.  Neurological:     Mental Status: He is alert.  Psychiatric:        Speech: Speech is not rapid and pressured, delayed or slurred.    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)    Check TSH when able to come in the office soon after COVID-19 pandemic wanes  Diabetes mellitus type 2 in obese [facility]) - Primary    He was sent Victoza from his pharmacy; he now has a 3 month supply; will not need Trulicity at this time; do not take both together; he is out of Trulicty; took last dose Wednesday; this coming Wednesday, April 15th, start 0.6 mg daily for 1 weeks, then 1.2 mg daily for 1 week, then 1.8 mg daily (he wrote this all down); as that Victoza supply runs down, we may be getting samples again, but let us know how we can help      Relevant Medications   liraglutide (VICTOZA) 18 MG/3ML SOPN     Genitourinary   Chronic kidney disease, stage III (moderate) (HCC) (Chronic)    Managed by nephrologist; avoid NSAIDs        Other   Mixed hyperlipidemia    Continue statin; keep trying to lose weight and eat better          Follow up plan: Return in about 2 months (around 08/01/2018) for follow-up visit with Dr. Sherie Don (will get labs then).  An after-visit summary was printed and given to the patient at check-out.  Please see the patient instructions which may contain other information and recommendations beyond what is mentioned above in the assessment and plan.  Meds ordered this encounter  Medications  . liraglutide (VICTOZA) 18 MG/3ML SOPN    Sig: 0.6 mg subcu daily x 1 week, then 1.2 mg daily x 1 week, then 1.8 mg daily    No orders of the defined types were placed in this encounter.

## 2018-06-01 NOTE — Assessment & Plan Note (Signed)
He was sent Victoza from his pharmacy; he now has a 3 month supply; will not need Trulicity at this time; do not take both together; he is out of Trulicty; took last dose Wednesday; this coming Wednesday, April 15th, start 0.6 mg daily for 1 weeks, then 1.2 mg daily for 1 week, then 1.8 mg daily (he wrote this all down); as that Victoza supply runs down, we may be getting samples again, but let us know how we can help

## 2018-06-01 NOTE — Assessment & Plan Note (Signed)
Well controlled.continue regimen.  

## 2018-06-01 NOTE — Assessment & Plan Note (Signed)
Managed by nephrologist; avoid NSAIDs 

## 2018-06-02 NOTE — Telephone Encounter (Signed)
done

## 2018-06-05 DIAGNOSIS — I10 Essential (primary) hypertension: Secondary | ICD-10-CM | POA: Diagnosis not present

## 2018-06-05 DIAGNOSIS — G4733 Obstructive sleep apnea (adult) (pediatric): Secondary | ICD-10-CM | POA: Diagnosis not present

## 2018-07-10 DIAGNOSIS — I10 Essential (primary) hypertension: Secondary | ICD-10-CM | POA: Diagnosis not present

## 2018-07-10 DIAGNOSIS — G4733 Obstructive sleep apnea (adult) (pediatric): Secondary | ICD-10-CM | POA: Diagnosis not present

## 2018-07-27 ENCOUNTER — Other Ambulatory Visit: Payer: Self-pay | Admitting: Family Medicine

## 2018-07-27 DIAGNOSIS — E034 Atrophy of thyroid (acquired): Secondary | ICD-10-CM

## 2018-08-02 ENCOUNTER — Ambulatory Visit: Payer: Medicare Other | Admitting: Family Medicine

## 2018-08-02 ENCOUNTER — Ambulatory Visit: Payer: Medicare Other | Admitting: Nurse Practitioner

## 2018-08-02 ENCOUNTER — Encounter: Payer: Self-pay | Admitting: Family Medicine

## 2018-08-02 ENCOUNTER — Ambulatory Visit (INDEPENDENT_AMBULATORY_CARE_PROVIDER_SITE_OTHER): Payer: Medicare Other | Admitting: Family Medicine

## 2018-08-02 ENCOUNTER — Ambulatory Visit: Payer: Self-pay | Admitting: Family Medicine

## 2018-08-02 VITALS — BP 130/72 | HR 71 | Temp 98.0°F | Resp 16 | Ht 68.0 in | Wt 224.5 lb

## 2018-08-02 DIAGNOSIS — M1A0610 Idiopathic chronic gout, right knee, without tophus (tophi): Secondary | ICD-10-CM

## 2018-08-02 DIAGNOSIS — E1169 Type 2 diabetes mellitus with other specified complication: Secondary | ICD-10-CM

## 2018-08-02 DIAGNOSIS — I10 Essential (primary) hypertension: Secondary | ICD-10-CM | POA: Diagnosis not present

## 2018-08-02 DIAGNOSIS — E782 Mixed hyperlipidemia: Secondary | ICD-10-CM | POA: Diagnosis not present

## 2018-08-02 DIAGNOSIS — E034 Atrophy of thyroid (acquired): Secondary | ICD-10-CM

## 2018-08-02 DIAGNOSIS — E669 Obesity, unspecified: Secondary | ICD-10-CM

## 2018-08-02 DIAGNOSIS — N183 Chronic kidney disease, stage 3 unspecified: Secondary | ICD-10-CM

## 2018-08-02 DIAGNOSIS — I5032 Chronic diastolic (congestive) heart failure: Secondary | ICD-10-CM

## 2018-08-02 MED ORDER — LIRAGLUTIDE 18 MG/3ML ~~LOC~~ SOPN: PEN_INJECTOR | SUBCUTANEOUS | 1 refills | Status: DC

## 2018-08-02 NOTE — Progress Notes (Signed)
Name: Drew DownsJohn C [name]   MRN: [id number]    DOB: 11/13/1940   Date:08/02/2018       Progress Note  Subjective  Chief Complaint  Chief Complaint  Patient presents with  . Diabetes  . Hyperlipidemia  . Hypertension    I connected with  Drew DownsJohn C [name]  on 08/02/18 at 10:40 AM EDT by a video enabled telemedicine application and verified that I am speaking with the correct person using two identifiers.  I discussed the limitations of evaluation and management by telemedicine and the availability of in person appointments. The patient expressed understanding and agreed to proceed. Staff also discussed with the patient that there may be a patient responsible charge related to this service. Patient Location: Office Provider Location: Home Additional Individuals present: None  HPI  Hypothyroidism: History: hypothyroidism Current Medication Regimen: 25mcg synthroid Current Symptoms: denies fatigue, weight changes, cold intolerance, bowel/skin changes or CVS symptoms.  Does have occasional heat intolerance.  Most recent results are below; we will be repeating labs today. Lab Results  Component Value Date   TSH 2.20 05/31/2017   Diabetes mellitus type 2 Checking sugars?  yes How often? Daily Range (low to high) over last two weeks:  100-125; he notes has been skipping victoza on days when the blood sugar is 100 because he feels a bit weak when sugar is lower and he uses the Vitcoza.  We will adjust his dosing.  Checking feet every day/night?  yes Last eye exam:  UTD.  Denies: Polyuria, polydipsia, polyphagia, vision changes.  Does have occasional neuropathic pain in the bilateral feet - epsom salt soak did seem to help.  Most recent A1C:  Lab Results  Component Value Date   HGBA1C 5.9 (H) 11/30/2017    We will recheck today. Last CMP Results : is due for repeat todayUrine Micro UTD? No Current Medication Management: Diabetic Medications:  ACEI/ARB: Yes Statin: Yes Aspirin therapy:  Yes  CKD stage II: Due for lab check today; taking Losartan daily.  HLD: Denies chest pain, shortness of breath, or myalgias.  Taking crestor 5mg  and doing well on current dose.   HTN:  -does take medications as prescribed - current regimen includes amlodipine, losartan, HCTZ, Coreg.  - taking medications as instructed, no medication side effects noted, no TIAs, no chest pain on exertion, no dyspnea on exertion, no swelling of ankles. Does have occasional headaches. - DASH diet discussed  CHF: Has CHF and is seeing Dr. Okey DupreEnd - last visit was April 2020.  Taking Coreg. Denies any BLE currently, no chest pain.  Does get some shortness of breath with exertion sometimes, but recovers with rest. Stable since 2019.  Encouraged DASH diet.   Gout: Taking allupurinol and doing well, no recent outbreaks.   Patient Active Problem List   Diagnosis Date Noted  . Atypical chest pain 05/29/2018  . Diabetes mellitus type 2 in obese (HCC) 11/30/2017  . Chronic heart failure with preserved ejection fraction (HCC) 10/20/2017  . Diastolic dysfunction 02/10/2017  . Obstructive sleep apnea 02/10/2017  . Dyspnea on exertion 10/20/2016  . Chronic fatigue 10/20/2016  . Pain in both lower extremities 10/20/2016  . Arm pain, medial, right 08/27/2016  . Encounter for cardiac risk counseling 02/10/2016  . Light-headedness 11/26/2015  . Abnormal ankle brachial index (ABI) 11/26/2015  . Chronic kidney disease, stage III (moderate) (HCC) 08/15/2015  . H/O: upper GI bleed   . Gout 08/07/2015  . Medication monitoring encounter 08/07/2015  . Obesity (BMI  30-39.9) 08/07/2015  . Hypothyroidism due to acquired atrophy of thyroid 03/11/2015  . History of prostatitis 11/06/2014  . Essential hypertension 11/06/2014  . Mixed hyperlipidemia 11/06/2014  . 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 Flat feet, bilateral 11/06/2014  . History of anemia 11/06/2014  . Chronic multiple gastric erosions  11/06/2014  . Scrotal swelling 11/06/2014  . 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 Chronic gout of ankle 08/16/2014  . H/O: GI bleed 12/20/2013    Past Surgical History:  Procedure Laterality Date  . CATARACT EXTRACTION, BILATERAL Bilateral 11/18/2017  . COLONOSCOPY N/A 12/27/2013   Procedure: COLONOSCOPY;  Surgeon: Barrie FolkJohn C Hayes, MD;  [facility];  Service: Endoscopy;  Laterality: N/A;  . ENTEROSCOPY N/A 12/21/2013   Procedure: ENTEROSCOPY;  Surgeon: Vertell NovakJames L Edwards Jr., MD;  [facility];  Service: Endoscopy;  Laterality: N/A;  . ENTEROSCOPY N/A 12/27/2013   Procedure: ENTEROSCOPY;  Surgeon: Barrie FolkJohn C Hayes, MD;  [facility];  Service: Endoscopy;  Laterality: N/A;    Family History  Problem Relation Age of Onset  . Hyperlipidemia Father   . Kidney disease Father   . Heart disease Father   . Congestive Heart Failure Mother   . Heart disease Mother   . Hyperlipidemia Daughter   . Hyperlipidemia Son   . Diabetes Son   . Anxiety disorder Daughter   . Depression Daughter   . Arthritis Daughter     Social History   Socioeconomic History  . Marital status: Married    Spouse name: Not on file  . Number of children: 4  . Years of education: Not on file  . Highest education level: High school graduate  Occupational History  . Occupation: retired  Engineer, productionocial Needs  . Financial resource strain: Somewhat hard  . Food insecurity:    Worry: Sometimes true    Inability: Sometimes true  . Transportation needs:    Medical: No    Non-medical: No  Tobacco Use  . Smoking status: Former Smoker    Types: Cigarettes, Cigars    Last attempt to quit: 1990    Years since quitting: 30.4  . Smokeless tobacco: Never Used  . Tobacco comment: Smoked afternoon cigar  Substance and Sexual Activity  . Alcohol use: No  . Drug use: No  . Sexual activity: Yes  Lifestyle  . Physical activity:    Days per week: 0 days     Minutes per session: 0 min  . Stress: Not at all  Relationships  . Social connections:    Talks on phone: More than three times a week    Gets together: Once a week    Attends religious service: More than 4 times per year    Active member of club or organization: No    Attends meetings of clubs or organizations: Never    Relationship status: Married  . Intimate partner violence:    Fear of current or ex partner: No    Emotionally abused: No    Physically abused: No    Forced sexual activity: No  Other Topics Concern  . Not on file  Social History Narrative  . Not on file     Current Outpatient Medications:  .  allopurinol (ZYLOPRIM) 100 MG tablet, Take 100 mg by mouth 2 (two) times daily., Disp: , Rfl:  .  amLODipine (NORVASC) 5 MG tablet, TAKE 1 TABLET BY MOUTH  DAILY, Disp: 90 tablet, Rfl:  2 .  aspirin EC 81 MG tablet, Take 81 mg by mouth daily., Disp: , Rfl:  .  carvedilol (COREG) 3.125 MG tablet, TAKE 1 TABLET BY MOUTH TWO  TIMES DAILY, Disp: 180 tablet, Rfl: 2 .  Coenzyme Q10 (CO Q 10) 100 MG CAPS, Take 100 mg by mouth daily., Disp: , Rfl:  .  famotidine (PEPCID) 20 MG tablet, Take 20 mg by mouth daily. , Disp: , Rfl:  .  hydrochlorothiazide (MICROZIDE) 12.5 MG capsule, Take 12.5 mg by mouth 3 (three) times a week., Disp: , Rfl:  .  levothyroxine (SYNTHROID) 25 MCG tablet, TAKE 1 TABLET BY MOUTH  EVERY MORNING, Disp: 90 tablet, Rfl: 0 .  liraglutide (VICTOZA) 18 MG/3ML SOPN, INJECT SUBCUTANEOUSLY 1.8MG  (0.3 ML) DAILY, Disp: 27 mL, Rfl: 1 .  Melatonin 10 MG TABS, Take 1 tablet by mouth as needed. , Disp: , Rfl:  .  Omega-3 Fatty Acids (FISH OIL) 1000 MG CAPS, Take 1,000 mg by mouth daily., Disp: , Rfl:  .  rosuvastatin (CRESTOR) 5 MG tablet, TAKE 1 TABLET BY MOUTH  EVERY OTHER DAY, Disp: 45 tablet, Rfl: 3 .  triamcinolone cream (KENALOG) 0.1 %, Apply 1 application topically 2 (two) times daily as needed., Disp: 30 g, Rfl: 0 .  losartan (COZAAR) 25 MG tablet, Take 1 tablet (25  mg total) by mouth daily., Disp: 90 tablet, Rfl: 3  Allergies  Allergen Reactions  . Niacin And Related Itching and Rash    I personally reviewed active problem list, medication list, allergies, notes from last encounter, lab results with the patient/caregiver today.   ROS Ten systems reviewed and is negative except as mentioned in HPI  Objective  Today's Vitals   08/02/18 0957  BP: 130/72  Pulse: 71  Resp: 16  Temp: 98 F (36.7 C)  TempSrc: Oral  SpO2: 95%  Weight: 224 lb 8 oz (101.8 kg)  Height: 5\' 8"  (1.727 m)   Body mass index is 34.14 kg/m.  Physical Exam Constitutional: Patient appears well-developed and well-nourished. No distress.  HENT: Head: Normocephalic and atraumatic.  Neck: Normal range of motion. Pulmonary/Chest: Effort normal. No respiratory distress. Speaking in complete sentences Neurological: Pt is alert and oriented to person, place, and time. Coordination, speech and gait are normal.  Psychiatric: Patient has a normal mood and affect. behavior is normal. Judgment and thought content normal.  No results found for this or any previous visit (from the past 72 hour(s)).  PHQ2/9: Depression screen [facility] 2/9 08/02/2018 06/01/2018 03/02/2018 01/27/2018 11/30/2017  Decreased Interest 0 0 0 0 0  Down, Depressed, Hopeless 0 0 0 0 0  PHQ - 2 Score 0 0 0 0 0  Altered sleeping 0 0 0 2 0  Tired, decreased energy 0 0 0 1 0  Change in appetite 0 0 0 0 0  Feeling bad or failure about yourself  0 0 0 0 0  Trouble concentrating 0 0 0 0 0  Moving slowly or fidgety/restless 0 0 0 0 0  Suicidal thoughts 0 0 0 0 0  PHQ-9 Score 0 0 0 3 0  Difficult doing work/chores - Not difficult at all - - Not difficult at all   PHQ-2/9 Result is negative.    Fall Risk: Fall Risk  08/02/2018 06/01/2018 03/02/2018 01/27/2018 11/30/2017  Falls in the past year? 0 0 0 1 Yes  Number falls in past yr: 0 - 0 1 1  Comment - - - tripped in yard -  Injury  with Fall? 0 - 0 0 No  Follow up - - -  Falls evaluation completed;Falls prevention discussed -    Assessment & Plan  1. Hypothyroidism due to acquired atrophy of thyroid - Continue current medication. - TSH  2. Diabetes mellitus type 2 in obese (HCC) - Reduce victoza to 1.2mg  dosing,  - COMPLETE METABOLIC PANEL WITH GFR - Hemoglobin A1c - liraglutide (VICTOZA) 18 MG/3ML SOPN; INJECT SUBCUTANEOUSLY 1.2MG  DAILY  Dispense: 27 mL; Refill: 1 - Urine Microalbumin w/creat. ratio  3. Chronic kidney disease, stage III (moderate) (HCC) - COMPLETE METABOLIC PANEL WITH GFR  4. Mixed hyperlipidemia - Continue statin therapy at this time, and continue with Dr. Okey DupreEnd - Lipid panel  5. Essential hypertension - Continue current medications and with Dr. Okey DupreEnd - COMPLETE METABOLIC PANEL WITH GFR  6. Chronic heart failure with preserved ejection fraction (HCC) - Continue beta blocker, stable symptoms, continue with Dr. Okey DupreEnd.  7. Idiopathic chronic gout of right knee without tophus - Continue allopurinol.  I discussed the assessment and treatment plan with the patient. The patient was provided an opportunity to ask questions and all were answered. The patient agreed with the plan and demonstrated an understanding of the instructions.  The patient was advised to call back or seek an in-person evaluation if the symptoms worsen or if the condition fails to improve as anticipated.  I provided 26 minutes of non-face-to-face time during this encounter.

## 2018-08-02 NOTE — Patient Instructions (Signed)
Reduce your Victoza to 1.2mg  daily.  If blood sugar is less than 100, hold the victoza that day.

## 2018-08-03 LAB — MICROALBUMIN / CREATININE URINE RATIO
Creatinine, Urine: 123 mg/dL (ref 20–320)
Microalb Creat Ratio: 17 mcg/mg creat (ref <30)
Microalb, Ur: 2.1 mg/dL

## 2018-08-03 LAB — COMPLETE METABOLIC PANEL WITH GFR
AG Ratio: 2.1 (calc) (ref 1.0–2.5)
ALT: 23 U/L (ref 9–46)
AST: 21 U/L (ref 10–35)
Albumin: 4.5 g/dL (ref 3.6–5.1)
Alkaline phosphatase (APISO): 52 U/L (ref 35–144)
BUN/Creatinine Ratio: 10 (calc) (ref 6–22)
BUN: 14 mg/dL (ref 7–25)
CO2: 23 mmol/L (ref 20–32)
Calcium: 9.4 mg/dL (ref 8.6–10.3)
Chloride: 107 mmol/L (ref 98–110)
Creat: 1.34 mg/dL — ABNORMAL HIGH (ref 0.70–1.18)
GFR, Est African American: 58 mL/min/{1.73_m2} — ABNORMAL LOW (ref >=60)
GFR, Est Non African American: 50 mL/min/{1.73_m2} — ABNORMAL LOW (ref >=60)
Globulin: 2.1 g/dL (calc) (ref 1.9–3.7)
Glucose, Bld: 104 mg/dL — ABNORMAL HIGH (ref 65–99)
Potassium: 4.1 mmol/L (ref 3.5–5.3)
Sodium: 140 mmol/L (ref 135–146)
Total Bilirubin: 1 mg/dL (ref 0.2–1.2)
Total Protein: 6.6 g/dL (ref 6.1–8.1)

## 2018-08-03 LAB — TSH: TSH: 2.89 mIU/L (ref 0.40–4.50)

## 2018-08-03 LAB — LIPID PANEL
Cholesterol: 131 mg/dL (ref <200)
HDL: 30 mg/dL — ABNORMAL LOW (ref >=40)
LDL Cholesterol (Calc): 74 mg/dL (calc)
Non-HDL Cholesterol (Calc): 101 mg/dL (calc) (ref <130)
Total CHOL/HDL Ratio: 4.4 (calc) (ref <5.0)
Triglycerides: 174 mg/dL — ABNORMAL HIGH (ref <150)

## 2018-08-03 LAB — HEMOGLOBIN A1C
Hgb A1c MFr Bld: 5.9 % of total Hgb — ABNORMAL HIGH (ref <5.7)
Mean Plasma Glucose: 123 (calc)
eAG (mmol/L): 6.8 (calc)

## 2018-08-29 ENCOUNTER — Other Ambulatory Visit: Payer: Self-pay

## 2018-08-29 NOTE — Patient Outreach (Signed)
[place] [facility]) Care Management  08/29/2018  KIANDRE SPAGNOLO Jun 20, 1940 [id number]  Medication Adherence call to Mr. Elridge Stemm [place] Identifiers Verify spoke with patient he is past due on Victoza and Rosuvastatin patient explain he is still taken both medications and has plenty patient will order when due.Mr. Coye is showing past due under [place].   [place] Management Direct Dial [phone]  Fax [phone] [email or web address]

## 2018-09-07 DIAGNOSIS — I10 Essential (primary) hypertension: Secondary | ICD-10-CM | POA: Diagnosis not present

## 2018-09-07 DIAGNOSIS — G4733 Obstructive sleep apnea (adult) (pediatric): Secondary | ICD-10-CM | POA: Diagnosis not present

## 2018-09-26 ENCOUNTER — Other Ambulatory Visit: Payer: Self-pay

## 2018-09-26 NOTE — Patient Outreach (Signed)
[place] [facility]) Care Management  09/26/2018  Drew [name] 1940/11/09 [id number]   Medication Adherence call to [place] spoke with patient he is past due on Victoza and Rosuvastatin 5 mg patient explain he is still taking both medication and has enough for another week patient ask if we can call [facility] and order both medications [facility] said patient is in the [facility] or donut hole and owns $300 call patient back he said he is on a fix income and can not afford to pay his medications at this time. I will send it to [facility] for Patients Assistance on Victoza. Drew [name] is showing past due under [place].   [place] Management Direct Dial [phone]  Fax [phone] [name]

## 2018-10-06 DIAGNOSIS — E1129 Type 2 diabetes mellitus with other diabetic kidney complication: Secondary | ICD-10-CM | POA: Diagnosis not present

## 2018-10-06 DIAGNOSIS — N261 Atrophy of kidney (terminal): Secondary | ICD-10-CM | POA: Diagnosis not present

## 2018-10-06 DIAGNOSIS — N281 Cyst of kidney, acquired: Secondary | ICD-10-CM | POA: Diagnosis not present

## 2018-10-06 DIAGNOSIS — N183 Chronic kidney disease, stage 3 (moderate): Secondary | ICD-10-CM | POA: Diagnosis not present

## 2018-10-11 ENCOUNTER — Other Ambulatory Visit: Payer: Self-pay | Admitting: Nephrology

## 2018-10-11 DIAGNOSIS — N281 Cyst of kidney, acquired: Secondary | ICD-10-CM

## 2018-10-13 ENCOUNTER — Telehealth: Payer: Self-pay | Admitting: Family Medicine

## 2018-10-13 NOTE — Telephone Encounter (Signed)
Patient wife is calling to request a refill on the patient's insulin pen needles. [id number] [facility]  Preferred [place] [address], [place] [phone] (Phone) [phone] (Fax)

## 2018-10-16 ENCOUNTER — Other Ambulatory Visit: Payer: Self-pay | Admitting: Emergency Medicine

## 2018-10-16 NOTE — Telephone Encounter (Signed)
Spoke to patients grand daughter and she stated that they are talking to Medication assistance.

## 2018-10-17 ENCOUNTER — Other Ambulatory Visit: Payer: Self-pay | Admitting: Pharmacy Technician

## 2018-10-17 ENCOUNTER — Other Ambulatory Visit: Payer: Self-pay | Admitting: Pharmacist

## 2018-10-17 NOTE — Patient Outreach (Signed)
[place]) Care Management  [place]   10/17/2018  Drew [name] [birth year] [id number]  Subjective:  Patient was referred for medication patient assistance evaluation by pharmacy technician Ana for liraglutide (Victoza).   Successful outreach to patient, HIPAA details verified, and patient verbally consented to call.   Patient states Victoza is too expensive for him to continue to afford as he reached the coverage gap. He confirms he has [employer]. He reports prescriber of medication is Isabel Caprice, NP.    Medication review completed with patient over phone.    Objective:   Current Medications: Current Outpatient Medications  Medication Sig Dispense Refill  . allopurinol (ZYLOPRIM) 100 MG tablet Take 100 mg by mouth 2 (two) times daily.    Marland Kitchen amLODipine (NORVASC) 5 MG tablet TAKE 1 TABLET BY MOUTH  DAILY 90 tablet 2  . aspirin EC 81 MG tablet Take 81 mg by mouth daily.    . carvedilol (COREG) 3.125 MG tablet TAKE 1 TABLET BY MOUTH TWO  TIMES DAILY 180 tablet 2  . Coenzyme Q10 (CO Q 10) 100 MG CAPS Take 100 mg by mouth daily.    . famotidine (PEPCID) 20 MG tablet Take 20 mg by mouth daily.     . hydrochlorothiazide (MICROZIDE) 12.5 MG capsule Take 12.5 mg by mouth 3 (three) times a week.    . levothyroxine (SYNTHROID) 25 MCG tablet TAKE 1 TABLET BY MOUTH  EVERY MORNING 90 tablet 0  . liraglutide (VICTOZA) 18 MG/3ML SOPN INJECT SUBCUTANEOUSLY 1.2MG  DAILY 27 mL 1  . Omega-3 Fatty Acids (FISH OIL) 1000 MG CAPS Take 1,000 mg by mouth daily.    . rosuvastatin (CRESTOR) 5 MG tablet TAKE 1 TABLET BY MOUTH  EVERY OTHER DAY 45 tablet 3  . triamcinolone cream (KENALOG) 0.1 % Apply 1 application topically 2 (two) times daily as needed. 30 g 0  . losartan (COZAAR) 25 MG tablet Take 1 tablet (25 mg total) by mouth daily. 90 tablet 3  . Melatonin 10 MG TABS Take 1 tablet by mouth as needed.      No current facility-administered  medications for this visit.     Functional Status: In your present state of health, do you have any difficulty performing the following activities: 08/02/2018 06/01/2018  Hearing? N Y  [place]? N N  Difficulty concentrating or making decisions? N N  Walking or climbing stairs? N N  Dressing or bathing? N N  Doing errands, shopping? N N  Preparing Food and eating ? - -  Using the Toilet? - -  In the past six months, have you accidently leaked urine? - -  Do you have problems with loss of bowel control? - -  Managing your Medications? - -  Managing your Finances? - -  Housekeeping or managing your Housekeeping? - -  Some recent data might be hidden   Assessment:  Medication patient assistance: -Patient reports exceeds household income for social security administration Extra Help.  -Patient reports meets income requirements for [name] Patient Assistance Program and would like to apply. Patient understands there is no guarantee he will be eligible.   Plan:  Address in chart verified as correct by patient---will have pharmacy technician mail out application for [name] patient assistance program.  Patient aware pharmacy technician, Caryl Pina or Sharee Pimple, will follow-up with patient.   Karrie Meres, PharmD, [place] [phone]

## 2018-10-17 NOTE — Patient Outreach (Signed)
[facility]) Care Management  10/17/2018  SULEMAN GUNNING May 27, 1940 [id number]                                        Medication Assistance Referral  Referral From: [place]  Medication/Company: Donna Bernard / [name] Patient application portion:  Education officer, museum portion: Faxed  to Raelyn Ensign, NP Provider address/fax verified via: Office website and phone call   Follow up:  Will follow up with patient in 5-10  business days to confirm application(s) have been received.  Emony Dormer P. Eligah Anello, [place] Management [phone]

## 2018-10-18 ENCOUNTER — Other Ambulatory Visit: Payer: Self-pay | Admitting: Family Medicine

## 2018-10-23 ENCOUNTER — Other Ambulatory Visit: Payer: Self-pay

## 2018-10-23 ENCOUNTER — Ambulatory Visit
Admission: RE | Admit: 2018-10-23 | Discharge: 2018-10-23 | Disposition: A | Discharge status: Home / Self Care | Payer: Medicare Other | Source: Ambulatory Visit | Attending: Nephrology | Admitting: Nephrology

## 2018-10-23 DIAGNOSIS — N281 Cyst of kidney, acquired: Secondary | ICD-10-CM | POA: Diagnosis not present

## 2018-10-23 LAB — POCT I-STAT CREATININE: Creatinine, Ser: 1.3 mg/dL — ABNORMAL HIGH (ref 0.61–1.24)

## 2018-10-23 MED ORDER — GADOBUTROL 1 MMOL/ML IV SOLN
10.0000 mL | Freq: Once | INTRAVENOUS | Status: AC | PRN
  Administered 2018-10-23: 10 mL via INTRAVENOUS

## 2018-10-26 ENCOUNTER — Other Ambulatory Visit: Payer: Self-pay | Admitting: Pharmacy Technician

## 2018-10-26 NOTE — Patient Outreach (Signed)
[place] [facility]) Care Management  10/26/2018  ATUL DELUCIA 10-03-1940 [id number]    Received all necessary documents and signatures from both patient and provider for [name] patient assistance for Victoza.  Submitted completed application to [name] via fax.  Will followup with [name] in 2-5 business days.  Fatih Stalvey P. [place], [place] Management [phone]

## 2018-10-31 ENCOUNTER — Other Ambulatory Visit: Payer: Self-pay | Admitting: Pharmacy Technician

## 2018-10-31 NOTE — Patient Outreach (Signed)
[place] [facility]) Care Management  10/31/2018  Drew [name] Feb 20, 1941 [id number]  ADDENDUM  Successful outreach call placed to patient in regards to [name] application for Victoza.  Spoke to patient and his wife, HIPAA identifiers verified.  Informed them that the company would not accept the proof of income that they submitted because it was not current. Informed them what [name] would deem acceptable such as current Science writer, tax return or bank statement. They both verbalized understanding.  Informed both patient and spouse that I would mail out a letter regarding this call with an enclosed envelope so they could mail the information back to me to submit to the company. They both verbalized agreement with this plan.  Will mail letter to patient when I am in the office on 11/02/2018 and will followup with them in 3-7 business days of that date.  Tifanny Dollens P. Dorissa Stinnette, [place] Management [phone]

## 2018-10-31 NOTE — Patient Outreach (Signed)
[place] [facility]) Care Management  10/31/2018  KASHAUN BEBO 11-22-1940 [id number]   Care coordination call placed to [place] in regards to patient's application for Victoza.  Spoke to [employer] who informed that the application is missing a valid proof of income. [place] informed they would accept up to date social security statements or 1099 tax form, tax return such as the 1040 or a bank statement showing automatic direct deposit of government funds such as Fish farm manager. The information must be within the last 3 months on bank letterhead.  Will outreach patient with this information.  Kalyani Maeda P. Athol Bolds, [place] Management [phone]

## 2018-11-02 ENCOUNTER — Encounter: Payer: Self-pay | Admitting: Pharmacy Technician

## 2018-11-10 ENCOUNTER — Other Ambulatory Visit: Payer: Self-pay | Admitting: Pharmacy Technician

## 2018-11-10 NOTE — Patient Outreach (Signed)
[place] [facility]) Care Management  11/10/2018  Drew [name] 02/10/1941 [id number]   Unsuccessful outreach call placed to patient in regards to patient's application for [place] with [name].  Unfortunately patient did not answer the phone, HIPAA compliant voicemail left.  Was calling patient to inquire if he received the letter and enclosed envelope to mail back his current financials so they could be submitted to the patient assistance company per their request.  Will outreach patient with 2nd attempt in 3-7 business days.  [name] P. Deslyn Cavenaugh, [place] Management [phone]

## 2018-11-15 ENCOUNTER — Other Ambulatory Visit: Payer: Self-pay

## 2018-11-15 NOTE — Patient Outreach (Signed)
[facility]) Care Management  11/15/2018  SALIOU BARNIER 13-May-1940 [id number]   Medication Adherence call to [place] spoke with patient he is past due on Losartan 25 mg patient explain he is taking 1 tablet daily and has received all his medications from [facility] a week ago,patient has a 90 days supply. Mr. Mckelvin is showing past due under [place].   [place] Management Direct Dial [phone]  Fax [phone] [email or web address]

## 2018-11-16 ENCOUNTER — Other Ambulatory Visit: Payer: Self-pay | Admitting: Pharmacy Technician

## 2018-11-16 NOTE — Patient Outreach (Signed)
[place] [facility]) Care Management  11/16/2018  JARRIN STALEY 01-20-1941 [id number]    Received patient's updated income and financial information for [name] patient assistance for Victoza.  Submitted income information via fax to [name].  Will followup with Novo Nordisk in 2-3 business days to inquire on status of application.  Aprille Sawhney P. Tanasha Menees, [place] Management [phone]

## 2018-11-20 ENCOUNTER — Other Ambulatory Visit: Payer: Self-pay | Admitting: Pharmacy Technician

## 2018-11-20 NOTE — Patient Outreach (Signed)
[place] [facility]) Care Management  11/20/2018  Drew [name] 01/08/1941 [id number]  ADDENDUM  Successful outgoing call placed to patient and Drew [name] in regards to [name] application for Victoza.  Spoke to patient and Drew [name], HIPAA identifiers verified.  Patient and Drew [name] were disappointed and frustrated when informed that [name] had rejected their second proof of income attempt. Informed patient and Drew [name] that the company requires the most up to date information on official letterhead. Informed patient and Drew [name] that if they submitted the entire bank statement then the non pertinent information could be blacked out by them and the only information presented to [name] would show the social security deposits. Patient and Drew [name] were agreeable to this plan.. Provided patient and Drew [name] the address to mail back the information. Patient and Drew [name] informed they should be getting the September statement in the next week or so and they will place it in the mail.  Will followup with patient in 10-15 business days if information has not been received back.  [name] P. Adetokunbo Mccadden, [place] Management [phone]

## 2018-11-20 NOTE — Patient Outreach (Signed)
[place] [facility]) Care Management  11/20/2018  Drew [name] May 14, 1940 [id number]    Care coordination call placed to [place] in regards to patient's Victoza application.  Spoke to [place] who informed the proof of income that was submitted is invalid. She informed the social security statements are out of date and need to be from the current year. They also need a copy of the entire letter which includes the patient and spouse's pre printed names on the documents as opposed to it being handwritten in. Also the other form of proof of income submitted she informed, appears to be a cut out of a bank statement. She informed they would again need the whole statement that shows the bank name preprinted on the statement as well as the name that the account is in whether it be the spouse or the patient himself.  Will outreach patient with this information.  [name] P. Catherin Doorn, [place] Management [phone]

## 2018-12-01 ENCOUNTER — Other Ambulatory Visit: Payer: Self-pay | Admitting: Family Medicine

## 2018-12-01 ENCOUNTER — Other Ambulatory Visit: Payer: Self-pay | Admitting: Internal Medicine

## 2018-12-01 DIAGNOSIS — E034 Atrophy of thyroid (acquired): Secondary | ICD-10-CM

## 2018-12-01 NOTE — Telephone Encounter (Signed)
Requested medication (s) are due for refill today: yes  Requested medication (s) are on the active medication list: yes  Last refill:  10/13/2018  Future visit scheduled: yes  Notes to clinic: requesting a 1 year supply   Requested Prescriptions  Pending Prescriptions Disp Refills   levothyroxine (SYNTHROID) 25 MCG tablet [Pharmacy Med Name: LEVOTHYROXINE  25MCG  TAB] 90 tablet 3    Sig: TAKE 1 TABLET BY MOUTH IN  THE MORNING     Endocrinology:  Hypothyroid Agents Failed - 12/01/2018  6:06 AM      Failed - TSH needs to be rechecked within 3 months after an abnormal result. Refill until TSH is due.      Passed - TSH in normal range and within 360 days    TSH  Date Value Ref Range Status  08/02/2018 2.89 0.40 - 4.50 mIU/L Final         Passed - Valid encounter within last 12 months    Recent Outpatient Visits          4 months ago Hypothyroidism due to acquired atrophy of thyroid   [place], [place], FNP   6 months ago Diabetes mellitus type 2 in obese [facility])   [place], Satira Anis, MD   9 months ago Dermatitis   Spive[place], Satira Anis, MD   1 year ago Diabetes mellitus type 2 in obese [facility])   [place], Satira Anis, MD   1 year ago Diabetes mellitus type 2, diet-controlled [facility])   [place]ard, Satira Anis, MD      Future Appointments            In 2 months Uvaldo Rising, Astrid Divine, [facility], [place]   In 2 months  [facility], [facility]

## 2018-12-08 ENCOUNTER — Other Ambulatory Visit: Payer: Self-pay | Admitting: Pharmacy Technician

## 2018-12-08 NOTE — Patient Outreach (Signed)
[place] [facility]) Care Management  12/08/2018  HEZAKIAH CHAMPEAU 1940-09-18 [id number]    Successful outreach call placed to patient in regards to [name] application for [place].  Spoke to patient's wife Wells Guiles, [place] identifiers verified.  Patient's wife informed she mailed back the updated proof of income on 12/05/2018.  Will followup with patient in 3-5 business days if information has not been received.  Jaquel Glassburn P. Azhia Siefken, [place] Management [phone]

## 2018-12-12 ENCOUNTER — Other Ambulatory Visit: Payer: Self-pay | Admitting: Pharmacy Technician

## 2018-12-12 NOTE — Patient Outreach (Signed)
[place] [facility]) Care Management  12/12/2018  Drew [name] January 14, 1941 [id number]    Received updated financial information for [name] patient assistance for Victoza.  Submitted completed application to [name] via fax.  Will followup with Novo Nordisk in 3-5 business ays to inquire if a determination has been made.  Deyja Sochacki P. Andersyn Fragoso, [place] Management [phone]

## 2018-12-15 ENCOUNTER — Other Ambulatory Visit: Payer: Self-pay | Admitting: Pharmacy Technician

## 2018-12-15 NOTE — Patient Outreach (Signed)
[place] [facility]) Care Management  12/15/2018  Drew [name] Jul 23, 1940 [id number]   Care coordination call placed to [place] in regards to [place] application.  Spoke to [place] who informed patient has been APPROVED 12/14/2018-02/22/2019. Mate[place] Flow informed patient would received 3 boxes of Victoza which would carry him to the end of the enrollment period. Mateo Flow informed this should be received at the provider's office in the next 10-14 business days. Mateo Flow also informed that patient would need to re enroll for 2021.  Will followup with patient in 10-20 business days to confirm medication was received.  Katreena Schupp P. Kayani Rapaport, [place] Management [phone]

## 2018-12-27 ENCOUNTER — Other Ambulatory Visit: Payer: Self-pay | Admitting: Internal Medicine

## 2018-12-27 NOTE — Telephone Encounter (Signed)
Please schedule 6 mo F/U with Dr. Saunders Revel. Thank you!

## 2018-12-29 ENCOUNTER — Ambulatory Visit (INDEPENDENT_AMBULATORY_CARE_PROVIDER_SITE_OTHER): Payer: Medicare Other | Admitting: Internal Medicine

## 2018-12-29 ENCOUNTER — Encounter: Payer: Self-pay | Admitting: Internal Medicine

## 2018-12-29 ENCOUNTER — Other Ambulatory Visit: Payer: Self-pay

## 2018-12-29 VITALS — BP 112/78 | HR 59 | Ht 68.0 in | Wt 227.0 lb

## 2018-12-29 DIAGNOSIS — E782 Mixed hyperlipidemia: Secondary | ICD-10-CM | POA: Diagnosis not present

## 2018-12-29 DIAGNOSIS — I208 Other forms of angina pectoris: Secondary | ICD-10-CM | POA: Diagnosis not present

## 2018-12-29 DIAGNOSIS — I2089 Other forms of angina pectoris: Secondary | ICD-10-CM

## 2018-12-29 DIAGNOSIS — I5032 Chronic diastolic (congestive) heart failure: Secondary | ICD-10-CM | POA: Diagnosis not present

## 2018-12-29 DIAGNOSIS — I10 Essential (primary) hypertension: Secondary | ICD-10-CM

## 2018-12-29 NOTE — Patient Instructions (Signed)
You will be given the flu shot today.  Medication Instructions:  Your physician recommends that you continue on your current medications as directed. Please refer to the Current Medication list given to you today.  *If you need a refill on your cardiac medications before your next appointment, please call your pharmacy*  Lab Work: none If you have labs (blood work) drawn today and your tests are completely normal, you will receive your results only by: Marland Kitchen MyChart Message (if you have MyChart) OR . A paper copy in the mail If you have any lab test that is abnormal or we need to change your treatment, we will call you to review the results.  Testing/Procedures: none  Follow-Up: At [facility], you and your health needs are our priority.  As part of our continuing mission to provide you with exceptional heart care, we have created designated Provider Care Teams.  These Care Teams include your primary Cardiologist (physician) and Advanced Practice Providers (APPs -  Physician Assistants and Nurse Practitioners) who all work together to provide you with the care you need, when you need it.  Your next appointment:   6 months  The format for your next appointment:   In Person  Provider:    You may see Yvonne Kendall, MD or one of the following Advanced Practice Providers on your designated Care Team:    Nicolasa Ducking, NP  Eula Listen, PA-C  Marisue Ivan, PA-C  Influenza (Flu) Vaccine (Inactivated or Recombinant): What You Need to Know 1. Why get vaccinated? Influenza vaccine can prevent influenza (flu). Flu is a contagious disease that spreads around the [place] every year, usually between October and May. Anyone can get the flu, but it is more dangerous for some people. Infants and young children, people 21 years of age and older, pregnant women, and people with certain health conditions or a weakened immune system are at greatest risk of flu complications. Pneumonia,  bronchitis, sinus infections and ear infections are examples of flu-related complications. If you have a medical condition, such as heart disease, cancer or diabetes, flu can make it worse. Flu can cause fever and chills, sore throat, muscle aches, fatigue, cough, headache, and runny or stuffy nose. Some people may have vomiting and diarrhea, though this is more common in children than adults. Each year thousands of people in the [place] die from flu, and many more are hospitalized. Flu vaccine prevents millions of illnesses and flu-related visits to the doctor each year. 2. Influenza vaccine CDC recommends everyone 48 months of age and older get vaccinated every flu season. Children 6 months through 3 years of age may need 2 doses during a single flu season. Everyone else needs only 1 dose each flu season. It takes about 2 weeks for protection to develop after vaccination. There are many flu viruses, and they are always changing. Each year a new flu vaccine is made to protect against three or four viruses that are likely to cause disease in the upcoming flu season. Even when the vaccine doesn't exactly match these viruses, it may still provide some protection. Influenza vaccine does not cause flu. Influenza vaccine may be given at the same time as other vaccines. 3. Talk with your health care provider Tell your vaccine provider if the person getting the vaccine:  Has had an allergic reaction after a previous dose of influenza vaccine, or has any severe, life-threatening allergies.  Has ever had Guillain-Barr Syndrome (also called GBS). In some cases, your  health care provider may decide to postpone influenza vaccination to a future visit. People with minor illnesses, such as a cold, may be vaccinated. People who are moderately or severely ill should usually wait until they recover before getting influenza vaccine. Your health care provider can give you more information. 4. Risks of a vaccine  reaction  Soreness, redness, and swelling where shot is given, fever, muscle aches, and headache can happen after influenza vaccine.  There may be a very small increased risk of Guillain-Barr Syndrome (GBS) after inactivated influenza vaccine (the flu shot). Young children who get the flu shot along with pneumococcal vaccine (PCV13), and/or DTaP vaccine at the same time might be slightly more likely to have a seizure caused by fever. Tell your health care provider if a child who is getting flu vaccine has ever had a seizure. People sometimes faint after medical procedures, including vaccination. Tell your provider if you feel dizzy or have vision changes or ringing in the ears. As with any medicine, there is a very remote chance of a vaccine causing a severe allergic reaction, other serious injury, or death. 5. What if there is a serious problem? An allergic reaction could occur after the vaccinated person leaves the clinic. If you see signs of a severe allergic reaction (hives, swelling of the face and throat, difficulty breathing, a fast heartbeat, dizziness, or weakness), call 9-1-1 and get the person to the nearest hospital. For other signs that concern you, call your health care provider. Adverse reactions should be reported to the Vaccine Adverse Event Reporting System (VAERS). Your health care provider will usually file this report, or you can do it yourself. Visit the VAERS website at www.vaers.SamedayNews.es or call (938)282-6223.VAERS is only for reporting reactions, and VAERS staff do not give medical advice. 6. The National Vaccine Injury Compensation Program The [employer] Vaccine Injury Compensation Program (VICP) is a federal program that was created to compensate people who may have been injured by certain vaccines. Visit the VICP website at GoldCloset.com.ee or call 857-331-4476 to learn about the program and about filing a claim. There is a time limit to file a claim for  compensation. 7. How can I learn more?  Ask your healthcare provider.  Call your local or state health department.  Contact the Centers for Disease Control and Prevention (CDC): ? Call 513-333-4862 (1-800-CDC-INFO) or ? Visit CDC's https://gibson.com/ Vaccine Information Statement (Interim) Inactivated Influenza Vaccine (10/06/2017) This information is not intended to replace advice given to you by your health care provider. Make sure you discuss any questions you have with your health care provider. Document Released: 12/03/2005 Document Revised: 05/30/2018 Document Reviewed: 10/10/2017 Elsevier Patient Education  2020 [employer].

## 2018-12-29 NOTE — Progress Notes (Signed)
Follow-up Outpatient Visit Date: 12/29/2018  Primary Care Provider: Arnetha Courser, MD [address] [place] [address]  Chief Complaint: Follow-up HFpEF  HPI:  Mr. Knoblock is a 78 y.o. year-old male with history of heart failure with preserved ejection fraction, hypertension, hyperlipidemia, chronic kidney disease stage III, GERD, and obesity, who presents for follow-up of chronic HFpEF.  We last spoke in April via virtual visit.  At that time, Mr. Mceachin reported improved energy and breathing.  He noted an episode of chest soreness after attempting to take out a tree stump.  This was felt to be musculoskeletal, and no further testing was pursued.  Today, Mr. Gallicchio reports that he feels very well.  He denies shortness of breath, palpitations, and lightheadedness.  He has experienced occasional edema in his feet.  He also reports a few episodes of mild chest heaviness when he overexerts himself in the yard.  This is unchanged from when we last spoke and infrequent.  He has not had any chest pain or other symptoms at rest or with mild activity.  He reports being compliant with most medications, though he recently had an issue with procuring Vick toes.  He has now been approved for medication assistance and will hopefully pick up his prescription soon.  --------------------------------------------------------------------------------------------------  Cardiovascular History & Procedures: Cardiovascular Problems:  Dyspnea on exertion  Leg pain  Risk Factors:  Hypertension, hyperlipidemia, diabetes mellitus, male gender, and age > 13  Cath/PCI:  None  CV Surgery:  None  EP Procedures and Devices:  None  Non-Invasive Evaluation(s):  Pharmacologic MPI (06/09/16): Low risk study without ischemia. Normal wall motion with LVEF of 72%.  TTE (03/17/16): Normal LV size percent. There is grade 1 diastolic dysfunction. Aortic valve is moderately thickened without  stenosis. There is mild aortic regurgitation. Normal RV size and function.  Lower extremity arterial vascular study (12/22/15): ABIs right 1.5, left 1.6. TBIsright 1.3, left 1.1.  Recent CV Pertinent Labs: Lab Results  Component Value Date   CHOL 131 08/02/2018   HDL 30 (L) 08/02/2018   LDLCALC 74 08/02/2018   TRIG 174 (H) 08/02/2018   CHOLHDL 4.4 08/02/2018   INR 1.32 12/22/2013   K 4.1 08/02/2018   MG 1.9 12/21/2013   BUN 14 08/02/2018   CREATININE 1.30 (H) 10/23/2018   CREATININE 1.34 (H) 08/02/2018    Past medical and surgical history were reviewed and updated in EPIC.  Current Meds  Medication Sig  . allopurinol (ZYLOPRIM) 100 MG tablet Take 100 mg by mouth 2 (two) times daily.  Marland Kitchen amLODipine (NORVASC) 5 MG tablet TAKE 1 TABLET BY MOUTH  DAILY  . aspirin EC 81 MG tablet Take 81 mg by mouth daily.  . carvedilol (COREG) 3.125 MG tablet TAKE 1 TABLET BY MOUTH TWO  TIMES DAILY  . Coenzyme Q10 (CO Q 10) 100 MG CAPS Take 100 mg by mouth daily.  . famotidine (PEPCID) 20 MG tablet TAKE 1 TABLET BY MOUTH  DAILY  . hydrochlorothiazide (MICROZIDE) 12.5 MG capsule Take 12.5 mg by mouth 3 (three) times a week.  . levothyroxine (SYNTHROID) 25 MCG tablet TAKE 1 TABLET BY MOUTH IN  THE MORNING  . losartan (COZAAR) 25 MG tablet TAKE 1 TABLET BY MOUTH  DAILY  . Omega-3 Fatty Acids (FISH OIL) 1000 MG CAPS Take 1,000 mg by mouth daily.  . rosuvastatin (CRESTOR) 5 MG tablet TAKE 1 TABLET BY MOUTH  EVERY OTHER DAY  . triamcinolone cream (KENALOG) 0.1 % Apply  1 application topically 2 (two) times daily as needed.    Allergies: Niacin and related  Social History   Tobacco Use  . Smoking status: Former Smoker    Types: Cigarettes, Cigars    Quit date: 1990    Years since quitting: 30.8  . Smokeless tobacco: Never Used  . Tobacco comment: Smoked afternoon cigar  Substance Use Topics  . Alcohol use: No  . Drug use: No    Family History  Problem Relation Age of Onset  .  Hyperlipidemia Father   . Kidney disease Father   . Heart disease Father   . Congestive Heart Failure Mother   . Heart disease Mother   . Hyperlipidemia Daughter   . Hyperlipidemia Son   . Diabetes Son   . Anxiety disorder Daughter   . Depression Daughter   . Arthritis Daughter     Review of Systems: A 12-system review of systems was performed and was negative except as noted in the HPI.  --------------------------------------------------------------------------------------------------  Physical Exam: BP 112/78 (BP Location: Left Arm, Patient Position: Sitting, Cuff Size: Normal)   Pulse (!) 59   Ht 5\' 8"  (1.727 m)   Wt 227 lb (103 kg)   SpO2 98%   BMI 34.52 kg/m   General: NAD. HEENT: No conjunctival pallor or scleral icterus.  Facemask in place. Neck: Supple without lymphadenopathy, thyromegaly, JVD, or HJR. Lungs: Normal work of breathing. Clear to auscultation bilaterally without wheezes or crackles. Heart: Regular rate and rhythm without murmurs, rubs, or gallops. Non-displaced PMI. Abd: Bowel sounds present. Soft, NT/ND without hepatosplenomegaly Ext: Trace ankle edema bilaterally. Skin: Warm and dry without rash.  EKG: Sinus bradycardia (heart rate 59 bpm) with left axis deviation and nonspecific T wave changes.  No significant change from prior tracing on/10/19.  Lab Results  Component Value Date   WBC 6.3 08/30/2017   HGB 15.4 08/30/2017   HCT 43.9 08/30/2017   MCV 92.6 08/30/2017   PLT 149 08/30/2017    Lab Results  Component Value Date   NA 140 08/02/2018   K 4.1 08/02/2018   CL 107 08/02/2018   CO2 23 08/02/2018   BUN 14 08/02/2018   CREATININE 1.30 (H) 10/23/2018   GLUCOSE 104 (H) 08/02/2018   ALT 23 08/02/2018    Lab Results  Component Value Date   CHOL 131 08/02/2018   HDL 30 (L) 08/02/2018   LDLCALC 74 08/02/2018   TRIG 174 (H) 08/02/2018   CHOLHDL 4.4 08/02/2018     --------------------------------------------------------------------------------------------------  ASSESSMENT AND PLAN: Chronic HFpEF: Mr. Fleek appears grossly euvolemic other than trace ankle edema.  He does not have any symptoms suggest worsening heart failure.  We will continue his current regimen of carvedilol 3.125 mg twice daily.  Amlodipine 5 mg daily, hydrochlorothiazide 12.5 mg 3 days a week, and losartan 25 mg daily.  I reinforced the importance of sodium restriction.  Stable angina: Mr. Arriaga reports mild chest heaviness with very strenuous activity, which has been present for at least 6 to 12 months.  Myocardial perfusion stress test in 2018 was low risk without ischemia or scar.  We will continue with current medications.  I advised Mr. Cooks to let Ulyses Amor know if his symptoms worsen so that we can consider repeat ischemia evaluation.  Hypertension: Blood pressure well controlled today.  No medication changes at this time.  Hyperlipidemia: LDL reasonably well controlled, though ideally we would like to target less than 70.  We will continue rosuvastatin 5 mg every  other day.  I encouraged lifestyle modifications, including weight loss through diet and exercise.  Influenza immunization: Seasonal flu vaccine administered at the patient's request.  Follow-up: Return to clinic in 6 months.  Yvonne Kendallhristopher Michael Ventresca, MD 12/29/2018 9:43 AM

## 2019-01-02 ENCOUNTER — Other Ambulatory Visit: Payer: Self-pay | Admitting: Pharmacist

## 2019-01-02 ENCOUNTER — Other Ambulatory Visit: Payer: Self-pay | Admitting: Pharmacy Technician

## 2019-01-02 NOTE — Patient Outreach (Addendum)
[facility]) Care Management  01/02/2019  Drew [name] [birth year] [id number]    Successful call placed to patient regarding patient assistance medication delivery of Victoza with [name], HIPAA identifiers verified. Patient confirmed receiving 2 or 3 boxes last week. Informed patient he would have to reapply next year. Informed patient we would try and reach out to him but that if he had not heard from [place] by the end of January to give [place] a call. Confirmed patient had name and number. Patient informed he had no other questions or concerns.   Follow up:  Will route note to [place] for case closure and will remove myself from care team.  Luiz Ochoa. Lorine Iannaccone, [place] [phone]

## 2019-01-02 NOTE — Patient Outreach (Signed)
[facility]) Care Management  01/02/2019  YSABEL COWGILL [birth year] [id number]  Received message from pharmacy technician Sharee Pimple member was approved for and received his liraglutide (Victoza) provided by [name] medication patient assistance program.   See note in chart from today, 01/02/2019.   Plan: Case closed at this time as medication patient assistance was approved.   Karrie Meres, PharmD, [place] [phone]

## 2019-01-05 ENCOUNTER — Other Ambulatory Visit: Payer: Self-pay

## 2019-01-05 NOTE — Patient Outreach (Signed)
[facility]) Care Management  01/05/2019  JAMION CARTER 05/23/1940 [id number]   Medication Adherence call to Mr. Kahron Kauth HIPPA Compliant Voice message left with a call back number. Mr. Parkerson is showing past due on Rosuvastatin 5 mg under [facility].   [place] Management Direct Dial [phone]  Fax [phone] [email or web address]

## 2019-01-22 ENCOUNTER — Telehealth: Payer: Self-pay

## 2019-01-22 DIAGNOSIS — S82201D Unspecified fracture of shaft of right tibia, subsequent encounter for closed fracture with routine healing: Secondary | ICD-10-CM

## 2019-01-22 DIAGNOSIS — S82401D Unspecified fracture of shaft of right fibula, subsequent encounter for closed fracture with routine healing: Secondary | ICD-10-CM

## 2019-01-22 NOTE — Telephone Encounter (Signed)
Documentation reviewed of UC notes - RIGHT fracture of distal tib/fib. Referral placed - urgent.

## 2019-01-22 NOTE — Telephone Encounter (Signed)
Sorry ankle fracture distal fibula and tibia, urgent care notes scanned in chart

## 2019-01-22 NOTE — Telephone Encounter (Signed)
Pt needs urgent referral for ortho in [place] for broken foot.  [address] [place] heard pop went to urgent care.

## 2019-01-23 ENCOUNTER — Encounter: Payer: Self-pay | Admitting: Family Medicine

## 2019-02-01 ENCOUNTER — Ambulatory Visit: Payer: Medicare Other

## 2019-02-01 ENCOUNTER — Ambulatory Visit (INDEPENDENT_AMBULATORY_CARE_PROVIDER_SITE_OTHER): Payer: Medicare Other | Admitting: Family Medicine

## 2019-02-01 ENCOUNTER — Other Ambulatory Visit: Payer: Self-pay

## 2019-02-01 ENCOUNTER — Encounter: Payer: Self-pay | Admitting: Family Medicine

## 2019-02-01 VITALS — BP 101/70 | HR 72

## 2019-02-01 DIAGNOSIS — W19XXXD Unspecified fall, subsequent encounter: Secondary | ICD-10-CM

## 2019-02-01 DIAGNOSIS — N183 Chronic kidney disease, stage 3 unspecified: Secondary | ICD-10-CM

## 2019-02-01 DIAGNOSIS — M1A0610 Idiopathic chronic gout, right knee, without tophus (tophi): Secondary | ICD-10-CM

## 2019-02-01 DIAGNOSIS — I10 Essential (primary) hypertension: Secondary | ICD-10-CM

## 2019-02-01 DIAGNOSIS — S82201D Unspecified fracture of shaft of right tibia, subsequent encounter for closed fracture with routine healing: Secondary | ICD-10-CM

## 2019-02-01 DIAGNOSIS — I5032 Chronic diastolic (congestive) heart failure: Secondary | ICD-10-CM

## 2019-02-01 DIAGNOSIS — E1169 Type 2 diabetes mellitus with other specified complication: Secondary | ICD-10-CM | POA: Diagnosis not present

## 2019-02-01 DIAGNOSIS — E782 Mixed hyperlipidemia: Secondary | ICD-10-CM

## 2019-02-01 DIAGNOSIS — E034 Atrophy of thyroid (acquired): Secondary | ICD-10-CM

## 2019-02-01 DIAGNOSIS — E669 Obesity, unspecified: Secondary | ICD-10-CM

## 2019-02-01 DIAGNOSIS — S82401D Unspecified fracture of shaft of right fibula, subsequent encounter for closed fracture with routine healing: Secondary | ICD-10-CM

## 2019-02-01 NOTE — Progress Notes (Signed)
Name: Drew [name]   MRN: [id number]    DOB: 1941-01-04   Date:02/01/2019       Progress Note  Subjective  Chief Complaint  Chief Complaint  Patient presents with  . Follow-up    6 month  . Foot Pain    right fellon Thanksgiving    I connected with  Drew [name] on 02/01/19 at  8:20 AM EST by telephone and verified that I am speaking with the correct person using two identifiers.  I discussed the limitations, risks, security and privacy concerns of performing an evaluation and management service by telephone and the availability of in person appointments. Staff also discussed with the patient that there may be a patient responsible charge related to this service. Patient Location: Home Provider Location: Office Additional Individuals present: None  HPI  Fall/RIGHT foot fracture: He is seeing podiatrist, wearing a boot and using crutches and walker.  He is having some trouble getting around with the crutches - recommend using the walker primarily.  His pain has been elevated today because he bumped his foot on something while getting out of bed.  Not in PT at this time; has follow up 02/20/2019 and will have repeat imaging at that time.  Hypothyroidism: History: hypothyroidism Current Medication Regimen: 25mcg synthroid Current Symptoms: denies fatigue, weight changes, heat/cold intolerance, bowel/skin changes or CVS symptoms Most recent results are below; we will be repeating labs today. Lab Results  Component Value Date   TSH 2.89 08/02/2018   Diabetes mellitus type 2 Checking sugars?  yes How often? Daily Range (low to high) over last two weeks:  153 this morning; eating more sugar recently due to the holidays. Checking feet every day/night?  yes Last eye exam: Has upcoming on 02/06/2019 - we will await results. Denies: Polyuria, polydipsia, polyphagia, vision changes, or neuropathy. Most recent A1C:  Lab Results  Component Value Date   HGBA1C 5.9 (H) 08/02/2018    We will recheck today. Last CMP Results : is due for repeat today    Component Value Date/Time   NA 140 08/02/2018 1114   K 4.1 08/02/2018 1114   CL 107 08/02/2018 1114   CO2 23 08/02/2018 1114   GLUCOSE 104 (H) 08/02/2018 1114   BUN 14 08/02/2018 1114   CREATININE 1.30 (H) 10/23/2018 1501   CREATININE 1.34 (H) 08/02/2018 1114   CALCIUM 9.4 08/02/2018 1114   PROT 6.6 08/02/2018 1114   ALBUMIN 4.6 02/09/2016 0924   AST 21 08/02/2018 1114   ALT 23 08/02/2018 1114   ALKPHOS 48 02/09/2016 0924   BILITOT 1.0 08/02/2018 1114   GFRNONAA 50 (L) 08/02/2018 1114   GFRAA 58 (L) 08/02/2018 1114   Urine Micro UTD? Yes Current Medication Management: Diabetic Medications: victoza 1.2mg  daily ACEI/ARB: Yes Statin: Yes Aspirin therapy: Yes  CKD Stage III: Last labs showed stable function; drinking enough water and following DASH diet. Taking ARB Losartan daily.  Seeing nephrology.  HLD: Denies chest pain, shortness of breath, or myalgias. Taking crestor daily with no concerns.  HTN:  -does take medications as prescribed - current regimen includes Losartan, HCTZ, amlodipine, and Coreg. Seeing Dr. Okey DupreEnd - last visit was November 2020. - taking medications as instructed, no medication side effects noted, no TIAs, no chest pain on exertion, no dyspnea on exertion, no swelling of ankles, no palpitations - DASH diet discussed - pt does follow a low sodium diet  CHF: Has CHF and is seeing Dr. Okey DupreEnd - last visit was  November 2020 - was told to maintain current regimen and sodium restriction.  Taking Coreg. Denies chest pain or shortness of breath..  He is following a low salt diet.  Gout: Taking allopurinol and doing well, no recent outbreaks.   Patient Active Problem List   Diagnosis Date Noted  . Stable angina (HCC) 12/29/2018  . Atypical chest pain 05/29/2018  . Diabetes mellitus type 2 in obese (HCC) 11/30/2017  . Chronic heart failure with preserved ejection fraction (HFpEF) (HCC) 10/20/2017   . Diastolic dysfunction 02/10/2017  . Obstructive sleep apnea 02/10/2017  . Dyspnea on exertion 10/20/2016  . Chronic fatigue 10/20/2016  . Pain in both lower extremities 10/20/2016  . Arm pain, medial, right 08/27/2016  . Encounter for cardiac risk counseling 02/10/2016  . Light-headedness 11/26/2015  . Abnormal ankle brachial index (ABI) 11/26/2015  . Chronic kidney disease, stage III (moderate) 08/15/2015  . H/O: upper GI bleed   . Medication monitoring encounter 08/07/2015  . Obesity (BMI 30-39.9) 08/07/2015  . Hypothyroidism due to acquired atrophy of thyroid 03/11/2015  . History of prostatitis 11/06/2014  . Essential hypertension 11/06/2014  . Mixed hyperlipidemia 11/06/2014  . 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 Flat feet, bilateral 11/06/2014  . History of anemia 11/06/2014  . Chronic multiple gastric erosions 11/06/2014  . Scrotal swelling 11/06/2014  . 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 Chronic gout of ankle 08/16/2014  . H/O: GI bleed 12/20/2013    Past Surgical History:  Procedure Laterality Date  . CATARACT EXTRACTION, BILATERAL Bilateral 11/18/2017  . COLONOSCOPY N/A 12/27/2013   Procedure: COLONOSCOPY;  Surgeon: Barrie Folk, MD;  Location: [facility] ENDOSCOPY;  Service: Endoscopy;  Laterality: N/A;  . ENTEROSCOPY N/A 12/21/2013   Procedure: ENTEROSCOPY;  Surgeon: Vertell Novak., MD;  Location[facility] ENDOSCOPY;  Service: Endoscopy;  Laterality: N/A;  . ENTEROSCOPY N/A 12/27/2013   Procedure: ENTEROSCOPY;  Surgeon: Barrie Folk, MD;  Location: [facility] ENDOSCOPY;  Service: Endoscopy;  Laterality: N/A;    Family History  Problem Relation Age of Onset  . Hyperlipidemia Father   . Kidney disease Father   . Heart disease Father   . Congestive Heart Failure Mother   . Heart disease Mother   . Hyperlipidemia Daughter   . Hyperlipidemia Son   . Diabetes Son   . Anxiety disorder Daughter   .  Depression Daughter   . Arthritis Daughter     Social History   Socioeconomic History  . Marital status: Married    Spouse name: Not on file  . Number of children: 4  . Years of education: Not on file  . Highest education level: High school graduate  Occupational History  . Occupation: retired  Tobacco Use  . Smoking status: Former Smoker    Types: Cigarettes, Cigars    Quit date: 1990    Years since quitting: 30.9  . Smokeless tobacco: Never Used  . Tobacco comment: Smoked afternoon cigar  Substance and Sexual Activity  . Alcohol use: No  . Drug use: No  . Sexual activity: Yes  Other Topics Concern  . Not on file  Social History Narrative  . Not on file   Social Determinants of Health   Financial Resource Strain: Medium Risk  . Difficulty of Paying Living Expenses: Somewhat hard  Food Insecurity: Food Insecurity Present  . Worried About Programme researcher, broadcasting/film/video in the Last Year: Sometimes true  . Ran Out of  Food in the Last Year: Sometimes true  Transportation Needs: No Transportation Needs  . Lack of Transportation (Medical): No  . Lack of Transportation (Non-Medical): No  Physical Activity: Inactive  . Days of Exercise per Week: 0 days  . Minutes of Exercise per Session: 0 min  Stress: No Stress Concern Present  . Feeling of Stress : Not at all  Social Connections: Slightly Isolated  . Frequency of Communication with Friends and Family: More than three times a week  . Frequency of Social Gatherings with Friends and Family: Once a week  . Attends Religious Services: More than 4 times per year  . Active Member of Clubs or Organizations: No  . Attends Archivist Meetings: Never  . Marital Status: Married  Human resources officer Violence: Not At Risk  . Fear of Current or Ex-Partner: No  . Emotionally Abused: No  . Physically Abused: No  . Sexually Abused: No     Current Outpatient Medications:  .  allopurinol (ZYLOPRIM) 100 MG tablet, Take 100 mg by mouth 2  (two) times daily., Disp: , Rfl:  .  amLODipine (NORVASC) 5 MG tablet, TAKE 1 TABLET BY MOUTH  DAILY, Disp: 90 tablet, Rfl: 2 .  aspirin EC 81 MG tablet, Take 81 mg by mouth daily., Disp: , Rfl:  .  carvedilol (COREG) 3.125 MG tablet, TAKE 1 TABLET BY MOUTH TWO  TIMES DAILY, Disp: 180 tablet, Rfl: 3 .  Coenzyme Q10 (CO Q 10) 100 MG CAPS, Take 100 mg by mouth daily., Disp: , Rfl:  .  famotidine (PEPCID) 20 MG tablet, TAKE 1 TABLET BY MOUTH  DAILY, Disp: 90 tablet, Rfl: 3 .  hydrochlorothiazide (MICROZIDE) 12.5 MG capsule, Take 12.5 mg by mouth 3 (three) times a week., Disp: , Rfl:  .  levothyroxine (SYNTHROID) 25 MCG tablet, TAKE 1 TABLET BY MOUTH IN  THE MORNING, Disp: 90 tablet, Rfl: 3 .  losartan (COZAAR) 25 MG tablet, TAKE 1 TABLET BY MOUTH  DAILY, Disp: 90 tablet, Rfl: 0 .  Omega-3 Fatty Acids (FISH OIL) 1000 MG CAPS, Take 1,000 mg by mouth daily., Disp: , Rfl:  .  rosuvastatin (CRESTOR) 5 MG tablet, TAKE 1 TABLET BY MOUTH  EVERY OTHER DAY, Disp: 45 tablet, Rfl: 3 .  triamcinolone cream (KENALOG) 0.1 %, Apply 1 application topically 2 (two) times daily as needed., Disp: 30 g, Rfl: 0  Allergies  Allergen Reactions  . Niacin And Related Itching and Rash    I personally reviewed active problem list, medication list, allergies, health maintenance, notes from last encounter, lab results with the patient/caregiver today.   ROS  Constitutional: Negative for fever or weight change.  Respiratory: Negative for cough and shortness of breath.   Cardiovascular: Negative for chest pain or palpitations.  Gastrointestinal: Negative for abdominal pain, no bowel changes.  Musculoskeletal: RIGHT foot fractured and ambulating with walker. Skin: Negative for rash.  Neurological: Negative for dizziness or headache.  No other specific complaints in a complete review of systems (except as listed in HPI above).  Objective  Virtual encounter, vitals not obtained.  There is no height or weight on file  to calculate BMI.  Physical Exam  Pulmonary/Chest: Effort normal. No respiratory distress. Speaking in complete sentences Neurological: Pt is alert and oriented to person, place, and time. Speech is normal. Psychiatric: Patient has a normal mood and affect. behavior is normal. Judgment and thought content normal.  No results found for this or any previous visit (from the past 72  hour(s)).  PHQ2/9: Depression screen [facility] 2/9 02/01/2019 08/02/2018 06/01/2018 03/02/2018 01/27/2018  Decreased Interest 0 0 0 0 0  Down, Depressed, Hopeless 0 0 0 0 0  PHQ - 2 Score 0 0 0 0 0  Altered sleeping 0 0 0 0 2  Tired, decreased energy 0 0 0 0 1  Change in appetite 0 0 0 0 0  Feeling bad or failure about yourself  0 0 0 0 0  Trouble concentrating 0 0 0 0 0  Moving slowly or fidgety/restless 0 0 0 0 0  Suicidal thoughts 0 0 0 0 0  PHQ-9 Score 0 0 0 0 3  Difficult doing work/chores Not difficult at all - Not difficult at all - -   PHQ-2/9 Result is negative.    Fall Risk: Fall Risk  02/01/2019 08/02/2018 06/01/2018 03/02/2018 01/27/2018  Falls in the past year? 1 0 0 0 1  Number falls in past yr: 1 0 - 0 1  Comment - - - - tripped in yard  Injury with Fall? 1 0 - 0 0  Follow up Falls evaluation completed - - - Falls evaluation completed;Falls prevention discussed    Assessment & Plan  1. Closed fracture of right tibia and fibula with routine healing, subsequent encounter - Following with Podiatry; discussed safety precautions in the home, using walker to ambulate.  2. Fall, subsequent encounter - Following with Podiatry; discussed safety precautions in the home, using walker to ambulate.  3. Hypothyroidism due to acquired atrophy of thyroid - Continue current regimen. - TSH  4. Diabetes mellitus type 2 in obese (HCC) - COMPLETE METABOLIC PANEL WITH GFR - Hemoglobin A1c  5. Stage 3 chronic kidney disease, unspecified whether stage 3a or 3b CKD - Taking ARB - COMPLETE METABOLIC PANEL WITH GFR   6. Mixed hyperlipidemia - Wants to continue crestor - Lipid panel  7. Essential hypertension - Stable on current regimen; seeing Dr. Okey Dupre  8. Chronic heart failure with preserved ejection fraction (HCC) - Stable on current regimen; seeing Dr. Okey Dupre; reports being euvolemic - no weight changes, minimal to no edema.  9. Idiopathic chronic gout of right knee without tophus - Taking allopurinol  I discussed the assessment and treatment plan with the patient. The patient was provided an opportunity to ask questions and all were answered. The patient agreed with the plan and demonstrated an understanding of the instructions.   The patient was advised to call back or seek an in-person evaluation if the symptoms worsen or if the condition fails to improve as anticipated.  I provided  minutes of non-face-to-face time during this encounter.  Doren Custard, FNP

## 2019-02-13 ENCOUNTER — Ambulatory Visit: Payer: Medicare Other

## 2019-02-14 ENCOUNTER — Other Ambulatory Visit: Payer: Self-pay | Admitting: Physician Assistant

## 2019-02-15 ENCOUNTER — Other Ambulatory Visit: Payer: Self-pay | Admitting: Internal Medicine

## 2019-02-15 ENCOUNTER — Other Ambulatory Visit: Payer: Self-pay

## 2019-02-15 MED ORDER — LOSARTAN POTASSIUM 25 MG PO TABS: 25.0000 mg | ORAL_TABLET | Freq: Every day | ORAL | 0 refills | Status: DC

## 2019-02-15 NOTE — Telephone Encounter (Signed)
Refill sent for Losartan 25 mg 90 day supply.

## 2019-03-01 ENCOUNTER — Ambulatory Visit (INDEPENDENT_AMBULATORY_CARE_PROVIDER_SITE_OTHER): Payer: Medicare Other

## 2019-03-01 VITALS — BP 112/68 | HR 62

## 2019-03-01 DIAGNOSIS — Z0000 Encounter for general adult medical examination without abnormal findings: Secondary | ICD-10-CM

## 2019-03-01 NOTE — Progress Notes (Signed)
Subjective:   Drew [name] is a 79 y.o. male who presents for Medicare Annual/Subsequent preventive examination.  Virtual Visit via Telephone Note  I connected with MORY HERRMAN on 03/01/19 at  2:10 PM EST by telephone and verified that I am speaking with the correct person using two identifiers.  Medicare Annual Wellness visit completed telephonically due to Covid-19 pandemic.   Location: Patient: home Provider: office   I discussed the limitations, risks, security and privacy concerns of performing an evaluation and management service by telephone and the availability of in person appointments. The patient expressed understanding and agreed to proceed.  Some vital signs may be absent or patient reported.   Clemetine Marker, LPN    Review of Systems:   Cardiac Risk Factors include: advanced age (>39men, >39 women);diabetes mellitus;hypertension;dyslipidemia;male gender     Objective:    Vitals: BP 112/68   Pulse 62   There is no height or weight on file to calculate BMI.  Advanced Directives 03/01/2019 01/27/2018 11/30/2016 08/27/2016 02/09/2016 12/31/2015 11/24/2015  Does Patient Have a Medical Advance Directive? Yes No No No Yes No No  Type of Paramedic of [place];Living will - - - - - -  Copy of [place] in Chart? No - copy requested - - - - - -  Would patient like information on creating a medical advance directive? - Yes (MAU/Ambulatory/Procedural Areas - Information given) - - - - No - patient declined information    Tobacco Social History   Tobacco Use  Smoking Status Former Smoker  . Types: Cigarettes, Cigars  . Quit date: 54  . Years since quitting: 31.0  Smokeless Tobacco Never Used  Tobacco Comment   Smoked afternoon cigar     Counseling given: Not Answered Comment: Smoked afternoon cigar   Clinical Intake:  Pre-visit preparation completed: Yes  Pain : 0-10 Pain Score: 10-Worst pain ever Pain Type:  Chronic pain Pain Location: Ankle Pain Orientation: Right Pain Descriptors / Indicators: Aching, Sore Pain Onset: More than a month ago Pain Frequency: Constant     Nutritional Risks: None Diabetes: Yes CBG done?: No Did pt. bring in CBG monitor from home?: No   Nutrition Risk Assessment:  Has the patient had any N/V/D within the last 2 months?  No  Does the patient have any non-healing wounds?  No  Has the patient had any unintentional weight loss or weight gain?  No   Diabetes:  Is the patient diabetic?  Yes  If diabetic, was a CBG obtained today?  No  Did the patient bring in their glucometer from home?  No  How often do you monitor your CBG's? daily.   Financial Strains and Diabetes Management:  Are you having any financial strains with the device, your supplies or your medication? No .  Does the patient want to be seen by Chronic Care Management for management of their diabetes?  No  Would the patient like to be referred to a Nutritionist or for Diabetic Management?  No   Diabetic Exams:  Diabetic Eye Exam: Completed 11/18/17. Overdue for diabetic eye exam. Pt has been advised about the importance in completing this exam. Pt's appt postponed due to Covid, rescheduled for May 2021.  Diabetic Foot Exam: Completed 03/02/18.  How often do you need to have someone help you when you read instructions, pamphlets, or other written materials from your doctor or pharmacy?: 1 - Never  Interpreter Needed?: No  Information entered by ::  Reather Littler LPN  Past Medical History:  Diagnosis Date  . (HFpEF) heart failure with preserved ejection fraction (HCC)    a. 02/2016 Echo: EF nl. Gr1 DD. Mild AI. Mod thickened AoV w/o stenosis. Nl RV size and fxn.  . Chronic kidney disease, stage III (moderate) 08/15/2015  . Diabetes mellitus with complication (HCC)   . GERD (gastroesophageal reflux disease)   . H/O: upper GI bleed 2015  . History of stress test    a. 05/2016 Lexiscan MV: EF  72%, No ischemia-->Low risk.  Marland Kitchen Hypercholesteremia   . Hypertension   . Obesity (BMI 30-39.9) 08/07/2015  . Peripheral vascular disease (HCC) 12/26/2015   Past Surgical History:  Procedure Laterality Date  . CATARACT EXTRACTION, BILATERAL Bilateral 11/18/2017  . COLONOSCOPY N/A 12/27/2013   Procedure: COLONOSCOPY;  Surgeon: Barrie Folk, MD;  Location: [facility] ENDOSCOPY;  Service: Endoscopy;  Laterality: N/A;  . ENTEROSCOPY N/A 12/21/2013   Procedure: ENTEROSCOPY;  Surgeon: Vertell Novak., MD;  Location: [facility] ENDOSCOPY;  Service: Endoscopy;  Laterality: N/A;  . ENTEROSCOPY N/A 12/27/2013   Procedure: ENTEROSCOPY;  Surgeon: Barrie Folk, MD;  Location: [facility] ENDOSCOPY;  Service: Endoscopy;  Laterality: N/A;   Family History  Problem Relation Age of Onset  . Hyperlipidemia Father   . Kidney disease Father   . Heart disease Father   . Congestive Heart Failure Mother   . Heart disease Mother   . Hyperlipidemia Daughter   . Hyperlipidemia Son   . Diabetes Son   . Anxiety disorder Daughter   . Depression Daughter   . Arthritis Daughter    Social History   Socioeconomic History  . Marital status: Married    Spouse name: Not on file  . Number of children: 4  . Years of education: Not on file  . Highest education level: High school graduate  Occupational History  . Occupation: retired  Tobacco Use  . Smoking status: Former Smoker    Types: Cigarettes, Cigars    Quit date: 1990    Years since quitting: 31.0  . Smokeless tobacco: Never Used  . Tobacco comment: Smoked afternoon cigar  Substance and Sexual Activity  . Alcohol use: No  . Drug use: No  . Sexual activity: Yes  Other Topics Concern  . Not on file  Social History Narrative  . Not on file   Social Determinants of Health   Financial Resource Strain: Medium Risk  . Difficulty of Paying Living Expenses: Somewhat hard  Food Insecurity: No Food Insecurity  . Worried About Programme researcher, broadcasting/film/video in the Last Year: Never true    . Ran Out of Food in the Last Year: Never true  Transportation Needs: No Transportation Needs  . Lack of Transportation (Medical): No  . Lack of Transportation (Non-Medical): No  Physical Activity: Inactive  . Days of Exercise per Week: 0 days  . Minutes of Exercise per Session: 0 min  Stress: No Stress Concern Present  . Feeling of Stress : Not at all  Social Connections: Slightly Isolated  . Frequency of Communication with Friends and Family: More than three times a week  . Frequency of Social Gatherings with Friends and Family: Once a week  . Attends Religious Services: More than 4 times per year  . Active Member of Clubs or Organizations: No  . Attends Banker Meetings: Never  . Marital Status: Married    Outpatient Encounter Medications as of 03/01/2019  Medication Sig  . allopurinol (ZYLOPRIM)  100 MG tablet Take 100 mg by mouth 2 (two) times daily.  Marland Kitchen amLODipine (NORVASC) 5 MG tablet TAKE 1 TABLET BY MOUTH  DAILY  . aspirin EC 81 MG tablet Take 81 mg by mouth daily.  . carvedilol (COREG) 3.125 MG tablet TAKE 1 TABLET BY MOUTH TWO  TIMES DAILY  . Coenzyme Q10 (CO Q 10) 100 MG CAPS Take 100 mg by mouth daily.  . famotidine (PEPCID) 20 MG tablet TAKE 1 TABLET BY MOUTH  DAILY  . hydrochlorothiazide (MICROZIDE) 12.5 MG capsule Take 12.5 mg by mouth 3 (three) times a week.  . levothyroxine (SYNTHROID) 25 MCG tablet TAKE 1 TABLET BY MOUTH IN  THE MORNING  . liraglutide (VICTOZA) 18 MG/3ML SOPN Inject 1.2 mg into the skin daily.  Marland Kitchen losartan (COZAAR) 25 MG tablet Take 1 tablet (25 mg total) by mouth daily.  . Omega-3 Fatty Acids (FISH OIL) 1000 MG CAPS Take 1,000 mg by mouth daily.  . rosuvastatin (CRESTOR) 5 MG tablet TAKE 1 TABLET BY MOUTH  EVERY OTHER DAY  . triamcinolone cream (KENALOG) 0.1 % Apply 1 application topically 2 (two) times daily as needed.   No facility-administered encounter medications on file as of 03/01/2019.    Activities of Daily Living In your  present state of health, do you have any difficulty performing the following activities: 03/01/2019 08/02/2018  Hearing? N N  Comment declines hearing aids -  Vision? N N  Difficulty concentrating or making decisions? N N  Walking or climbing stairs? N N  Dressing or bathing? N N  Doing errands, shopping? N N  Preparing Food and eating ? N -  Using the Toilet? N -  In the past six months, have you accidently leaked urine? N -  Do you have problems with loss of bowel control? N -  Managing your Medications? N -  Managing your Finances? N -  Housekeeping or managing your Housekeeping? N -  Some recent data might be hidden    Patient Care Team: Doren Custard, FNP as PCP - General (Family Medicine) End, Cristal Deer, MD as PCP - Cardiology (Cardiology)   Assessment:   This is a routine wellness examination for [place].  Exercise Activities and Dietary recommendations Current Exercise Habits: The patient does not participate in regular exercise at present, Exercise limited by: orthopedic condition(s)(recent broken ankle)  Goals    . DIET - EAT MORE FRUITS AND VEGETABLES     Recommend eating 3-4 servings of fruits and vegetables per day    . Prevent falls     Pt advised to install railing outside home for steps and grab bars in the shower.        Fall Risk Fall Risk  03/01/2019 02/01/2019 08/02/2018 06/01/2018 03/02/2018  Falls in the past year? 1 1 0 0 0  Number falls in past yr: 1 1 0 - 0  Comment - - - - -  Injury with Fall? 1 1 0 - 0  Follow up Falls prevention discussed Falls evaluation completed - - -   FALL RISK PREVENTION PERTAINING TO THE HOME:  Any stairs in or around the home? Yes  If so, do they handrails? Yes   Home free of loose throw rugs in walkways, pet beds, electrical cords, etc? Yes  Adequate lighting in your home to reduce risk of falls? Yes   ASSISTIVE DEVICES UTILIZED TO PREVENT FALLS:  Life alert? No  Use of a cane, walker or w/c? Yes  Grab bars in  the  bathroom? Yes  Shower chair or bench in shower? No  Elevated toilet seat or a handicapped toilet? No   DME ORDERS:  DME order needed?  No   TIMED UP AND GO:  Was the test performed? No . Telephonic visit.   Education: Fall risk prevention has been discussed.  Intervention(s) required? No   Depression Screen PHQ 2/9 Scores 03/01/2019 02/01/2019 08/02/2018 06/01/2018  PHQ - 2 Score 0 0 0 0  PHQ- 9 Score - 0 0 0    Cognitive Function     6CIT Screen 03/01/2019 01/27/2018  What Year? 0 points 0 points  What month? 0 points 0 points  What time? 0 points 0 points  Count back from 20 0 points 0 points  Months in reverse 0 points 0 points  Repeat phrase 2 points 2 points  Total Score 2 2    Immunization History  Administered Date(s) Administered  . Influenza, High Dose Seasonal PF 11/06/2014, 11/24/2015, 11/30/2016, 11/30/2017  . Influenza-Unspecified 12/29/2018  . Pneumococcal Conjugate-13 12/14/2013  . Pneumococcal Polysaccharide-23 10/02/2009    Qualifies for Shingles Vaccine? Yes  . Due for Shingrix. Education has been provided regarding the importance of this vaccine. Pt has been advised to call insurance company to determine out of pocket expense. Advised may also receive vaccine at local pharmacy or Health Dept. Verbalized acceptance and understanding.  Tdap: Although this vaccine is not a covered service during a Wellness Exam, does the patient still wish to receive this vaccine today?  No .  Education has been provided regarding the importance of this vaccine. Advised may receive this vaccine at local pharmacy or Health Dept. Aware to provide a copy of the vaccination record if obtained from local pharmacy or Health Dept. Verbalized acceptance and understanding.  Flu Vaccine: Up to date  Pneumococcal Vaccine: Up to date    Screening Tests Health Maintenance  Topic Date Due  . TETANUS/TDAP  07/10/1959  . OPHTHALMOLOGY EXAM  11/19/2018  . HEMOGLOBIN A1C  02/01/2019    . FOOT EXAM  03/03/2019  . INFLUENZA VACCINE  Completed  . PNA vac Low Risk Adult  Completed   Cancer Screenings:  Colorectal Screening: Completed 07/03/14. No longer required.   Lung Cancer Screening: (Low Dose CT Chest recommended if Age 68-80 years, 30 pack-year currently smoking OR have quit w/in 15years.) does not qualify.   Additional Screening:  Hepatitis C Screening: no longer required  Vision Screening: Recommended annual ophthalmology exams for early detection of glaucoma and other disorders of the eye. Is the patient up to date with their annual eye exam?  No   - postponed due to Covid Who is the provider or what is the name of the office in which the pt attends annual [facility] exams[facility]   Dental Screening: Recommended annual dental exams for proper oral hygiene  Community Resource Referral:  CRR required this visit?  No       Plan:    I have personally reviewed and addressed the Medicare Annual Wellness questionnaire and have noted the following in the patient's chart:  A. Medical and social history B. Use of alcohol, tobacco or illicit drugs  C. Current medications and supplements D. Functional ability and status E.  Nutritional status F.  Physical activity G. Advance directives H. List of other physicians I.  Hospitalizations, surgeries, and ER visits in previous 12 months J.  Vitals K. Screenings such as hearing and vision if needed, cognitive and depression L.  Referrals and appointments   In addition, I have reviewed and discussed with patient certain preventive protocols, quality metrics, and best practice recommendations. A written personalized care plan for preventive services as well as general preventive health recommendations were provided to patient.   Signed,  Reather Littler, LPN Nurse Health Advisor   Nurse Notes: none

## 2019-03-01 NOTE — Patient Instructions (Signed)
[name] , Thank you for taking time to come for your Medicare Wellness Visit. I appreciate your ongoing commitment to your health goals. Please review the following plan we discussed and let me know if I can assist you in the future.   Screening recommendations/referrals: Colonoscopy: no longer required Recommended yearly ophthalmology/optometry visit for glaucoma screening and checkup Recommended yearly dental visit for hygiene and checkup  Vaccinations: Influenza vaccine: done 12/29/18 Pneumococcal vaccine: done 12/14/13 Tdap vaccine: due Shingles vaccine: Shingrix discussed. Please contact your pharmacy for coverage information.   Advanced directives: Please bring a copy of your health care power of attorney and living will to the office at your convenience.  Conditions/risks identified: Recommend eating 3-4 servings of fruits and vegetables per day  Next appointment: Please follow up in one year for your Medicare Annual Wellness visit.    Preventive Care 38 Years and Older, Male Preventive care refers to lifestyle choices and visits with your health care provider that can promote health and wellness. What does preventive care include?  A yearly physical exam. This is also called an annual well check.  Dental exams once or twice a year.  Routine eye exams. Ask your health care provider how often you should have your eyes checked.  Personal lifestyle choices, including:  Daily care of your teeth and gums.  Regular physical activity.  Eating a healthy diet.  Avoiding tobacco and drug use.  Limiting alcohol use.  Practicing safe sex.  Taking low doses of aspirin every day.  Taking vitamin and mineral supplements as recommended by your health care provider. What happens during an annual well check? The services and screenings done by your health care provider during your annual well check will depend on your age, overall health, lifestyle risk factors, and family  history of disease. Counseling  Your health care provider may ask you questions about your:  Alcohol use.  Tobacco use.  Drug use.  Emotional well-being.  Home and relationship well-being.  Sexual activity.  Eating habits.  History of falls.  Memory and ability to understand (cognition).  Work and work [name]. Screening  You may have the following tests or measurements:  Height, weight, and BMI.  Blood pressure.  Lipid and cholesterol levels. These may be checked every 5 years, or more frequently if you are over 44 years old.  Skin check.  Lung cancer screening. You may have this screening every year starting at age 66 if you have a 30-pack-year history of smoking and currently smoke or have quit within the past 15 years.  Fecal occult blood test (FOBT) of the stool. You may have this test every year starting at age 64.  Flexible sigmoidoscopy or colonoscopy. You may have a sigmoidoscopy every 5 years or a colonoscopy every 10 years starting at age 35.  Prostate cancer screening. Recommendations will vary depending on your family history and other risks.  Hepatitis C blood test.  Hepatitis B blood test.  Sexually transmitted disease (STD) testing.  Diabetes screening. This is done by checking your blood sugar (glucose) after you have not eaten for a while (fasting). You may have this done every 1-3 years.  Abdominal aortic aneurysm (AAA) screening. You may need this if you are a current or former smoker.  Osteoporosis. You may be screened starting at age 22 if you are at high risk. Talk with your health care provider about your test results, treatment options, and if necessary, the need for more tests. Vaccines  Your health  care provider may recommend certain vaccines, such as:  Influenza vaccine. This is recommended every year.  Tetanus, diphtheria, and acellular pertussis (Tdap, Td) vaccine. You may need a Td booster every 10 years.  Zoster vaccine.  You may need this after age 89.  Pneumococcal 13-valent conjugate (PCV13) vaccine. One dose is recommended after age 45.  Pneumococcal polysaccharide (PPSV23) vaccine. One dose is recommended after age 69. Talk to your health care provider about which screenings and vaccines you need and how often you need them. This information is not intended to replace advice given to you by your health care provider. Make sure you discuss any questions you have with your health care provider. Document Released: 03/07/2015 Document Revised: 10/29/2015 Document Reviewed: 12/10/2014 Elsevier Interactive Patient Education  2017 [place] Prevention in the Home Falls can cause injuries. They can happen to people of all ages. There are many things you can do to make your home safe and to help prevent falls. What can I do on the outside of my home?  Regularly fix the edges of walkways and driveways and fix any cracks.  Remove anything that might make you trip as you walk through a door, such as a raised step or threshold.  Trim any bushes or trees on the path to your home.  Use bright outdoor lighting.  Clear any walking paths of anything that might make someone trip, such as rocks or tools.  Regularly check to see if handrails are loose or broken. Make sure that both sides of any steps have handrails.  Any raised decks and porches should have guardrails on the edges.  Have any leaves, snow, or ice cleared regularly.  Use sand or salt on walking paths during winter.  Clean up any spills in your garage right away. This includes oil or grease spills. What can I do in the bathroom?  Use night lights.  Install grab bars by the toilet and in the tub and shower. Do not use towel bars as grab bars.  Use non-skid mats or decals in the tub or shower.  If you need to sit down in the shower, use a plastic, non-slip stool.  Keep the floor dry. Clean up any water that spills on the floor as soon  as it happens.  Remove soap buildup in the tub or shower regularly.  Attach bath mats securely with double-sided non-slip rug tape.  Do not have throw rugs and other things on the floor that can make you trip. What can I do in the bedroom?  Use night lights.  Make sure that you have a light by your bed that is easy to reach.  Do not use any sheets or blankets that are too big for your bed. They should not hang down onto the floor.  Have a firm chair that has side arms. You can use this for support while you get dressed.  Do not have throw rugs and other things on the floor that can make you trip. What can I do in the kitchen?  Clean up any spills right away.  Avoid walking on wet floors.  Keep items that you use a lot in easy-to-reach places.  If you need to reach something above you, use a strong step stool that has a grab bar.  Keep electrical cords out of the way.  Do not use floor polish or wax that makes floors slippery. If you must use wax, use non-skid floor wax.  Do not have  throw rugs and other things on the floor that can make you trip. What can I do with my stairs?  Do not leave any items on the stairs.  Make sure that there are handrails on both sides of the stairs and use them. Fix handrails that are broken or loose. Make sure that handrails are as long as the stairways.  Check any carpeting to make sure that it is firmly attached to the stairs. Fix any carpet that is loose or worn.  Avoid having throw rugs at the top or bottom of the stairs. If you do have throw rugs, attach them to the floor with carpet tape.  Make sure that you have a light switch at the top of the stairs and the bottom of the stairs. If you do not have them, ask someone to add them for you. What else can I do to help prevent falls?  Wear shoes that:  Do not have high heels.  Have rubber bottoms.  Are comfortable and fit you well.  Are closed at the toe. Do not wear sandals.  If  you use a stepladder:  Make sure that it is fully opened. Do not climb a closed stepladder.  Make sure that both sides of the stepladder are locked into place.  Ask someone to hold it for you, if possible.  Clearly mark and make sure that you can see:  Any grab bars or handrails.  First and last steps.  Where the edge of each step is.  Use tools that help you move around (mobility aids) if they are needed. These include:  Canes.  Walkers.  Scooters.  Crutches.  Turn on the lights when you go into a dark area. Replace any light bulbs as soon as they burn out.  Set up your furniture so you have a clear path. Avoid moving your furniture around.  If any of your floors are uneven, fix them.  If there are any pets around you, be aware of where they are.  Review your medicines with your doctor. Some medicines can make you feel dizzy. This can increase your chance of falling. Ask your doctor what other things that you can do to help prevent falls. This information is not intended to replace advice given to you by your health care provider. Make sure you discuss any questions you have with your health care provider. Document Released: 12/05/2008 Document Revised: 07/17/2015 Document Reviewed: 03/15/2014 Elsevier Interactive Patient Education  2017 [employer].

## 2019-03-07 ENCOUNTER — Other Ambulatory Visit: Payer: Self-pay

## 2019-03-07 MED ORDER — ROSUVASTATIN CALCIUM 5 MG PO TABS: 5.0000 mg | ORAL_TABLET | ORAL | 0 refills | Status: DC

## 2019-04-02 ENCOUNTER — Ambulatory Visit (INDEPENDENT_AMBULATORY_CARE_PROVIDER_SITE_OTHER): Payer: Medicare Other | Admitting: Internal Medicine

## 2019-04-02 ENCOUNTER — Encounter: Payer: Self-pay | Admitting: Internal Medicine

## 2019-04-02 DIAGNOSIS — G4733 Obstructive sleep apnea (adult) (pediatric): Secondary | ICD-10-CM

## 2019-04-02 NOTE — Progress Notes (Signed)
PULMONARY/SLEEP OFFICE FOLLOW-UP NOTE   I connected with the patient by telephone enabled telemedicine visit and verified that I am speaking with the correct person using two identifiers.    I discussed the limitations, risks, security and privacy concerns of performing an evaluation and management service by telemedicine and the availability of in-person appointments. I also discussed with the patient that there may be a patient responsible charge related to this service. The patient expressed understanding and agreed to proceed.  PATIENT AGREES AND CONFIRMS -YES   Other persons participating in the visit and their role in the encounter: Patient, nursing  This visit type was conducted due to national recommendations for restrictions regarding the COVID-19 Pandemic (e.g. social distancing).  This format is felt to be most appropriate for this patient at this time.  All issues noted in this document were discussed and addressed.         Requesting MD/Service: Yvonne Kendall, MD Date of initial consultation: 08/31 Reason for consultation: Snoring, obesity, suspected OSA  PT PROFILE: 79 y.o. male former smoker referred for evaluation of fatigue and daytime sleepiness  DATA: 03/17/16 echocardiogram: Grade 1 diastolic dysfunction. Otherwise normal. 11/11/16 PSG: AHI 29.5/hr. AutoSet 5-20 cm H2O recommended 01/06-02/04/20 CPAP compliance: Usage 30/30.  >4 hours: 30/30.  Median pressure 6.7 cm H2O.  Mean AHI 1.3/hour.  CC FOLLOW UP OSA  HPI DOING WELL WITH CPAP MORE ENERGY AND LESS  FATIGUE GETS MORE REFRESHED SLEEP   No evidence of heart failure at this time No evidence or signs of infection at this time No respiratory distress No fevers, chills, nausea, vomiting, diarrhea No evidence of lower extremity edema No evidence hemoptysis  GETS A NEW MASK EVERY MONTH AND WORKING WELL  Compliance report reviewed 30 out of 30 days 100% Greater than 4 hours 100% AHI is down to  2.1 CPAP 5 to 20 cm of water pressure With excellent compliance report  There were no vitals filed for this visit.    Review of Systems:  Gen:  Denies  fever, sweats, chills weight loss  HEENT: Denies blurred vision, double vision, ear pain, eye pain, hearing loss, nose bleeds, sore throat Cardiac:  No dizziness, chest pain or heaviness, chest tightness,edema, No JVD Resp:   No cough, -sputum production, -shortness of breath,-wheezing, -hemoptysis,  Gi: Denies swallowing difficulty, stomach pain, nausea or vomiting, diarrhea, constipation, bowel incontinence Gu:  Denies bladder incontinence, burning urine Ext:   Denies Joint pain, stiffness or swelling Skin: Denies  skin rash, easy bruising or bleeding or hives Endoc:  Denies polyuria, polydipsia , polyphagia or weight change Psych:   Denies depression, insomnia or hallucinations  Other:  All other systems negative   DATA:   BMP Latest Ref Rng & Units 10/23/2018 08/02/2018 11/14/2017  Glucose 65 - 99 mg/dL - 431(V) 400(Q)  BUN 7 - 25 mg/dL - 14 13  Creatinine 6.76 - 1.24 mg/dL 1.95(K) 9.32(I) 7.12  BUN/Creat Ratio 6 - 22 (calc) - 10 -  Sodium 135 - 146 mmol/L - 140 140  Potassium 3.5 - 5.3 mmol/L - 4.1 3.8  Chloride 98 - 110 mmol/L - 107 108  CO2 20 - 32 mmol/L - 23 25  Calcium 8.6 - 10.3 mg/dL - 9.4 9.3    CBC Latest Ref Rng & Units 08/30/2017 02/09/2016 12/28/2013  WBC 3.8 - 10.8 Thousand/uL 6.3 5.9 -  Hemoglobin 13.2 - 17.1 g/dL 45.8 09.9 8.3(J)  Hematocrit 38.5 - 50.0 % 43.9 48.2 26.5(L)  Platelets 140 - 400  Thousand/uL 149 187 -    CXR:  No recent film  IMPRESSION:   No diagnosis found.   PLAN:   OSA CONTINUE CPAP AS PRESCRIBED NASAL MASK   Hypertension - Sleep apnea can contribute to hypertension, therefore treatment of sleep apnea is important part of hypertension management.   COVID-19 EDUCATION: The signs and symptoms of COVID-19 were discussed with the patient and how to seek care for testing.  The  importance of social distancing was discussed today. Hand Washing Techniques and avoid touching face was advised.     MEDICATION ADJUSTMENTS/LABS AND TESTS ORDERED: CONTINUE CPAP AS PRESCRIBED ADVISED TO WEAR CPAP DURING NAP TIME   CURRENT MEDICATIONS REVIEWED AT LENGTH WITH PATIENT TODAY   Patient satisfied with Plan of action and management. All questions answered  Follow up in [place], M.D.  [facility]  Medical Director [place] Director [facility] Cardio-Pulmonary Department

## 2019-04-02 NOTE — Patient Instructions (Signed)
CONTINUE CPAP AS PRESCRIBED ADVISED TO WEAR CPAP DURING NAP TIME

## 2019-04-09 DIAGNOSIS — E1129 Type 2 diabetes mellitus with other diabetic kidney complication: Secondary | ICD-10-CM | POA: Insufficient documentation

## 2019-04-09 DIAGNOSIS — Q6102 Congenital multiple renal cysts: Secondary | ICD-10-CM | POA: Insufficient documentation

## 2019-04-30 ENCOUNTER — Other Ambulatory Visit: Payer: Self-pay

## 2019-04-30 MED ORDER — HYDROCHLOROTHIAZIDE 12.5 MG PO CAPS: 12.5000 mg | ORAL_CAPSULE | ORAL | 3 refills | Status: DC

## 2019-05-04 ENCOUNTER — Encounter: Payer: Self-pay | Admitting: Family Medicine

## 2019-05-04 ENCOUNTER — Other Ambulatory Visit: Payer: Self-pay

## 2019-05-04 ENCOUNTER — Ambulatory Visit (INDEPENDENT_AMBULATORY_CARE_PROVIDER_SITE_OTHER): Payer: Medicare Other | Admitting: Family Medicine

## 2019-05-04 ENCOUNTER — Telehealth: Payer: Self-pay | Admitting: Family Medicine

## 2019-05-04 VITALS — BP 116/80 | HR 71 | Temp 98.1°F | Resp 14 | Ht 68.0 in | Wt 228.3 lb

## 2019-05-04 DIAGNOSIS — E1169 Type 2 diabetes mellitus with other specified complication: Secondary | ICD-10-CM

## 2019-05-04 DIAGNOSIS — E669 Obesity, unspecified: Secondary | ICD-10-CM

## 2019-05-04 DIAGNOSIS — N183 Chronic kidney disease, stage 3 unspecified: Secondary | ICD-10-CM

## 2019-05-04 DIAGNOSIS — G5793 Unspecified mononeuropathy of bilateral lower limbs: Secondary | ICD-10-CM | POA: Insufficient documentation

## 2019-05-04 DIAGNOSIS — I10 Essential (primary) hypertension: Secondary | ICD-10-CM

## 2019-05-04 DIAGNOSIS — M1A0610 Idiopathic chronic gout, right knee, without tophus (tophi): Secondary | ICD-10-CM

## 2019-05-04 DIAGNOSIS — B351 Tinea unguium: Secondary | ICD-10-CM

## 2019-05-04 DIAGNOSIS — E782 Mixed hyperlipidemia: Secondary | ICD-10-CM

## 2019-05-04 DIAGNOSIS — E034 Atrophy of thyroid (acquired): Secondary | ICD-10-CM

## 2019-05-04 DIAGNOSIS — I5032 Chronic diastolic (congestive) heart failure: Secondary | ICD-10-CM

## 2019-05-04 DIAGNOSIS — S82891D Other fracture of right lower leg, subsequent encounter for closed fracture with routine healing: Secondary | ICD-10-CM

## 2019-05-04 DIAGNOSIS — Z5181 Encounter for therapeutic drug level monitoring: Secondary | ICD-10-CM

## 2019-05-04 DIAGNOSIS — G629 Polyneuropathy, unspecified: Secondary | ICD-10-CM | POA: Insufficient documentation

## 2019-05-04 DIAGNOSIS — Z6834 Body mass index (BMI) 34.0-34.9, adult: Secondary | ICD-10-CM

## 2019-05-04 NOTE — Patient Instructions (Signed)
  We will check your labs and call you with results  We will send in your refills  For your ankle try and ask your local pharmacy or [facility] for an ankle brace - an ankle ASO that you can use while you're still recovering from your fracture

## 2019-05-04 NOTE — Chronic Care Management (AMB) (Signed)
  Chronic Care Management   Note  05/04/2019 Name: [name] MRN: [id number] DOB: December 02, 1940  Drew [name] is a 79 y.o. year old male who is a primary care patient of Hubbard Hartshorn, FNP. I reached out to Drew [name] by phone today in response to a referral sent by Mr. [place] Welliver Pender's PCP, Hinda Glatter     Mr. Minasyan was given information about Chronic Care Management services today including:  1. CCM service includes personalized support from designated clinical staff supervised by his physician, including individualized plan of care and coordination with other care providers 2. 24/7 contact phone numbers for assistance for urgent and routine care needs. 3. Service will only be billed when office clinical staff spend 20 minutes or more in a month to coordinate care. 4. Only one practitioner may furnish and bill the service in a calendar month. 5. The patient may stop CCM services at any time (effective at the end of the month) by phone call to the office staff. 6. The patient will be responsible for cost sharing (co-pay) of up to 20% of the service fee (after annual deductible is met).  Patient agreed to services and verbal consent obtained.   Follow up plan: Telephone appointment with care management team member scheduled for:05/08/2019  Glenna Durand, LPN Health Advisor, [place] Management ??[email or web address] ??[phone]

## 2019-05-04 NOTE — Progress Notes (Signed)
Name: Drew [name]   MRN: [id number]    DOB: 1940/08/03   Date:05/04/2019       Progress Note  Chief Complaint  Patient presents with  . Follow-up  . Diabetes  . Hypothyroidism     Subjective:   Drew [name] is a 79 y.o. male, presents to clinic for routine follow up on the conditions listed above.  Hypothyroid - 25 mcg taking daily Lab Results  Component Value Date   TSH 2.89 08/02/2018  He denies fatigue, constipation, change to energy, mood, hair or skin.  He has some swelling to LE and to right ankle secondary to fx, not much different from baseline.  He is having some intermittent pain to LE usually at night, described as hot, burning and some tingling, only happens about once a month  Due for labs today.  He has continued to take levothyroxine at same dose.    Diabetes Mellitus Type II: Hx of being well controlled Currently managing with  victoza 1.2 mg daily, Pt notes good med compliance - however when leaving clinic he discussed with CMA difficulty affording meds and a referral to our pharmacist with the CCM team was ordered This am 11, range from 115-155 Pt has no SE from meds. No hypoglycemic episodes - but some days he skips doses if his blood sugar is 100-110's - explained MOA of meds and encouraged him to use as prescribed - but to f/up with [place] with any GI SE of blood sugars <80 or >150  Denies: Polyuria, polydipsia, polyphagia, vision changes - some pain to legs - see below Recent pertinent labs: Lab Results  Component Value Date   HGBA1C 5.9 (H) 08/02/2018   HGBA1C 5.9 (H) 11/30/2017   HGBA1C 5.8 (H) 08/30/2017  Pt is due DM foot exam and eye exam  - he goes to [facility] - DM eye exam deferred due to COVID multiple times over the past year, he and his wife state they will call to try and get his f/up appt and then let us know if they can't get in and we'll put in a referral to [facility] Foot exam done today - fungal nail disease to right great  toenail - otherwise good sensation and pulses - he does have difficulty reaching feet/inspecting feet, difficulty putting his shoes and socks off and on ACEI/ARB: Yes Statin: Yes  Hypertension:  Currently managed on HCTZ and losartan carvedilol norvasc - Hx of HFpEF and stable angina, sees cardiology - most recent cardiac testing reviewed -January 2018 echo shows past medical history of dyspnea left anterior fascicular block lifelong smoking history, hypertension, diabetes and dyslipidemia ECHO 2018: Left ventricular ejection fraction was 55 to 60%, grade 1 diastolic dysfunction, moderately thickened and moderately calcified aortic valve with no stenosis and mild regurg, mild mitral valve thickening Patient had screening ABIs in the past 2017 they were abnormal study was done by Dr. Luna Glasgow in cardiology which showed the following findings in October 2017 Impressions ABI's are falsely elevated due to medial calcification, bilaterally. The great toe-brachial indices are normal, bilaterally. Pt reports good med compliance and denies any SE.  No lightheadedness, hypotension, syncope.  -Patient states that only very rarely if he bends over producing and then stands up very quickly will feel lightheaded for a second but he usually just sits down and waits a few minutes before he tries to get up and walk has not had any exertional symptoms, orthopnea, PND, syncopal episodes Blood pressure  today is well controlled. BP Readings from Last 3 Encounters:  05/04/19 116/80  03/01/19 112/68  02/01/19 101/70  Pt denies CP, SOB, exertional sx, palpitation, Ha's, visual disturbances Pt has some bilateral LE edema - he states is it not new or changed - he denies any past hx of fluid overload or use of diuretics - he does have f/up with cardiology in the next 2 months  Hyperlipidemia:  Current Medication Regimen:  crestor 5 mg Last Lipids: Lab Results  Component Value Date   CHOL 131 08/02/2018   HDL 30 (L)  08/02/2018   LDLCALC 74 08/02/2018   TRIG 174 (H) 08/02/2018   CHOLHDL 4.4 08/02/2018   - Denies: Chest pain, shortness of breath, myalgias. - he sometimes gets stiff joints worse with weather changes, but no pain to muscles - Risk factors for atherosclerosis: diabetes mellitus, hypercholesterolemia, hypertension and smoking  In November he broke his right ankle and he still having swelling stiffness some instability and is wearing a air splint.  He states that he saw Ortho and has been follow-up and they did start him to wear the air splint he has not done any physical therapy even taking the brace off in the clinic today and having his leg dangle for 10 to 20 minutes he had some increased swelling.  Has a history of gout he points to both of his thumbs and first in CP joints and states that that is where he typically gets these flares he is compliant with his allopurinol, no recent gout flares, those joints are the most sensitive with weather changes   Patient Active Problem List   Diagnosis Date Noted  . Neuropathy involving both lower extremities 05/04/2019  . Multiple renal cysts 04/09/2019  . Type II or unspecified type diabetes mellitus with renal manifestations, not stated as uncontrolled 04/09/2019  . Stable angina (HCC) 12/29/2018  . Atypical chest pain 05/29/2018  . Onychomycosis of multiple toenails with type 2 diabetes mellitus (HCC) 11/30/2017  . Chronic heart failure with preserved ejection fraction (HFpEF) (HCC) 10/20/2017  . Diastolic dysfunction 02/10/2017  . Obstructive sleep apnea 02/10/2017  . Dyspnea on exertion 10/20/2016  . Chronic fatigue 10/20/2016  . Pain in both lower extremities 10/20/2016  . Arm pain, medial, right 08/27/2016  . Encounter for cardiac risk counseling 02/10/2016  . Light-headedness 11/26/2015  . Abnormal ankle brachial index (ABI) 11/26/2015  . Stage 3a chronic kidney disease 08/15/2015  . H/O: upper GI bleed   . Medication monitoring  encounter 08/07/2015  . Class 1 obesity with serious comorbidity and body mass index (BMI) of 34.0 to 34.9 in adult 08/07/2015  . Hypothyroidism due to acquired atrophy of thyroid 03/11/2015  . History of prostatitis 11/06/2014  . Essential hypertension 11/06/2014  . Mixed hyperlipidemia 11/06/2014  . 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 Flat feet, bilateral 11/06/2014  . History of anemia 11/06/2014  . Chronic multiple gastric erosions 11/06/2014  . Scrotal swelling 11/06/2014  . 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 Chronic gout of ankle 08/16/2014  . H/O: GI bleed 12/20/2013    Past Surgical History:  Procedure Laterality Date  . CATARACT EXTRACTION, BILATERAL Bilateral 11/18/2017  . COLONOSCOPY N/A 12/27/2013   Procedure: COLONOSCOPY;  Surgeon: Barrie Folk, MD;  Location[facility] ENDOSCOPY;  Service: Endoscopy;  Laterality: N/A;  . ENTEROSCOPY N/A 12/21/2013   Procedure: ENTEROSCOPY;  Surgeon: Vertell Novak., MD;  Location: [facility] ENDOSCOPY;  Service: Endoscopy;  Laterality: N/A;  . ENTEROSCOPY N/A 12/27/2013   Procedure: ENTEROSCOPY;  Surgeon: Barrie FolkJohn C Hayes, MD;  [facility];  Service: Endoscopy;  Laterality: N/A;    Family History  Problem Relation Age of Onset  . Hyperlipidemia Father   . Kidney disease Father   . Heart disease Father   . Congestive Heart Failure Mother   . Heart disease Mother   . Hyperlipidemia Daughter   . Hyperlipidemia Son   . Diabetes Son   . Anxiety disorder Daughter   . Depression Daughter   . Arthritis Daughter     Social History   Tobacco Use  . Smoking status: Former Smoker    Types: Cigarettes, Cigars    Quit date: 1990    Years since quitting: 31.2  . Smokeless tobacco: Never Used  . Tobacco comment: Smoked afternoon cigar  Substance Use Topics  . Alcohol use: No  . Drug use: No      Current Outpatient Medications:  .  allopurinol (ZYLOPRIM) 100  MG tablet, Take 100 mg by mouth 2 (two) times daily., Disp: , Rfl:  .  amLODipine (NORVASC) 5 MG tablet, TAKE 1 TABLET BY MOUTH  DAILY, Disp: 90 tablet, Rfl: 1 .  aspirin EC 81 MG tablet, Take 81 mg by mouth daily., Disp: , Rfl:  .  carvedilol (COREG) 3.125 MG tablet, TAKE 1 TABLET BY MOUTH TWO  TIMES DAILY, Disp: 180 tablet, Rfl: 3 .  Coenzyme Q10 (CO Q 10) 100 MG CAPS, Take 100 mg by mouth daily., Disp: , Rfl:  .  famotidine (PEPCID) 20 MG tablet, TAKE 1 TABLET BY MOUTH  DAILY, Disp: 90 tablet, Rfl: 3 .  hydrochlorothiazide (MICROZIDE) 12.5 MG capsule, Take 1 capsule (12.5 mg total) by mouth 3 (three) times a week., Disp: 36 capsule, Rfl: 3 .  levothyroxine (SYNTHROID) 25 MCG tablet, TAKE 1 TABLET BY MOUTH IN  THE MORNING, Disp: 90 tablet, Rfl: 3 .  liraglutide (VICTOZA) 18 MG/3ML SOPN, Inject 1.2 mg into the skin daily., Disp: , Rfl:  .  losartan (COZAAR) 25 MG tablet, Take 1 tablet (25 mg total) by mouth daily., Disp: 90 tablet, Rfl: 0 .  Omega-3 Fatty Acids (FISH OIL) 1000 MG CAPS, Take 1,000 mg by mouth daily., Disp: , Rfl:  .  rosuvastatin (CRESTOR) 5 MG tablet, Take 1 tablet (5 mg total) by mouth every other day., Disp: 45 tablet, Rfl: 0 .  triamcinolone cream (KENALOG) 0.1 %, Apply 1 application topically 2 (two) times daily as needed., Disp: 30 g, Rfl: 0  Allergies  Allergen Reactions  . Niacin And Related Itching and Rash    Chart Review Today: I personally reviewed active problem list, medication list, allergies, family history, social history, health maintenance, notes from last encounter, lab results, imaging with the patient/caregiver today.   Review of Systems  10 Systems reviewed and are negative for acute change except as noted in the HPI.  Objective:    Vitals:   05/04/19 0920  BP: 116/80  Pulse: 71  Resp: 14  Temp: 98.1 F (36.7 C)  SpO2: 95%  Weight: 228 lb 4.8 oz (103.6 kg)  Height: 5\' 8"  (1.727 m)    Body mass index is 34.71 kg/m.  Physical  Exam Vitals and nursing note reviewed.  Constitutional:      General: He is not in acute distress.    Appearance: Normal appearance. He is well-developed. He is obese. He is not ill-appearing, toxic-appearing or diaphoretic.  Interventions: Face mask in place.  HENT:     Head: Normocephalic and atraumatic.     Jaw: No trismus.     Right Ear: External ear normal.     Left Ear: External ear normal.  Eyes:     General: Lids are normal. No scleral icterus.    Conjunctiva/sclera: Conjunctivae normal.  Neck:     Trachea: Trachea and phonation normal. No tracheal deviation.  Cardiovascular:     Rate and Rhythm: Normal rate and regular rhythm.     Pulses: Normal pulses.          Radial pulses are 2+ on the right side and 2+ on the left side.       Posterior tibial pulses are 2+ on the right side and 2+ on the left side.     Heart sounds: Normal heart sounds. No murmur. No friction rub. No gallop.   Pulmonary:     Effort: Pulmonary effort is normal. No respiratory distress.     Breath sounds: Normal breath sounds. No stridor. No wheezing, rhonchi or rales.  Abdominal:     General: Bowel sounds are normal. There is no distension.     Palpations: Abdomen is soft.     Tenderness: There is no abdominal tenderness.  Musculoskeletal:        General: Swelling (right ankle mildly swollen) present.     Cervical back: Normal range of motion and neck supple.     Right lower leg: Edema present.     Left lower leg: Edema present.  Skin:    General: Skin is warm and dry.     Capillary Refill: Capillary refill takes less than 2 seconds.     Coloration: Skin is not jaundiced or pale.     Findings: No bruising, erythema or rash.     Nails: There is no clubbing.     Comments: Right great toenail, yellowed, thickened, long, raised nail bed  Neurological:     Mental Status: He is alert.     Cranial Nerves: No dysarthria or facial asymmetry.     Motor: No tremor or abnormal muscle tone.      Coordination: Coordination normal.     Gait: Gait abnormal.  Psychiatric:        Mood and Affect: Mood normal.        Speech: Speech normal.        Behavior: Behavior normal. Behavior is cooperative.       Diabetic Foot Exam: Diabetic Foot Exam - Simple   Simple Foot Form Diabetic Foot exam was performed with the following findings: Yes 05/04/2019  9:40 AM  Visual Inspection Sensation Testing Pulse Check Comments     PHQ2/9: Depression screen [facility] 2/9 05/04/2019 03/01/2019 02/01/2019 08/02/2018 06/01/2018  Decreased Interest 0 0 0 0 0  Down, Depressed, Hopeless 0 0 0 0 0  PHQ - 2 Score 0 0 0 0 0  Altered sleeping 0 - 0 0 0  Tired, decreased energy 0 - 0 0 0  Change in appetite 0 - 0 0 0  Feeling bad or failure about yourself  0 - 0 0 0  Trouble concentrating 0 - 0 0 0  Moving slowly or fidgety/restless 0 - 0 0 0  Suicidal thoughts 0 - 0 0 0  PHQ-9 Score 0 - 0 0 0  Difficult doing work/chores Not difficult at all - Not difficult at all - Not difficult at all  Some recent data might be hidden  phq 9 is neg, reviewed  Fall Risk: Fall Risk  05/04/2019 03/01/2019 02/01/2019 08/02/2018 06/01/2018  Falls in the past year? 1 1 1  0 0  Number falls in past yr: 1 1 1  0 -  Comment - - - - -  Injury with Fall? 1 1 1  0 -  Follow up - Falls prevention discussed Falls evaluation completed - -    Functional Status Survey: Is the patient deaf or have difficulty hearing?: No Does the patient have difficulty seeing, even when wearing glasses/contacts?: No Does the patient have difficulty concentrating, remembering, or making decisions?: No Does the patient have difficulty walking or climbing stairs?: No Does the patient have difficulty dressing or bathing?: No Does the patient have difficulty doing errands alone such as visiting a doctor's office or shopping?: No   Assessment & Plan:     ICD-10-CM   1. Hypothyroidism due to acquired atrophy of thyroid  E03.4 TSH   We will recheck labs  and refill and/or adjust medication as needed  2. Diabetes mellitus type 2 in obese (HCC)  E11.69 Hemoglobin A1c   Z61.0 COMPLETE METABOLIC PANEL WITH GFR    Ambulatory referral to Chronic Care Management Services    Ambulatory referral to Podiatry   Referral to pharmacist continue Victoza history of being well controlled  3. Stage 3 chronic kidney disease, unspecified whether stage 3a or 3b CKD  N18.30 Hemoglobin A1c    COMPLETE METABOLIC PANEL WITH GFR   Monitoring renal function, sees nephrology  4. Mixed hyperlipidemia  E78.2 Lipid panel    COMPLETE METABOLIC PANEL WITH GFR   Compliant with statin, no myalgias though he does complain of joint pain which he believes and related, labs done today  5. Essential hypertension  R60 COMPLETE METABOLIC PANEL WITH GFR   Well-controlled low end of normal blood pressure  6. Chronic heart failure with preserved ejection fraction (HCC)  I50.32    Per cardiology -discussed concerning signs and symptoms including worsening dyspnea, weight gain, orthopnea, lower extremity edema that he should follow-up for  7. Idiopathic chronic gout of right knee without tophus  A5W.0981 COMPLETE METABOLIC PANEL WITH GFR    Uric acid   Patient states well controlled checking chemistry and uric acid today  8. Neuropathy involving both lower extremities  G57.93 TSH    Hemoglobin A1c    Lipid panel    COMPLETE METABOLIC PANEL WITH GFR    CBC with Differential/Platelet   discussed possible causes iron deficiency, b12, DM, chemical hypothyroid - he eats meat, doesnt drink alcohol, denies any hx of anemia - labs pending  9. Closed fracture of right ankle with routine healing, subsequent encounter  S82.891D    encouraged him to find and try an ankle ASO for support and compression - offered PT referral to help with his recovery and mobility  10. Onychomycosis of multiple toenails with type 2 diabetes mellitus ([place])  E11.69 Ambulatory referral to Podiatry   B35.1   11.  Class 1 obesity with serious comorbidity and body mass index (BMI) of 34.0 to 34.9 in adult, unspecified obesity type  E66.9    Z68.34    encouraged healthy diet and getting back to physical activity as he recovers from ankle fx  12. Encounter for medication monitoring  Z51.81 TSH    Hemoglobin A1c    Lipid panel    COMPLETE METABOLIC PANEL WITH GFR    CBC with Differential/Platelet   Greater than 50% of this visit was spent  in direct face-to-face counseling, obtaining history and physical, discussing and educating pt on treatment plan.  Total time of this visit was 45+ min.  Addressing multiple chronic conditions and acute sx and f/up as well.  Remainder of time involved but was not limited to reviewing chart (recent and pertinent OV notes and labs), documentation in EMR, and coordinating care and treatment plan.   Return for 4 month routine f/up PCP.   Danelle Berry, PA-C 05/04/19 10:37 AM

## 2019-05-05 LAB — CBC WITH DIFFERENTIAL/PLATELET
Absolute Monocytes: 627 cells/uL (ref 200–950)
Basophils Absolute: 50 cells/uL (ref 0–200)
Basophils Relative: 0.9 %
Eosinophils Absolute: 207 cells/uL (ref 15–500)
Eosinophils Relative: 3.7 %
HCT: 45.5 % (ref 38.5–50.0)
Hemoglobin: 15.3 g/dL (ref 13.2–17.1)
Lymphs Abs: 1809 cells/uL (ref 850–3900)
MCH: 32.3 pg (ref 27.0–33.0)
MCHC: 33.6 g/dL (ref 32.0–36.0)
MCV: 96.2 fL (ref 80.0–100.0)
MPV: 10.7 fL (ref 7.5–12.5)
Monocytes Relative: 11.2 %
Neutro Abs: 2906 cells/uL (ref 1500–7800)
Neutrophils Relative %: 51.9 %
Platelets: 137 10*3/uL — ABNORMAL LOW (ref 140–400)
RBC: 4.73 10*6/uL (ref 4.20–5.80)
RDW: 14.2 % (ref 11.0–15.0)
Total Lymphocyte: 32.3 %
WBC: 5.6 10*3/uL (ref 3.8–10.8)

## 2019-05-05 LAB — COMPLETE METABOLIC PANEL WITH GFR
AG Ratio: 2.3 (calc) (ref 1.0–2.5)
ALT: 17 U/L (ref 9–46)
AST: 18 U/L (ref 10–35)
Albumin: 4.5 g/dL (ref 3.6–5.1)
Alkaline phosphatase (APISO): 59 U/L (ref 35–144)
BUN/Creatinine Ratio: 11 (calc) (ref 6–22)
BUN: 14 mg/dL (ref 7–25)
CO2: 24 mmol/L (ref 20–32)
Calcium: 9.6 mg/dL (ref 8.6–10.3)
Chloride: 108 mmol/L (ref 98–110)
Creat: 1.22 mg/dL — ABNORMAL HIGH (ref 0.70–1.18)
GFR, Est African American: 65 mL/min/{1.73_m2} (ref >=60)
GFR, Est Non African American: 56 mL/min/{1.73_m2} — ABNORMAL LOW (ref >=60)
Globulin: 2 g/dL (calc) (ref 1.9–3.7)
Glucose, Bld: 105 mg/dL — ABNORMAL HIGH (ref 65–99)
Potassium: 4.1 mmol/L (ref 3.5–5.3)
Sodium: 143 mmol/L (ref 135–146)
Total Bilirubin: 0.8 mg/dL (ref 0.2–1.2)
Total Protein: 6.5 g/dL (ref 6.1–8.1)

## 2019-05-05 LAB — URIC ACID: Uric Acid, Serum: 6.6 mg/dL (ref 4.0–8.0)

## 2019-05-05 LAB — LIPID PANEL
Cholesterol: 146 mg/dL (ref <200)
HDL: 30 mg/dL — ABNORMAL LOW (ref >=40)
LDL Cholesterol (Calc): 85 mg/dL (calc)
Non-HDL Cholesterol (Calc): 116 mg/dL (calc) (ref <130)
Total CHOL/HDL Ratio: 4.9 (calc) (ref <5.0)
Triglycerides: 221 mg/dL — ABNORMAL HIGH (ref <150)

## 2019-05-05 LAB — HEMOGLOBIN A1C
Hgb A1c MFr Bld: 6.2 % of total Hgb — ABNORMAL HIGH (ref <5.7)
Mean Plasma Glucose: 131 (calc)
eAG (mmol/L): 7.3 (calc)

## 2019-05-05 LAB — TSH: TSH: 2.57 mIU/L (ref 0.40–4.50)

## 2019-05-08 ENCOUNTER — Other Ambulatory Visit: Payer: Self-pay | Admitting: Internal Medicine

## 2019-05-08 ENCOUNTER — Telehealth: Payer: Medicare Other

## 2019-05-14 ENCOUNTER — Ambulatory Visit: Payer: Medicare Other | Admitting: Podiatry

## 2019-05-18 ENCOUNTER — Ambulatory Visit: Payer: Self-pay | Admitting: *Deleted

## 2019-05-18 ENCOUNTER — Other Ambulatory Visit: Payer: Self-pay

## 2019-05-18 DIAGNOSIS — E1169 Type 2 diabetes mellitus with other specified complication: Secondary | ICD-10-CM

## 2019-05-18 NOTE — Chronic Care Management (AMB) (Signed)
  Chronic Care Management   Note  05/18/2019 Name: Drew [name] MRN: [id number] DOB: [birth year]  Pharmacy referral for medication assistance forwarded to [facility].   Follow up plan: [facility].    Marja Kays MHA,BSN,RN,CCM Lead Embedded Care Coordination Supervisor [facility] Care Management  [phone]

## 2019-05-22 ENCOUNTER — Other Ambulatory Visit: Payer: Self-pay | Admitting: Family Medicine

## 2019-05-22 ENCOUNTER — Other Ambulatory Visit: Payer: Self-pay | Admitting: Pharmacy Technician

## 2019-05-22 MED ORDER — LIRAGLUTIDE 18 MG/3ML ~~LOC~~ SOPN: 1.2000 mg | PEN_INJECTOR | Freq: Every day | SUBCUTANEOUS | 0 refills | Status: DC

## 2019-05-22 NOTE — Patient Outreach (Signed)
[facility] Customer service manager [facility]) Care Management  05/22/2019  Drew [name] Jun 11, 1940 [id number]                                       Medication Assistance Referral  Referral From: [facility] Tiffany B.  Medication/Company: [employer] Patient application portion:  Mining engineer portion: Faxed  to Dr. Carlynn Purl Provider address/fax verified via: Office website   Follow up:  Will follow up with patient in 10-14 business days to confirm application(s) have been received.  Suzan Slick [name], CPhT Certified Pharmacy Technician [facility] [employer]

## 2019-05-23 ENCOUNTER — Other Ambulatory Visit: Payer: Self-pay | Admitting: Family Medicine

## 2019-05-23 MED ORDER — LIRAGLUTIDE 18 MG/3ML ~~LOC~~ SOPN: 1.2000 mg | PEN_INJECTOR | Freq: Every day | SUBCUTANEOUS | 3 refills | Status: DC

## 2019-06-21 ENCOUNTER — Other Ambulatory Visit: Payer: Self-pay | Admitting: Pharmacy Technician

## 2019-06-21 NOTE — Patient Outreach (Signed)
[facility])   06/21/2019  JACCOB CZAPLICKI [birth year] [id number]   Informed by [facility] RPh Tiffany B that patient is requesting another copy of Novo Nordisk patient assistance application for Victoza.  -Mailed application to pt.  Suzan Slick [name], CPhT Certified Pharmacy Technician [facility]

## 2019-06-22 ENCOUNTER — Telehealth: Payer: Self-pay | Admitting: Family Medicine

## 2019-06-22 NOTE — Chronic Care Management (AMB) (Signed)
  Care Management   Note  06/22/2019 Name: Drew [name] MRN: [id number] DOB: Jul 09, 1940  Drew [name] is a 79 y.o. year old male who is a primary care patient of Doren Custard, FNP and is actively engaged with the care management team. I reached out to Drew [name] by phone today to assist with scheduling an initial visit with the Pharmacist  Follow up plan: Unsuccessful telephone outreach attempt made. A HIPPA compliant phone message was left for the patient providing contact information and requesting a return call. The care management team will reach out to the patient again over the next 7 days.  If patient returns call to provider office, please advise to call Embedded Care Management Care Guide Gwenevere Ghazi at [phone].  Gwenevere Ghazi  Care Guide, Embedded Care Coordination [facility]  [place], [place] [address] Direct Dial: [phone] [email or web address] Website: [email or web address]

## 2019-06-25 LAB — HM DIABETES EYE EXAM

## 2019-06-25 NOTE — Chronic Care Management (AMB) (Signed)
  Care Management   Note  06/25/2019 Name: [name] MRN: [id number] DOB: 1940-02-28  Drew [name] is a 79 y.o. year old male who is a primary care patient of Doren Custard, FNP and is actively engaged with the care management team. I reached out to Drew [name] by phone today to assist with scheduling an initial visit with the Pharmacist  Follow up plan: Telephone appointment with care management team member scheduled for: 07/11/2019.  Gwenevere Ghazi  Care Guide, Embedded Care Coordination [facility]  [place], [place] [address] Direct Dial: [phone] [email or web address] Website: [email or web address]

## 2019-06-28 ENCOUNTER — Ambulatory Visit (INDEPENDENT_AMBULATORY_CARE_PROVIDER_SITE_OTHER): Payer: Medicare Other | Admitting: Internal Medicine

## 2019-06-28 ENCOUNTER — Other Ambulatory Visit: Payer: Self-pay

## 2019-06-28 ENCOUNTER — Encounter: Payer: Self-pay | Admitting: Internal Medicine

## 2019-06-28 VITALS — BP 144/84 | HR 66 | Ht 68.0 in | Wt 234.0 lb

## 2019-06-28 DIAGNOSIS — I10 Essential (primary) hypertension: Secondary | ICD-10-CM

## 2019-06-28 DIAGNOSIS — I5032 Chronic diastolic (congestive) heart failure: Secondary | ICD-10-CM

## 2019-06-28 DIAGNOSIS — E782 Mixed hyperlipidemia: Secondary | ICD-10-CM | POA: Diagnosis not present

## 2019-06-28 DIAGNOSIS — R079 Chest pain, unspecified: Secondary | ICD-10-CM

## 2019-06-28 DIAGNOSIS — N1831 Chronic kidney disease, stage 3a: Secondary | ICD-10-CM

## 2019-06-28 NOTE — Patient Instructions (Signed)

## 2019-06-28 NOTE — Progress Notes (Signed)
Follow-up Outpatient Visit Date: 06/28/2019  Primary Care Provider: Doren Custard, FNP [address] [place] [address]  Chief Complaint: Follow-up chronic HFpEF  HPI:  Drew [name] is a 79 y.o. male with history of HFpEF, hypertension, hyperlipidemia, chronic kidney disease stage III, GERD, and obesity, who presents for follow-up of HFpEF.  I last saw [name] in 12/2018, at which time he was feeling well without shortness of breath and lightheadedness.  He also reported few episodes of mild chest heaviness when overexerting himself in the yard, unchanged from prior visits.  We did not make any medication changes or pursue further testing at that time.  Today, [name] reports feeling relatively well.  He wonders if he is having issues with his diabetes, as he sometimes feels cold after taking Victoza when his blood sugars are in the low 100s.  He has never checked his blood sugars later in the day when he begins to feel cold to see if he is hypoglycemic.  This has prompted him to intermittently skip taking this medication.  He otherwise feels relatively well with stable exertional dyspnea.  He notes a single episode of chest pain and shortness of breath that occurred while trying to get and soft his leg after stepping into an ants nest.  He denies palpitations, lightheadedness, and edema.  Home blood pressures are typically in the 115 to 125 mmHg systolic range.  He is tolerating his medications well.  At times, [name] is aware of his heart beating when he lies down in bed.  This seems to be positional, as the sensation resolves when he rolls in bed.  --------------------------------------------------------------------------------------------------  Cardiovascular History & Procedures: Cardiovascular Problems:  Dyspnea on exertion and chest pain  Leg pain  Risk Factors:  Hypertension, hyperlipidemia, diabetes mellitus, male gender, and age >  68  Cath/PCI:  None  CV Surgery:  None  EP Procedures and Devices:  None  Non-Invasive Evaluation(s):  Pharmacologic MPI (06/09/16): Low risk study without ischemia. Normal wall motion with LVEF of 72%.  TTE (03/17/16): Normal LV size percent. There is grade 1 diastolic dysfunction. Aortic valve is moderately thickened without stenosis. There is mild aortic regurgitation. Normal RV size and function.  Lower extremity arterial vascular study (12/22/15): ABIs right 1.5, left 1.6. TBIsright 1.3, left 1.1.  Recent CV Pertinent Labs: Lab Results  Component Value Date   CHOL 146 05/04/2019   HDL 30 (L) 05/04/2019   LDLCALC 85 05/04/2019   TRIG 221 (H) 05/04/2019   CHOLHDL 4.9 05/04/2019   INR 1.32 12/22/2013   K 4.1 05/04/2019   MG 1.9 12/21/2013   BUN 14 05/04/2019   CREATININE 1.22 (H) 05/04/2019    Past medical and surgical history were reviewed and updated in EPIC.  Current Meds  Medication Sig  . allopurinol (ZYLOPRIM) 100 MG tablet Take 100 mg by mouth 2 (two) times daily.  Marland Kitchen amLODipine (NORVASC) 5 MG tablet TAKE 1 TABLET BY MOUTH  DAILY  . aspirin EC 81 MG tablet Take 81 mg by mouth daily.  . carvedilol (COREG) 3.125 MG tablet TAKE 1 TABLET BY MOUTH TWO  TIMES DAILY  . Coenzyme Q10 (CO Q 10) 100 MG CAPS Take 100 mg by mouth daily.  . famotidine (PEPCID) 20 MG tablet TAKE 1 TABLET BY MOUTH  DAILY  . hydrochlorothiazide (MICROZIDE) 12.5 MG capsule Take 1 capsule (12.5 mg total) by mouth 3 (three) times a week.  . levothyroxine (SYNTHROID) 25 MCG tablet TAKE  1 TABLET BY MOUTH IN  THE MORNING  . liraglutide (VICTOZA) 18 MG/3ML SOPN Inject 0.2 mLs (1.2 mg total) into the skin daily.  Marland Kitchen losartan (COZAAR) 25 MG tablet Take 1 tablet (25 mg total) by mouth daily.  . Omega-3 Fatty Acids (FISH OIL) 1000 MG CAPS Take 1,000 mg by mouth daily.  . rosuvastatin (CRESTOR) 5 MG tablet TAKE 1 TABLET BY MOUTH  EVERY OTHER DAY  . triamcinolone cream (KENALOG) 0.1 % Apply 1  application topically 2 (two) times daily as needed.    Allergies: Niacin and related  Social History   Tobacco Use  . Smoking status: Former Smoker    Types: Cigarettes, Cigars    Quit date: 1990    Years since quitting: 31.3  . Smokeless tobacco: Never Used  . Tobacco comment: Smoked afternoon cigar  Substance Use Topics  . Alcohol use: No  . Drug use: No    Family History  Problem Relation Age of Onset  . Hyperlipidemia Father   . Kidney disease Father   . Heart disease Father   . Congestive Heart Failure Mother   . Heart disease Mother   . Hyperlipidemia Daughter   . Hyperlipidemia Son   . Diabetes Son   . Anxiety disorder Daughter   . Depression Daughter   . Arthritis Daughter     Review of Systems: A 12-system review of systems was performed and was negative except as noted in the HPI.  --------------------------------------------------------------------------------------------------  Physical Exam: BP (!) 144/84 (BP Location: Left Arm, Patient Position: Sitting, Cuff Size: Normal)   Pulse 66   Ht 5\' 8"  (1.727 m)   Wt 234 lb (106.1 kg)   SpO2 94%   BMI 35.58 kg/m   General: NAD. HEENT: No conjunctival pallor or scleral icterus. Facemask in place. Neck: No JVD or HJR. Lungs: Normal work of breathing. Clear to auscultation bilaterally without wheezes or crackles. Heart: Regular rate and rhythm without murmurs, rubs, or gallops. Abd: Bowel sounds present. Soft, NT/ND. Ext: No lower extremity edema.  EKG: Normal sinus rhythm with low voltage, left axis deviation, and poor R wave progression likely related to lead placement.  Heart rate has increased since 12/29/2018.  Otherwise, no significant interval change.  Lab Results  Component Value Date   WBC 5.6 05/04/2019   HGB 15.3 05/04/2019   HCT 45.5 05/04/2019   MCV 96.2 05/04/2019   PLT 137 (L) 05/04/2019    Lab Results  Component Value Date   NA 143 05/04/2019   K 4.1 05/04/2019   CL 108  05/04/2019   CO2 24 05/04/2019   BUN 14 05/04/2019   CREATININE 1.22 (H) 05/04/2019   GLUCOSE 105 (H) 05/04/2019   ALT 17 05/04/2019    Lab Results  Component Value Date   CHOL 146 05/04/2019   HDL 30 (L) 05/04/2019   LDLCALC 85 05/04/2019   TRIG 221 (H) 05/04/2019   CHOLHDL 4.9 05/04/2019    --------------------------------------------------------------------------------------------------  ASSESSMENT AND PLAN: Chronic HFpEF: Overall, Mr. Narine appears euvolemic with stable NYHA class II symptoms.  He has put on approximately 10 pounds over the last year, which is likely contributing to some of his chronic exertional dyspnea.  I have encouraged him to lose weight through diet and exercise.  Chest pain: Mr. Wolfson reports only a single episode of brief chest pain in the setting of trying to get ants off of his leg.  Given isolated episode and nonischemic stress test in 2018, we have agreed to  defer additional testing at this time.  Hypertension: Blood pressure mildly elevated today but typically better at home and at prior office visits.  I encouraged Mr. Tulloch to work on lifestyle modifications.  We will defer medication changes at this time.  Hyperlipidemia: LDL reasonable on last check in March at 50.  Continue low-dose rosuvastatin.  Morbid obesity: BMI greater than 35 with multiple comorbidities (diabetes mellitus, HFpEF, and hypertension).  I encouraged Mr. Shader to work on weight loss through diet and exercise.  Chronic kidney disease stage III: Continue follow-up with nephrology.  Follow-up: Return to clinic in 6 months.  Yvonne Kendall, MD 06/28/2019 9:27 AM

## 2019-06-29 ENCOUNTER — Other Ambulatory Visit: Payer: Self-pay | Admitting: Internal Medicine

## 2019-07-06 ENCOUNTER — Other Ambulatory Visit: Payer: Self-pay | Admitting: Pharmacy Technician

## 2019-07-06 NOTE — Patient Outreach (Signed)
[facility])  07/06/2019  Drew [name] 21-Aug-1940 [id number]   Received patient portion(s) of patient assistance application(s) for Victoza. Faxed completed application and required documents into [employer].  Will follow up with company(ies) in 10-14 business days to check status of application(s).  Suzan Slick [name], CPhT Certified Pharmacy Technician [facility]

## 2019-07-11 ENCOUNTER — Ambulatory Visit: Payer: Medicare Other | Admitting: Pharmacist

## 2019-07-11 ENCOUNTER — Other Ambulatory Visit: Payer: Self-pay

## 2019-07-11 DIAGNOSIS — E782 Mixed hyperlipidemia: Secondary | ICD-10-CM

## 2019-07-11 DIAGNOSIS — I10 Essential (primary) hypertension: Secondary | ICD-10-CM

## 2019-07-11 NOTE — Progress Notes (Signed)
Chronic Care Management Pharmacy  Name: Drew [name]  MRN: [id number] DOB: [birth year]  Chief Complaint/ HPI  Drew [name],  79 y.o. , male presents for their Initial CCM visit with the clinical pharmacist via telephone due to COVID-19 Pandemic.  PCP : Drew Hartshorn, FNP  Their chronic conditions include: HF, CKD 3  Office Visits: NA  Consult Visit: 5/6 HF, End, BP 144/84 P 66 Wt 234 BMI 35.6, HFpEF, cold with Victoza, wt inc 10#  3/12 hypothyroid, Drew [name], BP 116/80 P 71 Wt 228 BMI 34.7, nail fungus, PAP?, 11/20 ankle fx   Medications: Outpatient Encounter Medications as of 07/11/2019  Medication Sig  . allopurinol (ZYLOPRIM) 100 MG tablet Take 100 mg by mouth 2 (two) times daily.  Marland Kitchen amLODipine (NORVASC) 5 MG tablet TAKE 1 TABLET BY MOUTH  DAILY  . aspirin EC 81 MG tablet Take 81 mg by mouth daily.  . carvedilol (COREG) 3.125 MG tablet TAKE 1 TABLET BY MOUTH TWO  TIMES DAILY  . Coenzyme Q10 (CO Q 10) 100 MG CAPS Take 100 mg by mouth daily.  . famotidine (PEPCID) 20 MG tablet TAKE 1 TABLET BY MOUTH  DAILY  . levothyroxine (SYNTHROID) 25 MCG tablet TAKE 1 TABLET BY MOUTH IN  THE MORNING  . liraglutide (VICTOZA) 18 MG/3ML SOPN Inject 0.2 mLs (1.2 mg total) into the skin daily.  Marland Kitchen losartan (COZAAR) 25 MG tablet Take 1 tablet (25 mg total) by mouth daily.  . Omega-3 Fatty Acids (FISH OIL) 1000 MG CAPS Take 1,000 mg by mouth daily.  . rosuvastatin (CRESTOR) 5 MG tablet TAKE 1 TABLET BY MOUTH  EVERY OTHER DAY  . hydrochlorothiazide (MICROZIDE) 12.5 MG capsule Take 1 capsule (12.5 mg total) by mouth 3 (three) times a week. (Patient not taking: Reported on 07/11/2019)  . triamcinolone cream (KENALOG) 0.1 % Apply 1 application topically 2 (two) times daily as needed. (Patient not taking: Reported on 07/11/2019)   No facility-administered encounter medications on file as of 07/11/2019.     Current Diagnosis/Assessment:  Goals Addressed            This Visit's Progress   . Chronic  Care Management       CARE PLAN ENTRY  Current Barriers:  . Chronic Disease Management support, education, and care coordination needs related to Hypertension, Hyperlipidemia, Diabetes, Atrial Fibrillation, and Gastroesophageal Reflux Disease   Hypertension . Pharmacist Clinical Goal(s): o Over the next 90 days, patient will work with PharmD and providers to maintain BP goal <130/80 . Current regimen:  o amlodipine 5mg  daily, carvedilol 3.125mg  twice daily, HCTZ 12.mg daily, losartan 25mg  daily . Interventions: o Counseled on importance of taking hydrochlorothiazide daily  . Patient self care activities - Over the next 90 days, patient will: o Check BP daily, document, and provide at future appointments o Take hydrochlorothiazide daily by timing with outdoor activities o Ensure daily salt intake < 2300 mg/day  Hyperlipidemia . Pharmacist Clinical Goal(s): o Over the next 90 days, patient will work with PharmD and providers to achieve LDL goal < 70 . Current regimen:  o Crestor 5mg  Monday, Wednesday, Friday, Fish oil 1 cap twice daily, CoQ10 100mg  daily and horse liniment for muscle pain . Interventions: o Counseled that Crestor MWF is less than every other day o Take CoQ10 100mg  twice daily o Counseled that tryglycerides are still high. Increase fish oil 2 caps twice daily . Patient self care activities - Over the next 90 days, patient will: o Double  fish oil and CoQ10 consumption o Take Crestor every other day as label indicates  Diabetes . Pharmacist Clinical Goal(s): o Over the next 90 days, patient will work with PharmD and providers to maintain A1c goal <7% . Current regimen:  o Victoza 1.2mg  injected daily, but held for low blood sugar . Interventions: o Decrease Victoza to 0.6mg  daily o Counseled on Victoza mechanism and not to hold doses . Patient self care activities - Over the next 90 days, patient will: o Check blood sugar once daily, document, and provide at future  appointments o Contact provider with any episodes of hypoglycemia  Gout . Pharmacist Clinical Goal(s) o Over the next 90 days, patient will work with PharmD and providers to reduce gout flares . Current regimen:  o Allopurinol 100mg  twice daily . Interventions: o Counseled on normal uric acid level x 2 years o Counseled to avoid red tomatoes and continue to explore other types . Patient self care activities - Over the next 90 days, patient will: o Take allopurinol twice daily o Avoid eating red tomatoes  Medication management . Pharmacist Clinical Goal(s): o Over the next 90 days, patient will work with PharmD and providers to achieve optimal medication adherence . Current pharmacy: [facility] . Interventions o Comprehensive medication review performed. o Corrections to medication list o Continue current medication management strategy . Patient self care activities - Over the next 90 days, patient will: o Focus on medication adherence by taking medications as prescribed o Report any questions or concerns to PharmD and/or provider(s)  Initial goal documentation       Hypertension   Office blood pressures are  BP Readings from Last 3 Encounters:  06/28/19 (!) 144/84  05/04/19 116/80  03/01/19 112/68   Patient has failed these meds in the past: amlodipine 10mg  Current: amlodipine 5mg , carvedilol 3.125mg  bid, HCTZ 12.mg daily, losartan 25mg  daily  Patient checks BP at home not since April  Patient home BP readings are ranging: NA  We discussed:  Had dizziness until change to losartan 25mg  Not taking HCTZ due to being out in the yard  Plan  Continue current medications   Diabetes   Recent Relevant Labs: Lab Results  Component Value Date/Time   HGBA1C 6.2 (H) 05/04/2019 10:02 AM   HGBA1C 5.9 (H) 08/02/2018 11:14 AM   MICROALBUR 2.1 08/02/2018 11:14 AM   MICROALBUR 3.3 05/31/2017 09:10 AM    Checking BG: Daily   Feels a low like 115, gets cold  Recent FBG  Readings: 115 - 173 Patient has failed these meds in past: metformin Patient is currently controlled on the following medications: Victoza, occasionally held for low BG  Last diabetic Foot exam:  Lab Results  Component Value Date/Time   HMDIABEYEEXA No Retinopathy 06/25/2019 12:00 AM    Last diabetic Eye exam: No results found for: HMDIABFOOTEX   We discussed:  He gets cold after he injects Victoza each day. If he checks BG when cold, it's approximately 115 which he feels is too low. Skips doses of Victoza based on BG, yet well controlled  Victoza arrived during patient visit. Annoyed by $150 cost for 3 months, but said it was affordable.  Plan  Counseled that Victoza is not insulin and is unlikely to drop a BG 115 to dangerous levels. Works with the body.  Decrease Victoza 0.6mg  0.1ml inj once daily  AFIB   Patient is currently rate controlled. HR 66 BPM  Patient has failed these meds in past: NA Patient is currently  controlled on the following medications: Coreg  We discussed:   Doesn't feel palpitations currently  Plan  Continue current medications  Hyperlipidemia   Lipid Panel     Component Value Date/Time   CHOL 146 05/04/2019 1002   TRIG 221 (H) 05/04/2019 1002   HDL 30 (L) 05/04/2019 1002   CHOLHDL 4.9 05/04/2019 1002   VLDL 29 04/21/2016 0848   LDLCALC 85 05/04/2019 1002     The ASCVD Risk score (Goff DC Jr., et al., 2013) failed to calculate for the following reasons:   The 2013 ASCVD risk score is only valid for ages 29 to 27   Patient has failed these meds in past: niacin, Livalo Patient is currently uncontrolled on the following medications: Crestor, CoQ10, Fish oil, topical horse linament  We discussed:   Takes Crestor MWF, never taken daily but reports myalgias, uses horse linament (effective) Takes fish oil 1g twice daily, TG not controlled  Plan  Patient to increase CoQ10 100mg  twice daily Consider Crestor daily Increase fish oil 2 caps  twice daily  Gout   Patient has failed these meds in past: NA Patient is currently controlled on the following medications: allopurinol  We discussed:   Allopurinol twice daily because flare ups every 2 months Uric acid = 6.6, normal x 2 years at least. Triggered by red tomatoes, has them once monthly, now trying other colors with success  Plan  Avoid red tomatoes Continue current medications   GERD   Patient has failed these meds in past: Protonix Patient is currently controlled on the following medications: Pepcid  We discussed:   Had a gastric ulcer with enough internal bleed to require infusion Could not tolerate Protonix Pepcid once daily  Plan Counseled that he can take famotidine twice daily Continue current medications   Medication Management   Pt uses [facility] pharmacy for all medications Uses pill box? Yes Pt endorses 100% compliance disadvantaged  We discussed:  Had dizziness until changed to losartan  Vitamin D3 daily Low platelets  Plan  Continue current medication management strategy  Follow up: 3 month phone visit  , PharmD, BCGP, CTTS Clinical Pharmacist [facility] [phone]

## 2019-07-14 NOTE — Patient Instructions (Addendum)
Visit Information  Goals Addressed            This Visit's Progress   . Chronic Care Management       CARE PLAN ENTRY  Current Barriers:  . Chronic Disease Management support, education, and care coordination needs related to Hypertension, Hyperlipidemia, Diabetes, Atrial Fibrillation, and Gastroesophageal Reflux Disease   Hypertension . Pharmacist Clinical Goal(s): o Over the next 90 days, patient will work with PharmD and providers to maintain BP goal <130/80 . Current regimen:  o amlodipine 5mg  daily, carvedilol 3.125mg  twice daily, HCTZ 12.mg daily, losartan 25mg  daily . Interventions: o Counseled on importance of taking hydrochlorothiazide daily  . Patient self care activities - Over the next 90 days, patient will: o Check BP daily, document, and provide at future appointments o Take hydrochlorothiazide daily by timing with outdoor activities o Ensure daily salt intake < 2300 mg/day  Hyperlipidemia . Pharmacist Clinical Goal(s): o Over the next 90 days, patient will work with PharmD and providers to achieve LDL goal < 70 . Current regimen:  o Crestor 5mg  Monday, Wednesday, Friday, Fish oil 1 cap twice daily, CoQ10 100mg  daily and horse liniment for muscle pain . Interventions: o Counseled that Crestor MWF is less than every other day o Take CoQ10 100mg  twice daily o Counseled that tryglycerides are still high. Increase fish oil 2 caps twice daily . Patient self care activities - Over the next 90 days, patient will: o Double fish oil and CoQ10 consumption o Take Crestor every other day as label indicates  Diabetes . Pharmacist Clinical Goal(s): o Over the next 90 days, patient will work with PharmD and providers to maintain A1c goal <7% . Current regimen:  o Victoza 1.2mg  injected daily, but held for low blood sugar . Interventions: o Decrease Victoza to 0.6mg  daily o Counseled on Victoza mechanism and not to hold doses . Patient self care activities - Over the next  90 days, patient will: o Check blood sugar once daily, document, and provide at future appointments o Contact provider with any episodes of hypoglycemia  Gout . Pharmacist Clinical Goal(s) o Over the next 90 days, patient will work with PharmD and providers to reduce gout flares . Current regimen:  o Allopurinol 100mg  twice daily . Interventions: o Counseled on normal uric acid level x 2 years o Counseled to avoid red tomatoes and continue to explore other types . Patient self care activities - Over the next 90 days, patient will: o Take allopurinol twice daily o Avoid eating red tomatoes  Medication management . Pharmacist Clinical Goal(s): o Over the next 90 days, patient will work with PharmD and providers to achieve optimal medication adherence . Current pharmacy: [facility] . Interventions o Comprehensive medication review performed. o Corrections to medication list o Continue current medication management strategy . Patient self care activities - Over the next 90 days, patient will: o Focus on medication adherence by taking medications as prescribed o Report any questions or concerns to PharmD and/or provider(s)  Initial goal documentation        Mr. Boardley was given information about Chronic Care Management services today including:  1. CCM service includes personalized support from designated clinical staff supervised by his physician, including individualized plan of care and coordination with other care providers 2. 24/7 contact phone numbers for assistance for urgent and routine care needs. 3. Standard insurance, coinsurance, copays and deductibles apply for chronic care management only during months in which we provide at least 20 minutes  of these services. Most insurances cover these services at 100%, however patients may be responsible for any copay, coinsurance and/or deductible if applicable. This service may help you avoid the need for more expensive face-to-face  services. 4. Only one practitioner may furnish and bill the service in a calendar month. 5. The patient may stop CCM services at any time (effective at the end of the month) by phone call to the office staff.  Patient agreed to services and verbal consent obtained.   Print copy of patient instructions provided.  Telephone follow up appointment with pharmacy team member scheduled for: 3 months  Milus Height, PharmD, [place], [facility] [phone]   Gout  Gout is painful swelling of your joints. Gout is a type of arthritis. It is caused by having too much uric acid in your body. Uric acid is a chemical that is made when your body breaks down substances called purines. If your body has too much uric acid, sharp crystals can form and build up in your joints. This causes pain and swelling. Gout attacks can happen quickly and be very painful (acute gout). Over time, the attacks can affect more joints and happen more often (chronic gout). What are the causes?  Too much uric acid in your blood. This can happen because: ? Your kidneys do not remove enough uric acid from your blood. ? Your body makes too much uric acid. ? You eat too many foods that are high in purines. These foods include organ meats, some seafood, and beer.  Trauma or stress. What increases the risk?  Having a family history of gout.  Being male and middle-aged.  Being male and having gone through menopause.  Being very overweight (obese).  Drinking alcohol, especially beer.  Not having enough water in the body (being dehydrated).  Losing weight too quickly.  Having an organ transplant.  Having lead poisoning.  Taking certain medicines.  Having kidney disease.  Having a skin condition called psoriasis. What are the signs or symptoms? An attack of acute gout usually happens in just one joint. The most common place is the big toe. Attacks often start at night. Other  joints that may be affected include joints of the feet, ankle, knee, fingers, wrist, or elbow. Symptoms of an attack may include:  Very bad pain.  Warmth.  Swelling.  Stiffness.  Shiny, red, or purple skin.  Tenderness. The affected joint may be very painful to touch.  Chills and fever. Chronic gout may cause symptoms more often. More joints may be involved. You may also have white or yellow lumps (tophi) on your hands or feet or in other areas near your joints. How is this treated?  Treatment for this condition has two phases: treating an acute attack and preventing future attacks.  Acute gout treatment may include: ? NSAIDs. ? Steroids. These are taken by mouth or injected into a joint. ? Colchicine. This medicine relieves pain and swelling. It can be given by mouth or through an IV tube.  Preventive treatment may include: ? Taking small doses of NSAIDs or colchicine daily. ? Using a medicine that reduces uric acid levels in your blood. ? Making changes to your diet. You may need to see a food expert (dietitian) about what to eat and drink to prevent gout. Follow these instructions at home: During a gout attack   If told, put ice on the painful area: ? Put ice in a plastic bag. ? Place a towel  between your skin and the bag. ? Leave the ice on for 20 minutes, 2-3 times a day.  Raise (elevate) the painful joint above the level of your heart as often as you can.  Rest the joint as much as possible. If the joint is in your leg, you may be given crutches.  Follow instructions from your doctor about what you cannot eat or drink. Avoiding future gout attacks  Eat a low-purine diet. Avoid foods and drinks such as: ? Liver. ? Kidney. ? Anchovies. ? Asparagus. ? Herring. ? Mushrooms. ? Mussels. ? Beer.  Stay at a healthy weight. If you want to lose weight, talk with your doctor. Do not lose weight too fast.  Start or continue an exercise plan as told by your  doctor. Eating and drinking  Drink enough fluids to keep your pee (urine) pale yellow.  If you drink alcohol: ? Limit how much you use to:  0-1 drink a day for women.  0-2 drinks a day for men. ? Be aware of how much alcohol is in your drink. In the [place] one drink equals one 12 oz bottle of beer (355 mL), one 5 oz glass of wine (148 mL), or one 1 oz glass of hard liquor (44 mL). General instructions  Take over-the-counter and prescription medicines only as told by your doctor.  Do not drive or use heavy machinery while taking prescription pain medicine.  Return to your normal activities as told by your doctor. Ask your doctor what activities are safe for you.  Keep all follow-up visits as told by your doctor. This is important. Contact a doctor if:  You have another gout attack.  You still have symptoms of a gout attack after 10 days of treatment.  You have problems (side effects) because of your medicines.  You have chills or a fever.  You have burning pain when you pee (urinate).  You have pain in your lower back or belly. Get help right away if:  You have very bad pain.  Your pain cannot be controlled.  You cannot pee. Summary  Gout is painful swelling of the joints.  The most common site of pain is the big toe, but it can affect other joints.  Medicines and avoiding some foods can help to prevent and treat gout attacks. This information is not intended to replace advice given to you by your health care provider. Make sure you discuss any questions you have with your health care provider. Document Revised: 08/31/2017 Document Reviewed: 08/31/2017 Elsevier Patient Education  2020 [employer].

## 2019-08-09 ENCOUNTER — Other Ambulatory Visit: Payer: Self-pay | Admitting: Internal Medicine

## 2019-08-13 ENCOUNTER — Other Ambulatory Visit: Payer: Self-pay

## 2019-08-13 ENCOUNTER — Encounter: Payer: Self-pay | Admitting: Family Medicine

## 2019-08-13 ENCOUNTER — Ambulatory Visit (INDEPENDENT_AMBULATORY_CARE_PROVIDER_SITE_OTHER): Payer: Medicare Other | Admitting: Family Medicine

## 2019-08-13 VITALS — BP 122/82 | HR 75 | Temp 98.3°F | Resp 18 | Ht 68.0 in | Wt 230.7 lb

## 2019-08-13 DIAGNOSIS — E1169 Type 2 diabetes mellitus with other specified complication: Secondary | ICD-10-CM | POA: Diagnosis not present

## 2019-08-13 DIAGNOSIS — N1831 Chronic kidney disease, stage 3a: Secondary | ICD-10-CM | POA: Diagnosis not present

## 2019-08-13 DIAGNOSIS — G4733 Obstructive sleep apnea (adult) (pediatric): Secondary | ICD-10-CM

## 2019-08-13 DIAGNOSIS — E785 Hyperlipidemia, unspecified: Secondary | ICD-10-CM

## 2019-08-13 DIAGNOSIS — N183 Chronic kidney disease, stage 3 unspecified: Secondary | ICD-10-CM

## 2019-08-13 DIAGNOSIS — E669 Obesity, unspecified: Secondary | ICD-10-CM

## 2019-08-13 DIAGNOSIS — I129 Hypertensive chronic kidney disease with stage 1 through stage 4 chronic kidney disease, or unspecified chronic kidney disease: Secondary | ICD-10-CM | POA: Diagnosis not present

## 2019-08-13 DIAGNOSIS — E034 Atrophy of thyroid (acquired): Secondary | ICD-10-CM | POA: Diagnosis not present

## 2019-08-13 DIAGNOSIS — E119 Type 2 diabetes mellitus without complications: Secondary | ICD-10-CM | POA: Insufficient documentation

## 2019-08-13 DIAGNOSIS — M109 Gout, unspecified: Secondary | ICD-10-CM

## 2019-08-13 DIAGNOSIS — D696 Thrombocytopenia, unspecified: Secondary | ICD-10-CM | POA: Insufficient documentation

## 2019-08-13 DIAGNOSIS — Z9989 Dependence on other enabling machines and devices: Secondary | ICD-10-CM

## 2019-08-13 DIAGNOSIS — I208 Other forms of angina pectoris: Secondary | ICD-10-CM

## 2019-08-13 DIAGNOSIS — I5032 Chronic diastolic (congestive) heart failure: Secondary | ICD-10-CM

## 2019-08-13 NOTE — Progress Notes (Signed)
Name: Drew [name]   MRN: [id number]    DOB: 1940-09-13   Date:08/13/2019       Progress Note  Subjective  Chief Complaint  Chief Complaint  Patient presents with   Follow-up   Medication Management    HPI  DMII: he has obesity, dyslipidemia, HTN and CKI. His weight is going down, but BMI still above 35. FSBS at home has been 130-140. He states when glucose below 120 he does not use his Victoza, his current dose is 1.2. Explained that Victoza does not affects his fasting level He is on ARB, up to date with eye exam.   Thrombocytopenia: on last labs, we will recheck CBC today. Denies easy bruising or bleeding at this time.   OSA: he is complaint with CPAP, states is leaks, advised to try a different mask   HTN: bp is at goal, he has intermittent chest pain, on ace and beta blocker and norvasc. No side effects of medications.   Dyslipidemia: discussed adding fish to diet, continue tree nuts and recheck labs  CHF :under the care of Dr. Okey Dupre, no orthopnea, wheezing or decrease in exercise tolerance , he is on beta-blocker and ARB, takes aspirin daily .   Stable Angina: he has intermittent chest pain, under the care of cardiologist, he does not have NTG at home. Stable.   Morbid obesity: BMI above 35 with co-morbidities. He is on victoza and losing weight. DM, HTN, dyslipidemia  Controlled gout: on allopurinol , no recent episodes, uric acid above 6, but we will not adjust dose since no problems.   URI: he had rhinorrhea about 10 days ago, associated with some cough. He states cough still present but not as frequent of intense. No fever, chills or change in appetite CKI stage III: on ARB, sees Dr. Thedore Mins, no pruritus, good urine   Hypothyroidism: on the same dose of levothyroxine and last TSH at goal, weight is stable, denies dysphagia. .    Patient Active Problem List   Diagnosis Date Noted   Neuropathy involving both lower extremities 05/04/2019   Multiple renal cysts  04/09/2019   Type II or unspecified type diabetes mellitus with renal manifestations, not stated as uncontrolled 04/09/2019   Stable angina (HCC) 12/29/2018   Chest pain 05/29/2018   Onychomycosis of multiple toenails with type 2 diabetes mellitus (HCC) 11/30/2017   Chronic heart failure with preserved ejection fraction (HFpEF) (HCC) 10/20/2017   Diastolic dysfunction 02/10/2017   Obstructive sleep apnea 02/10/2017   Dyspnea on exertion 10/20/2016   Chronic fatigue 10/20/2016   Pain in both lower extremities 10/20/2016   Arm pain, medial, right 08/27/2016   Encounter for cardiac risk counseling 02/10/2016   Light-headedness 11/26/2015   Abnormal ankle brachial index (ABI) 11/26/2015   Stage 3a chronic kidney disease 08/15/2015   H/O: upper GI bleed    Medication monitoring encounter 08/07/2015   Morbid obesity (HCC) 08/07/2015   Hypothyroidism due to acquired atrophy of thyroid 03/11/2015   History of prostatitis 11/06/2014   Essential hypertension 11/06/2014   Mixed hyperlipidemia 11/06/2014   Idiopathic chronic gout of right knee without tophus 11/06/2014   Flat feet, bilateral 11/06/2014   History of anemia 11/06/2014   Chronic multiple gastric erosions 11/06/2014   Scrotal swelling 11/06/2014   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 11/06/2014   Chronic gout of ankle 08/16/2014   H/O: GI bleed 12/20/2013    Past Surgical History:  Procedure Laterality Date   CATARACT EXTRACTION,  BILATERAL Bilateral 11/18/2017   COLONOSCOPY N/A 12/27/2013   Procedure: COLONOSCOPY;  Surgeon: Barrie Folk, MD;  Location: [facility] ENDOSCOPY;  Service: Endoscopy;  Laterality: N/A;   ENTEROSCOPY N/A 12/21/2013   Procedure: ENTEROSCOPY;  Surgeon: Vertell Novak., MD;  Location: [facility] ENDOSCOPY;  Service: Endoscopy;  Laterality: N/A;   ENTEROSCOPY N/A 12/27/2013   Procedure: ENTEROSCOPY;  Surgeon: Barrie Folk, MD;  Location: [facility] ENDOSCOPY;   Service: Endoscopy;  Laterality: N/A;    Family History  Problem Relation Age of Onset   Hyperlipidemia Father    Kidney disease Father    Heart disease Father    Congestive Heart Failure Mother    Heart disease Mother    Hyperlipidemia Daughter    Hyperlipidemia Son    Diabetes Son    Anxiety disorder Daughter    Depression Daughter    Arthritis Daughter     Social History   Tobacco Use   Smoking status: Former Smoker    Types: Cigarettes, Cigars    Quit date: 1990    Years since quitting: 31.4   Smokeless tobacco: Never Used   Tobacco comment: Smoked afternoon cigar  Substance Use Topics   Alcohol use: No     Current Outpatient Medications:    acetaminophen (TYLENOL) 500 MG tablet, Take 500 mg by mouth every 6 (six) hours as needed., Disp: , Rfl:    allopurinol (ZYLOPRIM) 100 MG tablet, Take 100 mg by mouth 2 (two) times daily., Disp: , Rfl:    amLODipine (NORVASC) 5 MG tablet, TAKE 1 TABLET BY MOUTH  DAILY, Disp: 90 tablet, Rfl: 2   aspirin EC 81 MG tablet, Take 81 mg by mouth daily., Disp: , Rfl:    carvedilol (COREG) 3.125 MG tablet, TAKE 1 TABLET BY MOUTH TWO  TIMES DAILY, Disp: 180 tablet, Rfl: 3   Cholecalciferol (VITAMIN D3) 25 MCG (1000 UT) CAPS, Take 1,000 Units by mouth daily., Disp: , Rfl:    Coenzyme Q10 (CO Q 10) 100 MG CAPS, Take 100 mg by mouth daily., Disp: , Rfl:    famotidine (PEPCID) 20 MG tablet, TAKE 1 TABLET BY MOUTH  DAILY, Disp: 90 tablet, Rfl: 3   hydrochlorothiazide (MICROZIDE) 12.5 MG capsule, Take 1 capsule (12.5 mg total) by mouth 3 (three) times a week., Disp: 36 capsule, Rfl: 3   levothyroxine (SYNTHROID) 25 MCG tablet, TAKE 1 TABLET BY MOUTH IN  THE MORNING, Disp: 90 tablet, Rfl: 3   liraglutide (VICTOZA) 18 MG/3ML SOPN, Inject 0.2 mLs (1.2 mg total) into the skin daily., Disp: 6 mL, Rfl: 3   losartan (COZAAR) 25 MG tablet, Take 1 tablet (25 mg total) by mouth daily., Disp: 90 tablet, Rfl: 0   Multiple  Vitamins-Minerals (CENTRUM SILVER PO), Take 200 mg by mouth daily., Disp: , Rfl:    Omega-3 Fatty Acids (FISH OIL) 1000 MG CAPS, Take 1,000 mg by mouth daily., Disp: , Rfl:    rosuvastatin (CRESTOR) 5 MG tablet, TAKE 1 TABLET BY MOUTH  EVERY OTHER DAY, Disp: 45 tablet, Rfl: 3   Turmeric (QC TUMERIC COMPLEX) 500 MG CAPS, Take 500 mg by mouth daily., Disp: , Rfl:   Allergies  Allergen Reactions   Niacin And Related Itching and Rash    I personally reviewed active problem list, medication list, allergies, family history, social history, health maintenance with the patient/caregiver today.   ROS  Constitutional: Negative for fever or significant weight change.  Respiratory: Positive  for cough but no shortness of breath.  Cardiovascular: positive  for intermittent  chest pain but no  palpitations.  Gastrointestinal: Negative for abdominal pain, no bowel changes.  Musculoskeletal: Negative for gait problem or joint swelling.  Skin: Negative for rash.  Neurological: Negative for dizziness or headache.  No other specific complaints in a complete review of systems (except as listed in HPI above).   Objective  Vitals:   08/13/19 1119  BP: 122/82  Pulse: 75  Resp: 18  Temp: 98.3 F (36.8 C)  TempSrc: Temporal  SpO2: 93%  Weight: 230 lb 11.2 oz (104.6 kg)  Height: 5\' 8"  (1.727 m)    Body mass index is 35.08 kg/m.  Physical Exam   Constitutional: Patient appears well-developed and well-nourished. Obese  No distress.  HEENT: head atraumatic, normocephalic, pupils equal and reactive to light,  neck supple Cardiovascular: Normal rate, regular rhythm and normal heart sounds.  No murmur heard. No BLE edema. Pulmonary/Chest: Effort normal and breath sounds normal. No respiratory distress. Abdominal: Soft.  There is no tenderness. Psychiatric: Patient has a normal mood and affect. behavior is normal. Judgment and thought content normal.  Recent Results (from the past 2160  hour(s))  HM DIABETES EYE EXAM     Status: None   Collection Time: 06/25/19 12:00 AM  Result Value Ref Range   HM Diabetic Eye Exam No Retinopathy No Retinopathy    Comment: Eye care center     PHQ2/9: Depression screen [facility] 2/9 08/13/2019 05/04/2019 03/01/2019 02/01/2019 08/02/2018  Decreased Interest 1 0 0 0 0  Down, Depressed, Hopeless 1 0 0 0 0  PHQ - 2 Score 2 0 0 0 0  Altered sleeping 1 0 - 0 0  Tired, decreased energy 1 0 - 0 0  Change in appetite 0 0 - 0 0  Feeling bad or failure about yourself  0 0 - 0 0  Trouble concentrating 1 0 - 0 0  Moving slowly or fidgety/restless 0 0 - 0 0  Suicidal thoughts 0 0 - 0 0  PHQ-9 Score 5 0 - 0 0  Difficult doing work/chores Somewhat difficult Not difficult at all - Not difficult at all -  Some recent data might be hidden    phq 9 is positive, he states upset about his lawn mower that he was trying to fix it, otherwise not upset, that is situational.    Fall Risk: Fall Risk  08/13/2019 05/04/2019 03/01/2019 02/01/2019 08/02/2018  Falls in the past year? 1 1 1 1  0  Number falls in past yr: 0 1 1 1  0  Comment - - - - -  Injury with Fall? 1 1 1 1  0  Comment Knee injury - - - -  Risk for fall due to : History of fall(s) - - - -  Follow up - - Falls prevention discussed Falls evaluation completed -    Functional Status Survey: Is the patient deaf or have difficulty hearing?: No Does the patient have difficulty seeing, even when wearing glasses/contacts?: No Does the patient have difficulty concentrating, remembering, or making decisions?: Yes Does the patient have difficulty walking or climbing stairs?: Yes Does the patient have difficulty dressing or bathing?: No Does the patient have difficulty doing errands alone such as visiting a doctor's office or shopping?: No   Assessment & Plan  1. Benign hypertension with chronic kidney disease, stage III  At goal   2. Dyslipidemia associated with type 2 diabetes mellitus ([place])  On statin  therapy   3. Diabetes mellitus  type 2 in obese [facility])  Discussed importance of not skipping vicotza when fasting glucose is at goal   4. Hypothyroidism due to acquired atrophy of thyroid  It has been at goal over the years, compliant   5. Stage 3a chronic kidney disease  Keep follow up with Dr. Thedore Mins   6. Thrombocytopenia (HCC)  Recheck labs next visit   7. Controlled gout  Last uric acid not at goal, but no symptoms, we will continue current dose   8. Chronic heart failure with preserved ejection fraction (HCC)  Stable, no orthopnea or decrease in exercise tolerance  9. Morbid obesity (HCC)  BMI above 35 with co-morbidities   10. OSA on CPAP  Needs to find a better mask   11. Stable angina (HCC)  On medication

## 2019-09-03 ENCOUNTER — Other Ambulatory Visit: Payer: Self-pay

## 2019-09-03 ENCOUNTER — Ambulatory Visit: Payer: Medicare Other | Admitting: Family Medicine

## 2019-09-03 MED ORDER — LIRAGLUTIDE 18 MG/3ML ~~LOC~~ SOPN: 1.2000 mg | PEN_INJECTOR | Freq: Every day | SUBCUTANEOUS | 3 refills | Status: DC

## 2019-10-05 ENCOUNTER — Other Ambulatory Visit: Payer: Self-pay

## 2019-10-05 DIAGNOSIS — E034 Atrophy of thyroid (acquired): Secondary | ICD-10-CM

## 2019-10-06 MED ORDER — LEVOTHYROXINE SODIUM 25 MCG PO TABS: 25.0000 ug | ORAL_TABLET | Freq: Every morning | ORAL | 0 refills | Status: DC

## 2019-10-10 ENCOUNTER — Other Ambulatory Visit: Payer: Self-pay

## 2019-10-10 ENCOUNTER — Ambulatory Visit (INDEPENDENT_AMBULATORY_CARE_PROVIDER_SITE_OTHER): Payer: Medicare Other | Admitting: Pharmacist

## 2019-10-10 DIAGNOSIS — E782 Mixed hyperlipidemia: Secondary | ICD-10-CM | POA: Diagnosis not present

## 2019-10-10 DIAGNOSIS — N261 Atrophy of kidney (terminal): Secondary | ICD-10-CM | POA: Insufficient documentation

## 2019-10-10 DIAGNOSIS — E669 Obesity, unspecified: Secondary | ICD-10-CM

## 2019-10-10 DIAGNOSIS — R600 Localized edema: Secondary | ICD-10-CM | POA: Insufficient documentation

## 2019-10-10 DIAGNOSIS — E1169 Type 2 diabetes mellitus with other specified complication: Secondary | ICD-10-CM | POA: Diagnosis not present

## 2019-10-10 DIAGNOSIS — R809 Proteinuria, unspecified: Secondary | ICD-10-CM | POA: Insufficient documentation

## 2019-10-10 NOTE — Chronic Care Management (AMB) (Signed)
 Chronic Care Management Pharmacy  Name: Drew [name]  MRN: [id number] DOB: 08/27/1940  Chief Complaint/ HPI  Drew [name],  79 y.o. , male presents for their Follow-Up CCM visit with the clinical pharmacist via telephone due to COVID-19 Pandemic.  PCP : Sowles, Krichna, MD  Their chronic conditions include: DM, HTN, HLD  Office Visits:NA  Consult Visit:NA  Medications: Outpatient Encounter Medications as of 10/10/2019  Medication Sig  . acetaminophen (TYLENOL) 500 MG tablet Take 500 mg by mouth every 6 (six) hours as needed.  . allopurinol (ZYLOPRIM) 100 MG tablet Take 100 mg by mouth 2 (two) times daily.  . amLODipine (NORVASC) 5 MG tablet TAKE 1 TABLET BY MOUTH  DAILY  . aspirin EC 81 MG tablet Take 81 mg by mouth daily.  . carvedilol (COREG) 3.125 MG tablet TAKE 1 TABLET BY MOUTH TWO  TIMES DAILY  . Cholecalciferol (VITAMIN D3) 25 MCG (1000 UT) CAPS Take 1,000 Units by mouth daily.  . Coenzyme Q10 (CO Q 10) 100 MG CAPS Take 100 mg by mouth daily.  . famotidine (PEPCID) 20 MG tablet TAKE 1 TABLET BY MOUTH  DAILY  . hydrochlorothiazide (MICROZIDE) 12.5 MG capsule Take 1 capsule (12.5 mg total) by mouth 3 (three) times a week.  . levothyroxine (SYNTHROID) 25 MCG tablet Take 1 tablet (25 mcg total) by mouth every morning.  . liraglutide (VICTOZA) 18 MG/3ML SOPN Inject 0.2 mLs (1.2 mg total) into the skin daily.  . losartan (COZAAR) 25 MG tablet Take 1 tablet (25 mg total) by mouth daily.  . Multiple Vitamins-Minerals (CENTRUM SILVER PO) Take 200 mg by mouth daily.  . Omega-3 Fatty Acids (FISH OIL) 1000 MG CAPS Take 1,000 mg by mouth daily.  . rosuvastatin (CRESTOR) 5 MG tablet TAKE 1 TABLET BY MOUTH  EVERY OTHER DAY  . Turmeric (QC TUMERIC COMPLEX) 500 MG CAPS Take 500 mg by mouth daily.   No facility-administered encounter medications on file as of 10/10/2019.      Financial Resource Strain: Medium Risk  . Difficulty of Paying Living Expenses: Somewhat hard   Current  Diagnosis/Assessment:  Goals Addressed            This Visit's Progress   . Chronic Care Management       CARE PLAN ENTRY  Current Barriers:  . Chronic Disease Management support, education, and care coordination needs related to Hypertension, Hyperlipidemia, Diabetes, Atrial Fibrillation, and Gastroesophageal Reflux Disease   Hypertension . Pharmacist Clinical Goal(s): o Over the next 90 days, patient will work with PharmD and providers to maintain BP goal <130/80 . Current regimen:  o amlodipine 5mg daily, carvedilol 3.125mg twice daily, HCTZ 12.mg daily, losartan 25mg daily . Interventions: o Counseled on importance of taking hydrochlorothiazide daily  . Patient self care activities - Over the next 90 days, patient will: o Check BP daily, document, and provide at future appointments o Take hydrochlorothiazide daily by timing with outdoor activities o Ensure daily salt intake < 2300 mg/day  Hyperlipidemia . Pharmacist Clinical Goal(s): o Over the next 90 days, patient will work with PharmD and providers to achieve LDL goal < 70 . Current regimen:  o Crestor 5mg Monday, Wednesday, Friday, Fish oil 1 cap twice daily, CoQ10 100mg daily and horse liniment for muscle pain . Interventions: o Counseled that Crestor MWF is less than every other day o Wife refused CoQ10 twice daily o Counseled that tryglycerides are still high. Increase fish oil 2 caps twice daily . Patient self   care activities - Over the next 90 days, patient will: o Wife refused double fish oil and CoQ10 consumption o Take Crestor every other day as label indicates  Diabetes . Pharmacist Clinical Goal(s): o Over the next 90 days, patient will work with PharmD and providers to maintain A1c goal <7% . Current regimen:  o Victoza 1.2mg injected daily, but held for low blood sugar . Interventions: o Decrease Victoza to 0.6mg daily o Counseled on Victoza mechanism and not to hold doses . Patient self care  activities - Over the next 90 days, patient will: o Check blood sugar once daily, document, and provide at future appointments o Contact provider with any episodes of hypoglycemia  Gout . Pharmacist Clinical Goal(s) o Over the next 90 days, patient will work with PharmD and providers to reduce gout flares . Current regimen:  o Allopurinol 100mg twice daily . Interventions: o Counseled on normal uric acid level x 2 years o Counseled to avoid red tomatoes and continue to explore other types . Patient self care activities - Over the next 90 days, patient will: o Take allopurinol twice daily o Avoid eating red tomatoes  Medication management . Pharmacist Clinical Goal(s): o Over the next 90 days, patient will work with PharmD and providers to achieve optimal medication adherence . Current pharmacy: [facility] . Interventions o Comprehensive medication review performed. o Corrections to medication list o Continue current medication management strategy . Patient self care activities - Over the next 90 days, patient will: o Focus on medication adherence by taking medications as prescribed o Report any questions or concerns to PharmD and/or provider(s)  Initial goal documentation        Diabetes   Recent Relevant Labs: Lab Results  Component Value Date/Time   HGBA1C 6.2 (H) 05/04/2019 10:02 AM   HGBA1C 5.9 (H) 08/02/2018 11:14 AM   MICROALBUR 2.1 08/02/2018 11:14 AM   MICROALBUR 3.3 05/31/2017 09:10 AM    Patient is currently controlled on the following medications: Victoza  Last diabetic Foot exam:  Lab Results  Component Value Date/Time   HMDIABEYEEXA No Retinopathy 06/25/2019 12:00 AM    Last diabetic Eye exam: No results found for: HMDIABFOOTEX   We discussed:  Decreased Victoza? No, sx got better Avoiding red tomatoes for gout? Yes, no gout Pepcid twice daily?   Sugar has improved, down to 110 Now taking Victoza daily  Plan  Continue current  medications  Hyperlipidemia   LDL goal < 70  Lipid Panel     Component Value Date/Time   CHOL 146 05/04/2019 1002   TRIG 221 (H) 05/04/2019 1002   HDL 30 (L) 05/04/2019 1002   LDLCALC 85 05/04/2019 1002    Hepatic Function Latest Ref Rng & Units 05/04/2019 08/02/2018 05/31/2017  Total Protein 6.1 - 8.1 g/dL 6.5 6.6 6.7  Albumin 3.6 - 5.1 g/dL - - -  AST 10 - 35 U/L 18 21 27  ALT 9 - 46 U/L 17 23 33  Alk Phosphatase 40 - 115 U/L - - -  Total Bilirubin 0.2 - 1.2 mg/dL 0.8 1.0 0.8     The ASCVD Risk score (Goff DC Jr., et al., 2013) failed to calculate for the following reasons:   The 2013 ASCVD risk score is only valid for ages 40 to 79   Patient has failed these meds in past: NA Patient is currently uncontrolled on the following medications:  . Crestor 5mg three times weekly  We discussed:   Increased CoQ10 100mg bid?   No, wife didn't want to Increased fish oil 2 caps twice daily? No decreased to 1 cap daily  Plan  Continue current medications   Medication Management   Uses pill box? Yes Pt endorses 100% compliance  We discussed:  Needs to change CPAP air filter every 2 weeks Can't take melatonin due to day time somnolence  Plan  Continue current medication management strategy  Follow up: 3 month phone visit  Milus Height, PharmD, [place], [facility] [phone]

## 2019-10-15 NOTE — Patient Instructions (Addendum)
Visit Information  Goals Addressed            This Visit's Progress   . Chronic Care Management       CARE PLAN ENTRY  Current Barriers:  . Chronic Disease Management support, education, and care coordination needs related to Hypertension, Hyperlipidemia, Diabetes, Atrial Fibrillation, and Gastroesophageal Reflux Disease   Hypertension . Pharmacist Clinical Goal(s): o Over the next 90 days, patient will work with PharmD and providers to maintain BP goal <130/80 . Current regimen:  o amlodipine 5mg  daily, carvedilol 3.125mg  twice daily, HCTZ 12.mg daily, losartan 25mg  daily . Interventions: o Counseled on importance of taking hydrochlorothiazide daily  . Patient self care activities - Over the next 90 days, patient will: o Check BP daily, document, and provide at future appointments o Take hydrochlorothiazide daily by timing with outdoor activities o Ensure daily salt intake < 2300 mg/day  Hyperlipidemia . Pharmacist Clinical Goal(s): o Over the next 90 days, patient will work with PharmD and providers to achieve LDL goal < 70 . Current regimen:  o Crestor 5mg  Monday, Wednesday, Friday, Fish oil 1 cap twice daily, CoQ10 100mg  daily and horse liniment for muscle pain . Interventions: o Counseled that Crestor MWF is less than every other day o Wife refused CoQ10 twice daily o Counseled that tryglycerides are still high. Increase fish oil 2 caps twice daily . Patient self care activities - Over the next 90 days, patient will: o Wife refused double fish oil and CoQ10 consumption o Take Crestor every other day as label indicates  Diabetes . Pharmacist Clinical Goal(s): o Over the next 90 days, patient will work with PharmD and providers to maintain A1c goal <7% . Current regimen:  o Victoza 1.2mg  injected daily, but held for low blood sugar . Interventions: o Decrease Victoza to 0.6mg  daily o Counseled on Victoza mechanism and not to hold doses . Patient self care activities  - Over the next 90 days, patient will: o Check blood sugar once daily, document, and provide at future appointments o Contact provider with any episodes of hypoglycemia  Gout . Pharmacist Clinical Goal(s) o Over the next 90 days, patient will work with PharmD and providers to reduce gout flares . Current regimen:  o Allopurinol 100mg  twice daily . Interventions: o Counseled on normal uric acid level x 2 years o Counseled to avoid red tomatoes and continue to explore other types . Patient self care activities - Over the next 90 days, patient will: o Take allopurinol twice daily o Avoid eating red tomatoes  Medication management . Pharmacist Clinical Goal(s): o Over the next 90 days, patient will work with PharmD and providers to achieve optimal medication adherence . Current pharmacy: [facility] . Interventions o Comprehensive medication review performed. o Corrections to medication list o Continue current medication management strategy . Patient self care activities - Over the next 90 days, patient will: o Focus on medication adherence by taking medications as prescribed o Report any questions or concerns to PharmD and/or provider(s)  Initial goal documentation        Print copy of patient instructions provided.   Telephone follow up appointment with pharmacy team member scheduled for: 3 months  Thursday, PharmD, [place], CTTS Clinical Pharmacist [facility] [phone]  Dyslipidemia Dyslipidemia is an imbalance of waxy, fat-like substances (lipids) in the blood. The body needs lipids in small amounts. Dyslipidemia often involves a high level of cholesterol or triglycerides, which are types of lipids. Common forms of dyslipidemia  include:  High levels of LDL cholesterol. LDL is the type of cholesterol that causes fatty deposits (plaques) to build up in the blood vessels that carry blood away from your heart (arteries).  Low levels of HDL cholesterol. HDL  cholesterol is the type of cholesterol that protects against heart disease. High levels of HDL remove the LDL buildup from arteries.  High levels of triglycerides. Triglycerides are a fatty substance in the blood that is linked to a buildup of plaques in the arteries. What are the causes? Primary dyslipidemia is caused by changes (mutations) in genes that are passed down through families (inherited). These mutations cause several types of dyslipidemia. Secondary dyslipidemia is caused by lifestyle choices and diseases that lead to dyslipidemia, such as:  Eating a diet that is high in animal fat.  Not getting enough exercise.  Having diabetes, kidney disease, liver disease, or thyroid disease.  Drinking large amounts of alcohol.  Using certain medicines. What increases the risk? You are more likely to develop this condition if you are an older man or if you are a woman who has gone through menopause. Other risk factors include:  Having a family history of dyslipidemia.  Taking certain medicines, including birth control pills, steroids, some diuretics, and beta-blockers.  Smoking cigarettes.  Eating a high-fat diet.  Having certain medical conditions such as diabetes, polycystic ovary syndrome (PCOS), kidney disease, liver disease, or hypothyroidism.  Not exercising regularly.  Being overweight or obese with too much belly fat. What are the signs or symptoms? In most cases, dyslipidemia does not usually cause any symptoms. In severe cases, very high lipid levels can cause:  Fatty bumps under the skin (xanthomas).  White or gray ring around the black center (pupil) of the eye. Very high triglyceride levels can cause inflammation of the pancreas (pancreatitis). How is this diagnosed? Your health care provider may diagnose dyslipidemia based on a routine blood test (fasting blood test). Because most people do not have symptoms of the condition, this blood testing (lipid profile)  is done on adults age 63 and older and is repeated every 5 years. This test checks:  Total cholesterol. This measures the total amount of cholesterol in your blood, including LDL cholesterol, HDL cholesterol, and triglycerides. A healthy number is below 200.  LDL cholesterol. The target number for LDL cholesterol is different for each person, depending on individual risk factors. Ask your health care provider what your LDL cholesterol should be.  HDL cholesterol. An HDL level of 60 or higher is best because it helps to protect against heart disease. A number below 40 for men or below 50 for women increases the risk for heart disease.  Triglycerides. A healthy triglyceride number is below 150. If your lipid profile is abnormal, your health care provider may do other blood tests. How is this treated? Treatment depends on the type of dyslipidemia that you have and your other risk factors for heart disease and stroke. Your health care provider will have a target range for your lipid levels based on this information. For many people, this condition may be treated by lifestyle changes, such as diet and exercise. Your health care provider may recommend that you:  Get regular exercise.  Make changes to your diet.  Quit smoking if you smoke. If diet changes and exercise do not help you reach your goals, your health care provider may also prescribe medicine to lower lipids. The most commonly prescribed type of medicine lowers your LDL cholesterol (statin drug). If  you have a high triglyceride level, your provider may prescribe another type of drug (fibrate) or an omega-3 fish oil supplement, or both. Follow these instructions at home:  Eating and drinking  Follow instructions from your health care provider or dietitian about eating or drinking restrictions.  Eat a healthy diet as told by your health care provider. This can help you reach and maintain a healthy weight, lower your LDL cholesterol, and  raise your HDL cholesterol. This may include: ? Limiting your calories, if you are overweight. ? Eating more fruits, vegetables, whole grains, fish, and lean meats. ? Limiting saturated fat, trans fat, and cholesterol.  If you drink alcohol: ? Limit how much you use. ? Be aware of how much alcohol is in your drink. In the [place] one drink equals one 12 oz bottle of beer (355 mL), one 5 oz glass of wine (148 mL), or one 1 oz glass of hard liquor (44 mL).  Do not drink alcohol if: ? Your health care provider tells you not to drink. ? You are pregnant, may be pregnant, or are planning to become pregnant. Activity  Get regular exercise. Start an exercise and strength training program as told by your health care provider. Ask your health care provider what activities are safe for you. Your health care provider may recommend: ? 30 minutes of aerobic activity 4-6 days a week. Brisk walking is an example of aerobic activity. ? Strength training 2 days a week. General instructions  Do not use any products that contain nicotine or tobacco, such as cigarettes, e-cigarettes, and chewing tobacco. If you need help quitting, ask your health care provider.  Take over-the-counter and prescription medicines only as told by your health care provider. This includes supplements.  Keep all follow-up visits as told by your health care provider. Contact a health care provider if:  You are: ? Having trouble sticking to your exercise or diet plan. ? Struggling to quit smoking or control your use of alcohol. Summary  Dyslipidemia often involves a high level of cholesterol or triglycerides, which are types of lipids.  Treatment depends on the type of dyslipidemia that you have and your other risk factors for heart disease and stroke.  For many people, treatment starts with lifestyle changes, such as diet and exercise.  Your health care provider may prescribe medicine to lower lipids. This information is  not intended to replace advice given to you by your health care provider. Make sure you discuss any questions you have with your health care provider. Document Revised: 10/03/2017 Document Reviewed: 09/09/2017 Elsevier Patient Education  2020 [employer].

## 2019-11-05 ENCOUNTER — Telehealth: Payer: Self-pay | Admitting: Family Medicine

## 2019-11-06 ENCOUNTER — Other Ambulatory Visit: Payer: Self-pay

## 2019-11-06 MED ORDER — "NEEDLE (DISP) 32G X 5/16"" MISC": 6.0000 mm | Freq: Two times a day (BID) | 2 refills | Status: DC

## 2019-11-09 NOTE — Progress Notes (Signed)
Name: Drew [name]   MRN: [id number]    DOB: Dec 21, 1940   Date:11/13/2019       Progress Note  Subjective  Chief Complaint  Chief Complaint  Patient presents with  . Follow-up  . Diabetes  . Hypertension  . Leg Swelling    HPI   DMII: he has obesity, dyslipidemia, HTN and CKI. His weight is going down, but BMI is now below 35, A1C is stable at 6.2 % . He states now using Victoza daily and denies hypoglycemic episodes. Denies polyphagia, polyuria or polydipsia.  He is on ARB, up to date with eye exam.   Thrombocytopenia: on last labs, we will wait until next visit for labs since we will try going up on Crestor to daily   OSA: he is complaint with CPAP, he has a new mask and no longer leaking   HTN: bp is at goal, he has intermittent chest pain, on ace and beta blocker and norvasc. He denies dizziness, no orthopnea, but has intermittent chest pain.   Dyslipidemia: discussed adding fish to diet, his last LDL was not at goal, he is only taking Crestor 3 times a week, advised to try adding CoQ10 and try to go up to once daily medication, recheck labs next visit in 3 months   CHF :under the care of Dr. Okey Dupre, no orthopnea, wheezing or decrease in exercise tolerance , he is on beta-blocker and ARB, takes aspirin daily . He seems sob when talking but he states stable.   Stable Angina: he has intermittent chest pain, under the care of cardiologist, he does not have NTG at home. 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 Thedore Mins, no pruritus, good urine output, last GFR stable   Hypothyroidism: on the same dose of levothyroxine and last TSH at goal, weight is stable, denies dysphagia. Denies dry skin or change in bowel movements    Patient Active Problem List   Diagnosis Date Noted  . Atrophy of  kidney 10/10/2019  . Edema of lower extremity 10/10/2019  . Proteinuria 10/10/2019  . Thrombocytopenia (HCC) 08/13/2019  . Diabetes mellitus type 2 in obese (HCC) 08/13/2019  . Dyslipidemia associated with type 2 diabetes mellitus (HCC) 08/13/2019  . Neuropathy involving both lower extremities 05/04/2019  . Polyneuropathy, unspecified 05/04/2019  . Multiple renal cysts 04/09/2019  . Type II or unspecified type diabetes mellitus with renal manifestations, not stated as uncontrolled 04/09/2019  . Stable angina (HCC) 12/29/2018  . Chest pain 05/29/2018  . Onychomycosis of multiple toenails with type 2 diabetes mellitus (HCC) 11/30/2017  . Chronic heart failure with preserved ejection fraction (HFpEF) (HCC) 10/20/2017  . Heart failure, unspecified (HCC) 10/20/2017  . Diastolic dysfunction 02/10/2017  . Obstructive sleep apnea 02/10/2017  . Dyspnea on exertion 10/20/2016  . Chronic fatigue 10/20/2016  . Pain in both lower extremities 10/20/2016  . Arm pain, medial, right 08/27/2016  . Encounter for cardiac risk counseling 02/10/2016  . Light-headedness 11/26/2015  . Abnormal ankle brachial index (ABI) 11/26/2015  . Stage 3a chronic kidney disease 08/15/2015  . H/O: upper GI bleed   . Medication monitoring encounter 08/07/2015  . Morbid obesity (HCC) 08/07/2015  . Hypothyroidism due to acquired atrophy of thyroid 03/11/2015  . History of prostatitis 11/06/2014  .  Essential hypertension 11/06/2014  . Mixed hyperlipidemia 11/06/2014  . 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 Flat feet, bilateral 11/06/2014  . History of anemia 11/06/2014  . Chronic multiple gastric erosions 11/06/2014  . Scrotal swelling 11/06/2014  . 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 Chronic gout of ankle 08/16/2014  . H/O: GI bleed 12/20/2013  . Personal history of other diseases of the digestive system 12/20/2013    Past Surgical History:   Procedure Laterality Date  . CATARACT EXTRACTION, BILATERAL Bilateral 11/18/2017  . COLONOSCOPY N/A 12/27/2013   Procedure: COLONOSCOPY;  Surgeon: Barrie FolkJohn C Hayes, MD;  [facility];  Service: Endoscopy;  Laterality: N/A;  . ENTEROSCOPY N/A 12/21/2013   Procedure: ENTEROSCOPY;  Surgeon: Vertell NovakJames L Edwards Jr., MD;  [facility];  Service: Endoscopy;  Laterality: N/A;  . ENTEROSCOPY N/A 12/27/2013   Procedure: ENTEROSCOPY;  Surgeon: Barrie FolkJohn C Hayes, MD;  [facility];  Service: Endoscopy;  Laterality: N/A;    Family History  Problem Relation Age of Onset  . Hyperlipidemia Father   . Kidney disease Father   . Heart disease Father   . Congestive Heart Failure Mother   . Heart disease Mother   . Hyperlipidemia Daughter   . Hyperlipidemia Son   . Diabetes Son   . Anxiety disorder Daughter   . Depression Daughter   . Arthritis Daughter     Social History   Tobacco Use  . Smoking status: Former Smoker    Types: Cigarettes, Cigars    Quit date: 1990    Years since quitting: 31.7  . Smokeless tobacco: Never Used  . Tobacco comment: Smoked afternoon cigar  Substance Use Topics  . Alcohol use: No     Current Outpatient Medications:  .  acetaminophen (TYLENOL) 500 MG tablet, Take 500 mg by mouth every 6 (six) hours as needed., Disp: , Rfl:  .  allopurinol (ZYLOPRIM) 100 MG tablet, Take 100 mg by mouth 2 (two) times daily., Disp: , Rfl:  .  amLODipine (NORVASC) 5 MG tablet, TAKE 1 TABLET BY MOUTH  DAILY, Disp: 90 tablet, Rfl: 2 .  aspirin EC 81 MG tablet, Take 81 mg by mouth daily., Disp: , Rfl:  .  carvedilol (COREG) 3.125 MG tablet, TAKE 1 TABLET BY MOUTH TWO  TIMES DAILY, Disp: 180 tablet, Rfl: 3 .  Cholecalciferol (VITAMIN D3) 25 MCG (1000 UT) CAPS, Take 1,000 Units by mouth daily., Disp: , Rfl:  .  Coenzyme Q10 (CO Q 10) 100 MG CAPS, Take 100 mg by mouth daily., Disp: , Rfl:  .  famotidine (PEPCID) 20 MG tablet, TAKE 1 TABLET BY MOUTH  DAILY, Disp: 90  tablet, Rfl: 3 .  hydrochlorothiazide (MICROZIDE) 12.5 MG capsule, Take 1 capsule (12.5 mg total) by mouth 3 (three) times a week., Disp: 36 capsule, Rfl: 3 .  levothyroxine (SYNTHROID) 25 MCG tablet, Take 1 tablet (25 mcg total) by mouth every morning., Disp: 90 tablet, Rfl: 0 .  liraglutide (VICTOZA) 18 MG/3ML SOPN, Inject 0.2 mLs (1.2 mg total) into the skin daily., Disp: 6 mL, Rfl: 3 .  losartan (COZAAR) 25 MG tablet, Take 1 tablet (25 mg total) by mouth daily., Disp: 90 tablet, Rfl: 0 .  Multiple Vitamins-Minerals (CENTRUM SILVER PO), Take 200 mg by mouth daily., Disp: , Rfl:  .  Needle, Disp, 32G X 5/16" MISC, 6 mm by Does not apply route 2 (two) times daily., Disp: 30 each, Rfl: 2 .  Omega-3 Fatty Acids (FISH OIL) 1000 MG CAPS, Take 1,000 mg by mouth daily., Disp: , Rfl:  .  rosuvastatin (CRESTOR) 5 MG tablet, TAKE 1 TABLET BY MOUTH  EVERY OTHER DAY, Disp: 45 tablet, Rfl: 3 .  Turmeric (QC TUMERIC COMPLEX) 500 MG CAPS, Take 500 mg by mouth daily., Disp: , Rfl:   Allergies  Allergen Reactions  . Niacin And Related Itching and Rash    I personally reviewed active problem list, medication list, allergies, family history, social history, health maintenance with the patient/caregiver today.   ROS  Constitutional: Negative for fever or significant weight change.  Respiratory: Negative for cough and shortness of breath.   Cardiovascular: Negative for chest pain or palpitations.  Gastrointestinal: Negative for abdominal pain, no bowel changes.  Musculoskeletal: Negative for gait problem or joint swelling.  Skin: Negative for rash.  Neurological: Negative for dizziness or headache.  No other specific complaints in a complete review of systems (except as listed in HPI above).  Objective  Vitals:   11/13/19 0949  BP: 122/84  Pulse: 67  Resp: (!) 22  Temp: 98.1 F (36.7 C)  TempSrc: Oral  SpO2: 96%  Weight: 228 lb 11.2 oz (103.7 kg)  Height: 5\' 8"  (1.727 m)    Body mass index  is 34.77 kg/m.  Physical Exam  Constitutional: Patient appears well-developed and well-nourished. Obese  No distress.  HEENT: head atraumatic, normocephalic, pupils equal and reactive to light,neck supple Cardiovascular: Normal rate, regular rhythm and normal heart sounds.  No murmur heard. Trace BLE edema. Pulmonary/Chest: Effort normal and breath sounds normal. No respiratory distress. Abdominal: Soft.  There is no tenderness. Psychiatric: Patient has a normal mood and affect. behavior is normal. Judgment and thought content normal.  Recent Results (from the past 2160 hour(s))  POCT HgB A1C     Status: Abnormal   Collection Time: 11/13/19  9:54 AM  Result Value Ref Range   Hemoglobin A1C 6.2 (A) 4.0 - 5.6 %   HbA1c POC (<> result, manual entry)     HbA1c, POC (prediabetic range)     HbA1c, POC (controlled diabetic range)        PHQ2/9: Depression screen [facility] 2/9 11/13/2019 08/13/2019 05/04/2019 03/01/2019 02/01/2019  Decreased Interest 0 1 0 0 0  Down, Depressed, Hopeless 0 1 0 0 0  PHQ - 2 Score 0 2 0 0 0  Altered sleeping - 1 0 - 0  Tired, decreased energy - 1 0 - 0  Change in appetite - 0 0 - 0  Feeling bad or failure about yourself  - 0 0 - 0  Trouble concentrating - 1 0 - 0  Moving slowly or fidgety/restless - 0 0 - 0  Suicidal thoughts - 0 0 - 0  PHQ-9 Score - 5 0 - 0  Difficult doing work/chores - Somewhat difficult Not difficult at all - Not difficult at all  Some recent data might be hidden    phq 9 is negative  Fall Risk: Fall Risk  11/13/2019 08/13/2019 05/04/2019 03/01/2019 02/01/2019  Falls in the past year? 1 1 1 1 1   Number falls in past yr: 0 0 1 1 1   Comment - - - - -  Injury with Fall? 1 1 1 1 1   Comment - Knee injury - - -  Risk for fall due to : History of fall(s) History of fall(s) - - -  Follow up - - - Falls prevention discussed Falls evaluation completed  Functional Status Survey: Is the patient deaf or have difficulty hearing?: Yes Does the  patient have difficulty seeing, even when wearing glasses/contacts?: No Does the patient have difficulty concentrating, remembering, or making decisions?: Yes (sometimes) Does the patient have difficulty walking or climbing stairs?: No Does the patient have difficulty dressing or bathing?: Yes Does the patient have difficulty doing errands alone such as visiting a doctor's office or shopping?: No    Assessment & Plan  1. Diabetes mellitus type 2 in obese (HCC)  - POCT HgB A1C  2. Mixed hyperlipidemia   3. Benign hypertension with chronic kidney disease, stage III   4. Need for immunization against influenza  - Flu Vaccine QUAD High Dose(Fluad)  5. Hypothyroidism due to acquired atrophy of thyroid  Last TSH is at goal   6. Stage 3a chronic kidney disease   7. Obesity (BMI 30.0-34.9)  Discussed with the patient the risk posed by an increased BMI. Discussed importance of portion control, calorie counting and at least 150 minutes of physical activity weekly. Avoid sweet beverages and drink more water. Eat at least 6 servings of fruit and vegetables daily   8. OSA on CPAP   9. Stable angina [facility])  He sees Dr. Okey Dupre  10. Chronic heart failure with preserved ejection fraction (HCC)   11. Essential hypertension  At goal   12. Thrombocytopenia (HCC)  Recheck next visit

## 2019-11-13 ENCOUNTER — Other Ambulatory Visit: Payer: Self-pay

## 2019-11-13 ENCOUNTER — Encounter: Payer: Self-pay | Admitting: Family Medicine

## 2019-11-13 ENCOUNTER — Ambulatory Visit (INDEPENDENT_AMBULATORY_CARE_PROVIDER_SITE_OTHER): Payer: Medicare Other | Admitting: Family Medicine

## 2019-11-13 VITALS — BP 122/84 | HR 67 | Temp 98.1°F | Resp 22 | Ht 68.0 in | Wt 228.7 lb

## 2019-11-13 DIAGNOSIS — N1831 Chronic kidney disease, stage 3a: Secondary | ICD-10-CM

## 2019-11-13 DIAGNOSIS — E782 Mixed hyperlipidemia: Secondary | ICD-10-CM

## 2019-11-13 DIAGNOSIS — I129 Hypertensive chronic kidney disease with stage 1 through stage 4 chronic kidney disease, or unspecified chronic kidney disease: Secondary | ICD-10-CM | POA: Diagnosis not present

## 2019-11-13 DIAGNOSIS — E669 Obesity, unspecified: Secondary | ICD-10-CM

## 2019-11-13 DIAGNOSIS — Z9989 Dependence on other enabling machines and devices: Secondary | ICD-10-CM

## 2019-11-13 DIAGNOSIS — I10 Essential (primary) hypertension: Secondary | ICD-10-CM

## 2019-11-13 DIAGNOSIS — G4733 Obstructive sleep apnea (adult) (pediatric): Secondary | ICD-10-CM

## 2019-11-13 DIAGNOSIS — Z23 Encounter for immunization: Secondary | ICD-10-CM | POA: Diagnosis not present

## 2019-11-13 DIAGNOSIS — N183 Chronic kidney disease, stage 3 unspecified: Secondary | ICD-10-CM

## 2019-11-13 DIAGNOSIS — D696 Thrombocytopenia, unspecified: Secondary | ICD-10-CM

## 2019-11-13 DIAGNOSIS — E1169 Type 2 diabetes mellitus with other specified complication: Secondary | ICD-10-CM | POA: Diagnosis not present

## 2019-11-13 DIAGNOSIS — I5032 Chronic diastolic (congestive) heart failure: Secondary | ICD-10-CM

## 2019-11-13 DIAGNOSIS — E034 Atrophy of thyroid (acquired): Secondary | ICD-10-CM

## 2019-11-13 DIAGNOSIS — I208 Other forms of angina pectoris: Secondary | ICD-10-CM

## 2019-11-13 LAB — POCT GLYCOSYLATED HEMOGLOBIN (HGB A1C): Hemoglobin A1C: 6.2 % — AB (ref 4.0–5.6)

## 2019-11-13 NOTE — Patient Instructions (Addendum)
Try increasing crestor/rosuvasatin to daily instead of three times a week and see if you can tolerate it  Add Co-Q 10 otc to decrease pain if needed

## 2019-11-26 ENCOUNTER — Other Ambulatory Visit: Payer: Self-pay | Admitting: Internal Medicine

## 2019-11-27 ENCOUNTER — Other Ambulatory Visit: Payer: Self-pay

## 2019-11-27 MED ORDER — FAMOTIDINE 20 MG PO TABS
20.0000 mg | ORAL_TABLET | Freq: Every day | ORAL | 0 refills | Status: DC
Start: 2019-11-27 — End: 2020-02-12

## 2019-11-30 ENCOUNTER — Other Ambulatory Visit: Payer: Self-pay | Admitting: Family Medicine

## 2019-11-30 DIAGNOSIS — E034 Atrophy of thyroid (acquired): Secondary | ICD-10-CM

## 2019-12-12 ENCOUNTER — Other Ambulatory Visit: Payer: Self-pay | Admitting: Internal Medicine

## 2019-12-31 NOTE — Progress Notes (Signed)
Follow-up Outpatient Visit Date: 01/02/2020  Primary Care Provider: Alba Cory, MD [address] [place] [address]  Chief Complaint: Chest pain and shortness of breath  HPI:  [name] is a 79 y.o. male with history of chronic HFpEF, hypertension, hyperlipidemia, chronic kidney disease stage III, GERD, and obesity, who presents for follow-up of HFpEF.  I last saw him in early May, at which time he was feeling relatively well.  He endorsed stable exertional dyspnea and a single episode of chest pain and dyspnea that occurred while trying to get ants off his legs after stepping on an ant next.  We did not make any medication changes or pursue additional testing at that time.  Today, [name] reports that his chronic exertional dyspnea is unchanged.  He is able to do most ADLs without any difficulty.  He notes an episode of chest "squeezing" that occurred while he was walking around his property a couple of days ago.  Pain resolved after resting for 1 to 2 minutes.  It has not been present at other times, including when he does strenuous activity.  He denies palpitations, lightheadedness, and orthopnea.  He has experienced occasional swelling along the dorsum of the right foot but no pretibial edema.  --------------------------------------------------------------------------------------------------  Cardiovascular History & Procedures: Cardiovascular Problems:  Dyspnea on exertion and chest pain  Leg pain  Risk Factors:  Hypertension, hyperlipidemia, diabetes mellitus, male gender, and age > 16  Cath/PCI:  None  CV Surgery:  None  EP Procedures and Devices:  None  Non-Invasive Evaluation(s):  Pharmacologic MPI (06/09/16): Low risk study without ischemia. Normal wall motion with LVEF of 72%.  TTE (03/17/16): Normal LV size percent. There is grade 1 diastolic dysfunction. Aortic valve is moderately thickened without stenosis. There is mild aortic  regurgitation. Normal RV size and function.  Lower extremity arterial vascular study (12/22/15): ABIs right 1.5, left 1.6. TBIsright 1.3, left 1.1.  Recent CV Pertinent Labs: Lab Results  Component Value Date   CHOL 146 05/04/2019   HDL 30 (L) 05/04/2019   LDLCALC 85 05/04/2019   TRIG 221 (H) 05/04/2019   CHOLHDL 4.9 05/04/2019   INR 1.32 12/22/2013   K 4.1 05/04/2019   MG 1.9 12/21/2013   BUN 14 05/04/2019   CREATININE 1.22 (H) 05/04/2019    Past medical and surgical history were reviewed and updated in EPIC.  Current Meds  Medication Sig  . acetaminophen (TYLENOL) 500 MG tablet Take 500 mg by mouth every 6 (six) hours as needed.  Marland Kitchen allopurinol (ZYLOPRIM) 100 MG tablet Take 100 mg by mouth 2 (two) times daily.  Marland Kitchen amLODipine (NORVASC) 5 MG tablet TAKE 1 TABLET BY MOUTH  DAILY  . aspirin EC 81 MG tablet Take 81 mg by mouth daily.  . carvedilol (COREG) 3.125 MG tablet TAKE 1 TABLET BY MOUTH  TWICE DAILY  . Cholecalciferol (VITAMIN D3) 25 MCG (1000 UT) CAPS Take 1,000 Units by mouth daily.  . Coenzyme Q10 (CO Q 10) 100 MG CAPS Take 100 mg by mouth daily.  . famotidine (PEPCID) 20 MG tablet Take 1 tablet (20 mg total) by mouth daily.  . hydrochlorothiazide (MICROZIDE) 12.5 MG capsule Take 1 capsule (12.5 mg total) by mouth 3 (three) times a week.  . levothyroxine (SYNTHROID) 25 MCG tablet TAKE 1 TABLET BY MOUTH IN  THE MORNING  . liraglutide (VICTOZA) 18 MG/3ML SOPN Inject 0.2 mLs (1.2 mg total) into the skin daily.  Marland Kitchen losartan (COZAAR) 25 MG tablet  TAKE 1 TABLET BY MOUTH  DAILY  . Multiple Vitamins-Minerals (CENTRUM SILVER PO) Take 200 mg by mouth daily.  . Needle, Disp, 32G X 5/16" MISC 6 mm by Does not apply route 2 (two) times daily.  . Omega-3 Fatty Acids (FISH OIL) 1000 MG CAPS Take 1,000 mg by mouth daily.  . rosuvastatin (CRESTOR) 5 MG tablet TAKE 1 TABLET BY MOUTH  EVERY OTHER DAY  . Turmeric (QC TUMERIC COMPLEX) 500 MG CAPS Take 500 mg by mouth daily.    Allergies:  Niacin and related  Social History   Tobacco Use  . Smoking status: Former Smoker    Types: Cigarettes, Cigars    Quit date: 1990    Years since quitting: 31.8  . Smokeless tobacco: Never Used  . Tobacco comment: Smoked afternoon cigar  Vaping Use  . Vaping Use: Never used  Substance Use Topics  . Alcohol use: No  . Drug use: No    Family History  Problem Relation Age of Onset  . Hyperlipidemia Father   . Kidney disease Father   . Heart disease Father   . Congestive Heart Failure Mother   . Heart disease Mother   . Hyperlipidemia Daughter   . Hyperlipidemia Son   . Diabetes Son   . Anxiety disorder Daughter   . Depression Daughter   . Arthritis Daughter     Review of Systems: A 12-system review of systems was performed and was negative except as noted in the HPI.  --------------------------------------------------------------------------------------------------  Physical Exam: BP 120/80 (BP Location: Left Arm, Patient Position: Sitting, Cuff Size: Normal)   Pulse 60   Ht 5\' 8"  (1.727 m)   Wt 230 lb (104.3 kg)   SpO2 96%   BMI 34.97 kg/m   General: NAD. Neck: No JVD or HJR. Lungs: Clear to auscultation bilateral without wheezes or crackles. Heart: Regular rate and rhythm without murmurs, rubs, or gallops. Abdomen: Soft, nontender, nondistended. Extremities: Trace pretibial edema.  EKG: Normal sinus rhythm with low voltage, left axis deviation, poor R wave progression, and nonspecific T wave changes.  No significant change from prior tracing on 06/28/2019.  Lab Results  Component Value Date   WBC 5.6 05/04/2019   HGB 15.3 05/04/2019   HCT 45.5 05/04/2019   MCV 96.2 05/04/2019   PLT 137 (L) 05/04/2019    Lab Results  Component Value Date   NA 143 05/04/2019   K 4.1 05/04/2019   CL 108 05/04/2019   CO2 24 05/04/2019   BUN 14 05/04/2019   CREATININE 1.22 (H) 05/04/2019   GLUCOSE 105 (H) 05/04/2019   ALT 17 05/04/2019    Lab Results  Component  Value Date   CHOL 146 05/04/2019   HDL 30 (L) 05/04/2019   LDLCALC 85 05/04/2019   TRIG 221 (H) 05/04/2019   CHOLHDL 4.9 05/04/2019    --------------------------------------------------------------------------------------------------  ASSESSMENT AND PLAN: Chest pain: [name] reports a single episode of typical angina while walking around his property.  He has not had any further pain with strenuous activities.  Myoview in 2018 with a low risk.  However, given his new chest pain and risk factors, we have agreed to obtain a cardiac CTA for further evaluation.  Chronic HFpEF: [name] has stable exertional dyspnea consistent with NYHA class II heart failure.  He has trace pedal edema but otherwise appears euvolemic.  We will continue his current medications.  Hypertension: Blood pressure upper normal today.  No medication changes at this time.  Hyperlipidemia: Continue  rosuvastatin.  If there is evidence of significant ASCVD on cardiac CTA, escalation will need to be considered in the future.  Follow-up: Return to clinic after completion of cardiac CTA.  Yvonne Kendall, MD 01/02/2020 9:35 AM

## 2020-01-02 ENCOUNTER — Encounter: Payer: Self-pay | Admitting: Internal Medicine

## 2020-01-02 ENCOUNTER — Ambulatory Visit: Payer: Medicare Other | Admitting: Internal Medicine

## 2020-01-02 ENCOUNTER — Other Ambulatory Visit: Payer: Self-pay

## 2020-01-02 VITALS — BP 120/80 | HR 60 | Ht 68.0 in | Wt 230.0 lb

## 2020-01-02 DIAGNOSIS — I10 Essential (primary) hypertension: Secondary | ICD-10-CM | POA: Diagnosis not present

## 2020-01-02 DIAGNOSIS — E782 Mixed hyperlipidemia: Secondary | ICD-10-CM

## 2020-01-02 DIAGNOSIS — R0600 Dyspnea, unspecified: Secondary | ICD-10-CM

## 2020-01-02 DIAGNOSIS — R079 Chest pain, unspecified: Secondary | ICD-10-CM

## 2020-01-02 DIAGNOSIS — I5032 Chronic diastolic (congestive) heart failure: Secondary | ICD-10-CM

## 2020-01-02 MED ORDER — METOPROLOL TARTRATE 25 MG PO TABS: 25.0000 mg | ORAL_TABLET | Freq: Once | ORAL | 0 refills | Status: DC

## 2020-01-02 NOTE — Patient Instructions (Signed)
Medication Instructions:   Your physician recommends that you continue on your current medications as directed. Please refer to the Current Medication list given to you today.   *If you need a refill on your cardiac medications before your next appointment, please call your pharmacy*   Lab Work:  - You will need lab work prior to the cardiac CT. Once you have been called and know the date of the procedure you may go to the medical mall to have done. Lab work must be done within 30 days of procedure.  -  Please go to the [facility]. You will check in at the front desk to the right as you walk into the atrium. Valet Parking is offered if needed.  - No appointment needed. You may go any day between 7 am and 6 pm.   If you have labs (blood work) drawn today and your tests are completely normal, you will receive your results only by: Marland Kitchen MyChart Message (if you have MyChart) OR . A paper copy in the mail If you have any lab test that is abnormal or we need to change your treatment, we will call you to review the results.   Testing/Procedures:  Your physician has requested that you have cardiac CT. Cardiac computed tomography (CT) is a painless test that uses an x-ray machine to take clear, detailed pictures of your heart. For further information please visit [email or web address]. Please follow instruction sheet as given.  Your cardiac CT will be scheduled at one of the below locations:   [facility] [address] [place], [place] [address] [phone]  [facility] [address] [place], [place] [address] [phone]  If scheduled at [facility], please arrive at the [facility] main entrance of [facility] 30 minutes prior to test start time. Proceed to the [facility] Radiology Department (first floor) to check-in and test prep.  If scheduled at [facility], please  arrive 15 mins early for check-in and test prep.  Please follow these instructions carefully (unless otherwise directed):  Hold all erectile dysfunction medications at least 3 days (72 hrs) prior to test.  On the Night Before the Test: . Be sure to Drink plenty of water. . Do not consume any caffeinated/decaffeinated beverages or chocolate 12 hours prior to your test. . Do not take any antihistamines 12 hours prior to your test.   On the Day of the Test: . Drink plenty of water. Do not drink any water within one hour of the test. . Do not eat any food 4 hours prior to the test. . You may take your regular medications prior to the test.  . Take metoprolol (Lopressor) 61m two hours prior to test. . HOLD Hydrochlorothiazide morning of the test.        After the Test: . Drink plenty of water. . After receiving IV contrast, you may experience a mild flushed feeling. This is normal. . On occasion, you may experience a mild rash up to 24 hours after the test. This is not dangerous. If this occurs, you can take Benadryl 25 mg and increase your fluid intake. . If you experience trouble breathing, this can be serious. If it is severe call 911 IMMEDIATELY. If it is mild, please call our office. . If you take any of these medications: Glipizide/Metformin, Avandament, Glucavance, please do not take 48 hours after completing test unless otherwise instructed.  Once we have confirmed authorization from your insurance company, we will call you to set up a date and time for your test. Based on how quickly your insurance processes prior authorizations requests, please allow up to 4 weeks to be contacted for scheduling your Cardiac CT appointment. Be advised that routine Cardiac CT appointments could be scheduled as many as 8 weeks after your provider has ordered it.  For non-scheduling related questions, please contact the cardiac imaging nurse navigator should you have any questions/concerns: Marchia Bond, Cardiac Imaging Nurse Navigator Burley Saver, Interim Cardiac Imaging Nurse [facility] [facility] Direct Office Dial: [phone]   For scheduling needs, including cancellations and rescheduling, please call Vivien Rota at [phone], option [phone].      Follow-Up: At [facility], you and your health needs are our priority.  As part of our continuing mission to provide you with exceptional heart care, we have created designated Provider Care Teams.  These Care Teams include your primary Cardiologist (physician) and Advanced Practice Providers (APPs -  Physician Assistants and Nurse Practitioners) who all work together to provide you with the care you need, when you need it.  We recommend signing up for the patient portal called "MyChart".  Sign up information is provided on this After Visit Summary.  MyChart is used to connect with patients for Virtual Visits (Telemedicine).  Patients are able to view lab/test results, encounter notes, upcoming appointments, etc.  Non-urgent messages can be sent to your provider as well.   To learn more about what you can do with MyChart, go to [email or web address].    Your next appointment:    Follow up after Cardiac CTA approximately 5-7 weeks  The format for your next appointment:   In Person  Provider:   You may see Nelva Bush, MD or one of the following Advanced Practice Providers on your designated Care Team:    Murray Hodgkins, NP  Christell Faith, PA-C  Marrianne Mood, PA-C  Cadence Kathlen Mody, [place]    Cardiac CT Angiogram A cardiac CT angiogram is a procedure to look at the heart and the area around the heart. It may be done to help find the cause of chest pains or other symptoms of heart disease. During this procedure, a substance called contrast dye is injected into the blood vessels in the area to be checked. A large X-ray machine, called a CT scanner, then takes detailed pictures of the heart and the  surrounding area. The procedure is also sometimes called a coronary CT angiogram, coronary artery scanning, or CTA. A cardiac CT angiogram allows the health care provider to see how well blood is flowing to and from the heart. The health care provider will be able to see if there are any problems, such as:  Blockage or narrowing of the coronary arteries in the heart.  Fluid around the heart.  Signs of weakness or disease in the muscles, valves, and tissues of the heart. Tell a health care provider about:  Any allergies you have. This is especially important if you have had a previous allergic reaction to contrast dye.  All medicines you are taking, including vitamins, herbs, eye drops, creams, and over-the-counter medicines.  Any blood disorders you have.  Any surgeries you have had.  Any medical conditions you have.  Whether you are pregnant or may be pregnant.  Any anxiety disorders, chronic pain, or other conditions you have that may increase your stress or prevent you from lying still. What are  the risks? Generally, this is a safe procedure. However, problems may occur, including:  Bleeding.  Infection.  Allergic reactions to medicines or dyes.  Damage to other structures or organs.  Kidney damage from the contrast dye that is used.  Increased risk of cancer from radiation exposure. This risk is low. Talk with your health care provider about: ? The risks and benefits of testing. ? How you can receive the lowest dose of radiation. What happens before the procedure?  Wear comfortable clothing and remove any jewelry, glasses, dentures, and hearing aids.  Follow instructions from your health care provider about eating and drinking. This may include: ? For 12 hours before the procedure -- avoid caffeine. This includes tea, coffee, soda, energy drinks, and diet pills. Drink plenty of water or other fluids that do not have caffeine in them. Being well hydrated can prevent  complications. ? For 4-6 hours before the procedure -- stop eating and drinking. The contrast dye can cause nausea, but this is less likely if your stomach is empty.  Ask your health care provider about changing or stopping your regular medicines. This is especially important if you are taking diabetes medicines, blood thinners, or medicines to treat problems with erections (erectile dysfunction). What happens during the procedure?   Hair on your chest may need to be removed so that small sticky patches called electrodes can be placed on your chest. These will transmit information that helps to monitor your heart during the procedure.  An IV will be inserted into one of your veins.  You might be given a medicine to control your heart rate during the procedure. This will help to ensure that good images are obtained.  You will be asked to lie on an exam table. This table will slide in and out of the CT machine during the procedure.  Contrast dye will be injected into the IV. You might feel warm, or you may get a metallic taste in your mouth.  You will be given a medicine called nitroglycerin. This will relax or dilate the arteries in your heart.  The table that you are lying on will move into the CT machine tunnel for the scan.  The person running the machine will give you instructions while the scans are being done. You may be asked to: ? Keep your arms above your head. ? Hold your breath. ? Stay very still, even if the table is moving.  When the scanning is complete, you will be moved out of the machine.  The IV will be removed. The procedure may vary among health care providers and hospitals. What can I expect after the procedure? After your procedure, it is common to have:  A metallic taste in your mouth from the contrast dye.  A feeling of warmth.  A headache from the nitroglycerin. Follow these instructions at home:  Take over-the-counter and prescription medicines only as  told by your health care provider.  If you are told, drink enough fluid to keep your urine pale yellow. This will help to flush the contrast dye out of your body.  Most people can return to their normal activities right after the procedure. Ask your health care provider what activities are safe for you.  It is up to you to get the results of your procedure. Ask your health care provider, or the department that is doing the procedure, when your results will be ready.  Keep all follow-up visits as told by your health care provider. This  is important. Contact a health care provider if:  You have any symptoms of allergy to the contrast dye. These include: ? Shortness of breath. ? Rash or hives. ? A racing heartbeat. Summary  A cardiac CT angiogram is a procedure to look at the heart and the area around the heart. It may be done to help find the cause of chest pains or other symptoms of heart disease.  During this procedure, a large X-ray machine, called a CT scanner, takes detailed pictures of the heart and the surrounding area after a contrast dye has been injected into blood vessels in the area.  Ask your health care provider about changing or stopping your regular medicines before the procedure. This is especially important if you are taking diabetes medicines, blood thinners, or medicines to treat erectile dysfunction.  If you are told, drink enough fluid to keep your urine pale yellow. This will help to flush the contrast dye out of your body. This information is not intended to replace advice given to you by your health care provider. Make sure you discuss any questions you have with your health care provider. Document Revised: 10/04/2018 Document Reviewed: 10/04/2018 Elsevier Patient Education  [employer].

## 2020-01-03 ENCOUNTER — Encounter: Payer: Self-pay | Admitting: Internal Medicine

## 2020-01-08 ENCOUNTER — Other Ambulatory Visit: Payer: Self-pay

## 2020-01-08 ENCOUNTER — Other Ambulatory Visit
Admission: RE | Admit: 2020-01-08 | Discharge: 2020-01-08 | Disposition: A | Discharge status: Home / Self Care | Payer: Medicare Other | Attending: Internal Medicine | Admitting: Internal Medicine

## 2020-01-08 DIAGNOSIS — R079 Chest pain, unspecified: Secondary | ICD-10-CM | POA: Diagnosis present

## 2020-01-08 DIAGNOSIS — I5032 Chronic diastolic (congestive) heart failure: Secondary | ICD-10-CM | POA: Diagnosis present

## 2020-01-08 LAB — BASIC METABOLIC PANEL
Anion gap: 11 (ref 5–15)
BUN: 17 mg/dL (ref 8–23)
CO2: 23 mmol/L (ref 22–32)
Calcium: 9.6 mg/dL (ref 8.9–10.3)
Chloride: 104 mmol/L (ref 98–111)
Creatinine, Ser: 1.32 mg/dL — ABNORMAL HIGH (ref 0.61–1.24)
GFR, Estimated: 55 mL/min — ABNORMAL LOW (ref >60)
Glucose, Bld: 135 mg/dL — ABNORMAL HIGH (ref 70–99)
Potassium: 4.1 mmol/L (ref 3.5–5.1)
Sodium: 138 mmol/L (ref 135–145)

## 2020-01-09 ENCOUNTER — Telehealth (HOSPITAL_COMMUNITY): Payer: Self-pay | Admitting: *Deleted

## 2020-01-09 ENCOUNTER — Telehealth: Payer: Self-pay | Admitting: *Deleted

## 2020-01-09 NOTE — Telephone Encounter (Signed)
No Left detailed message with results, ok per DPR, and to call back if any questions.   While creating telephone encounter, patient called back. The patient has been notified of the result and verbalized understanding.  All questions (if any) were answered. Sherlynn Stalls Marilu Rylander, RN 01/09/2020 1:46 PM

## 2020-01-09 NOTE — Telephone Encounter (Signed)

## 2020-01-09 NOTE — Telephone Encounter (Signed)
-----   Message from Yvonne Kendall, MD sent at 01/08/2020  6:58 PM EST ----- Please let Drew [name] know that his creatinine is slightly elevated but at his baseline.  I think it is okay for him to proceed with cardiac CTA.  He should drink plenty of water before and after the study to help clear the contrast and minimize the risk for kidney damage.

## 2020-01-09 NOTE — Telephone Encounter (Signed)
Attempted to call patient regarding upcoming cardiac CT appointment. Left message on voicemail with name and callback number  Chade Pitner Tai RN Navigator Cardiac Imaging [facility] [phone] Office [phone] Cell  

## 2020-01-10 ENCOUNTER — Ambulatory Visit
Admission: RE | Admit: 2020-01-10 | Discharge: 2020-01-10 | Disposition: A | Discharge status: Home / Self Care | Payer: Medicare Other | Source: Ambulatory Visit | Attending: Internal Medicine | Admitting: Internal Medicine

## 2020-01-10 ENCOUNTER — Ambulatory Visit: Admission: RE | Admit: 2020-01-10 | Payer: Medicare Other | Source: Ambulatory Visit

## 2020-01-10 ENCOUNTER — Other Ambulatory Visit: Payer: Self-pay

## 2020-01-10 DIAGNOSIS — K449 Diaphragmatic hernia without obstruction or gangrene: Secondary | ICD-10-CM | POA: Diagnosis not present

## 2020-01-10 DIAGNOSIS — R079 Chest pain, unspecified: Secondary | ICD-10-CM | POA: Diagnosis present

## 2020-01-10 DIAGNOSIS — I5032 Chronic diastolic (congestive) heart failure: Secondary | ICD-10-CM

## 2020-01-10 DIAGNOSIS — I700 Atherosclerosis of aorta: Secondary | ICD-10-CM | POA: Diagnosis not present

## 2020-01-10 MED ORDER — NITROGLYCERIN 0.4 MG SL SUBL
0.8000 mg | SUBLINGUAL_TABLET | Freq: Once | SUBLINGUAL | Status: AC
  Administered 2020-01-10: 0.8 mg via SUBLINGUAL

## 2020-01-10 MED ORDER — IOHEXOL 350 MG/ML SOLN
100.0000 mL | Freq: Once | INTRAVENOUS | Status: AC | PRN
  Administered 2020-01-10: 90 mL via INTRAVENOUS

## 2020-01-10 NOTE — Progress Notes (Signed)
Patient tolerated procedure well. Ambulate w/o difficulty. Sitting in chair drinking water provided. Encouraged to drink extra water today and reasoning explained. Verbalized understanding. All questions answered. ABC intact. No further needs. Discharge from procedure area w/o issues.  

## 2020-01-11 ENCOUNTER — Telehealth: Payer: Self-pay | Admitting: *Deleted

## 2020-01-11 ENCOUNTER — Encounter: Payer: Self-pay | Admitting: Internal Medicine

## 2020-01-11 DIAGNOSIS — I5032 Chronic diastolic (congestive) heart failure: Secondary | ICD-10-CM

## 2020-01-11 DIAGNOSIS — Z79899 Other long term (current) drug therapy: Secondary | ICD-10-CM

## 2020-01-11 DIAGNOSIS — E782 Mixed hyperlipidemia: Secondary | ICD-10-CM

## 2020-01-11 MED ORDER — ROSUVASTATIN CALCIUM 10 MG PO TABS: 10.0000 mg | ORAL_TABLET | Freq: Every day | ORAL | 2 refills | Status: DC

## 2020-01-11 NOTE — Telephone Encounter (Signed)
The patient has been notified of the result and verbalized understanding.  All questions (if any) were answered. He is aware to increase rosuvastatin to 10 mg daily and to get repeat fasting lab work in 3 months (~ end of February). Rx sent to pharmacy.

## 2020-01-11 NOTE — Telephone Encounter (Signed)
-----   Message from Yvonne Kendall, MD sent at 01/11/2020 11:05 AM EST ----- Please let Mr. Haff know that his cardiac CTA shows mild to moderate narrowing of his heart arteries but no critical blockage to explain his symptoms.  A hiatal hernia was noted, which can sometimes cause chest pain.  I suggest that we try to increase his rosuvastatin to 10 mg daily to help prevent/slow the progression of his coronary artery disease.  We should recheck a lipid panel and ALT in ~3 months.  He should let us know if he develops myalgias on the increased dose.

## 2020-01-15 ENCOUNTER — Ambulatory Visit: Payer: Self-pay

## 2020-01-15 DIAGNOSIS — I10 Essential (primary) hypertension: Secondary | ICD-10-CM

## 2020-01-15 DIAGNOSIS — E782 Mixed hyperlipidemia: Secondary | ICD-10-CM

## 2020-01-15 NOTE — Chronic Care Management (AMB) (Signed)
Chronic Care Management Pharmacy  Name: DEVAUGHN SAVANT  MRN: [id number] DOB: 10-16-1940  Chief Complaint/ HPI  [name],  79 y.o. , male presents for their Follow-Up CCM visit with the clinical pharmacist via telephone due to COVID-19 Pandemic.  PCP : Steele Sizer, MD  Their chronic conditions include: DM, HTN, HLD  Office Visits: 11/13/19: Patient presented to Dr. Ancil Boozer for follow-up. A1c stable at 6.2%. No medication changes made.   Consult Visit: 01/02/20: Patient presented to Dr. Saunders Revel for Chest pain. CT showed mild-moderate atherosclerosis. Rosuvastatin increased to 10 mg daily.   Medications: Outpatient Encounter Medications as of 01/15/2020  Medication Sig Note  . amLODipine (NORVASC) 5 MG tablet TAKE 1 TABLET BY MOUTH  DAILY   . acetaminophen (TYLENOL) 500 MG tablet Take 500 mg by mouth every 6 (six) hours as needed.   Marland Kitchen allopurinol (ZYLOPRIM) 100 MG tablet Take 100 mg by mouth 2 (two) times daily. 01/15/2020: Prescribed by Dr. Murlean Iba  . aspirin EC 81 MG tablet Take 81 mg by mouth daily.   . carvedilol (COREG) 3.125 MG tablet TAKE 1 TABLET BY MOUTH  TWICE DAILY   . Cholecalciferol (VITAMIN D3) 25 MCG (1000 UT) CAPS Take 1,000 Units by mouth daily.   . Coenzyme Q10 (CO Q 10) 100 MG CAPS Take 100 mg by mouth daily.   . famotidine (PEPCID) 20 MG tablet Take 1 tablet (20 mg total) by mouth daily.   . hydrochlorothiazide (MICROZIDE) 12.5 MG capsule Take 1 capsule (12.5 mg total) by mouth 3 (three) times a week.   . levothyroxine (SYNTHROID) 25 MCG tablet TAKE 1 TABLET BY MOUTH IN  THE MORNING   . liraglutide (VICTOZA) 18 MG/3ML SOPN Inject 0.2 mLs (1.2 mg total) into the skin daily.   Marland Kitchen losartan (COZAAR) 25 MG tablet TAKE 1 TABLET BY MOUTH  DAILY   . Multiple Vitamins-Minerals (CENTRUM SILVER PO) Take 200 mg by mouth daily.   . Needle, Disp, 32G X 5/16" MISC 6 mm by Does not apply route 2 (two) times daily.   . Omega-3 Fatty Acids (FISH OIL) 1000 MG CAPS Take  1,000 mg by mouth daily.   . rosuvastatin (CRESTOR) 10 MG tablet Take 1 tablet (10 mg total) by mouth daily.   . Turmeric (QC TUMERIC COMPLEX) 500 MG CAPS Take 500 mg by mouth daily.   . [DISCONTINUED] metoprolol tartrate (LOPRESSOR) 25 MG tablet Take 1 tablet (25 mg total) by mouth once for 1 dose. TWO hours prior to cardiac CT    No facility-administered encounter medications on file as of 01/15/2020.      Financial Resource Strain: Low Risk   . Difficulty of Paying Living Expenses: Not hard at all   Current Diagnosis/Assessment:  SDOH Interventions     Most Recent Value  SDOH Interventions  Financial Strain Interventions Intervention Not Indicated  Transportation Interventions Intervention Not Indicated     Goals Addressed            This Visit's Progress   . Chronic Care Management       CARE PLAN ENTRY  Current Barriers:  . Chronic Disease Management support, education, and care coordination needs related to Hypertension, Hyperlipidemia, Diabetes, Atrial Fibrillation, and Gastroesophageal Reflux Disease   Hypertension . Pharmacist Clinical Goal(s): o Over the next 90 days, patient will work with PharmD and providers to maintain BP goal <140/90 . Current regimen:  . Losartan 25 mg daily  . Carvedilol 3.125 mg twice daily  .  Amlodipine 5 mg daily  . HCTZ 12.5 mg three times weekly . Patient self care activities - Over the next 90 days, patient will: o Check BP daily, document, and provide at future appointments o Take hydrochlorothiazide daily by timing with outdoor activities o Ensure daily salt intake < 2300 mg/day  Hyperlipidemia . Pharmacist Clinical Goal(s): o Over the next 90 days, patient will work with PharmD and providers to achieve LDL goal < 70 . Current regimen:  o Rosuvastatin 10 mg daily   Diabetes . Pharmacist Clinical Goal(s): o Over the next 90 days, patient will work with PharmD and providers to maintain A1c goal <7% . Current regimen:   o Victoza 1.11m injected daily . Patient self care activities - Over the next 90 days, patient will: o Check blood sugar once daily, document, and provide at future appointments o Contact provider with any episodes of hypoglycemia  Medication management . Pharmacist Clinical Goal(s): o Over the next 90 days, patient will work with PharmD and providers to achieve optimal medication adherence . Current pharmacy: [facility] . Interventions o Comprehensive medication review performed. o Corrections to medication list o Continue current medication management strategy . Patient self care activities - Over the next 90 days, patient will: o Focus on medication adherence by taking medications as prescribed o Report any questions or concerns to PharmD and/or provider(s)       Diabetes   Recent Relevant Labs: Lab Results  Component Value Date/Time   HGBA1C 6.2 (A) 11/13/2019 09:54 AM   HGBA1C 6.2 (H) 05/04/2019 10:02 AM   HGBA1C 5.9 (H) 08/02/2018 11:14 AM   MICROALBUR 2.1 08/02/2018 11:14 AM   MICROALBUR 3.3 05/31/2017 09:10 AM    Patient is currently controlled on the following medications:   Victoza 1.2 mg weekly  Last diabetic Foot exam:  Lab Results  Component Value Date/Time   HMDIABEYEEXA No Retinopathy 06/25/2019 12:00 AM    Last diabetic Eye exam: No results found for: HMDIABFOOTEX   We discussed: Counseled patient on   Plan  Continue current medications  Hypertension   BP goal is:  <140/90  Office blood pressures are  BP Readings from Last 3 Encounters:  01/10/20 117/75  01/02/20 120/80  11/13/19 122/84   Patient checks BP at home Never Patient home BP readings are ranging: n/a  Patient has failed these meds in the past: n/a Patient is currently controlled on the following medications:  . Losartan 25 mg daily  . Carvedilol 3.125 mg twice daily  . Amlodipine 5 mg daily  . HCTZ 12.5 mg three times weekly   We discussed: Has not been checking blood  pressure at home. Needs to replace batteries on his monitor.   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     Managed by Dr. ESaunders Revel  LDL goal < 70  Lipid Panel     Component Value Date/Time   CHOL 146 05/04/2019 1002   TRIG 221 (H) 05/04/2019 1002   HDL 30 (L) 05/04/2019 1002   LDLCALC 85 05/04/2019 1002    Hepatic Function Latest Ref Rng & Units 05/04/2019 08/02/2018 05/31/2017  Total Protein 6.1 - 8.1 g/dL 6.5 6.6 6.7  Albumin 3.6 - 5.1 g/dL - - -  AST 10 - 35 U/L _0 ALT 9 - 46 U/L 17 23 33  Alk Phosphatase 40 - 115 U/L - - -  Total Bilirubin 0.2 -  1.2 mg/dL 0.8 1.0 0.8     The ASCVD Risk score (Goff DC Jr., et al., 2013) failed to calculate for the following reasons:   The 2013 ASCVD risk score is only valid for ages 40 to 79   Patient has failed these meds in past: Atorvastatin 40, pitavastatin, fenofibrate,  Patient is currently uncontrolled on the following medications:  . Rosuvastatin 10 mg daily   We discussed: Doing well with increased dose increase. Denies myalgias or other intolerances.    Plan  Continue current medications   Gout   Managed by Dr. Harmeet Singh   Uric Acid, Serum  Date Value Ref Range Status  05/04/2019 6.6 4.0 - 8.0 mg/dL Final    Comment:    Therapeutic target for gout patients: <6.0 mg/dL .   08/30/2017 7.3 4.0 - 8.0 mg/dL Final    Comment:    Therapeutic target for gout patients: <6.0 mg/dL .   07/04/2017 5.7 4.0 - 8.0 mg/dL Final    Comment:    Therapeutic target for gout patients: <6.0 mg/dL .      Goal Uric Acid < 6 mg/dL   Medications that may increase uric acid levels: Thiazide Diuretics  Last gout flare: n/a  Patient has failed these meds in past: n/a Patient is currently controlled on the following medications:  . Allopurinol 100 mg twice daily   We discussed:  Counseled patient on low purine diet plan. Counseled patient to  reduce consumption of high-fructose corn syrup, sweetened soft drinks, fruit juices, meat, and seafood.  Plan  Continue current medications  Vaccines   Reviewed and discussed patient's vaccination history.    Immunization History  Administered Date(s) Administered  . Fluad Quad(high Dose 65+) 11/13/2019  . Influenza, High Dose Seasonal PF 11/06/2014, 11/24/2015, 11/30/2016, 11/30/2017  . Influenza-Unspecified 12/29/2018  . PFIZER SARS-COV-2 Vaccination 04/05/2019, 04/30/2019  . Pneumococcal Conjugate-13 12/14/2013  . Pneumococcal Polysaccharide-23 10/02/2009   Medication Management   Uses pill box? Yes Pt endorses 100% compliance  We discussed:  Needs to change CPAP air filter every 2 weeks Can't take melatonin due to day time somnolence  Plan  Continue current medication management strategy  Follow up: 3 month phone visit  Alex Fleury,PharmD Clinical Pharmacist [facility] [phone]   

## 2020-01-15 NOTE — Patient Instructions (Signed)
Visit Information It was great speaking with you today!  Please let me know if you have any questions about our visit. Goals Addressed            This Visit's Progress   . Chronic Care Management       CARE PLAN ENTRY  Current Barriers:  . Chronic Disease Management support, education, and care coordination needs related to Hypertension, Hyperlipidemia, Diabetes, Atrial Fibrillation, and Gastroesophageal Reflux Disease   Hypertension . Pharmacist Clinical Goal(s): o Over the next 90 days, patient will work with PharmD and providers to maintain BP goal <140/90 . Current regimen:  . Losartan 25 mg daily  . Carvedilol 3.125 mg twice daily  . Amlodipine 5 mg daily  . HCTZ 12.5 mg three times weekly . Patient self care activities - Over the next 90 days, patient will: o Check BP daily, document, and provide at future appointments o Take hydrochlorothiazide daily by timing with outdoor activities o Ensure daily salt intake < 2300 mg/day  Hyperlipidemia . Pharmacist Clinical Goal(s): o Over the next 90 days, patient will work with PharmD and providers to achieve LDL goal < 70 . Current regimen:  o Rosuvastatin 10 mg daily   Diabetes . Pharmacist Clinical Goal(s): o Over the next 90 days, patient will work with PharmD and providers to maintain A1c goal <7% . Current regimen:  o Victoza 1.2mg  injected daily . Patient self care activities - Over the next 90 days, patient will: o Check blood sugar once daily, document, and provide at future appointments o Contact provider with any episodes of hypoglycemia  Medication management . Pharmacist Clinical Goal(s): o Over the next 90 days, patient will work with PharmD and providers to achieve optimal medication adherence . Current pharmacy: [facility] . Interventions o Comprehensive medication review performed. o Corrections to medication list o Continue current medication management strategy . Patient self care activities - Over the  next 90 days, patient will: o Focus on medication adherence by taking medications as prescribed o Report any questions or concerns to PharmD and/or provider(s)       The patient verbalized understanding of instructions, educational materials, and care plan provided today and declined offer to receive copy of patient instructions, educational materials, and care plan.   Telephone follow up appointment with pharmacy team member scheduled for: 07/17/19 at 1:00 PM  Garey Ham Clinical Pharmacist [facility] [phone]

## 2020-02-06 ENCOUNTER — Ambulatory Visit: Payer: Medicare Other | Admitting: Nurse Practitioner

## 2020-02-06 ENCOUNTER — Encounter: Payer: Self-pay | Admitting: Nurse Practitioner

## 2020-02-06 ENCOUNTER — Other Ambulatory Visit: Payer: Self-pay

## 2020-02-06 VITALS — BP 130/82 | HR 58 | Ht 68.0 in | Wt 228.0 lb

## 2020-02-06 DIAGNOSIS — E782 Mixed hyperlipidemia: Secondary | ICD-10-CM | POA: Diagnosis not present

## 2020-02-06 DIAGNOSIS — I10 Essential (primary) hypertension: Secondary | ICD-10-CM | POA: Diagnosis not present

## 2020-02-06 DIAGNOSIS — I2510 Atherosclerotic heart disease of native coronary artery without angina pectoris: Secondary | ICD-10-CM

## 2020-02-06 DIAGNOSIS — I5032 Chronic diastolic (congestive) heart failure: Secondary | ICD-10-CM | POA: Diagnosis not present

## 2020-02-06 DIAGNOSIS — N183 Chronic kidney disease, stage 3 unspecified: Secondary | ICD-10-CM

## 2020-02-06 NOTE — Progress Notes (Signed)
Office Visit    Patient Name: Drew [name] Date of Encounter: 02/06/2020  Primary Care Provider:  Alba Cory, MD Primary Cardiologist:  Yvonne Kendall, MD  Chief Complaint    79 year old male with a history of heart failure and preserved ejection fraction, hypertension, hyperlipidemia, stage III chronic kidney disease, GERD, obesity, and coronary artery disease, who presents for follow-up after recent coronary CT angiography.  Past Medical History    Past Medical History:  Diagnosis Date  . (HFpEF) heart failure with preserved ejection fraction (HCC)    a. 02/2016 Echo: EF nl. Gr1 DD. Mild AI. Mod thickened AoV w/o stenosis. Nl RV size and fxn.  . Aortic atherosclerosis (HCC)   . CAD (coronary artery disease)    a. 12/2019 Cor CTA: LM nl, LAD 25-49p/m, LCX small, nondom, nl, RCA large, dom, 50-69p (nl FFR), 25-19m, Ca2+ = 1602 (79th%'ile)-->Med rx.  . Chronic kidney disease, stage III (moderate) (HCC) 08/15/2015  . Diabetes mellitus with complication (HCC)   . GERD (gastroesophageal reflux disease)   . H/O: upper GI bleed 2015  . Hiatal hernia    a. 12/2019 Moderate-sized HH incidentally noted on Cor CTA.  Marland Kitchen History of stress test    a. 05/2016 Lexiscan MV: EF 72%, No ischemia-->Low risk.  Marland Kitchen Hypercholesteremia   . Hypertension   . Obesity (BMI 30-39.9) 08/07/2015  . Peripheral vascular disease (HCC) 12/26/2015   Past Surgical History:  Procedure Laterality Date  . CATARACT EXTRACTION, BILATERAL Bilateral 11/18/2017  . COLONOSCOPY N/A 12/27/2013   Procedure: COLONOSCOPY;  Surgeon: Barrie Folk, MD;  Location: [facility] ENDOSCOPY;  Service: Endoscopy;  Laterality: N/A;  . ENTEROSCOPY N/A 12/21/2013   Procedure: ENTEROSCOPY;  Surgeon: Vertell Novak., MD;  Location: [facility] ENDOSCOPY;  Service: Endoscopy;  Laterality: N/A;  . ENTEROSCOPY N/A 12/27/2013   Procedure: ENTEROSCOPY;  Surgeon: Barrie Folk, MD;  Location: [facility] ENDOSCOPY;  Service: Endoscopy;  Laterality: N/A;     Allergies  Allergies  Allergen Reactions  . Niacin And Related Itching and Rash    History of Present Illness    79 year old male with the above past medical history including HFpEF, hypertension, hyperlipidemia, stage III chronic kidney disease, GERD, and obesity.  In the setting of chronic dyspnea, he has previously been evaluated with echocardiogram in January 2018 showing normal LV function and grade 1 diastolic dysfunction.  Mild AI was also noted.  She also underwent stress testing in April 2018, which was low risk.  He was last seen in clinic on November 10, at which time he reported chronic, stable exertional dyspnea but also reported new onset of occasional chest squeezing while walking around his property.  Coronary CT angiography was performed and showed mild LAD disease with moderate 50 to 69% proximal stenosis in a large, dominant right coronary artery.  Coronary calcium score was 1602, placing him in the 79th percentile.  CT FFR of the RCA did not show any significant stenosis.  A moderate sized hiatal hernia and aortic atherosclerosis were noted on noncardiac images.  Continued medical therapy was recommended and his statin dose was titrated to 10 mg daily with plan for follow-up labs in 3 months.  Since his last visit, his dyspnea has been relatively stable. He notes that he is not walking as frequently as he used to and believes some of his dyspnea is secondary to deconditioning. He denies any chest pain, palpitations, PND, orthopnea, dizziness, syncope, edema, or early satiety.  Home Medications  Prior to Admission medications   Medication Sig Start Date End Date Taking? Authorizing Provider  acetaminophen (TYLENOL) 500 MG tablet Take 500 mg by mouth every 6 (six) hours as needed.    [provider]  allopurinol (ZYLOPRIM) 100 MG tablet Take 100 mg by mouth 2 (two) times daily.    [provider]  amLODipine (NORVASC) 5 MG tablet TAKE 1 TABLET BY MOUTH   DAILY 07/02/19   End, Cristal Deer, MD  aspirin EC 81 MG tablet Take 81 mg by mouth daily.    [provider]  carvedilol (COREG) 3.125 MG tablet TAKE 1 TABLET BY MOUTH  TWICE DAILY 11/27/19   End, Cristal Deer, MD  Cholecalciferol (VITAMIN D3) 25 MCG (1000 UT) CAPS Take 1,000 Units by mouth daily.    [provider]  Coenzyme Q10 (CO Q 10) 100 MG CAPS Take 100 mg by mouth daily.    [provider]  famotidine (PEPCID) 20 MG tablet Take 1 tablet (20 mg total) by mouth daily. 11/27/19   Alba Cory, MD  hydrochlorothiazide (MICROZIDE) 12.5 MG capsule Take 1 capsule (12.5 mg total) by mouth 3 (three) times a week. 04/30/19   End, Cristal Deer, MD  levothyroxine (SYNTHROID) 25 MCG tablet TAKE 1 TABLET BY MOUTH IN  THE MORNING 11/30/19   Sowles, Danna Hefty, MD  liraglutide (VICTOZA) 18 MG/3ML SOPN Inject 0.2 mLs (1.2 mg total) into the skin daily. 09/03/19   Jamelle Haring, MD  losartan (COZAAR) 25 MG tablet TAKE 1 TABLET BY MOUTH  DAILY 12/13/19   End, Cristal Deer, MD  Multiple Vitamins-Minerals (CENTRUM SILVER PO) Take 200 mg by mouth daily.    [provider]  Needle, Disp, 32G X 5/16" MISC 6 mm by Does not apply route 2 (two) times daily. 11/06/19   Alba Cory, MD  Omega-3 Fatty Acids (FISH OIL) 1000 MG CAPS Take 1,000 mg by mouth daily.    [provider]  rosuvastatin (CRESTOR) 10 MG tablet Take 1 tablet (10 mg total) by mouth daily. 01/11/20 04/10/20  End, Cristal Deer, MD  Turmeric (QC TUMERIC COMPLEX) 500 MG CAPS Take 500 mg by mouth daily.    [provider]    Review of Systems    Dyspnea relatively stable. He believes he is deconditioned. Denies chest pain, palpitations, PND, orthopnea, dizziness, syncope, edema, or early satiety. All other systems reviewed and are otherwise negative except as noted above.  Physical Exam    VS:  BP 130/82 (BP Location: Left Arm, Patient Position: Sitting, Cuff Size: Normal)   Pulse (!) 58   Ht  5\' 8"  (1.727 m)   Wt 228 lb (103.4 kg)   SpO2 97%   BMI 34.67 kg/m  , BMI Body mass index is 34.67 kg/m. GEN: Well nourished, well developed, in no acute distress. HEENT: normal. Neck: Supple, no JVD, carotid bruits, or masses. Cardiac: RRR, no murmurs, rubs, or gallops. No clubbing, cyanosis, edema.  Radials/PT 2+ and equal bilaterally.  Respiratory:  Respirations regular and unlabored, clear to auscultation bilaterally. GI: Soft, nontender, nondistended, BS + x 4. MS: no deformity or atrophy. Skin: warm and dry, no rash. Neuro:  Strength and sensation are intact. Psych: Normal affect.  Accessory Clinical Findings    ECG personally reviewed by me today -sinus bradycardia, 58, left axis deviation- no acute changes.  Lab Results  Component Value Date   WBC 5.6 05/04/2019   HGB 15.3 05/04/2019   HCT 45.5 05/04/2019   MCV 96.2 05/04/2019   PLT 137 (  L) 05/04/2019   Lab Results  Component Value Date   CREATININE 1.32 (H) 01/08/2020   BUN 17 01/08/2020   NA 138 01/08/2020   K 4.1 01/08/2020   CL 104 01/08/2020   CO2 23 01/08/2020   Lab Results  Component Value Date   ALT 17 05/04/2019   AST 18 05/04/2019   ALKPHOS 48 02/09/2016   BILITOT 0.8 05/04/2019   Lab Results  Component Value Date   CHOL 146 05/04/2019   HDL 30 (L) 05/04/2019   LDLCALC 85 05/04/2019   TRIG 221 (H) 05/04/2019   CHOLHDL 4.9 05/04/2019    Lab Results  Component Value Date   HGBA1C 6.2 (A) 11/13/2019    Assessment & Plan    1. Coronary artery disease: Status post recent coronary CT angiography which revealed moderate disease in the dominant mid right coronary artery with mild nonobstructive disease in the proximal/mid LAD. FFR was normal in the RCA. Calcium score was elevated at 1602 placing him in the 79th percentile. Statin dose was increased from 5 to 10 mg daily and he will have repeat lipids prior to the end of the year. He has chronic, stable dyspnea on exertion and notes that he feels  as though he is deconditioned and not walking as much as he used to but does plan to begin again. He remains on aspirin, beta-blocker, ARB, and statin therapy.  2. Chronic heart failure with preserved ejection fraction: Normal LV function by echo in 2018. Euvolemic on examination today with stable heart rate and blood pressure. No changes.  3. Hyperlipidemia: Statin dose recently increased to 10 mg daily (rosuvastatin) in the setting of elevated coronary calcium and coronary atherosclerosis noted on CT imaging. So far he is tolerating this. He will plan to have follow-up lipids and LFTs prior to the end of this year.  4. Stage III chronic kidney disease: Creatinine 1.32 in November. He remains on ARB therapy.  6. Type 2 diabetes mellitus: A1c 6.2 in September. He remains on Victoza therapy. The setting of heart failure with preserved ejection fraction, he would potentially be a candidate for Jardiance if not cost prohibitive.  7. Disposition: Follow-up lipids and LFTs in about 2 weeks. Follow-up in clinic in 4 to 6 months.   Nicolasa Ducking, NP 02/06/2020, 1:55 PM

## 2020-02-06 NOTE — Patient Instructions (Signed)
Medication Instructions:  No changes  *If you need a refill on your cardiac medications before your next appointment, please call your pharmacy*   Lab Work: Labs to be done this year  If you have labs (blood work) drawn today and your tests are completely normal, you will receive your results only by: Marland Kitchen MyChart Message (if you have MyChart) OR . A paper copy in the mail If you have any lab test that is abnormal or we need to change your treatment, we will call you to review the results.   Testing/Procedures: None   Follow-Up: At [facility], you and your health needs are our priority.  As part of our continuing mission to provide you with exceptional heart care, we have created designated Provider Care Teams.  These Care Teams include your primary Cardiologist (physician) and Advanced Practice Providers (APPs -  Physician Assistants and Nurse Practitioners) who all work together to provide you with the care you need, when you need it.  We recommend signing up for the patient portal called "MyChart".  Sign up information is provided on this After Visit Summary.  MyChart is used to connect with patients for Virtual Visits (Telemedicine).  Patients are able to view lab/test results, encounter notes, upcoming appointments, etc.  Non-urgent messages can be sent to your provider as well.   To learn more about what you can do with MyChart, go to [email or web address].    Your next appointment:   6 month(s)  The format for your next appointment:   In Person  Provider:   Yvonne Kendall, MD or Nicolasa Ducking, NP

## 2020-02-11 NOTE — Progress Notes (Signed)
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 His weight is going down, but BMI is now below 35, A1C has gone from 6.2 % to 6.9 %  He states now using Victoza daily and denies hypoglycemic episodes, glucose at home has been within normal range . Denies polyphagia, polyuria or polydipsia.  He is on ARB and statin therapy   Thrombocytopenia: last platelet slightly low but stable , we will recheck labs next visit. Denies easy bleeding  OSA: he is complaint with CPAP, he has a new mask and no longer leaking He sleeps well through the night   HTN: bp is at goal, he has intermittent chest pain, on ARB  and beta blocker and norvasc. He denies dizziness, no orthopnea, but has intermittent chest pain and also sob with moderate activity  Recent CAP: went to urgent care and treated as outpatient, he states it was not COVID-19, he had to use ventolin for a few weeks but stopped since no longer wheezing   Dyslipidemia: discussed adding fish to diet, his last LDL was not at goal, he was seen by Dr. Okey Dupre recently and cardiac angiogram was abnormal, Dr. Okey Dupre adjusted dose of Crestor to 10 mg and he will have labs in a few months.   CHF :under the care of Dr. Okey Dupre, no orthopnea, wheezing he has sob with moderate activity, he is on beta-blocker and ARB, takes aspirin daily . Unchanged   Stable Angina: he has intermittent chest pain, under the care of cardiologist, he does not have NTG at home. 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 Thedore Mins, no pruritus, good urine output, last GFR stable   Hypothyroidism:  on the same dose of levothyroxine and last TSH at goal, weight is stable, denies dysphagia. Denies dry skin or change in bowel movements   History of gastric ulcer: denies blood in stools or melena, normal appetite, no epigastric pain   Patient Active Problem List   Diagnosis Date Noted  . Atrophy of kidney 10/10/2019  . Edema of lower extremity 10/10/2019  . Proteinuria 10/10/2019  . Thrombocytopenia (HCC) 08/13/2019  . Diabetes mellitus type 2 in obese (HCC) 08/13/2019  . Dyslipidemia associated with type 2 diabetes mellitus (HCC) 08/13/2019  . Neuropathy involving both lower extremities 05/04/2019  . Polyneuropathy, unspecified 05/04/2019  . Multiple renal cysts 04/09/2019  . Type II or unspecified type diabetes mellitus with renal manifestations, not stated as uncontrolled 04/09/2019  . Stable angina (HCC) 12/29/2018  . Chest pain 05/29/2018  . Onychomycosis of multiple toenails with type 2 diabetes mellitus (HCC) 11/30/2017  . Chronic heart failure with preserved ejection fraction (HFpEF) (HCC) 10/20/2017  . Heart failure, unspecified (HCC) 10/20/2017  . Diastolic dysfunction 02/10/2017  . Obstructive sleep apnea 02/10/2017  . Dyspnea on exertion 10/20/2016  . Chronic fatigue 10/20/2016  . Pain in both lower extremities 10/20/2016  . Arm pain, medial, right 08/27/2016  . Encounter for cardiac risk counseling 02/10/2016  . Light-headedness 11/26/2015  . Abnormal  ankle brachial index (ABI) 11/26/2015  . Stage 3a chronic kidney disease (HCC) 08/15/2015  . H/O: upper GI bleed   . Medication monitoring encounter 08/07/2015  . Morbid obesity (HCC) 08/07/2015  . Hypothyroidism due to acquired atrophy of thyroid 03/11/2015  . History of prostatitis 11/06/2014  . Essential hypertension 11/06/2014  . Mixed hyperlipidemia 11/06/2014  . 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 Flat feet, bilateral 11/06/2014  . History of anemia 11/06/2014  . Chronic  multiple gastric erosions 11/06/2014  . Scrotal swelling 11/06/2014  . 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 Chronic gout of ankle 08/16/2014  . H/O: GI bleed 12/20/2013  . Personal history of other diseases of the digestive system 12/20/2013    Past Surgical History:  Procedure Laterality Date  . CATARACT EXTRACTION, BILATERAL Bilateral 11/18/2017  . COLONOSCOPY N/A 12/27/2013   Procedure: COLONOSCOPY;  Surgeon: Barrie FolkJohn C Hayes, MD;  [facility];  Service: Endoscopy;  Laterality: N/A;  . ENTEROSCOPY N/A 12/21/2013   Procedure: ENTEROSCOPY;  Surgeon: Vertell NovakJames L Edwards Jr., MD;  [facility];  Service: Endoscopy;  Laterality: N/A;  . ENTEROSCOPY N/A 12/27/2013   Procedure: ENTEROSCOPY;  Surgeon: Barrie FolkJohn C Hayes, MD;  [facility];  Service: Endoscopy;  Laterality: N/A;    Family History  Problem Relation Age of Onset  . Hyperlipidemia Father   . Kidney disease Father   . Heart disease Father   . Congestive Heart Failure Mother   . Heart disease Mother   . Hyperlipidemia Daughter   . Hyperlipidemia Son   . Diabetes Son   . Anxiety disorder Daughter   . Depression Daughter   . Arthritis Daughter     Social History   Tobacco Use  . Smoking status: Former Smoker    Types: Cigarettes, Cigars    Quit date: 1990    Years since quitting: 31.9  . Smokeless tobacco: Never Used  . Tobacco comment: Smoked afternoon cigar  Substance Use Topics  . Alcohol use: No     Current Outpatient Medications:  .  acetaminophen (TYLENOL) 500 MG tablet, Take 500 mg by mouth every 6 (six) hours as needed., Disp: , Rfl:  .  albuterol (VENTOLIN HFA) 108 (90 Base) MCG/ACT inhaler, Inhale 1-2 puffs into the lungs every 6 (six) hours as needed., Disp: , Rfl:  .  allopurinol (ZYLOPRIM) 100 MG tablet, Take 100 mg by mouth 2 (two) times daily., Disp: , Rfl:  .  amLODipine (NORVASC) 5 MG tablet, TAKE 1 TABLET BY MOUTH  DAILY, Disp: 90  tablet, Rfl: 2 .  aspirin EC 81 MG tablet, Take 81 mg by mouth daily., Disp: , Rfl:  .  carvedilol (COREG) 3.125 MG tablet, TAKE 1 TABLET BY MOUTH  TWICE DAILY, Disp: 180 tablet, Rfl: 0 .  Cholecalciferol (VITAMIN D3) 25 MCG (1000 UT) CAPS, Take 1,000 Units by mouth daily., Disp: , Rfl:  .  Coenzyme Q10 (CO Q 10) 100 MG CAPS, Take 100 mg by mouth daily., Disp: , Rfl:  .  famotidine (PEPCID) 20 MG tablet, Take 1 tablet (20 mg total) by mouth daily., Disp: 90 tablet, Rfl: 0 .  hydrochlorothiazide (MICROZIDE) 12.5 MG capsule, Take 1 capsule (12.5 mg total) by mouth 3 (three) times a week., Disp: 36 capsule, Rfl: 3 .  levothyroxine (SYNTHROID) 25 MCG tablet, TAKE 1 TABLET BY MOUTH IN  THE MORNING, Disp: 90 tablet, Rfl: 1 .  liraglutide (VICTOZA) 18 MG/3ML  SOPN, Inject 0.2 mLs (1.2 mg total) into the skin daily., Disp: 6 mL, Rfl: 3 .  losartan (COZAAR) 25 MG tablet, TAKE 1 TABLET BY MOUTH  DAILY, Disp: 90 tablet, Rfl: 0 .  Multiple Vitamins-Minerals (CENTRUM SILVER PO), Take 200 mg by mouth daily., Disp: , Rfl:  .  Needle, Disp, 32G X 5/16" MISC, 6 mm by Does not apply route 2 (two) times daily., Disp: 30 each, Rfl: 2 .  Omega-3 Fatty Acids (FISH OIL) 1000 MG CAPS, Take 1,000 mg by mouth daily., Disp: , Rfl:  .  rosuvastatin (CRESTOR) 10 MG tablet, Take 1 tablet (10 mg total) by mouth daily., Disp: 90 tablet, Rfl: 2 .  Turmeric 500 MG CAPS, Take 500 mg by mouth daily., Disp: , Rfl:   Allergies  Allergen Reactions  . Niacin And Related Itching and Rash    I personally reviewed active problem list, medication list, allergies, family history, social history, health maintenance with the patient/caregiver today.   ROS  Constitutional: Negative for fever or weight change.  Respiratory: Negative for cough , positive for  shortness of breath.   Cardiovascular: Negative for chest pain or palpitations.  Gastrointestinal: Negative for abdominal pain, no bowel changes.  Musculoskeletal: Negative for  gait problem or joint swelling.  Skin: Negative for rash.  Neurological: Negative for dizziness or headache.  No other specific complaints in a complete review of systems (except as listed in HPI above).  Objective  Vitals:   02/12/20 0839  BP: 138/86  Pulse: 63  Resp: 16  Temp: 98.1 F (36.7 C)  TempSrc: Oral  SpO2: 97%  Weight: 227 lb 14.4 oz (103.4 kg)  Height: 5\' 8"  (1.727 m)    Body mass index is 34.65 kg/m.  Physical Exam  Constitutional: Patient appears well-developed and well-nourished. Obese  No distress.  HEENT: head atraumatic, normocephalic, pupils equal and reactive to light, neck supple Cardiovascular: Normal rate, regular rhythm and normal heart sounds.  No murmur heard. No BLE edema. Pulmonary/Chest: Effort normal and breath sounds normal. No respiratory distress. Abdominal: Soft.  There is no tenderness. Psychiatric: Patient has a normal mood and affect. behavior is normal. Judgment and thought content normal.  Recent Results (from the past 2160 hour(s))  Basic metabolic panel     Status: Abnormal   Collection Time: 01/08/20  1:56 PM  Result Value Ref Range   Sodium 138 135 - 145 mmol/L   Potassium 4.1 3.5 - 5.1 mmol/L   Chloride 104 98 - 111 mmol/L   CO2 23 22 - 32 mmol/L   Glucose, Bld 135 (H) 70 - 99 mg/dL    Comment: Glucose reference range applies only to samples taken after fasting for at least 8 hours.   BUN 17 8 - 23 mg/dL   Creatinine, Ser 01/10/20 (H) 0.61 - 1.24 mg/dL   Calcium 9.6 8.9 - 1.74 mg/dL   GFR, Estimated 55 (L) >60 mL/min    Comment: (NOTE) Calculated using the CKD-EPI Creatinine Equation (2021)    Anion gap 11 5 - 15    Comment: Performed at [facility], [address] [place], [place]  POCT HgB A1C     Status: Abnormal   Collection Time: 02/12/20  8:50 AM  Result Value Ref Range   Hemoglobin A1C 6.9 (A) 4.0 - 5.6 %   HbA1c POC (<> result, manual entry)     HbA1c, POC (prediabetic range)     HbA1c, POC  (controlled diabetic range)  PHQ2/9: Depression screen [facility] 2/9 02/12/2020 11/13/2019 08/13/2019 05/04/2019 03/01/2019  Decreased Interest 0 0 1 0 0  Down, Depressed, Hopeless 0 0 1 0 0  PHQ - 2 Score 0 0 2 0 0  Altered sleeping - - 1 0 -  Tired, decreased energy - - 1 0 -  Change in appetite - - 0 0 -  Feeling bad or failure about yourself  - - 0 0 -  Trouble concentrating - - 1 0 -  Moving slowly or fidgety/restless - - 0 0 -  Suicidal thoughts - - 0 0 -  PHQ-9 Score - - 5 0 -  Difficult doing work/chores - - Somewhat difficult Not difficult at all -  Some recent data might be hidden    phq 9 is negative   Fall Risk: Fall Risk  02/12/2020 11/13/2019 08/13/2019 05/04/2019 03/01/2019  Falls in the past year? 1 1 1 1 1   Number falls in past yr: 1 0 0 1 1  Comment - - - - -  Injury with Fall? 0 1 1 1 1   Comment - - Knee injury - -  Risk for fall due to : - History of fall(s) History of fall(s) - -  Follow up - - - - Falls prevention discussed    Functional Status Survey: Is the patient deaf or have difficulty hearing?: No Does the patient have difficulty seeing, even when wearing glasses/contacts?: No Does the patient have difficulty concentrating, remembering, or making decisions?: No Does the patient have difficulty walking or climbing stairs?: No Does the patient have difficulty dressing or bathing?: No Does the patient have difficulty doing errands alone such as visiting a doctor's office or shopping?: No    Assessment & Plan  1. Diabetes mellitus type 2 in obese (HCC)  - POCT HgB A1C - liraglutide (VICTOZA) 18 MG/3ML SOPN; Inject 1.2 mg into the skin daily.  Dispense: 27 mL; Refill: 1 - Needle, Disp, 32G X 5/16" MISC; 6 mm by Does not apply route 2 (two) times daily.  Dispense: 100 each; Refill: 3  2. Benign hypertension with chronic kidney disease, stage III (HCC)   3. Essential hypertension  At goal   4. Obesity (BMI 30.0-34.9)  Discussed with the patient  the risk posed by an increased BMI. Discussed importance of portion control, calorie counting and at least 150 minutes of physical activity weekly. Avoid sweet beverages and drink more water. Eat at least 6 servings of fruit and vegetables daily   5. Stage 3a chronic kidney disease (HCC)   6. Stable angina (HCC)   7. Chronic heart failure with preserved ejection fraction (HCC)   8. OSA on CPAP   9. Thrombocytopenia (HCC)   10. Hypothyroidism due to acquired atrophy of thyroid   11. Neuropathy involving both lower extremities   12. Idiopathic chronic gout of right knee without tophus   13. Dyslipidemia (high LDL; low HDL)   14. Dyslipidemia associated with type 2 diabetes mellitus (HCC)   15. History of gastric ulcer  - famotidine (PEPCID) 20 MG tablet; Take 1 tablet (20 mg total) by mouth daily.  Dispense: 90 tablet; Refill: 1r.

## 2020-02-12 ENCOUNTER — Ambulatory Visit (INDEPENDENT_AMBULATORY_CARE_PROVIDER_SITE_OTHER): Payer: Medicare Other | Admitting: Family Medicine

## 2020-02-12 ENCOUNTER — Encounter: Payer: Self-pay | Admitting: Family Medicine

## 2020-02-12 ENCOUNTER — Other Ambulatory Visit: Payer: Self-pay

## 2020-02-12 ENCOUNTER — Other Ambulatory Visit: Payer: Self-pay | Admitting: Internal Medicine

## 2020-02-12 VITALS — BP 138/86 | HR 63 | Temp 98.1°F | Resp 16 | Ht 68.0 in | Wt 227.9 lb

## 2020-02-12 DIAGNOSIS — E669 Obesity, unspecified: Secondary | ICD-10-CM | POA: Diagnosis not present

## 2020-02-12 DIAGNOSIS — M1A0610 Idiopathic chronic gout, right knee, without tophus (tophi): Secondary | ICD-10-CM

## 2020-02-12 DIAGNOSIS — I129 Hypertensive chronic kidney disease with stage 1 through stage 4 chronic kidney disease, or unspecified chronic kidney disease: Secondary | ICD-10-CM | POA: Diagnosis not present

## 2020-02-12 DIAGNOSIS — N1831 Chronic kidney disease, stage 3a: Secondary | ICD-10-CM

## 2020-02-12 DIAGNOSIS — E785 Hyperlipidemia, unspecified: Secondary | ICD-10-CM

## 2020-02-12 DIAGNOSIS — E034 Atrophy of thyroid (acquired): Secondary | ICD-10-CM

## 2020-02-12 DIAGNOSIS — I208 Other forms of angina pectoris: Secondary | ICD-10-CM

## 2020-02-12 DIAGNOSIS — I5032 Chronic diastolic (congestive) heart failure: Secondary | ICD-10-CM

## 2020-02-12 DIAGNOSIS — D696 Thrombocytopenia, unspecified: Secondary | ICD-10-CM

## 2020-02-12 DIAGNOSIS — G4733 Obstructive sleep apnea (adult) (pediatric): Secondary | ICD-10-CM

## 2020-02-12 DIAGNOSIS — N183 Chronic kidney disease, stage 3 unspecified: Secondary | ICD-10-CM

## 2020-02-12 DIAGNOSIS — Z9989 Dependence on other enabling machines and devices: Secondary | ICD-10-CM

## 2020-02-12 DIAGNOSIS — Z8711 Personal history of peptic ulcer disease: Secondary | ICD-10-CM

## 2020-02-12 DIAGNOSIS — E1169 Type 2 diabetes mellitus with other specified complication: Secondary | ICD-10-CM

## 2020-02-12 DIAGNOSIS — I10 Essential (primary) hypertension: Secondary | ICD-10-CM | POA: Diagnosis not present

## 2020-02-12 DIAGNOSIS — G5793 Unspecified mononeuropathy of bilateral lower limbs: Secondary | ICD-10-CM

## 2020-02-12 LAB — POCT GLYCOSYLATED HEMOGLOBIN (HGB A1C): Hemoglobin A1C: 6.9 % — AB (ref 4.0–5.6)

## 2020-02-12 MED ORDER — LIRAGLUTIDE 18 MG/3ML ~~LOC~~ SOPN: 1.2000 mg | PEN_INJECTOR | Freq: Every day | SUBCUTANEOUS | 1 refills | Status: DC

## 2020-02-12 MED ORDER — "NEEDLE (DISP) 32G X 5/16"" MISC": 6.0000 mm | Freq: Two times a day (BID) | 3 refills | Status: DC

## 2020-02-12 MED ORDER — FAMOTIDINE 20 MG PO TABS: 20.0000 mg | ORAL_TABLET | Freq: Every day | ORAL | 1 refills | Status: DC

## 2020-02-14 ENCOUNTER — Other Ambulatory Visit: Payer: Self-pay | Admitting: Internal Medicine

## 2020-02-19 ENCOUNTER — Other Ambulatory Visit: Payer: Self-pay | Admitting: Internal Medicine

## 2020-02-19 ENCOUNTER — Other Ambulatory Visit
Admission: RE | Admit: 2020-02-19 | Discharge: 2020-02-19 | Disposition: A | Discharge status: Home / Self Care | Payer: Medicare Other | Attending: Internal Medicine | Admitting: Internal Medicine

## 2020-02-19 ENCOUNTER — Other Ambulatory Visit: Payer: Self-pay

## 2020-02-19 DIAGNOSIS — I5032 Chronic diastolic (congestive) heart failure: Secondary | ICD-10-CM

## 2020-02-19 DIAGNOSIS — Z79899 Other long term (current) drug therapy: Secondary | ICD-10-CM | POA: Insufficient documentation

## 2020-02-19 DIAGNOSIS — E782 Mixed hyperlipidemia: Secondary | ICD-10-CM

## 2020-02-19 LAB — LIPID PANEL
Cholesterol: 157 mg/dL (ref 0–200)
HDL: 45 mg/dL (ref >40)
LDL Cholesterol: 88 mg/dL (ref 0–99)
Total CHOL/HDL Ratio: 3.5 RATIO
Triglycerides: 120 mg/dL (ref <150)
VLDL: 24 mg/dL (ref 0–40)

## 2020-02-19 LAB — ALT: ALT: 44 U/L (ref 0–44)

## 2020-02-20 ENCOUNTER — Encounter: Payer: Self-pay | Admitting: *Deleted

## 2020-02-20 DIAGNOSIS — I2510 Atherosclerotic heart disease of native coronary artery without angina pectoris: Secondary | ICD-10-CM

## 2020-02-20 MED ORDER — ROSUVASTATIN CALCIUM 20 MG PO TABS: 20.0000 mg | ORAL_TABLET | Freq: Every day | ORAL | 3 refills | Status: DC

## 2020-03-04 ENCOUNTER — Ambulatory Visit: Payer: Medicare Other

## 2020-03-17 ENCOUNTER — Telehealth: Payer: Self-pay

## 2020-03-17 NOTE — Progress Notes (Signed)
Chronic Care Management Pharmacy Assistant   Name: Drew [name]  MRN: [id number] DOB: 09-12-1940  Reason for Encounter: Medication Review/Patient assistance for Victoza.   PCP : Drew Cory, MD  Allergies:   Allergies  Allergen Reactions  . Niacin And Related Itching and Rash    Medications: Outpatient Encounter Medications as of 03/17/2020  Medication Sig Note  . acetaminophen (TYLENOL) 500 MG tablet Take 500 mg by mouth every 6 (six) hours as needed.   Marland Kitchen allopurinol (ZYLOPRIM) 100 MG tablet Take 100 mg by mouth 2 (two) times daily. 01/15/2020: Prescribed by Dr. Mosetta Pigeon  . amLODipine (NORVASC) 5 MG tablet TAKE 1 TABLET BY MOUTH  DAILY   . aspirin EC 81 MG tablet Take 81 mg by mouth daily.   . carvedilol (COREG) 3.125 MG tablet TAKE 1 TABLET BY MOUTH  TWICE DAILY   . Cholecalciferol (VITAMIN D3) 25 MCG (1000 UT) CAPS Take 1,000 Units by mouth daily.   . Coenzyme Q10 (CO Q 10) 100 MG CAPS Take 100 mg by mouth daily.   . famotidine (PEPCID) 20 MG tablet Take 1 tablet (20 mg total) by mouth daily.   . hydrochlorothiazide (MICROZIDE) 12.5 MG capsule TAKE 1 CAPSULE BY MOUTH 3  TIMES WEEKLY   . levothyroxine (SYNTHROID) 25 MCG tablet TAKE 1 TABLET BY MOUTH IN  THE MORNING   . liraglutide (VICTOZA) 18 MG/3ML SOPN Inject 1.2 mg into the skin daily.   Marland Kitchen losartan (COZAAR) 25 MG tablet TAKE 1 TABLET BY MOUTH  DAILY   . Multiple Vitamins-Minerals (CENTRUM SILVER PO) Take 200 mg by mouth daily.   . Needle, Disp, 32G X 5/16" MISC 6 mm by Does not apply route 2 (two) times daily.   . Omega-3 Fatty Acids (FISH OIL) 1000 MG CAPS Take 1,000 mg by mouth daily.   . rosuvastatin (CRESTOR) 20 MG tablet Take 1 tablet (20 mg total) by mouth daily.   . Turmeric 500 MG CAPS Take 500 mg by mouth daily.    No facility-administered encounter medications on file as of 03/17/2020.    Current Diagnosis: Patient Active Problem List   Diagnosis Date Noted  . Atrophy of kidney 10/10/2019  .  Edema of lower extremity 10/10/2019  . Proteinuria 10/10/2019  . Thrombocytopenia (HCC) 08/13/2019  . Diabetes mellitus type 2 in obese (HCC) 08/13/2019  . Dyslipidemia associated with type 2 diabetes mellitus (HCC) 08/13/2019  . Neuropathy involving both lower extremities 05/04/2019  . Polyneuropathy, unspecified 05/04/2019  . Multiple renal cysts 04/09/2019  . Type II or unspecified type diabetes mellitus with renal manifestations, not stated as uncontrolled 04/09/2019  . Stable angina (HCC) 12/29/2018  . Chest pain 05/29/2018  . Onychomycosis of multiple toenails with type 2 diabetes mellitus (HCC) 11/30/2017  . Chronic heart failure with preserved ejection fraction (HFpEF) (HCC) 10/20/2017  . Heart failure, unspecified (HCC) 10/20/2017  . Diastolic dysfunction 02/10/2017  . Obstructive sleep apnea 02/10/2017  . Dyspnea on exertion 10/20/2016  . Chronic fatigue 10/20/2016  . Pain in both lower extremities 10/20/2016  . Arm pain, medial, right 08/27/2016  . Encounter for cardiac risk counseling 02/10/2016  . Light-headedness 11/26/2015  . Abnormal ankle brachial index (ABI) 11/26/2015  . Stage 3a chronic kidney disease (HCC) 08/15/2015  . H/O: upper GI bleed   . Medication monitoring encounter 08/07/2015  . Morbid obesity (HCC) 08/07/2015  . Hypothyroidism due to acquired atrophy of thyroid 03/11/2015  . History of prostatitis 11/06/2014  . Essential hypertension 11/06/2014  .  Mixed hyperlipidemia 11/06/2014  . 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 Flat feet, bilateral 11/06/2014  . History of anemia 11/06/2014  . Chronic multiple gastric erosions 11/06/2014  . Scrotal swelling 11/06/2014  . 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 Chronic gout of ankle 08/16/2014  . H/O: GI bleed 12/20/2013  . Personal history of other diseases of the digestive system 12/20/2013    Goals Addressed   None     Spoke to patient to inform him that we are sending her a Patient assistance form  for Victoza  by mail.Informed patient to include a copy of his proof of income AND a copy of his Explanation of Benefits (EOB) statement from her insurance.Advised patient to return the PAP forms back to the [place] office.  Everlean Cherry Clinical Pharmacist Assistant [phone]    Follow-Up:  Patient Assistance Coordination

## 2020-03-17 NOTE — Progress Notes (Signed)
Chronic Care Management Pharmacy Assistant   Name: Drew [name]  MRN: [id number] DOB: 01/25/1941  Reason for Encounter:Diabetes  Disease State Call   Patient Questions:  1.  Have you seen any other providers since your last visit? Yes, 02/12/2020 PCP Alba Cory  2.  Any changes in your medicines or health? No    PCP : Alba Cory, MD  Allergies:   Allergies  Allergen Reactions  . Niacin And Related Itching and Rash    Medications: Outpatient Encounter Medications as of 03/17/2020  Medication Sig Note  . acetaminophen (TYLENOL) 500 MG tablet Take 500 mg by mouth every 6 (six) hours as needed.   Marland Kitchen allopurinol (ZYLOPRIM) 100 MG tablet Take 100 mg by mouth 2 (two) times daily. 01/15/2020: Prescribed by Dr. Mosetta Pigeon  . amLODipine (NORVASC) 5 MG tablet TAKE 1 TABLET BY MOUTH  DAILY   . aspirin EC 81 MG tablet Take 81 mg by mouth daily.   . carvedilol (COREG) 3.125 MG tablet TAKE 1 TABLET BY MOUTH  TWICE DAILY   . Cholecalciferol (VITAMIN D3) 25 MCG (1000 UT) CAPS Take 1,000 Units by mouth daily.   . Coenzyme Q10 (CO Q 10) 100 MG CAPS Take 100 mg by mouth daily.   . famotidine (PEPCID) 20 MG tablet Take 1 tablet (20 mg total) by mouth daily.   . hydrochlorothiazide (MICROZIDE) 12.5 MG capsule TAKE 1 CAPSULE BY MOUTH 3  TIMES WEEKLY   . levothyroxine (SYNTHROID) 25 MCG tablet TAKE 1 TABLET BY MOUTH IN  THE MORNING   . liraglutide (VICTOZA) 18 MG/3ML SOPN Inject 1.2 mg into the skin daily.   Marland Kitchen losartan (COZAAR) 25 MG tablet TAKE 1 TABLET BY MOUTH  DAILY   . Multiple Vitamins-Minerals (CENTRUM SILVER PO) Take 200 mg by mouth daily.   . Needle, Disp, 32G X 5/16" MISC 6 mm by Does not apply route 2 (two) times daily.   . Omega-3 Fatty Acids (FISH OIL) 1000 MG CAPS Take 1,000 mg by mouth daily.   . rosuvastatin (CRESTOR) 20 MG tablet Take 1 tablet (20 mg total) by mouth daily.   . Turmeric 500 MG CAPS Take 500 mg by mouth daily.    No facility-administered encounter  medications on file as of 03/17/2020.    Current Diagnosis: Patient Active Problem List   Diagnosis Date Noted  . Atrophy of kidney 10/10/2019  . Edema of lower extremity 10/10/2019  . Proteinuria 10/10/2019  . Thrombocytopenia (HCC) 08/13/2019  . Diabetes mellitus type 2 in obese (HCC) 08/13/2019  . Dyslipidemia associated with type 2 diabetes mellitus (HCC) 08/13/2019  . Neuropathy involving both lower extremities 05/04/2019  . Polyneuropathy, unspecified 05/04/2019  . Multiple renal cysts 04/09/2019  . Type II or unspecified type diabetes mellitus with renal manifestations, not stated as uncontrolled 04/09/2019  . Stable angina (HCC) 12/29/2018  . Chest pain 05/29/2018  . Onychomycosis of multiple toenails with type 2 diabetes mellitus (HCC) 11/30/2017  . Chronic heart failure with preserved ejection fraction (HFpEF) (HCC) 10/20/2017  . Heart failure, unspecified (HCC) 10/20/2017  . Diastolic dysfunction 02/10/2017  . Obstructive sleep apnea 02/10/2017  . Dyspnea on exertion 10/20/2016  . Chronic fatigue 10/20/2016  . Pain in both lower extremities 10/20/2016  . Arm pain, medial, right 08/27/2016  . Encounter for cardiac risk counseling 02/10/2016  . Light-headedness 11/26/2015  . Abnormal ankle brachial index (ABI) 11/26/2015  . Stage 3a chronic kidney disease (HCC) 08/15/2015  . H/O: upper GI bleed   .  Medication monitoring encounter 08/07/2015  . Morbid obesity (HCC) 08/07/2015  . Hypothyroidism due to acquired atrophy of thyroid 03/11/2015  . History of prostatitis 11/06/2014  . Essential hypertension 11/06/2014  . Mixed hyperlipidemia 11/06/2014  . 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 Flat feet, bilateral 11/06/2014  . History of anemia 11/06/2014  . Chronic multiple gastric erosions 11/06/2014  . Scrotal swelling 11/06/2014  . 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 Chronic gout of ankle  08/16/2014  . H/O: GI bleed 12/20/2013  . Personal history of other diseases of the digestive system 12/20/2013    Goals Addressed   None    Recent Relevant Labs: Lab Results  Component Value Date/Time   HGBA1C 6.9 (A) 02/12/2020 08:50 AM   HGBA1C 6.2 (A) 11/13/2019 09:54 AM   HGBA1C 6.2 (H) 05/04/2019 10:02 AM   HGBA1C 5.9 (H) 08/02/2018 11:14 AM   MICROALBUR 2.1 08/02/2018 11:14 AM   MICROALBUR 3.3 05/31/2017 09:10 AM    Kidney Function Lab Results  Component Value Date/Time   CREATININE 1.32 (H) 01/08/2020 01:56 PM   CREATININE 1.22 (H) 05/04/2019 10:02 AM   CREATININE 1.30 (H) 10/23/2018 03:01 PM   CREATININE 1.34 (H) 08/02/2018 11:14 AM   GFRNONAA 55 (L) 01/08/2020 01:56 PM   GFRNONAA 56 (L) 05/04/2019 10:02 AM   GFRAA 65 05/04/2019 10:02 AM    . Current antihyperglycemic regimen:  ? Victoza 1.2mg  injected daily . What recent interventions/DTPs have been made to improve glycemic control:  o None ID . Have there been any recent hospitalizations or ED visits since last visit with CPP? No . Patient reports hypoglycemic symptoms, including Nervous/irritable . Patient reports hyperglycemic symptoms, including weakness  o Patient states he gets dizzy when he bends over or gets up real fast, "only last about one minute". . How often are you checking your blood sugar? twice daily . What are your blood sugars ranging?  o Fasting: N/A o Before meals: -  On 03/16/2020 it was 122,149. - On 03/15/2020 it was 117,121. o After meals: N/A o Bedtime: N/A . During the week, how often does your blood glucose drop below 70? Never  . Are you checking your feet daily/regularly?  o Patient denies numbness, pain or tingling sensation in his feet.  Patient states his Victoza is going to cost him $585.00. Patient states he is still waiting to hear about his patient assistance because he can not afford $585.00.Patient reports he was approved in the past for patient assistance.     Adherence Review: Is the patient currently on a STATIN medication? Yes Is the patient currently on ACE/ARB medication? Yes Does the patient have >5 day gap between last estimated fill dates? Yes   Lottie Mussel  Follow-Up:  Pharmacist Review   Everlean Cherry Clinical Pharmacist Assistant [phone]

## 2020-03-18 ENCOUNTER — Telehealth: Payer: Self-pay

## 2020-03-18 NOTE — Telephone Encounter (Signed)
-----   Message from Alba Cory, MD sent at 03/18/2020  1:25 PM EST ----- Regarding: FW: Samples Do we have ozempic samples?   ----- Message ----- From: Gaspar Cola, [facility] Sent: 03/18/2020   9:59 AM EST To: Alba Cory, MD Subject: Samples                                        Dr. Carlynn Purl,  Patient reports his Victoza is unaffordable for him. We are working to get him approved with patient assistance as soon as possible. In the meantime, would it be ok if we provided him samples of Ozempic to help him until we can get it approved?   Thanks, Garey Ham Clinical Pharmacist [facility] [phone]

## 2020-03-18 NOTE — Telephone Encounter (Signed)
Called and lvm that Dr Carlynn Purl is giving him samples of Ozempic and they are ready for pickup at the office.

## 2020-03-25 ENCOUNTER — Other Ambulatory Visit: Payer: Medicare Other

## 2020-03-25 DIAGNOSIS — Z20822 Contact with and (suspected) exposure to covid-19: Secondary | ICD-10-CM

## 2020-03-26 LAB — NOVEL CORONAVIRUS, NAA: SARS-CoV-2, NAA: DETECTED — AB

## 2020-03-26 LAB — SARS-COV-2, NAA 2 DAY TAT

## 2020-04-02 DIAGNOSIS — G4733 Obstructive sleep apnea (adult) (pediatric): Secondary | ICD-10-CM | POA: Diagnosis not present

## 2020-04-02 DIAGNOSIS — I10 Essential (primary) hypertension: Secondary | ICD-10-CM | POA: Diagnosis not present

## 2020-04-07 DIAGNOSIS — N1831 Chronic kidney disease, stage 3a: Secondary | ICD-10-CM | POA: Diagnosis not present

## 2020-04-07 DIAGNOSIS — E1129 Type 2 diabetes mellitus with other diabetic kidney complication: Secondary | ICD-10-CM | POA: Diagnosis not present

## 2020-04-07 DIAGNOSIS — I10 Essential (primary) hypertension: Secondary | ICD-10-CM | POA: Diagnosis not present

## 2020-04-07 DIAGNOSIS — N281 Cyst of kidney, acquired: Secondary | ICD-10-CM | POA: Diagnosis not present

## 2020-04-08 ENCOUNTER — Other Ambulatory Visit: Payer: Self-pay

## 2020-04-08 MED ORDER — ROSUVASTATIN CALCIUM 20 MG PO TABS: 20.0000 mg | ORAL_TABLET | Freq: Every day | ORAL | 1 refills | Status: DC

## 2020-04-15 ENCOUNTER — Ambulatory Visit (INDEPENDENT_AMBULATORY_CARE_PROVIDER_SITE_OTHER): Payer: Medicare Other

## 2020-04-15 ENCOUNTER — Ambulatory Visit: Payer: Medicare Other | Admitting: Internal Medicine

## 2020-04-15 DIAGNOSIS — Z0000 Encounter for general adult medical examination without abnormal findings: Secondary | ICD-10-CM | POA: Diagnosis not present

## 2020-04-15 NOTE — Progress Notes (Signed)
Subjective:   Drew [name] is a 80 y.o. male who presents for Medicare Annual/Subsequent preventive examination.  Virtual Visit via Telephone Note  I connected with  Drew [name] on 04/15/20 at  2:10 PM EST by telephone and verified that I am speaking with the correct person using two identifiers.  Location: Patient: home Provider: [facility] Persons participating in the virtual visit: patient/Nurse Health Advisor   I discussed the limitations, risks, security and privacy concerns of performing an evaluation and management service by telephone and the availability of in person appointments. The patient expressed understanding and agreed to proceed.  Interactive audio and video telecommunications were attempted between this nurse and patient, however failed, due to patient having technical difficulties OR patient did not have access to video capability.  We continued and completed visit with audio only.  Some vital signs may be absent or patient reported.   Reather Littler, LPN    Review of Systems     Cardiac Risk Factors include: advanced age (>64men, >62 women);diabetes mellitus;hypertension;dyslipidemia;male gender     Objective:    There were no vitals filed for this visit. There is no height or weight on file to calculate BMI.  Advanced Directives 04/15/2020 03/01/2019 01/27/2018 11/30/2016 08/27/2016 02/09/2016 12/31/2015  Does Patient Have a Medical Advance Directive? No Yes No No No Yes No  Type of Advance Directive - Healthcare Power of [place];Living will - - - - -  Copy of Healthcare Power of Attorney in Chart? - No - copy requested - - - - -  Would patient like information on creating a medical advance directive? Yes (MAU/Ambulatory/Procedural Areas - Information given) - Yes (MAU/Ambulatory/Procedural Areas - Information given) - - - -    Current Medications (verified) Outpatient Encounter Medications as of 04/15/2020  Medication Sig  . acetaminophen (TYLENOL) 500 MG tablet  Take 500 mg by mouth every 6 (six) hours as needed.  Marland Kitchen allopurinol (ZYLOPRIM) 100 MG tablet Take 100 mg by mouth 2 (two) times daily.  Marland Kitchen amLODipine (NORVASC) 5 MG tablet TAKE 1 TABLET BY MOUTH  DAILY  . Apoaequorin (PREVAGEN PO) Take by mouth.  . Ascorbic Acid (VITAMIN C) 1000 MG tablet Take 1,000 mg by mouth daily.  Marland Kitchen aspirin EC 81 MG tablet Take 81 mg by mouth daily.  . carvedilol (COREG) 3.125 MG tablet TAKE 1 TABLET BY MOUTH  TWICE DAILY  . Cholecalciferol (VITAMIN D3) 25 MCG (1000 UT) CAPS Take 1,000 Units by mouth daily.  . Coenzyme Q10 (CO Q 10) 100 MG CAPS Take 100 mg by mouth daily.  . famotidine (PEPCID) 20 MG tablet Take 1 tablet (20 mg total) by mouth daily.  . hydrochlorothiazide (MICROZIDE) 12.5 MG capsule TAKE 1 CAPSULE BY MOUTH 3  TIMES WEEKLY  . levothyroxine (SYNTHROID) 25 MCG tablet TAKE 1 TABLET BY MOUTH IN  THE MORNING  . liraglutide (VICTOZA) 18 MG/3ML SOPN Inject 1.2 mg into the skin daily.  Marland Kitchen losartan (COZAAR) 25 MG tablet TAKE 1 TABLET BY MOUTH  DAILY  . Multiple Vitamins-Minerals (CENTRUM SILVER PO) Take 200 mg by mouth daily.  . Needle, Disp, 32G X 5/16" MISC 6 mm by Does not apply route 2 (two) times daily.  . Omega-3 Fatty Acids (FISH OIL) 1000 MG CAPS Take 1,000 mg by mouth daily.  . rosuvastatin (CRESTOR) 20 MG tablet Take 1 tablet (20 mg total) by mouth daily.  . Turmeric 500 MG CAPS Take 500 mg by mouth daily.   No facility-administered encounter medications  on file as of 04/15/2020.    Allergies (verified) Niacin and related   History: Past Medical History:  Diagnosis Date  . (HFpEF) heart failure with preserved ejection fraction (HCC)    a. 02/2016 Echo: EF nl. Gr1 DD. Mild AI. Mod thickened AoV w/o stenosis. Nl RV size and fxn.  . Aortic atherosclerosis (HCC)   . CAD (coronary artery disease)    a. 12/2019 Cor CTA: LM nl, LAD 25-49p/m, LCX small, nondom, nl, RCA large, dom, 50-69p (nl FFR), 25-37m, Ca2+ = 1602 (79th%'ile)-->Med rx.  . Chronic kidney  disease, stage III (moderate) (HCC) 08/15/2015  . Diabetes mellitus with complication (HCC)   . GERD (gastroesophageal reflux disease)   . H/O: upper GI bleed 2015  . Hiatal hernia    a. 12/2019 Moderate-sized HH incidentally noted on Cor CTA.  Marland Kitchen History of stress test    a. 05/2016 Lexiscan MV: EF 72%, No ischemia-->Low risk.  Marland Kitchen Hypercholesteremia   . Hypertension   . Obesity (BMI 30-39.9) 08/07/2015  . Peripheral vascular disease (HCC) 12/26/2015   Past Surgical History:  Procedure Laterality Date  . CATARACT EXTRACTION, BILATERAL Bilateral 11/18/2017  . COLONOSCOPY N/A 12/27/2013   Procedure: COLONOSCOPY;  Surgeon: Barrie Folk, MD;  Location: [facility] ENDOSCOPY;  Service: Endoscopy;  Laterality: N/A;  . ENTEROSCOPY N/A 12/21/2013   Procedure: ENTEROSCOPY;  Surgeon: Vertell Novak., MD;  Location: [facility] ENDOSCOPY;  Service: Endoscopy;  Laterality: N/A;  . ENTEROSCOPY N/A 12/27/2013   Procedure: ENTEROSCOPY;  Surgeon: Barrie Folk, MD;  Location: [facility] ENDOSCOPY;  Service: Endoscopy;  Laterality: N/A;   Family History  Problem Relation Age of Onset  . Hyperlipidemia Father   . Kidney disease Father   . Heart disease Father   . Congestive Heart Failure Mother   . Heart disease Mother   . Hyperlipidemia Daughter   . Hyperlipidemia Son   . Diabetes Son   . Anxiety disorder Daughter   . Depression Daughter   . Arthritis Daughter    Social History   Socioeconomic History  . Marital status: Married    Spouse name: Not on file  . Number of children: 4  . Years of education: Not on file  . Highest education level: High school graduate  Occupational History  . Occupation: retired  Tobacco Use  . Smoking status: Former Smoker    Types: Cigarettes, Cigars    Quit date: 1990    Years since quitting: 32.1  . Smokeless tobacco: Never Used  . Tobacco comment: Smoked afternoon cigar  Vaping Use  . Vaping Use: Never used  Substance and Sexual Activity  . Alcohol use: No  . Drug use: No   . Sexual activity: Yes  Other Topics Concern  . Not on file  Social History Narrative  . Not on file   Social Determinants of Health   Financial Resource Strain: Low Risk   . Difficulty of Paying Living Expenses: Not hard at all  Food Insecurity: No Food Insecurity  . Worried About Programme researcher, broadcasting/film/video in the Last Year: Never true  . Ran Out of Food in the Last Year: Never true  Transportation Needs: No Transportation Needs  . Lack of Transportation (Medical): No  . Lack of Transportation (Non-Medical): No  Physical Activity: Inactive  . Days of Exercise per Week: 0 days  . Minutes of Exercise per Session: 0 min  Stress: No Stress Concern Present  . Feeling of Stress : Only a little  Social Connections:  Moderately Integrated  . Frequency of Communication with Friends and Family: More than three times a week  . Frequency of Social Gatherings with Friends and Family: Once a week  . Attends Religious Services: More than 4 times per year  . Active Member of Clubs or Organizations: No  . Attends Banker Meetings: Never  . Marital Status: Married    Tobacco Counseling Counseling given: Not Answered Comment: Smoked afternoon cigar   Clinical Intake:  Pre-visit preparation completed: Yes  Pain : No/denies pain     Nutritional Risks: None Diabetes: Yes CBG done?: No Did pt. bring in CBG monitor from home?: No  How often do you need to have someone help you when you read instructions, pamphlets, or other written materials from your doctor or pharmacy?: 1 - Never  Nutrition Risk Assessment:  Has the patient had any N/V/D within the last 2 months?  No  Does the patient have any non-healing wounds?  No  Has the patient had any unintentional weight loss or weight gain?  No   Diabetes:  Is the patient diabetic?  Yes  If diabetic, was a CBG obtained today?  No  Did the patient bring in their glucometer from home?  No  How often do you monitor your CBG's?  Daily fasting in am.   Financial Strains and Diabetes Management:  Are you having any financial strains with the device, your supplies or your medication? No .  Does the patient want to be seen by Chronic Care Management for management of their diabetes?  Yes - already enrolled Would the patient like to be referred to a Nutritionist or for Diabetic Management?  No   Diabetic Exams:  Diabetic Eye Exam: Completed 06/25/19 negative retinopathy.   Diabetic Foot Exam: Completed 05/04/19.  Interpreter Needed?: No  Information entered by :: Reather Littler LPN   Activities of Daily Living In your present state of health, do you have any difficulty performing the following activities: 04/15/2020 02/12/2020  Hearing? N N  Comment declines hearing aids -  Vision? Y N  Difficulty concentrating or making decisions? N N  Comment - -  Walking or climbing stairs? N N  Dressing or bathing? N N  Doing errands, shopping? N N  Preparing Food and eating ? N -  Using the Toilet? N -  In the past six months, have you accidently leaked urine? N -  Do you have problems with loss of bowel control? N -  Managing your Medications? N -  Managing your Finances? N -  Housekeeping or managing your Housekeeping? N -  Some recent data might be hidden    Patient Care Team: Alba Cory, MD as PCP - General (Family Medicine) End, Cristal Deer, MD as PCP - Cardiology (Cardiology) Mosetta Pigeon, MD (Nephrology) Gaspar Cola, [facility] as Pharmacist (Pharmacist) Erin Fulling, MD as Consulting Physician (Pulmonary Disease)  Indicate any recent Medical Services you may have received from other than [facility] providers in the past year (date may be approximate).     Assessment:   This is a routine wellness examination for Drew [name].  Hearing/Vision screen  Hearing Screening   125Hz  250Hz  500Hz  1000Hz  2000Hz  3000Hz  4000Hz  6000Hz  8000Hz   Right ear:           Left ear:           Comments:  Pt denies hearing  difficulty  Vision Screening Comments: Annual vision screenings done at [facility] in [place]  Dietary issues and  exercise activities discussed: Current Exercise Habits: The patient does not participate in regular exercise at present, Exercise limited by: neurologic condition(s)  Goals    . Chronic Care Management     CARE PLAN ENTRY  Current Barriers:  . Chronic Disease Management support, education, and care coordination needs related to Hypertension, Hyperlipidemia, Diabetes, Atrial Fibrillation, and Gastroesophageal Reflux Disease   Hypertension . Pharmacist Clinical Goal(s): o Over the next 90 days, patient will work with PharmD and providers to maintain BP goal <140/90 . Current regimen:  . Losartan 25 mg daily  . Carvedilol 3.125 mg twice daily  . Amlodipine 5 mg daily  . HCTZ 12.5 mg three times weekly . Patient self care activities - Over the next 90 days, patient will: o Check BP daily, document, and provide at future appointments o Take hydrochlorothiazide daily by timing with outdoor activities o Ensure daily salt intake < 2300 mg/day  Hyperlipidemia . Pharmacist Clinical Goal(s): o Over the next 90 days, patient will work with PharmD and providers to achieve LDL goal < 70 . Current regimen:  o Rosuvastatin 10 mg daily   Diabetes . Pharmacist Clinical Goal(s): o Over the next 90 days, patient will work with PharmD and providers to maintain A1c goal <7% . Current regimen:  o Victoza 1.2mg  injected daily . Patient self care activities - Over the next 90 days, patient will: o Check blood sugar once daily, document, and provide at future appointments o Contact provider with any episodes of hypoglycemia  Medication management . Pharmacist Clinical Goal(s): o Over the next 90 days, patient will work with PharmD and providers to achieve optimal medication adherence . Current pharmacy: [facility] . Interventions o Comprehensive medication review  performed. o Corrections to medication list o Continue current medication management strategy . Patient self care activities - Over the next 90 days, patient will: o Focus on medication adherence by taking medications as prescribed o Report any questions or concerns to PharmD and/or provider(s)    . DIET - EAT MORE FRUITS AND VEGETABLES     Recommend eating 3-4 servings of fruits and vegetables per day    . Prevent falls     Pt advised to install railing outside home for steps and grab bars in the shower.       Depression Screen PHQ 2/9 Scores 04/15/2020 02/12/2020 11/13/2019 08/13/2019 05/04/2019 03/01/2019 02/01/2019  PHQ - 2 Score 0 0 0 2 0 0 0  PHQ- 9 Score - - - 5 0 - 0    Fall Risk Fall Risk  04/15/2020 02/12/2020 11/13/2019 08/13/2019 05/04/2019  Falls in the past year? 0 1 1 1 1   Number falls in past yr: 1 1 0 0 1  Comment - - - - -  Injury with Fall? 0 0 1 1 1   Comment - - - Knee injury -  Risk for fall due to : History of fall(s) - History of fall(s) History of fall(s) -  Follow up Falls prevention discussed - - - -    FALL RISK PREVENTION PERTAINING TO THE HOME:  Any stairs in or around the home? Yes  If so, are there any without handrails? No  Home free of loose throw rugs in walkways, pet beds, electrical cords, etc? Yes  Adequate lighting in your home to reduce risk of falls? Yes   ASSISTIVE DEVICES UTILIZED TO PREVENT FALLS:  Life alert? No  Use of a cane, walker or w/c? No  Grab bars in the bathroom? Yes  Shower chair or bench in shower? No  Elevated toilet seat or a handicapped toilet? No   TIMED UP AND GO:  Was the test performed? No . Telephonic visit.   Cognitive Function:     6CIT Screen 04/15/2020 03/01/2019 01/27/2018  What Year? 0 points 0 points 0 points  What month? 0 points 0 points 0 points  What time? 0 points 0 points 0 points  Count back from 20 0 points 0 points 0 points  Months in reverse 2 points 0 points 0 points  Repeat phrase 4 points  2 points 2 points  Total Score 6 2 2     Immunizations Immunization History  Administered Date(s) Administered  . Fluad Quad(high Dose 65+) 11/13/2019  . Influenza, High Dose Seasonal PF 11/06/2014, 11/24/2015, 11/30/2016, 11/30/2017  . Influenza-Unspecified 12/29/2018  . PFIZER(Purple Top)SARS-COV-2 Vaccination 04/05/2019, 04/30/2019, 12/24/2019  . Pneumococcal Conjugate-13 12/14/2013  . Pneumococcal Polysaccharide-23 10/02/2009    TDAP status: Due, Education has been provided regarding the importance of this vaccine. Advised may receive this vaccine at local pharmacy or Health Dept. Aware to provide a copy of the vaccination record if obtained from local pharmacy or Health Dept. Verbalized acceptance and understanding.  Flu Vaccine status: Up to date  Pneumococcal vaccine status: Up to date  Covid-19 vaccine status: Completed vaccines  Qualifies for Shingles Vaccine? Yes   Zostavax completed No   Shingrix Completed?: No.    Education has been provided regarding the importance of this vaccine. Patient has been advised to call insurance company to determine out of pocket expense if they have not yet received this vaccine. Advised may also receive vaccine at local pharmacy or Health Dept. Verbalized acceptance and understanding.  Screening Tests Health Maintenance  Topic Date Due  . TETANUS/TDAP  05/03/2020 (Originally 07/10/1959)  . FOOT EXAM  05/03/2020  . OPHTHALMOLOGY EXAM  06/24/2020  . HEMOGLOBIN A1C  08/12/2020  . INFLUENZA VACCINE  Completed  . COVID-19 Vaccine  Completed  . PNA vac Low Risk Adult  Completed    Health Maintenance  There are no preventive care reminders to display for this patient.  Colorectal cancer screening: No longer required.   Lung Cancer Screening: (Low Dose CT Chest recommended if Age 31-80 years, 30 pack-year currently smoking OR have quit w/in 15years.) does not qualify.   Additional Screening:  Hepatitis C Screening: does not qualify.    Vision Screening: Recommended annual ophthalmology exams for early detection of glaucoma and other disorders of the eye. Is the patient up to date with their annual eye exam?  Yes  Who is the provider or what is the name of the office in which the patient attends annual eye exams? Dr. Bernestine AmassKellamy  Dental Screening: Recommended annual dental exams for proper oral hygiene  Community Resource Referral / Chronic Care Management: CRR required this visit?  No   CCM required this visit?  No      Plan:     I have personally reviewed and noted the following in the patient's chart:   . Medical and social history . Use of alcohol, tobacco or illicit drugs  . Current medications and supplements . Functional ability and status . Nutritional status . Physical activity . Advanced directives . List of other physicians . Hospitalizations, surgeries, and ER visits in previous 12 months . Vitals . Screenings to include cognitive, depression, and falls . Referrals and appointments  In addition, I have reviewed and discussed with patient certain preventive protocols, quality metrics, and best  practice recommendations. A written personalized care plan for preventive services as well as general preventive health recommendations were provided to patient.     Reather Littler, LPN   10/17/4156   Nurse Notes: none

## 2020-04-15 NOTE — Patient Instructions (Signed)
Drew [name] , Thank you for taking time to come for your Medicare Wellness Visit. I appreciate your ongoing commitment to your health goals. Please review the following plan we discussed and let me know if I can assist you in the future.   Screening recommendations/referrals: Colonoscopy: no longer required Recommended yearly ophthalmology/optometry visit for glaucoma screening and checkup Recommended yearly dental visit for hygiene and checkup  Vaccinations: Influenza vaccine: done 11/13/19 Pneumococcal vaccine: done 12/14/13 Tdap vaccine: due Shingles vaccine: Shingrix discussed. Please contact your pharmacy for coverage information.  Covid-19: done 04/05/19, 04/30/19 & 12/24/19  Advanced directives: Advance directive discussed with you today. I have provided a copy for you to complete at home and have notarized. Once this is complete please bring a copy in to our office so we can scan it into your chart.  Conditions/risks identified: Recommend continuing fall prevention in the home  Next appointment: Follow up in one year for your annual wellness visit.   Preventive Care 18 Years and Older, Male Preventive care refers to lifestyle choices and visits with your health care [name] that can promote health and wellness. What does preventive care include?  A yearly physical exam. This is also called an annual well check.  Dental exams once or twice a year.  Routine eye exams. Ask your health care [name] how often you should have your eyes checked.  Personal lifestyle choices, including:  Daily care of your teeth and gums.  Regular physical activity.  Eating a healthy diet.  Avoiding tobacco and drug use.  Limiting alcohol use.  Practicing safe sex.  Taking low doses of aspirin every day.  Taking vitamin and mineral supplements as recommended by your health care [name]. What happens during an annual well check? The services and screenings done by your health care [name]  during your annual well check will depend on your age, overall health, lifestyle risk factors, and family history of disease. Counseling  Your health care [name] may ask you questions about your:  Alcohol use.  Tobacco use.  Drug use.  Emotional well-being.  Home and relationship well-being.  Sexual activity.  Eating habits.  History of falls.  Memory and ability to understand (cognition).  Work and work [name]. Screening  You may have the following tests or measurements:  Height, weight, and BMI.  Blood pressure.  Lipid and cholesterol levels. These may be checked every 5 years, or more frequently if you are over 8 years old.  Skin check.  Lung cancer screening. You may have this screening every year starting at age 62 if you have a 30-pack-year history of smoking and currently smoke or have quit within the past 15 years.  Fecal occult blood test (FOBT) of the stool. You may have this test every year starting at age 29.  Flexible sigmoidoscopy or colonoscopy. You may have a sigmoidoscopy every 5 years or a colonoscopy every 10 years starting at age 61.  Prostate cancer screening. Recommendations will vary depending on your family history and other risks.  Hepatitis C blood test.  Hepatitis B blood test.  Sexually transmitted disease (STD) testing.  Diabetes screening. This is done by checking your blood sugar (glucose) after you have not eaten for a while (fasting). You may have this done every 1-3 years.  Abdominal aortic aneurysm (AAA) screening. You may need this if you are a current or former smoker.  Osteoporosis. You may be screened starting at age 57 if you are at high risk. Talk with your health  care [name] about your test results, treatment options, and if necessary, the need for more tests. Vaccines  Your health care [name] may recommend certain vaccines, such as:  Influenza vaccine. This is recommended every year.  Tetanus,  diphtheria, and acellular pertussis (Tdap, Td) vaccine. You may need a Td booster every 10 years.  Zoster vaccine. You may need this after age 42.  Pneumococcal 13-valent conjugate (PCV13) vaccine. One dose is recommended after age 68.  Pneumococcal polysaccharide (PPSV23) vaccine. One dose is recommended after age 44. Talk to your health care [name] about which screenings and vaccines you need and how often you need them. This information is not intended to replace advice given to you by your health care [name]. Make sure you discuss any questions you have with your health care [name]. Document Released: 03/07/2015 Document Revised: 10/29/2015 Document Reviewed: 12/10/2014 Elsevier Interactive Patient Education  2017 [place] Prevention in the Home Falls can cause injuries. They can happen to people of all ages. There are many things you can do to make your home safe and to help prevent falls. What can I do on the outside of my home?  Regularly fix the edges of walkways and driveways and fix any cracks.  Remove anything that might make you trip as you walk through a door, such as a raised step or threshold.  Trim any bushes or trees on the path to your home.  Use bright outdoor lighting.  Clear any walking paths of anything that might make someone trip, such as rocks or tools.  Regularly check to see if handrails are loose or broken. Make sure that both sides of any steps have handrails.  Any raised decks and porches should have guardrails on the edges.  Have any leaves, snow, or ice cleared regularly.  Use sand or salt on walking paths during winter.  Clean up any spills in your garage right away. This includes oil or grease spills. What can I do in the bathroom?  Use night lights.  Install grab bars by the toilet and in the tub and shower. Do not use towel bars as grab bars.  Use non-skid mats or decals in the tub or shower.  If you need to sit down in  the shower, use a plastic, non-slip stool.  Keep the floor dry. Clean up any water that spills on the floor as soon as it happens.  Remove soap buildup in the tub or shower regularly.  Attach bath mats securely with double-sided non-slip rug tape.  Do not have throw rugs and other things on the floor that can make you trip. What can I do in the bedroom?  Use night lights.  Make sure that you have a light by your bed that is easy to reach.  Do not use any sheets or blankets that are too big for your bed. They should not hang down onto the floor.  Have a firm chair that has side arms. You can use this for support while you get dressed.  Do not have throw rugs and other things on the floor that can make you trip. What can I do in the kitchen?  Clean up any spills right away.  Avoid walking on wet floors.  Keep items that you use a lot in easy-to-reach places.  If you need to reach something above you, use a strong step stool that has a grab bar.  Keep electrical cords out of the way.  Do not use floor  polish or wax that makes floors slippery. If you must use wax, use non-skid floor wax.  Do not have throw rugs and other things on the floor that can make you trip. What can I do with my stairs?  Do not leave any items on the stairs.  Make sure that there are handrails on both sides of the stairs and use them. Fix handrails that are broken or loose. Make sure that handrails are as long as the stairways.  Check any carpeting to make sure that it is firmly attached to the stairs. Fix any carpet that is loose or worn.  Avoid having throw rugs at the top or bottom of the stairs. If you do have throw rugs, attach them to the floor with carpet tape.  Make sure that you have a light switch at the top of the stairs and the bottom of the stairs. If you do not have them, ask someone to add them for you. What else can I do to help prevent falls?  Wear shoes that:  Do not have high  heels.  Have rubber bottoms.  Are comfortable and fit you well.  Are closed at the toe. Do not wear sandals.  If you use a stepladder:  Make sure that it is fully opened. Do not climb a closed stepladder.  Make sure that both sides of the stepladder are locked into place.  Ask someone to hold it for you, if possible.  Clearly mark and make sure that you can see:  Any grab bars or handrails.  First and last steps.  Where the edge of each step is.  Use tools that help you move around (mobility aids) if they are needed. These include:  Canes.  Walkers.  Scooters.  Crutches.  Turn on the lights when you go into a dark area. Replace any light bulbs as soon as they burn out.  Set up your furniture so you have a clear path. Avoid moving your furniture around.  If any of your floors are uneven, fix them.  If there are any pets around you, be aware of where they are.  Review your medicines with your doctor. Some medicines can make you feel dizzy. This can increase your chance of falling. Ask your doctor what other things that you can do to help prevent falls. This information is not intended to replace advice given to you by your health care Francia Verry. Make sure you discuss any questions you have with your health care Adamarys Shall. Document Released: 12/05/2008 Document Revised: 07/17/2015 Document Reviewed: 03/15/2014 Elsevier Interactive Patient Education  2017 [employer].

## 2020-04-16 ENCOUNTER — Ambulatory Visit (INDEPENDENT_AMBULATORY_CARE_PROVIDER_SITE_OTHER): Payer: Medicare Other | Admitting: Internal Medicine

## 2020-04-16 ENCOUNTER — Encounter: Payer: Self-pay | Admitting: Internal Medicine

## 2020-04-16 ENCOUNTER — Other Ambulatory Visit: Payer: Self-pay

## 2020-04-16 VITALS — BP 134/80 | HR 89 | Temp 97.3°F | Ht 68.0 in | Wt 229.2 lb

## 2020-04-16 DIAGNOSIS — Z9989 Dependence on other enabling machines and devices: Secondary | ICD-10-CM

## 2020-04-16 DIAGNOSIS — G4733 Obstructive sleep apnea (adult) (pediatric): Secondary | ICD-10-CM | POA: Diagnosis not present

## 2020-04-16 NOTE — Progress Notes (Signed)
PULMONARY/SLEEP OFFICE FOLLOW-UP NOTE   Requesting MD/Service: Yvonne Kendall, MD Date of initial consultation: 08/31 Reason for consultation: Snoring, obesity, suspected OSA  PT PROFILE: 80 y.o. male former smoker referred for evaluation of fatigue and daytime sleepiness  DATA: 03/17/16 echocardiogram: Grade 1 diastolic dysfunction. Otherwise normal. 11/11/16 PSG: AHI 29.5/hr. AutoSet 5-20 cm H2O recommended 01/06-02/04/20 CPAP compliance: Usage 30/30.  >4 hours: 30/30.  Median pressure 6.7 cm H2O.  Mean AHI 1.3/hour. 02/2020 compliance DL-->Compliance report reviewed with patient in detail 100% compliance for days and >4 hrs AHI reduced to 0.7 Cpap 5-20      CC Follow up OSA   HPI Doing well with CPAP More energy and less fatiogue Gets more refreshed sleep  No evidence of heart failure at this time No evidence or signs of infection at this time No respiratory distress No fevers, chills, nausea, vomiting, diarrhea No evidence of lower extremity edema No evidence hemoptysis  Compliance report reviewed with patient in detail 100% compliance for days and >4 hrs AHI reduced to 0.7 Cpap 5-20     Review of Systems:  Gen:  Denies  fever, sweats, chills weight loss  HEENT: Denies blurred vision, double vision, ear pain, eye pain, hearing loss, nose bleeds, sore throat Cardiac:  No dizziness, chest pain or heaviness, chest tightness,edema, No JVD Resp:   No cough, -sputum production, -shortness of breath,-wheezing, -hemoptysis,  Gi: Denies swallowing difficulty, stomach pain, nausea or vomiting, diarrhea, constipation, bowel incontinence Gu:  Denies bladder incontinence, burning urine Ext:   Denies Joint pain, stiffness or swelling Skin: Denies  skin rash, easy bruising or bleeding or hives Endoc:  Denies polyuria, polydipsia , polyphagia or weight change Psych:   Denies depression, insomnia or hallucinations  Other:  All other systems negative   BP 134/80 (BP  Location: Left Arm, Cuff Size: Normal)   Pulse 89   Temp (!) 97.3 F (36.3 C) (Temporal)   Ht 5\' 8"  (1.727 m)   Wt 229 lb 3.2 oz (104 kg)   SpO2 96%   BMI 34.85 kg/m    Physical Examination:   General Appearance: No distress  Neuro:without focal findings,  speech normal,  HEENT: PERRLA, EOM intact.   Pulmonary: normal breath sounds, No wheezing.  CardiovascularNormal S1,S2.  No m/r/g.   Abdomen: Benign, Soft, non-tender. Renal:  No costovertebral tenderness  GU:  Not performed at this time. Endoc: No evident thyromegaly Skin:   warm, no rashes, no ecchymosis  Extremities: normal, no cyanosis, clubbing. PSYCHIATRIC: Mood, affect within normal limits.   ALL OTHER ROS ARE NEGATIVE    DATA:   BMP Latest Ref Rng & Units 01/08/2020 05/04/2019 10/23/2018  Glucose 70 - 99 mg/dL 10/25/2018) 384(Y) -  BUN 8 - 23 mg/dL 17 14 -  Creatinine 659(D - 1.24 mg/dL 3.57) 0.17(B) 9.39(Q)  BUN/Creat Ratio 6 - 22 (calc) - 11 -  Sodium 135 - 145 mmol/L 138 143 -  Potassium 3.5 - 5.1 mmol/L 4.1 4.1 -  Chloride 98 - 111 mmol/L 104 108 -  CO2 22 - 32 mmol/L 23 24 -  Calcium 8.9 - 10.3 mg/dL 9.6 9.6 -    CBC Latest Ref Rng & Units 05/04/2019 08/30/2017 02/09/2016  WBC 3.8 - 10.8 Thousand/uL 5.6 6.3 5.9  Hemoglobin 13.2 - 17.1 g/dL 02/11/2016 23.3 00.7  Hematocrit 38.5 - 50.0 % 45.5 43.9 48.2  Platelets 140 - 400 Thousand/uL 137(L) 149 187    CXR:  No recent film   PLAN:   OSA  SEVERE diagnosis with AHI of 30 Continue CPAP as prescribed Patient has excellent compliance report Sleep apnea is under control with AHI reduced to 0.7 Patient use and benefits from CPAP therapy  Hypertension Sleep apnea can contribute to hypertension therefore treatment of sleep apnea is important part of hypertension management      MEDICATION ADJUSTMENTS/LABS AND TESTS ORDERED: CONTINUE CPAP AS PRESCRIBED ADVISED TO WEAR CPAP DURING NAP TIME   CURRENT MEDICATIONS REVIEWED AT LENGTH WITH PATIENT  TODAY   Patient satisfied with Plan of action and management. All questions answered  Follow up in 1 year  Total time spent 22 mins   Kurian Santiago Glad, M.D.  [facility]  Medical Director [facility] Medical Director [facility] Cardio-Pulmonary Department

## 2020-04-16 NOTE — Patient Instructions (Signed)
CONTINUE CPAP AS PRESCRIBED ADVISED TO WEAR CPAP DURING NAP TIME  A+!!! GREAT JOB!!!

## 2020-04-23 ENCOUNTER — Telehealth: Payer: Self-pay

## 2020-04-23 NOTE — Progress Notes (Signed)
Chronic Care Management Pharmacy Assistant   Name: Drew [name]  MRN: [id number] DOB: October 05, 1940  Reason for Encounter: Medication Review /Patient assistance   PCP : Alba Cory, MD  Allergies:   Allergies  Allergen Reactions  . Niacin And Related Itching and Rash    Medications: Outpatient Encounter Medications as of 04/23/2020  Medication Sig Note  . acetaminophen (TYLENOL) 500 MG tablet Take 500 mg by mouth every 6 (six) hours as needed.   Marland Kitchen allopurinol (ZYLOPRIM) 100 MG tablet Take 100 mg by mouth 2 (two) times daily. 01/15/2020: Prescribed by Dr. Mosetta Pigeon  . amLODipine (NORVASC) 5 MG tablet TAKE 1 TABLET BY MOUTH  DAILY   . Apoaequorin (PREVAGEN PO) Take by mouth.   . Ascorbic Acid (VITAMIN C) 1000 MG tablet Take 1,000 mg by mouth daily.   Marland Kitchen aspirin EC 81 MG tablet Take 81 mg by mouth daily.   . carvedilol (COREG) 3.125 MG tablet TAKE 1 TABLET BY MOUTH  TWICE DAILY   . Cholecalciferol (VITAMIN D3) 25 MCG (1000 UT) CAPS Take 1,000 Units by mouth daily.   . Coenzyme Q10 (CO Q 10) 100 MG CAPS Take 100 mg by mouth daily.   . famotidine (PEPCID) 20 MG tablet Take 1 tablet (20 mg total) by mouth daily.   . hydrochlorothiazide (MICROZIDE) 12.5 MG capsule TAKE 1 CAPSULE BY MOUTH 3  TIMES WEEKLY   . levothyroxine (SYNTHROID) 25 MCG tablet TAKE 1 TABLET BY MOUTH IN  THE MORNING   . liraglutide (VICTOZA) 18 MG/3ML SOPN Inject 1.2 mg into the skin daily.   Marland Kitchen losartan (COZAAR) 25 MG tablet TAKE 1 TABLET BY MOUTH  DAILY   . Multiple Vitamins-Minerals (CENTRUM SILVER PO) Take 200 mg by mouth daily.   . Needle, Disp, 32G X 5/16" MISC 6 mm by Does not apply route 2 (two) times daily.   . Omega-3 Fatty Acids (FISH OIL) 1000 MG CAPS Take 1,000 mg by mouth daily.   . rosuvastatin (CRESTOR) 20 MG tablet Take 1 tablet (20 mg total) by mouth daily.   . Turmeric 500 MG CAPS Take 500 mg by mouth daily.    No facility-administered encounter medications on file as of 04/23/2020.     Current Diagnosis: Patient Active Problem List   Diagnosis Date Noted  . Atrophy of kidney 10/10/2019  . Edema of lower extremity 10/10/2019  . Proteinuria 10/10/2019  . Thrombocytopenia (HCC) 08/13/2019  . Diabetes mellitus type 2 in obese (HCC) 08/13/2019  . Dyslipidemia associated with type 2 diabetes mellitus (HCC) 08/13/2019  . Neuropathy involving both lower extremities 05/04/2019  . Polyneuropathy, unspecified 05/04/2019  . Multiple renal cysts 04/09/2019  . Type II or unspecified type diabetes mellitus with renal manifestations, not stated as uncontrolled 04/09/2019  . Stable angina (HCC) 12/29/2018  . Chest pain 05/29/2018  . Onychomycosis of multiple toenails with type 2 diabetes mellitus (HCC) 11/30/2017  . Chronic heart failure with preserved ejection fraction (HFpEF) (HCC) 10/20/2017  . Heart failure, unspecified (HCC) 10/20/2017  . Diastolic dysfunction 02/10/2017  . Obstructive sleep apnea 02/10/2017  . Dyspnea on exertion 10/20/2016  . Chronic fatigue 10/20/2016  . Pain in both lower extremities 10/20/2016  . Arm pain, medial, right 08/27/2016  . Encounter for cardiac risk counseling 02/10/2016  . Light-headedness 11/26/2015  . Abnormal ankle brachial index (ABI) 11/26/2015  . Stage 3a chronic kidney disease (HCC) 08/15/2015  . H/O: upper GI bleed   . Medication monitoring encounter 08/07/2015  . Morbid  obesity (HCC) 08/07/2015  . Hypothyroidism due to acquired atrophy of thyroid 03/11/2015  . History of prostatitis 11/06/2014  . Essential hypertension 11/06/2014  . Mixed hyperlipidemia 11/06/2014  . 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 Flat feet, bilateral 11/06/2014  . History of anemia 11/06/2014  . Chronic multiple gastric erosions 11/06/2014  . Scrotal swelling 11/06/2014  . 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 Chronic gout of ankle 08/16/2014  . H/O: GI bleed 12/20/2013   . Personal history of other diseases of the digestive system 12/20/2013    Goals Addressed   None    Spoke with patient to see if he receive his patient assistance form for Victoza. Patient states he did receive it but has not fill out the form yet because when he receive his medication from his pharmacy  [facility]. Patient states it was $131.00 instead of $500.00, so patient was unsure if he needed to complete the form.  I explain to him that the price may go up again and it be better to have patient assistance incase it does.  Patient verbalized understanding and states he will complete the form and return it the [facility].Notify Clinical Pharmacist.  Follow-Up:  Patient Assistance Coordination and Pharmacist Review   Everlean Cherry Clinical Pharmacist Assistant [phone]

## 2020-05-15 ENCOUNTER — Telehealth: Payer: Self-pay

## 2020-05-15 NOTE — Progress Notes (Signed)
Chronic Care Management Pharmacy Assistant   Name: Drew [name]  MRN: [id number] DOB: [birth year]   Reason for Encounter:Hypertension Disease State Call.   Recent office visits:  04/15/2020 PCP Office   Recent consult visits:  04/07/2020 Nephrology [facility] Thedore Mins Hold amlodipine 5 mg  04/16/2020 Pulmonology [facility] visits:  None in previous 6 months  Medications: Outpatient Encounter Medications as of 05/15/2020  Medication Sig Note  . acetaminophen (TYLENOL) 500 MG tablet Take 500 mg by mouth every 6 (six) hours as needed.   Marland Kitchen allopurinol (ZYLOPRIM) 100 MG tablet Take 100 mg by mouth 2 (two) times daily. 01/15/2020: Prescribed by Dr. Mosetta Pigeon  . amLODipine (NORVASC) 5 MG tablet TAKE 1 TABLET BY MOUTH  DAILY   . Apoaequorin (PREVAGEN PO) Take by mouth.   . Ascorbic Acid (VITAMIN C) 1000 MG tablet Take 1,000 mg by mouth daily.   Marland Kitchen aspirin EC 81 MG tablet Take 81 mg by mouth daily.   . carvedilol (COREG) 3.125 MG tablet TAKE 1 TABLET BY MOUTH  TWICE DAILY   . Cholecalciferol (VITAMIN D3) 25 MCG (1000 UT) CAPS Take 1,000 Units by mouth daily.   . Coenzyme Q10 (CO Q 10) 100 MG CAPS Take 100 mg by mouth daily.   . famotidine (PEPCID) 20 MG tablet Take 1 tablet (20 mg total) by mouth daily.   . hydrochlorothiazide (MICROZIDE) 12.5 MG capsule TAKE 1 CAPSULE BY MOUTH 3  TIMES WEEKLY   . levothyroxine (SYNTHROID) 25 MCG tablet TAKE 1 TABLET BY MOUTH IN  THE MORNING   . liraglutide (VICTOZA) 18 MG/3ML SOPN Inject 1.2 mg into the skin daily.   Marland Kitchen losartan (COZAAR) 25 MG tablet TAKE 1 TABLET BY MOUTH  DAILY   . Multiple Vitamins-Minerals (CENTRUM SILVER PO) Take 200 mg by mouth daily.   . Needle, Disp, 32G X 5/16" MISC 6 mm by Does not apply route 2 (two) times daily.   . Omega-3 Fatty Acids (FISH OIL) 1000 MG CAPS Take 1,000 mg by mouth daily.   . rosuvastatin (CRESTOR) 20 MG tablet Take 1 tablet (20 mg total) by mouth daily.   . Turmeric 500 MG CAPS Take 500 mg by  mouth daily.    No facility-administered encounter medications on file as of 05/15/2020.      Star Rating Drugs:losartan,rosuvastatin  Reviewed chart prior to disease state call. Spoke with patient regarding BP  Recent Office Vitals: BP Readings from Last 3 Encounters:  04/16/20 134/80  02/12/20 138/86  02/06/20 130/82   Pulse Readings from Last 3 Encounters:  04/16/20 89  02/12/20 63  02/06/20 (!) 58    Wt Readings from Last 3 Encounters:  04/16/20 229 lb 3.2 oz (104 kg)  02/12/20 227 lb 14.4 oz (103.4 kg)  02/06/20 228 lb (103.4 kg)     Kidney Function Lab Results  Component Value Date/Time   CREATININE 1.32 (H) 01/08/2020 01:56 PM   CREATININE 1.22 (H) 05/04/2019 10:02 AM   CREATININE 1.30 (H) 10/23/2018 03:01 PM   CREATININE 1.34 (H) 08/02/2018 11:14 AM   GFRNONAA 55 (L) 01/08/2020 01:56 PM   GFRNONAA 56 (L) 05/04/2019 10:02 AM   GFRAA 65 05/04/2019 10:02 AM    BMP Latest Ref Rng & Units 01/08/2020 05/04/2019 10/23/2018  Glucose 70 - 99 mg/dL 505(L) 976(B) -  BUN 8 - 23 mg/dL 17 14 -  Creatinine 3.41 - 1.24 mg/dL 9.37(T) 0.24(O) 9.73(Z)  BUN/Creat Ratio 6 - 22 (calc) - 11 -  Sodium 135 - 145 mmol/L 138 143 -  Potassium 3.5 - 5.1 mmol/L 4.1 4.1 -  Chloride 98 - 111 mmol/L 104 108 -  CO2 22 - 32 mmol/L 23 24 -  Calcium 8.9 - 10.3 mg/dL 9.6 9.6 -    . Current antihypertensive regimen:   Losartan 25 mg daily   Carvedilol 3.125 mg twice daily   Amlodipine 5 mg daily   HCTZ 12.5 mg three times weekly . How often are you checking your Blood Pressure? daily . Current home BP readings:  o On 05/11/2020 at 2:10 pm it was 132/72 with a pulse of 55. o On 05/13/2020 at 10:00 am it was 131/77 with a pulse of 54. o On 05/14/2020 at 10:.00 am it was 124/82 with a pulse of 75. o On 05/15/2020 at 11:25 am it was 111/66 with a pulse of 75.  Patient reports headaches and dizziness for two days.Patient states he walked around and that seem to help. . What recent  interventions/DTPs have been made by any provider to improve Blood Pressure control since last CPP Visit: None ID . Any recent hospitalizations or ED visits since last visit with CPP? No . What diet changes have been made to improve Blood Pressure Control?  o Patient states his diet has been the same. . What exercise is being done to improve your Blood Pressure Control?  o Patient states he walks his dog around his house and to his garden .  Patient is asking if any of his medication are "going against each other" because once in awhile he will feel funny after he takes his medications.Patient reports this does not happen all the time.Notifed clinical Pharmacist.   Adherence Review: Is the patient currently on ACE/ARB medication? Yes Does the patient have >5 day gap between last estimated fill dates? Yes   Everlean Cherry Clinical Pharmacist Assistant [phone]

## 2020-05-19 ENCOUNTER — Other Ambulatory Visit
Admission: RE | Admit: 2020-05-19 | Discharge: 2020-05-19 | Disposition: A | Discharge status: Home / Self Care | Payer: Medicare Other | Source: Ambulatory Visit | Attending: Internal Medicine | Admitting: Internal Medicine

## 2020-05-19 DIAGNOSIS — I2510 Atherosclerotic heart disease of native coronary artery without angina pectoris: Secondary | ICD-10-CM | POA: Diagnosis not present

## 2020-05-19 LAB — LIPID PANEL
Cholesterol: 119 mg/dL (ref 0–200)
HDL: 34 mg/dL — ABNORMAL LOW (ref >40)
LDL Cholesterol: 44 mg/dL (ref 0–99)
Total CHOL/HDL Ratio: 3.5 RATIO
Triglycerides: 205 mg/dL — ABNORMAL HIGH (ref <150)
VLDL: 41 mg/dL — ABNORMAL HIGH (ref 0–40)

## 2020-05-19 LAB — ALT: ALT: 38 U/L (ref 0–44)

## 2020-05-21 ENCOUNTER — Telehealth: Payer: Self-pay

## 2020-05-21 NOTE — Progress Notes (Signed)
    Chronic Care Management Pharmacy Assistant   Name: Drew [name]  MRN: [id number] DOB: [birth year]    Reason for Encounter: Medication Review   Medications: Outpatient Encounter Medications as of 05/21/2020  Medication Sig Note  . acetaminophen (TYLENOL) 500 MG tablet Take 500 mg by mouth every 6 (six) hours as needed.   Marland Kitchen allopurinol (ZYLOPRIM) 100 MG tablet Take 100 mg by mouth 2 (two) times daily. 01/15/2020: Prescribed by Dr. Mosetta Pigeon  . amLODipine (NORVASC) 5 MG tablet TAKE 1 TABLET BY MOUTH  DAILY   . Apoaequorin (PREVAGEN PO) Take by mouth.   . Ascorbic Acid (VITAMIN C) 1000 MG tablet Take 1,000 mg by mouth daily.   Marland Kitchen aspirin EC 81 MG tablet Take 81 mg by mouth daily.   . carvedilol (COREG) 3.125 MG tablet TAKE 1 TABLET BY MOUTH  TWICE DAILY   . Cholecalciferol (VITAMIN D3) 25 MCG (1000 UT) CAPS Take 1,000 Units by mouth daily.   . Coenzyme Q10 (CO Q 10) 100 MG CAPS Take 100 mg by mouth daily.   . famotidine (PEPCID) 20 MG tablet Take 1 tablet (20 mg total) by mouth daily.   . hydrochlorothiazide (MICROZIDE) 12.5 MG capsule TAKE 1 CAPSULE BY MOUTH 3  TIMES WEEKLY   . levothyroxine (SYNTHROID) 25 MCG tablet TAKE 1 TABLET BY MOUTH IN  THE MORNING   . liraglutide (VICTOZA) 18 MG/3ML SOPN Inject 1.2 mg into the skin daily.   Marland Kitchen losartan (COZAAR) 25 MG tablet TAKE 1 TABLET BY MOUTH  DAILY   . Multiple Vitamins-Minerals (CENTRUM SILVER PO) Take 200 mg by mouth daily.   . Needle, Disp, 32G X 5/16" MISC 6 mm by Does not apply route 2 (two) times daily.   . Omega-3 Fatty Acids (FISH OIL) 1000 MG CAPS Take 1,000 mg by mouth daily.   . rosuvastatin (CRESTOR) 20 MG tablet Take 1 tablet (20 mg total) by mouth daily.   . Turmeric 500 MG CAPS Take 500 mg by mouth daily.    No facility-administered encounter medications on file as of 05/21/2020.    Performed cost analysis for patient, estimated yearly medication cost of $0.00.  Everlean Cherry Clinical Pharmacist  Assistant [phone]

## 2020-05-21 NOTE — Telephone Encounter (Signed)
[employer] Patient Assistance form for Victoza completed by patient and faxed for review on 05/21/20  Garey Ham, CPP Clinical Pharmacist [facility] [phone]

## 2020-05-27 ENCOUNTER — Other Ambulatory Visit: Payer: Self-pay | Admitting: Family Medicine

## 2020-05-27 ENCOUNTER — Other Ambulatory Visit: Payer: Self-pay | Admitting: Internal Medicine

## 2020-05-27 DIAGNOSIS — E034 Atrophy of thyroid (acquired): Secondary | ICD-10-CM

## 2020-06-12 ENCOUNTER — Other Ambulatory Visit: Payer: Self-pay | Admitting: Internal Medicine

## 2020-06-12 ENCOUNTER — Other Ambulatory Visit: Payer: Self-pay | Admitting: Family Medicine

## 2020-06-12 DIAGNOSIS — E034 Atrophy of thyroid (acquired): Secondary | ICD-10-CM

## 2020-06-12 DIAGNOSIS — Z8711 Personal history of peptic ulcer disease: Secondary | ICD-10-CM

## 2020-06-12 NOTE — Progress Notes (Signed)
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 His weight is going down, but BMI is now below 35, A1C had  gone from 6.2 % to 6.9 % today is 6.3% He states now using Victoza daily and denies hypoglycemic episodes, glucose at home has been within normal range . Denies polyphagia, polyuria or polydipsia.  He is on ARB and statin therapy Reminded of yearly eye exam   Thrombocytopenia: we will recheck CBC today   OSA: he is complaint with CPAP, he has a new mask and no longer leaking He sleeps well through the night . Unchanged   HTN: bp is at goal, he has intermittent chest pain, on ARB  and beta blocker and norvasc. He denies dizziness, no orthopnea, but has intermittent chest pain and also sob with moderate activity. He still splurges on salt intermittently , explained importance of DASH diet   Dyslipidemia: discussed adding fish to diet, his last LDL was not at goal, he was seen by Dr. Okey Dupre recently and cardiac angiogram was abnormal, Dr. Okey Dupre adjusted dose of Crestor to 10 mg and last LDL was at goal at 44  CHF :under the care of Dr. Okey Dupre, no orthopnea, wheezing he has sob with moderate activity - he states he had COVID 19 earlier this year and has increase with SOB in the beginning of the year. He has intermittent lower extremity edema.  he is on beta-blocker and ARB, takes aspirin daily .  Stable Angina: he has intermittent chest pain but no episodes since January of 2022, under the care of cardiologist, he does not have NTG at home. He states he has been doing well, taking higher dose of Crestor and denies side effects.   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 Thedore Mins, no pruritus, good urine output, last GFR stable  BP is at goal   Hypothyroidism: on the same dose of levothyroxine and last TSH at goal, weight is stable, denies dysphagia. Denies dry skin or change in bowel movements We will recheck TSH today   History of gastric ulcer: denies blood in stools or melena, normal appetite, no epigastric pain , no anemia on recent labs done by Dr. Thedore Mins   Skin laceration: it happened while pulling branches of a bush, it cut his left hand - 5 th finger, he states it bleed and is healing, it happened last week, we will give him a booster Tdap today   Patient Active Problem List   Diagnosis Date Noted  . Atrophy of kidney 10/10/2019  . Edema of lower extremity 10/10/2019  . Proteinuria 10/10/2019  . Thrombocytopenia (HCC) 08/13/2019  . Diabetes mellitus type 2 in obese (HCC) 08/13/2019  . Dyslipidemia associated with type 2 diabetes mellitus (HCC) 08/13/2019  . Neuropathy involving both lower extremities 05/04/2019  . Polyneuropathy, unspecified 05/04/2019  . Multiple renal cysts 04/09/2019  . Type II or unspecified type diabetes mellitus with renal manifestations, not stated as uncontrolled 04/09/2019  . Stable angina (HCC) 12/29/2018  . Chest pain 05/29/2018  .  Onychomycosis of multiple toenails with type 2 diabetes mellitus (HCC) 11/30/2017  . Chronic heart failure with preserved ejection fraction (HFpEF) (HCC) 10/20/2017  . Heart failure, unspecified (HCC) 10/20/2017  . Diastolic dysfunction 02/10/2017  . Obstructive sleep apnea 02/10/2017  . Dyspnea on exertion 10/20/2016  . Chronic fatigue 10/20/2016  . Pain in both lower extremities 10/20/2016  . Arm pain, medial, right 08/27/2016  . Encounter for cardiac risk counseling 02/10/2016  . Light-headedness 11/26/2015  . Abnormal ankle brachial index (ABI) 11/26/2015  . Stage 3a chronic kidney disease (HCC) 08/15/2015  . H/O: upper GI bleed    . Medication monitoring encounter 08/07/2015  . Morbid obesity (HCC) 08/07/2015  . Hypothyroidism due to acquired atrophy of thyroid 03/11/2015  . History of prostatitis 11/06/2014  . Essential hypertension 11/06/2014  . Mixed hyperlipidemia 11/06/2014  . 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 Flat feet, bilateral 11/06/2014  . History of anemia 11/06/2014  . Chronic multiple gastric erosions 11/06/2014  . Scrotal swelling 11/06/2014  . 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 Chronic gout of ankle 08/16/2014  . H/O: GI bleed 12/20/2013  . Personal history of other diseases of the digestive system 12/20/2013    Past Surgical History:  Procedure Laterality Date  . CATARACT EXTRACTION, BILATERAL Bilateral 11/18/2017  . COLONOSCOPY N/A 12/27/2013   Procedure: COLONOSCOPY;  Surgeon: Barrie FolkJohn C Hayes, MD;  [facility];  Service: Endoscopy;  Laterality: N/A;  . ENTEROSCOPY N/A 12/21/2013   Procedure: ENTEROSCOPY;  Surgeon: Vertell NovakJames L Edwards Jr., MD;  [facility];  Service: Endoscopy;  Laterality: N/A;  . ENTEROSCOPY N/A 12/27/2013   Procedure: ENTEROSCOPY;  Surgeon: Barrie FolkJohn C Hayes, MD;  [facility];  Service: Endoscopy;  Laterality: N/A;    Family History  Problem Relation Age of Onset  . Hyperlipidemia Father   . Kidney disease Father   . Heart disease Father   . Congestive Heart Failure Mother   . Heart disease Mother   . Hyperlipidemia Daughter   . Hyperlipidemia Son   . Diabetes Son   . Anxiety disorder Daughter   . Depression Daughter   . Arthritis Daughter     Social History   Tobacco Use  . Smoking status: Former Smoker    Years: 1.00    Types: Cigarettes, Cigars    Quit date: 1990    Years since quitting: 32.3  . Smokeless tobacco: Never Used  . Tobacco comment: Smoked afternoon cigar  Substance Use Topics  . Alcohol use: No     Current Outpatient Medications:   .  acetaminophen (TYLENOL) 500 MG tablet, Take 500 mg by mouth every 6 (six) hours as needed., Disp: , Rfl:  .  allopurinol (ZYLOPRIM) 100 MG tablet, Take 100 mg by mouth 2 (two) times daily., Disp: , Rfl:  .  amLODipine (NORVASC) 5 MG tablet, TAKE 1 TABLET BY MOUTH  DAILY, Disp: 90 tablet, Rfl: 2 .  Apoaequorin (PREVAGEN PO), Take by mouth., Disp: , Rfl:  .  Ascorbic Acid (VITAMIN C) 1000 MG tablet, Take 1,000 mg by mouth daily., Disp: , Rfl:  .  aspirin EC 81 MG tablet, Take 81 mg by mouth daily., Disp: , Rfl:  .  carvedilol (COREG) 3.125 MG tablet, TAKE 1 TABLET BY MOUTH  TWICE DAILY, Disp: 180 tablet, Rfl: 3 .  Cholecalciferol (VITAMIN D3) 25 MCG (1000 UT) CAPS, Take 1,000 Units by mouth daily., Disp: , Rfl:  .  Coenzyme Q10 (CO Q 10) 100 MG CAPS, Take 100 mg by mouth daily., Disp: , Rfl:  .  hydrochlorothiazide (MICROZIDE) 12.5 MG capsule, TAKE 1 CAPSULE BY MOUTH 3  TIMES WEEKLY, Disp: 36 capsule, Rfl: 3 .  levothyroxine (SYNTHROID) 25 MCG tablet, TAKE 1 TABLET BY MOUTH IN  THE MORNING, Disp: 90 tablet, Rfl: 0 .  liraglutide (VICTOZA) 18 MG/3ML SOPN, Inject 1.2 mg into the skin daily., Disp: 27 mL, Rfl: 1 .  losartan (COZAAR) 25 MG tablet, TAKE 1 TABLET BY MOUTH  DAILY, Disp: 90 tablet, Rfl: 3 .  Multiple Vitamins-Minerals (CENTRUM SILVER PO), Take 200 mg by mouth daily., Disp: , Rfl:  .  Needle, Disp, 32G X 5/16" MISC, 6 mm by Does not apply route 2 (two) times daily., Disp: 100 each, Rfl: 3 .  Omega-3 Fatty Acids (FISH OIL) 1000 MG CAPS, Take 1,000 mg by mouth daily., Disp: , Rfl:  .  rosuvastatin (CRESTOR) 20 MG tablet, Take 1 tablet (20 mg total) by mouth daily., Disp: 90 tablet, Rfl: 1 .  Turmeric 500 MG CAPS, Take 500 mg by mouth daily., Disp: , Rfl:  .  famotidine (PEPCID) 20 MG tablet, TAKE 1 TABLET BY MOUTH  DAILY, Disp: 90 tablet, Rfl: 3  Allergies  Allergen Reactions  . Niacin And Related Itching and Rash    I personally reviewed active problem list, medication list,  allergies, family history, social history, health maintenance with the patient/caregiver today.   ROS  Constitutional: Negative for fever or weight change.  Respiratory: Negative for cough and shortness of breath.   Cardiovascular: Negative for chest pain or palpitations.  Gastrointestinal: Negative for abdominal pain, no bowel changes.  Musculoskeletal: Negative for gait problem or joint swelling.  Skin: Negative for rash.  Neurological: Negative for dizziness or headache.  No other specific complaints in a complete review of systems (except as listed in HPI above).  Objective  Vitals:   06/16/20 0902  BP: 128/82  Pulse: 62  Resp: 16  Temp: 98.1 F (36.7 C)  TempSrc: Oral  SpO2: 97%  Weight: 231 lb (104.8 kg)  Height: 5\' 8"  (1.727 m)    Body mass index is 35.12 kg/m.  Physical Exam  Constitutional: Patient appears well-developed and well-nourished. Obese  No distress.  HEENT: head atraumatic, normocephalic, pupils equal and reactive to light, neck supple Cardiovascular: Normal rate, regular rhythm and normal heart sounds.  No murmur heard. Trace  BLE edema. Pulmonary/Chest: Effort normal and breath sounds normal. No respiratory distress. Abdominal: Soft.  There is no tenderness. Skin: lesion on left inner finger, scab, healing from recent injury at home  Psychiatric: Patient has a normal mood and affect. behavior is normal. Judgment and thought content normal.  Recent Results (from the past 2160 hour(s))  Novel Coronavirus, NAA ([facility])     Status: Abnormal   Collection Time: 03/25/20  5:43 PM   Specimen: Nasopharyngeal(NP) swabs in vial transport medium   Nasopharynge  Result Value Ref Range   SARS-CoV-2, NAA Detected (A) Not Detected    Comment: Patients who have a positive COVID-19 test result may now have treatment options. Treatment options are available for patients with mild to moderate symptoms and for hospitalized patients. Visit our website at  05/23/20 for resources and information. This nucleic acid amplification test was developed and its performance characteristics determined by [employer]. Nucleic acid amplification tests include RT-PCR and TMA. This test has not been FDA cleared or approved. This test has been authorized by  FDA under an Emergency Use Authorization (EUA). This test is only authorized for the duration of time the declaration that circumstances exist justifying the authorization of the emergency use of in vitro diagnostic tests for detection of SARS-CoV-2 virus and/or diagnosis of COVID-19 infection under section 564(b)(1) of the Act, 21 U.S.C. 161WRU-0(A) (1), unless the authorization is terminated or revoked sooner. When diagnostic testing is negativ e, the possibility of a false negative result should be considered in the context of a patient's recent exposures and the presence of clinical signs and symptoms consistent with COVID-19. An individual without symptoms of COVID-19 and who is not shedding SARS-CoV-2 virus would expect to have a negative (not detected) result in this assay.   SARS-COV-2, NAA 2 DAY TAT     Status: None   Collection Time: 03/25/20  5:43 PM   Nasopharynge  Result Value Ref Range   SARS-CoV-2, NAA 2 DAY TAT Performed   ALT     Status: None   Collection Time: 05/19/20  9:29 AM  Result Value Ref Range   ALT 38 0 - 44 U/L    Comment: Performed at [facility], [address] [place], [place] [address]  Lipid panel     Status: Abnormal   Collection Time: 05/19/20  9:29 AM  Result Value Ref Range   Cholesterol 119 0 - 200 mg/dL   Triglycerides 119 (H) <150 mg/dL   HDL 34 (L) >14 mg/dL   Total CHOL/HDL Ratio 3.5 RATIO   VLDL 41 (H) 0 - 40 mg/dL   LDL Cholesterol 44 0 - 99 mg/dL    Comment:        Total Cholesterol/HDL:CHD Risk Coronary Heart Disease Risk Table                     Men   Women  1/2 Average Risk   3.4   3.3  Average  Risk       5.0   4.4  2 X Average Risk   9.6   7.1  3 X Average Risk  23.4   11.0        Use the calculated Patient Ratio above and the CHD Risk Table to determine the patient's CHD Risk.        ATP III CLASSIFICATION (LDL):  <100     mg/dL   Optimal  782-956  mg/dL   Near or Above                    Optimal  130-159  mg/dL   Borderline  213-086  mg/dL   High  >578     mg/dL   Very High Performed at [facility], [address] [place], [place] [address]   POCT HgB A1C     Status: Abnormal   Collection Time: 06/16/20  9:11 AM  Result Value Ref Range   Hemoglobin A1C 6.3 (A) 4.0 - 5.6 %   HbA1c POC (<> result, manual entry)     HbA1c, POC (prediabetic range)     HbA1c, POC (controlled diabetic range)      Diabetic Foot Exam: Diabetic Foot Exam - Simple   Simple Foot Form Visual Inspection See comments: Yes Sensation Testing Intact to touch and monofilament testing bilaterally: Yes Pulse Check Posterior Tibialis and Dorsalis pulse intact bilaterally: Yes Comments Dry skin       PHQ2/9: Depression screen [facility] 2/9 06/16/2020 04/15/2020 02/12/2020 11/13/2019 08/13/2019  Decreased Interest 0 0 0  0 1  Down, Depressed, Hopeless 0 0 0 0 1  PHQ - 2 Score 0 0 0 0 2  Altered sleeping - - - - 1  Tired, decreased energy - - - - 1  Change in appetite - - - - 0  Feeling bad or failure about yourself  - - - - 0  Trouble concentrating - - - - 1  Moving slowly or fidgety/restless - - - - 0  Suicidal thoughts - - - - 0  PHQ-9 Score - - - - 5  Difficult doing work/chores - - - - Somewhat difficult  Some recent data might be hidden    phq 9 is negative   Fall Risk: Fall Risk  06/16/2020 04/15/2020 02/12/2020 11/13/2019 08/13/2019  Falls in the past year? 1 0 1 1 1   Number falls in past yr: 1 1 1  0 0  Comment - - - - -  Injury with Fall? 0 0 0 1 1  Comment - - - - Knee injury  Risk for fall due to : - History of fall(s) - History of fall(s) History of fall(s)  Follow up  - Falls prevention discussed - - -    Functional Status Survey: Is the patient deaf or have difficulty hearing?: No Does the patient have difficulty seeing, even when wearing glasses/contacts?: No Does the patient have difficulty concentrating, remembering, or making decisions?: No Does the patient have difficulty walking or climbing stairs?: No Does the patient have difficulty dressing or bathing?: No Does the patient have difficulty doing errands alone such as visiting a doctor's office or shopping?: No    Assessment & Plan  1. Diabetes mellitus type 2 in obese (HCC)  - POCT HgB A1C - HM Diabetes Foot Exam - Microalbumin / creatinine urine ratio - COMPLETE METABOLIC PANEL WITH GFR  2. Stable angina (HCC)  Doing well at this time  3. Stage 3a chronic kidney disease (HCC)  Keep follow up with Dr.   4. OSA on CPAP  Wearing CPAP daily   5. Essential hypertension  - COMPLETE METABOLIC PANEL WITH GFR - CBC with Differential/Platelet  6. Benign hypertension with chronic kidney disease, stage III (HCC)   7. Chronic heart failure with preserved ejection fraction (HCC)   8. Hypothyroidism due to acquired atrophy of thyroid  - TSH  9. Morbid obesity (HCC)  Discussed with the patient the risk posed by an increased BMI. Discussed importance of portion control, calorie counting and at least 150 minutes of physical activity weekly. Avoid sweet beverages and drink more water. Eat at least 6 servings of fruit and vegetables daily   10. Thrombocytopenia (HCC)  - CBC with Differential/Platelet  11. Neuropathy involving both lower extremities  Doing better, no symptoms lately   12. Dyslipidemia associated with type 2 diabetes mellitus (HCC)   13. Skin excoriation  - Tdap vaccine greater than or equal to 7yo IM  15. Need for Tdap vaccination  - Tdap vaccine greater than or equal to 7yo IM  16. Idiopathic chronic gout of right knee without tophus  - Uric  acid

## 2020-06-12 NOTE — Telephone Encounter (Signed)
Rx request sent to pharmacy.  

## 2020-06-16 ENCOUNTER — Other Ambulatory Visit: Payer: Self-pay

## 2020-06-16 ENCOUNTER — Ambulatory Visit (INDEPENDENT_AMBULATORY_CARE_PROVIDER_SITE_OTHER): Payer: Medicare Other | Admitting: Family Medicine

## 2020-06-16 ENCOUNTER — Encounter: Payer: Self-pay | Admitting: Family Medicine

## 2020-06-16 VITALS — BP 128/82 | HR 62 | Temp 98.1°F | Resp 16 | Ht 68.0 in | Wt 231.0 lb

## 2020-06-16 DIAGNOSIS — N183 Chronic kidney disease, stage 3 unspecified: Secondary | ICD-10-CM

## 2020-06-16 DIAGNOSIS — E034 Atrophy of thyroid (acquired): Secondary | ICD-10-CM | POA: Diagnosis not present

## 2020-06-16 DIAGNOSIS — E669 Obesity, unspecified: Secondary | ICD-10-CM

## 2020-06-16 DIAGNOSIS — I5032 Chronic diastolic (congestive) heart failure: Secondary | ICD-10-CM | POA: Diagnosis not present

## 2020-06-16 DIAGNOSIS — D696 Thrombocytopenia, unspecified: Secondary | ICD-10-CM | POA: Diagnosis not present

## 2020-06-16 DIAGNOSIS — I10 Essential (primary) hypertension: Secondary | ICD-10-CM

## 2020-06-16 DIAGNOSIS — T148XXA Other injury of unspecified body region, initial encounter: Secondary | ICD-10-CM

## 2020-06-16 DIAGNOSIS — E1169 Type 2 diabetes mellitus with other specified complication: Secondary | ICD-10-CM

## 2020-06-16 DIAGNOSIS — Z9989 Dependence on other enabling machines and devices: Secondary | ICD-10-CM

## 2020-06-16 DIAGNOSIS — I129 Hypertensive chronic kidney disease with stage 1 through stage 4 chronic kidney disease, or unspecified chronic kidney disease: Secondary | ICD-10-CM | POA: Diagnosis not present

## 2020-06-16 DIAGNOSIS — I208 Other forms of angina pectoris: Secondary | ICD-10-CM | POA: Diagnosis not present

## 2020-06-16 DIAGNOSIS — G5793 Unspecified mononeuropathy of bilateral lower limbs: Secondary | ICD-10-CM

## 2020-06-16 DIAGNOSIS — N1831 Chronic kidney disease, stage 3a: Secondary | ICD-10-CM

## 2020-06-16 DIAGNOSIS — Z23 Encounter for immunization: Secondary | ICD-10-CM | POA: Diagnosis not present

## 2020-06-16 DIAGNOSIS — G4733 Obstructive sleep apnea (adult) (pediatric): Secondary | ICD-10-CM

## 2020-06-16 DIAGNOSIS — M109 Gout, unspecified: Secondary | ICD-10-CM

## 2020-06-16 DIAGNOSIS — E785 Hyperlipidemia, unspecified: Secondary | ICD-10-CM

## 2020-06-16 LAB — POCT GLYCOSYLATED HEMOGLOBIN (HGB A1C): Hemoglobin A1C: 6.3 % — AB (ref 4.0–5.6)

## 2020-06-17 LAB — URIC ACID: Uric Acid, Serum: 6 mg/dL (ref 4.0–8.0)

## 2020-06-17 LAB — COMPLETE METABOLIC PANEL WITH GFR
AG Ratio: 2.1 (calc) (ref 1.0–2.5)
ALT: 33 U/L (ref 9–46)
AST: 26 U/L (ref 10–35)
Albumin: 4.4 g/dL (ref 3.6–5.1)
Alkaline phosphatase (APISO): 46 U/L (ref 35–144)
BUN/Creatinine Ratio: 11 (calc) (ref 6–22)
BUN: 14 mg/dL (ref 7–25)
CO2: 27 mmol/L (ref 20–32)
Calcium: 9.6 mg/dL (ref 8.6–10.3)
Chloride: 108 mmol/L (ref 98–110)
Creat: 1.24 mg/dL — ABNORMAL HIGH (ref 0.70–1.11)
GFR, Est African American: 63 mL/min/{1.73_m2} (ref >=60)
GFR, Est Non African American: 55 mL/min/{1.73_m2} — ABNORMAL LOW (ref >=60)
Globulin: 2.1 g/dL (calc) (ref 1.9–3.7)
Glucose, Bld: 134 mg/dL — ABNORMAL HIGH (ref 65–99)
Potassium: 4.5 mmol/L (ref 3.5–5.3)
Sodium: 143 mmol/L (ref 135–146)
Total Bilirubin: 1 mg/dL (ref 0.2–1.2)
Total Protein: 6.5 g/dL (ref 6.1–8.1)

## 2020-06-17 LAB — CBC WITH DIFFERENTIAL/PLATELET
Absolute Monocytes: 543 cells/uL (ref 200–950)
Basophils Absolute: 50 cells/uL (ref 0–200)
Basophils Relative: 0.9 %
Eosinophils Absolute: 190 cells/uL (ref 15–500)
Eosinophils Relative: 3.4 %
HCT: 43.4 % (ref 38.5–50.0)
Hemoglobin: 14.9 g/dL (ref 13.2–17.1)
Lymphs Abs: 1652 cells/uL (ref 850–3900)
MCH: 34.3 pg — ABNORMAL HIGH (ref 27.0–33.0)
MCHC: 34.3 g/dL (ref 32.0–36.0)
MCV: 100 fL (ref 80.0–100.0)
MPV: 11.1 fL (ref 7.5–12.5)
Monocytes Relative: 9.7 %
Neutro Abs: 3164 cells/uL (ref 1500–7800)
Neutrophils Relative %: 56.5 %
Platelets: 118 10*3/uL — ABNORMAL LOW (ref 140–400)
RBC: 4.34 10*6/uL (ref 4.20–5.80)
RDW: 13.2 % (ref 11.0–15.0)
Total Lymphocyte: 29.5 %
WBC: 5.6 10*3/uL (ref 3.8–10.8)

## 2020-06-17 LAB — TSH: TSH: 2.32 mIU/L (ref 0.40–4.50)

## 2020-06-17 LAB — MICROALBUMIN / CREATININE URINE RATIO
Creatinine, Urine: 103 mg/dL (ref 20–320)
Microalb Creat Ratio: 21 mcg/mg creat (ref <30)
Microalb, Ur: 2.2 mg/dL

## 2020-07-01 DIAGNOSIS — G4733 Obstructive sleep apnea (adult) (pediatric): Secondary | ICD-10-CM | POA: Diagnosis not present

## 2020-07-01 DIAGNOSIS — I10 Essential (primary) hypertension: Secondary | ICD-10-CM | POA: Diagnosis not present

## 2020-07-15 ENCOUNTER — Telehealth: Payer: Self-pay

## 2020-07-15 NOTE — Progress Notes (Signed)
Unable to leave a voice message due to line is busy to confirmed patient telephone appointment on 07/16/2020 for CCM at 1:00 pm with Angelena Sole the Clinical pharmacist.   Everlean Cherry Clinical Pharmacist Assistant [phone]

## 2020-07-16 ENCOUNTER — Ambulatory Visit (INDEPENDENT_AMBULATORY_CARE_PROVIDER_SITE_OTHER): Payer: Medicare Other

## 2020-07-16 DIAGNOSIS — I5032 Chronic diastolic (congestive) heart failure: Secondary | ICD-10-CM

## 2020-07-16 DIAGNOSIS — E1169 Type 2 diabetes mellitus with other specified complication: Secondary | ICD-10-CM

## 2020-07-16 DIAGNOSIS — I152 Hypertension secondary to endocrine disorders: Secondary | ICD-10-CM | POA: Diagnosis not present

## 2020-07-16 DIAGNOSIS — E1122 Type 2 diabetes mellitus with diabetic chronic kidney disease: Secondary | ICD-10-CM

## 2020-07-16 DIAGNOSIS — E1159 Type 2 diabetes mellitus with other circulatory complications: Secondary | ICD-10-CM | POA: Diagnosis not present

## 2020-07-16 DIAGNOSIS — M109 Gout, unspecified: Secondary | ICD-10-CM

## 2020-07-16 DIAGNOSIS — N1831 Chronic kidney disease, stage 3a: Secondary | ICD-10-CM | POA: Diagnosis not present

## 2020-07-16 DIAGNOSIS — E785 Hyperlipidemia, unspecified: Secondary | ICD-10-CM

## 2020-07-16 NOTE — Progress Notes (Signed)
Chronic Care Management Pharmacy Note  07/16/2020 Name:  Drew [name] MRN:  [id number] DOB:  November 11, 1940  Subjective: Drew [name] is an 80 y.o. year old male who is a primary patient of Steele Sizer, MD.  The CCM team was consulted for assistance with disease management and care coordination needs.    Engaged with patient by telephone for follow up visit in response to provider referral for pharmacy case management and/or care coordination services.   Consent to Services:  The patient was given information about Chronic Care Management services, agreed to services, and gave verbal consent prior to initiation of services.  Please see initial visit note for detailed documentation.   Patient Care Team: Steele Sizer, MD as PCP - General (Family Medicine) End, Harrell Gave, MD as PCP - Cardiology (Cardiology) Murlean Iba, MD (Nephrology) Germaine Pomfret, [facility] as Pharmacist (Pharmacist) Flora Lipps, MD as Consulting Physician (Pulmonary Disease)  Recent office visits: 06/16/20: Patient presened to Dr. Ancil Boozer for follow-up. A1c 6.3%.  04/15/20: Patient presented to Clemetine Marker, LPN for AWV.   Recent consult visits: 04/16/20: Patient presented to Dr. Mortimer Fries (pulmonology) for OSA follow-up. Compliant with CPAP.   04/07/20: Patient presented to Dr. Candiss Norse (Nephrology) for follow-up. BP 99/66,  Amlodipine stopped 02/06/20: Patient presented to Dr. Sharolyn Douglas (Cardiology) for follow-up.   Hospital visits: None in previous 6 months   Objective:  Lab Results  Component Value Date   CREATININE 1.24 (H) 06/16/2020   BUN 14 06/16/2020   GFRNONAA 55 (L) 06/16/2020   GFRAA 63 06/16/2020   NA 143 06/16/2020   K 4.5 06/16/2020   CALCIUM 9.6 06/16/2020   CO2 27 06/16/2020   GLUCOSE 134 (H) 06/16/2020    Lab Results  Component Value Date/Time   HGBA1C 6.3 (A) 06/16/2020 09:11 AM   HGBA1C 6.9 (A) 02/12/2020 08:50 AM   HGBA1C 6.2 (H) 05/04/2019 10:02 AM   HGBA1C 5.9 (H) 08/02/2018  11:14 AM   MICROALBUR 2.2 06/16/2020 10:13 AM   MICROALBUR 2.1 08/02/2018 11:14 AM    Last diabetic Eye exam:  Lab Results  Component Value Date/Time   HMDIABEYEEXA No Retinopathy 06/25/2019 12:00 AM    Last diabetic Foot exam: No results found for: HMDIABFOOTEX   Lab Results  Component Value Date   CHOL 119 05/19/2020   HDL 34 (L) 05/19/2020   LDLCALC 44 05/19/2020   TRIG 205 (H) 05/19/2020   CHOLHDL 3.5 05/19/2020    Hepatic Function Latest Ref Rng & Units 06/16/2020 05/19/2020 02/19/2020  Total Protein 6.1 - 8.1 g/dL 6.5 - -  Albumin 3.6 - 5.1 g/dL - - -  AST 10 - 35 U/L 26 - -  ALT 9 - 46 U/L 33 38 44  Alk Phosphatase 40 - 115 U/L - - -  Total Bilirubin 0.2 - 1.2 mg/dL 1.0 - -    Lab Results  Component Value Date/Time   TSH 2.32 06/16/2020 10:13 AM   TSH 2.57 05/04/2019 10:02 AM    CBC Latest Ref Rng & Units 06/16/2020 05/04/2019 08/30/2017  WBC 3.8 - 10.8 Thousand/uL 5.6 5.6 6.3  Hemoglobin 13.2 - 17.1 g/dL 14.9 15.3 15.4  Hematocrit 38.5 - 50.0 % 43.4 45.5 43.9  Platelets 140 - 400 Thousand/uL 118(L) 137(L) 149    No results found for: VD25OH  Clinical ASCVD: Yes  The ASCVD Risk score Mikey Bussing DC Jr., et al., 2013) failed to calculate for the following reasons:   The 2013 ASCVD risk score is only valid for ages  40 to 41    Depression screen [facility] 2/9 06/16/2020 04/15/2020 02/12/2020  Decreased Interest 0 0 0  Down, Depressed, Hopeless 0 0 0  PHQ - 2 Score 0 0 0  Altered sleeping - - -  Tired, decreased energy - - -  Change in appetite - - -  Feeling bad or failure about yourself  - - -  Trouble concentrating - - -  Moving slowly or fidgety/restless - - -  Suicidal thoughts - - -  PHQ-9 Score - - -  Difficult doing work/chores - - -  Some recent data might be hidden     Social History   Tobacco Use  Smoking Status Former Smoker  . Years: 1.00  . Types: Cigarettes, Cigars  . Quit date: 72  . Years since quitting: 32.4  Smokeless Tobacco Never Used   Tobacco Comment   Smoked afternoon cigar   BP Readings from Last 3 Encounters:  06/16/20 128/82  04/16/20 134/80  02/12/20 138/86   Pulse Readings from Last 3 Encounters:  06/16/20 62  04/16/20 89  02/12/20 63   Wt Readings from Last 3 Encounters:  06/16/20 231 lb (104.8 kg)  04/16/20 229 lb 3.2 oz (104 kg)  02/12/20 227 lb 14.4 oz (103.4 kg)   BMI Readings from Last 3 Encounters:  06/16/20 35.12 kg/m  04/16/20 34.85 kg/m  02/12/20 34.65 kg/m    Assessment/Interventions: Review of patient past medical history, allergies, medications, health status, including review of consultants reports, laboratory and other test data, was performed as part of comprehensive evaluation and provision of chronic care management services.   SDOH:  (Social Determinants of Health) assessments and interventions performed: Yes SDOH Interventions   Flowsheet Row Most Recent Value  SDOH Interventions   Financial Strain Interventions Intervention Not Indicated     SDOH Screenings   Alcohol Screen: Low Risk   . Last Alcohol Screening Score (AUDIT): 0  Depression (PHQ2-9): Low Risk   . PHQ-2 Score: 0  Financial Resource Strain: Low Risk   . Difficulty of Paying Living Expenses: Not hard at all  Food Insecurity: No Food Insecurity  . Worried About Charity fundraiser in the Last Year: Never true  . Ran Out of Food in the Last Year: Never true  Housing: Low Risk   . Last Housing Risk Score: 0  Physical Activity: Inactive  . Days of Exercise per Week: 0 days  . Minutes of Exercise per Session: 0 min  Social Connections: Moderately Integrated  . Frequency of Communication with Friends and Family: More than three times a week  . Frequency of Social Gatherings with Friends and Family: Once a week  . Attends Religious Services: More than 4 times per year  . Active Member of Clubs or Organizations: No  . Attends Archivist Meetings: Never  . Marital Status: Married  Stress: No  Stress Concern Present  . Feeling of Stress : Only a little  Tobacco Use: Medium Risk  . Smoking Tobacco Use: Former Smoker  . Smokeless Tobacco Use: Never Used  Transportation Needs: No Transportation Needs  . Lack of Transportation (Medical): No  . Lack of Transportation (Non-Medical): No    CCM Care Plan  Allergies  Allergen Reactions  . Niacin And Related Itching and Rash    Medications Reviewed Today    Reviewed by Germaine Pomfret, [facility] (Pharmacist) on 07/16/20 at 1343  Med List Status: <None>  Medication Order Taking? Sig Documenting Provider Last Dose Status  Informant  acetaminophen (TYLENOL) 500 MG tablet [id number]  Take 500 mg by mouth every 6 (six) hours as needed. [provider]  Active Self  allopurinol (ZYLOPRIM) 100 MG tablet [id number] Yes Take 100 mg by mouth 2 (two) times daily. [provider] Taking Active Self           Med Note Delynn Flavin Jan 15, 2020 12:34 PM) Prescribed by Dr. Murlean Iba  amLODipine (NORVASC) 5 MG tablet [id number] Yes TAKE 1 TABLET BY MOUTH  DAILY End, Harrell Gave, MD Taking Active   Apoaequorin (PREVAGEN PO) [id number]  Take by mouth. [provider]  Active   Ascorbic Acid (VITAMIN C) 1000 MG tablet [id number]  Take 1,000 mg by mouth daily. [provider]  Active   aspirin EC 81 MG tablet [id number]  Take 81 mg by mouth daily. [provider]  Active Self  carvedilol (COREG) 3.125 MG tablet [id number] Yes TAKE 1 TABLET BY MOUTH  TWICE DAILY End, Christopher, MD Taking Active   Cholecalciferol (VITAMIN D3) 25 MCG (1000 UT) CAPS [id number]  Take 1,000 Units by mouth daily. [provider]  Active Self  Coenzyme Q10 (CO Q 10) 100 MG CAPS [id number]  Take 100 mg by mouth daily. [provider]  Active Self  famotidine (PEPCID) 20 MG tablet [id number]  TAKE 1 TABLET BY MOUTH  DAILY Sowles, Drue Stager, MD  Active   hydrochlorothiazide (MICROZIDE) 12.5 MG capsule [id number]  Yes TAKE 1 CAPSULE BY MOUTH 3  TIMES WEEKLY End, Christopher, MD Taking Active   levothyroxine (SYNTHROID) 25 MCG tablet [id number] Yes TAKE 1 TABLET BY MOUTH IN  THE Dyanne Iha, Drue Stager, MD Taking Active   liraglutide (VICTOZA) 18 MG/3ML SOPN [id number] Yes Inject 1.2 mg into the skin daily. Steele Sizer, MD Taking Active   losartan (COZAAR) 25 MG tablet [id number] Yes TAKE 1 TABLET BY MOUTH  DAILY End, Christopher, MD Taking Active   Multiple Vitamins-Minerals (CENTRUM SILVER PO) [id number]  Take 200 mg by mouth daily. [provider]  Active Self  Needle, Disp, 32G X 5/16" MISC [id number]  6 mm by Does not apply route 2 (two) times daily. Steele Sizer, MD  Active   Omega-3 Fatty Acids (FISH OIL) 1000 MG CAPS [id number]  Take 1,000 mg by mouth daily. [provider]  Active Self  rosuvastatin (CRESTOR) 20 MG tablet [id number] Yes Take 1 tablet (20 mg total) by mouth daily. Steele Sizer, MD Taking Expired 07/07/20 2359   Turmeric 500 MG CAPS [id number]  Take 500 mg by mouth daily. [provider]  Active Self          Patient Active Problem List   Diagnosis Date Noted  . Atrophy of kidney 10/10/2019  . Edema of lower extremity 10/10/2019  . Proteinuria 10/10/2019  . Thrombocytopenia ([place]) 08/13/2019  . Diabetes mellitus type 2 in obese ([place]) 08/13/2019  . Dyslipidemia associated with type 2 diabetes mellitus ([place]) 08/13/2019  . Neuropathy involving both lower extremities 05/04/2019  . Polyneuropathy, unspecified 05/04/2019  . Multiple renal cysts 04/09/2019  . Type II or unspecified type diabetes mellitus with renal manifestations, not stated as uncontrolled 04/09/2019  . Stable angina ([place]) 12/29/2018  . Chest pain 05/29/2018  . Onychomycosis of multiple toenails with type 2 diabetes mellitus ([place]) 11/30/2017  . Chronic heart failure with preserved ejection fraction (HFpEF) ([place]) 10/20/2017  . Heart failure, unspecified ([place]) 10/20/2017  . Diastolic  dysfunction 80/32/1224  . Obstructive sleep apnea 02/10/2017  .  Dyspnea on exertion 10/20/2016  . Chronic fatigue 10/20/2016  . Pain in both lower extremities 10/20/2016  . Arm pain, medial, right 08/27/2016  . Encounter for cardiac risk counseling 02/10/2016  . Light-headedness 11/26/2015  . Abnormal ankle brachial index (ABI) 11/26/2015  . Stage 3a chronic kidney disease ([place]) 08/15/2015  . H/O: upper GI bleed   . Medication monitoring encounter 08/07/2015  . Morbid obesity ([place]) 08/07/2015  . Hypothyroidism due to acquired atrophy of thyroid 03/11/2015  . History of prostatitis 11/06/2014  . Essential hypertension 11/06/2014  . Mixed hyperlipidemia 11/06/2014  . 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 Flat feet, bilateral 11/06/2014  . History of anemia 11/06/2014  . Chronic multiple gastric erosions 11/06/2014  . Scrotal swelling 11/06/2014  . 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 Chronic gout of ankle 08/16/2014  . H/O: GI bleed 12/20/2013  . Personal history of other diseases of the digestive system 12/20/2013    Immunization History  Administered Date(s) Administered  . Fluad Quad(high Dose 65+) 11/13/2019  . Influenza, High Dose Seasonal PF 11/06/2014, 11/24/2015, 11/30/2016, 11/30/2017  . Influenza-Unspecified 12/29/2018  . PFIZER(Purple Top)SARS-COV-2 Vaccination 04/05/2019, 04/30/2019, 12/24/2019  . Pneumococcal Conjugate-13 12/14/2013  . Pneumococcal Polysaccharide-23 10/02/2009  . Tdap 06/16/2020    Conditions to be addressed/monitored:  Hypertension, Hyperlipidemia, Diabetes, Heart Failure, Coronary Artery Disease, GERD, Chronic Kidney Disease and Gout  Care Plan : General Pharmacy (Adult)  Updates made by Germaine Pomfret, RPH since 07/16/2020 12:00 AM    Problem: Hypertension, Hyperlipidemia, Diabetes, Heart Failure, Coronary Artery Disease, GERD, Chronic Kidney Disease and Gout    Priority: High    Long-Range Goal: Patient-Specific Goal   Start Date: 07/16/2020  Expected End Date: 01/16/2021  This Visit's Progress: On track  Priority: High  Note:   Current Barriers:  . Unable to independently afford treatment regimen  Pharmacist Clinical Goal(s):  Marland Kitchen Patient will verbalize ability to afford treatment regimen . maintain control of diabetes as evidenced by A1c less than 8%  . maintain control of heart failure as evidenced by stable weight, lack of hospitalizations through collaboration with PharmD and provider.   Interventions: . 1:1 collaboration with Steele Sizer, MD regarding development and update of comprehensive plan of care as evidenced by provider attestation and co-signature . Inter-disciplinary care team collaboration (see longitudinal plan of care) . Comprehensive medication review performed; medication list updated in electronic medical record  Diabetes (A1c goal <8%) -Controlled -Current medications: . Victoza 1.2 mg daily  -Medications previously tried: Metformin XR   -Current home glucose readings . fasting glucose: 130-175 typical range, 160  -Denies hypoglycemic/hyperglycemic symptoms -Current meal patterns: does not follow regular dietary regimen -Current exercise: Walking through garden, with dog.  -Patient recently left for vacation, left Victoza pens and blood sugar monitor at home. Counseled patient Victoza pens can be stored at room temperature for 30 days while using the medication and to pack his currently in-use pen with him on trips. -Educated on Benefits of routine self-monitoring of blood sugar; -Counseled to get yearly eye exams -Recommended to continue current medication  Heart Failure (Goal: manage symptoms and prevent exacerbations) -Controlled  -Managed by Dr. Saunders Revel -Last ejection fraction: WNL (Date: Jan 2018) -HF type: Diastolic -NYHA Class: I (no actitivty limitation) -AHA HF Stage: B (Heart disease present - no  symptoms present) -Current treatment: . Amlodipine 5 mg daily  . Carvedilol 3.125 mg twice daily  . HCTZ 12.5 mg  three times weekly  . Losartan 25 mg daily  -Medications previously tried: NA  -Current home BP/HR readings: 122/68 Pulse 67, 114/78 pulse 62, 108/64 pulse 76  -One episode of chest tightness one month prior when working in his garden. Resolved when he went inside and rested.  -Able to walk from house to road (3000 ft) before significant dyspnea  -Educated on Importance of weighing daily; if you gain more than 3 pounds in one day or 5 pounds in one week, contact cardiology office -Recommended to continue current medication  Hyperlipidemia: (LDL goal < 70) -Controlled -Current treatment: . Rosuvastatin 20 mg daily  -Current antiplatelet treatment: . Aspirin 81 mg daily  -Medications previously tried: NA -Educated on Importance of limiting foods high in cholesterol; -Recommended to continue current medication  Hypothyroidism (Goal: Maintain stable thyroid function) -Controlled -Current treatment  . Levothyroxine 25 mcg daily  -Medications previously tried: NA  -Recommended to continue current medication  Gout (Goal: Prevent gout flares) -Controlled  -Managed by Dr. Murlean Iba -Current treatment  . Allopurinol 100 mg twice daily  -Medications previously tried: NA  -Counseled patient on low purine diet plan. Counseled patient to reduce consumption of high-fructose corn syrup, sweetened soft drinks, fruit juices, meat, and seafood. -Recommended to continue current medication  GERD (Goal: Prevent heartburn/reflux) -Controlled -Current treatment  . Famotidine 20 mg daily  -Medications previously tried: NA  -Recommended to continue current medication  Patient Goals/Self-Care Activities . Patient will:  - check glucose daily, document, and provide at future appointments check blood pressure twice weekly, document, and provide at future appointments weigh daily,  and contact provider if weight gain of greater than 2 pounds  Follow Up Plan: Telephone follow up appointment with care management team member scheduled for:  09/18/2020 at 12:30 PM      Medication Assistance: Victoza obtained through [name] medication assistance program.  Enrollment ends Dec 2022  Compliance/Adherence/Medication fill history: Care Gaps: Ophthalmology Exam  Star-Rating Drugs: No fill history available  Patient's preferred pharmacy is:  [facility], [address], [address] [place], [place] [address] Phone: [phone] Fax: [facility] [address], [place] [address] Phone: [phone] Fax: [phone]  Uses pill box? Yes Pt endorses 100% compliance  We discussed: Current pharmacy is preferred with insurance plan and patient is satisfied with pharmacy services Patient decided to: Continue current medication management strategy  Care Plan and Follow Up Patient Decision:  Patient agrees to Care Plan and Follow-up.  Plan: Telephone follow up appointment with care management team member scheduled for:  09/18/2020 at 12:30 PM  Doristine Section, [facility] [phone]

## 2020-07-16 NOTE — Patient Instructions (Signed)
Visit Information It was great speaking with you today!  Please let me know if you have any questions about our visit.  Goals Addressed            This Visit's Progress   . Monitor and Manage My Blood Sugar-Diabetes Type 2       Timeframe:  Long-Range Goal Priority:  High Start Date: 07/16/2020                             Expected End Date:  01/16/2021                      Follow Up Date 09/18/2020   -check blood sugar daily - check blood sugar if I feel it is too high or too low - enter blood sugar readings and medication or insulin into daily log - take the blood sugar log to all doctor visits    Why is this important?    Checking your blood sugar at home helps to keep it from getting very high or very low.   Writing the results in a diary or log helps the doctor know how to care for you.   Your blood sugar log should have the time, date and the results.   Also, write down the amount of insulin or other medicine that you take.   Other information, like what you ate, exercise done and how you were feeling, will also be helpful.     Notes:     . Track and Manage Fluids and Swelling-Heart Failure       Timeframe:  Long-Range Goal Priority:  High Start Date:  07/16/2020                           Expected End Date: 01/16/2021                      Follow Up Date 08/06/2020   - call office if I gain more than 2 pounds in one day or 5 pounds in one week - track weight in diary - use salt in moderation - watch for swelling in feet, ankles and legs every day - weigh myself daily    Why is this important?    It is important to check your weight daily and watch how much salt and liquids you have.   It will help you to manage your heart failure.    Notes:        Patient Care Plan: General Pharmacy (Adult)    Problem Identified: Hypertension, Hyperlipidemia, Diabetes, Heart Failure, Coronary Artery Disease, GERD, Chronic Kidney Disease and Gout   Priority: High     Long-Range Goal: Patient-Specific Goal   Start Date: 07/16/2020  Expected End Date: 01/16/2021  This Visit's Progress: On track  Priority: High  Note:   Current Barriers:  . Unable to independently afford treatment regimen  Pharmacist Clinical Goal(s):  Marland Kitchen Patient will verbalize ability to afford treatment regimen . maintain control of diabetes as evidenced by A1c less than 8%  . maintain control of heart failure as evidenced by stable weight, lack of hospitalizations through collaboration with PharmD and provider.   Interventions: . 1:1 collaboration with Alba Cory, MD regarding development and update of comprehensive plan of care as evidenced by provider attestation and co-signature . Inter-disciplinary care team collaboration (see longitudinal plan of care) . Comprehensive medication  review performed; medication list updated in electronic medical record  Diabetes (A1c goal <8%) -Controlled -Current medications: . Victoza 1.2 mg daily  -Medications previously tried: Metformin XR   -Current home glucose readings . fasting glucose: 130-175 typical range, 160  -Denies hypoglycemic/hyperglycemic symptoms -Current meal patterns: does not follow regular dietary regimen -Current exercise: Walking through garden, with dog.  -Patient recently left for vacation, left Victoza pens and blood sugar monitor at home. Counseled patient Victoza pens can be stored at room temperature for 30 days while using the medication and to pack his currently in-use pen with him on trips. -Educated on Benefits of routine self-monitoring of blood sugar; -Counseled to get yearly eye exams -Recommended to continue current medication  Heart Failure (Goal: manage symptoms and prevent exacerbations) -Controlled  -Managed by Dr. Okey Dupre -Last ejection fraction: WNL (Date: Jan 2018) -HF type: Diastolic -NYHA Class: I (no actitivty limitation) -AHA HF Stage: B (Heart disease present - no symptoms  present) -Current treatment: . Amlodipine 5 mg daily  . Carvedilol 3.125 mg twice daily  . HCTZ 12.5 mg three times weekly  . Losartan 25 mg daily  -Medications previously tried: NA  -Current home BP/HR readings: 122/68 Pulse 67, 114/78 pulse 62, 108/64 pulse 76  -One episode of chest tightness one month prior when working in his garden. Resolved when he went inside and rested.  -Able to walk from house to road (3000 ft) before significant dyspnea  -Educated on Importance of weighing daily; if you gain more than 3 pounds in one day or 5 pounds in one week, contact cardiology office -Recommended to continue current medication  Hyperlipidemia: (LDL goal < 70) -Controlled -Current treatment: . Rosuvastatin 20 mg daily  -Current antiplatelet treatment: . Aspirin 81 mg daily  -Medications previously tried: NA -Educated on Importance of limiting foods high in cholesterol; -Recommended to continue current medication  Hypothyroidism (Goal: Maintain stable thyroid function) -Controlled -Current treatment  . Levothyroxine 25 mcg daily  -Medications previously tried: NA  -Recommended to continue current medication  Gout (Goal: Prevent gout flares) -Controlled  -Managed by Dr. Mosetta Pigeon -Current treatment  . Allopurinol 100 mg twice daily  -Medications previously tried: NA  -Counseled patient on low purine diet plan. Counseled patient to reduce consumption of high-fructose corn syrup, sweetened soft drinks, fruit juices, meat, and seafood. -Recommended to continue current medication  GERD (Goal: Prevent heartburn/reflux) -Controlled -Current treatment  . Famotidine 20 mg daily  -Medications previously tried: NA  -Recommended to continue current medication  Patient Goals/Self-Care Activities . Patient will:  - check glucose daily, document, and provide at future appointments check blood pressure twice weekly, document, and provide at future appointments weigh daily, and  contact provider if weight gain of greater than 2 pounds  Follow Up Plan: Telephone follow up appointment with care management team member scheduled for:  09/18/2020 at 12:30 PM      Patient agreed to services and verbal consent obtained.   The patient verbalized understanding of instructions, educational materials, and care plan provided today and declined offer to receive copy of patient instructions, educational materials, and care plan.   Garey Ham, CPP Clinical Pharmacist [facility] [phone]

## 2020-08-07 DIAGNOSIS — E1129 Type 2 diabetes mellitus with other diabetic kidney complication: Secondary | ICD-10-CM | POA: Diagnosis not present

## 2020-08-07 DIAGNOSIS — I10 Essential (primary) hypertension: Secondary | ICD-10-CM | POA: Diagnosis not present

## 2020-08-07 DIAGNOSIS — N1831 Chronic kidney disease, stage 3a: Secondary | ICD-10-CM | POA: Diagnosis not present

## 2020-08-12 ENCOUNTER — Other Ambulatory Visit: Payer: Self-pay | Admitting: Family Medicine

## 2020-08-12 DIAGNOSIS — N1831 Chronic kidney disease, stage 3a: Secondary | ICD-10-CM | POA: Diagnosis not present

## 2020-08-12 DIAGNOSIS — E034 Atrophy of thyroid (acquired): Secondary | ICD-10-CM

## 2020-08-12 DIAGNOSIS — E1129 Type 2 diabetes mellitus with other diabetic kidney complication: Secondary | ICD-10-CM | POA: Diagnosis not present

## 2020-08-12 DIAGNOSIS — N281 Cyst of kidney, acquired: Secondary | ICD-10-CM | POA: Diagnosis not present

## 2020-08-12 DIAGNOSIS — I10 Essential (primary) hypertension: Secondary | ICD-10-CM | POA: Diagnosis not present

## 2020-08-13 ENCOUNTER — Encounter: Payer: Self-pay | Admitting: Internal Medicine

## 2020-08-13 ENCOUNTER — Other Ambulatory Visit: Payer: Self-pay

## 2020-08-13 ENCOUNTER — Ambulatory Visit: Payer: Medicare Other | Admitting: Internal Medicine

## 2020-08-13 VITALS — BP 114/72 | HR 57 | Ht 68.0 in | Wt 232.0 lb

## 2020-08-13 DIAGNOSIS — I10 Essential (primary) hypertension: Secondary | ICD-10-CM

## 2020-08-13 DIAGNOSIS — I700 Atherosclerosis of aorta: Secondary | ICD-10-CM

## 2020-08-13 DIAGNOSIS — I25118 Atherosclerotic heart disease of native coronary artery with other forms of angina pectoris: Secondary | ICD-10-CM

## 2020-08-13 DIAGNOSIS — I5032 Chronic diastolic (congestive) heart failure: Secondary | ICD-10-CM | POA: Diagnosis not present

## 2020-08-13 DIAGNOSIS — E1169 Type 2 diabetes mellitus with other specified complication: Secondary | ICD-10-CM | POA: Diagnosis not present

## 2020-08-13 DIAGNOSIS — E785 Hyperlipidemia, unspecified: Secondary | ICD-10-CM

## 2020-08-13 NOTE — Progress Notes (Signed)
Follow-up Outpatient Visit Date: 08/13/2020  Primary Care Provider: Alba Cory, MD [address] [place] [address]  Chief Complaint: Follow-up HFpEF  HPI:  Drew [name] is a 80 y.o. male with history of chronic HFpEF, hypertension, hyperlipidemia, chronic kidney disease stage III, GERD, and obesity, who presents for follow-up of HFpEF.  He was last seen in our office in 01/2020 by Ward Givens, NP, for follow-up of coronary CTA results.  This showed extensive coronary artery calcification with moderate multivessel disease that was not significant by CT FFR.  Incidental note was made of a moderate sized hiatal hernia.  He continues to complain of exertional dyspnea that he attributed to deconditioning.  No medication changes or additional testing were recommended.  Today, Drew [name] reports that his breathing is better than at prior visits.  He has been trying to push himself and feels like increased activity is helping his exertional dyspnea and stamina.  He still gets out of breath sometimes with strenuous activities.  He denies chest pain and palpitations.  He has some mild edema from time to time, which has been chronic.  He also has occasional lightheadedness especially when standing up.  He notes that his home blood pressures are low normal at times.  He notes falling in March because his feet got "tangled up."  He did not have any significant injuries.  --------------------------------------------------------------------------------------------------  Cardiovascular History & Procedures: Cardiovascular Problems: Dyspnea on exertion and chest pain Leg pain   Risk Factors: Hypertension, hyperlipidemia, diabetes mellitus, male gender, and age > 76   Cath/PCI: None   CV Surgery: None   EP Procedures and Devices: None   Non-Invasive Evaluation(s): Coronary CTA (01/10/2020): High coronary calcium score (02/28/2000; 79th percentile).  LMCA normal.  LAD with 25-50%  calcified plaque in the proximal and mid segments.  Normal LCx.  Dominant RCA with 50-69% proximal and 25-49% mid vessel disease.  CT FFR was negative.  Moderate size hiatal hernia and aortic atherosclerosis were also noted. Pharmacologic MPI (06/09/16): Low risk study without ischemia. Normal wall motion with LVEF of 72%. TTE (03/17/16): Normal LV size percent. There is grade 1 diastolic dysfunction. Aortic valve is moderately thickened without stenosis. There is mild aortic regurgitation. Normal RV size and function. Lower extremity arterial vascular study (12/22/15): ABIs right 1.5, left 1.6. TBIs right 1.3, left 1.1.  Recent CV Pertinent Labs: Lab Results  Component Value Date   CHOL 119 05/19/2020   HDL 34 (L) 05/19/2020   LDLCALC 44 05/19/2020   LDLCALC 85 05/04/2019   TRIG 205 (H) 05/19/2020   CHOLHDL 3.5 05/19/2020   INR 1.32 12/22/2013   K 4.5 06/16/2020   MG 1.9 12/21/2013   BUN 14 06/16/2020   CREATININE 1.24 (H) 06/16/2020    Past medical and surgical history were reviewed and updated in EPIC.  Current Meds  Medication Sig   acetaminophen (TYLENOL) 500 MG tablet Take 500 mg by mouth every 6 (six) hours as needed.   allopurinol (ZYLOPRIM) 100 MG tablet Take 100 mg by mouth 2 (two) times daily.   amLODipine (NORVASC) 5 MG tablet TAKE 1 TABLET BY MOUTH  DAILY   Apoaequorin (PREVAGEN PO) Take by mouth.   Ascorbic Acid (VITAMIN C) 1000 MG tablet Take 1,000 mg by mouth daily.   aspirin EC 81 MG tablet Take 81 mg by mouth daily.   carvedilol (COREG) 3.125 MG tablet TAKE 1 TABLET BY MOUTH  TWICE DAILY   Cholecalciferol (VITAMIN D3) 25 MCG (1000  UT) CAPS Take 1,000 Units by mouth daily.   Coenzyme Q10 (CO Q 10) 100 MG CAPS Take 100 mg by mouth daily.   famotidine (PEPCID) 20 MG tablet TAKE 1 TABLET BY MOUTH  DAILY   levothyroxine (SYNTHROID) 25 MCG tablet TAKE 1 TABLET BY MOUTH IN  THE MORNING   liraglutide (VICTOZA) 18 MG/3ML SOPN Inject 1.2 mg into the skin daily.   losartan  (COZAAR) 25 MG tablet TAKE 1 TABLET BY MOUTH  DAILY   Multiple Vitamins-Minerals (CENTRUM SILVER PO) Take 200 mg by mouth daily.   Needle, Disp, 32G X 5/16" MISC 6 mm by Does not apply route 2 (two) times daily.   Omega-3 Fatty Acids (FISH OIL) 1000 MG CAPS Take 1,000 mg by mouth daily.   rosuvastatin (CRESTOR) 20 MG tablet Take 1 tablet (20 mg total) by mouth daily.   Turmeric 500 MG CAPS Take 500 mg by mouth daily.   [DISCONTINUED] hydrochlorothiazide (MICROZIDE) 12.5 MG capsule TAKE 1 CAPSULE BY MOUTH 3  TIMES WEEKLY    Allergies: Niacin and related  Social History   Tobacco Use   Smoking status: Former    Years: 1.00    Pack years: 0.00    Types: Cigarettes, Cigars    Quit date: 1990    Years since quitting: 32.4   Smokeless tobacco: Never   Tobacco comments:    Smoked afternoon cigar  Vaping Use   Vaping Use: Never used  Substance Use Topics   Alcohol use: No   Drug use: No    Family History  Problem Relation Age of Onset   Hyperlipidemia Father    Kidney disease Father    Heart disease Father    Congestive Heart Failure Mother    Heart disease Mother    Hyperlipidemia Daughter    Hyperlipidemia Son    Diabetes Son    Anxiety disorder Daughter    Depression Daughter    Arthritis Daughter     Review of Systems: A 12-system review of systems was performed and was negative except as noted in the HPI.  --------------------------------------------------------------------------------------------------  Physical Exam: BP 114/72 (BP Location: Left Arm, Patient Position: Sitting, Cuff Size: Large)   Pulse (!) 57   Ht 5\' 8"  (1.727 m)   Wt 232 lb (105.2 kg)   SpO2 95%   BMI 35.28 kg/m   General:  NAD. Neck: No JVD or HJR. Lungs: Mildly diminished breath sounds throughout without wheezes or crackles. Heart: Bradycardic but regular without murmurs, rubs, or gallops. Abdomen: Soft, nontender, nondistended. Extremities: No lower extremity edema.  EKG: Sinus  bradycardia with left axis deviation.  No significant change from prior tracing on 02/06/2020.  Lab Results  Component Value Date   WBC 5.6 06/16/2020   HGB 14.9 06/16/2020   HCT 43.4 06/16/2020   MCV 100.0 06/16/2020   PLT 118 (L) 06/16/2020    Lab Results  Component Value Date   NA 143 06/16/2020   K 4.5 06/16/2020   CL 108 06/16/2020   CO2 27 06/16/2020   BUN 14 06/16/2020   CREATININE 1.24 (H) 06/16/2020   GLUCOSE 134 (H) 06/16/2020   ALT 33 06/16/2020    Lab Results  Component Value Date   CHOL 119 05/19/2020   HDL 34 (L) 05/19/2020   LDLCALC 44 05/19/2020   TRIG 205 (H) 05/19/2020   CHOLHDL 3.5 05/19/2020    --------------------------------------------------------------------------------------------------  ASSESSMENT AND PLAN: Chronic HFpEF: Drew [name] appears euvolemic with improved dyspnea on exertion from prior visits,  likely related to improved conditioning.  He reports NYHA class II symptoms.  We will defer additional testing or intervention at this time.  Continue current regimen of carvedilol, losartan, and and amlodipine.  Hypertension: Blood pressure well controlled today.  Given intermittent borderline low blood pressure readings at home as well as orthostatic lightheadedness, we have agreed to discontinue HCTZ.  Coronary artery disease with stable angina, aortic atherosclerosis, and hyperlipidemia associated with type 2 diabetes mellitus: No angina reported on current antianginal regimen consisting of carvedilol and amlodipine.  Coronary CTA last year showed heavy coronary artery calcification as well as aortic atherosclerosis but no hemodynamically significant coronary stenosis.  We will continue aspirin and statin therapy for target LDL less than 70.  Continue lifestyle modifications to help improve triglycerides.  Follow-up: Return to clinic in 6 months.  Yvonne Kendall, MD 08/14/2020 4:13 PM

## 2020-08-13 NOTE — Patient Instructions (Signed)
Medication Instructions:   Your physician has recommended you make the following change in your medication:   STOP Hydrochlorothiazide (HCTZ)     - Please monitor your blood pressure periodically and if BP consistently greater than 140/90 let us know  *If you need a refill on your cardiac medications before your next appointment, please call your pharmacy*   Lab Work:  None ordered  Testing/Procedures:  None ordered   Follow-Up: At [facility], you and your health needs are our priority.  As part of our continuing mission to provide you with exceptional heart care, we have created designated Provider Care Teams.  These Care Teams include your primary Cardiologist (physician) and Advanced Practice Providers (APPs -  Physician Assistants and Nurse Practitioners) who all work together to provide you with the care you need, when you need it.  We recommend signing up for the patient portal called "MyChart".  Sign up information is provided on this After Visit Summary.  MyChart is used to connect with patients for Virtual Visits (Telemedicine).  Patients are able to view lab/test results, encounter notes, upcoming appointments, etc.  Non-urgent messages can be sent to your provider as well.   To learn more about what you can do with MyChart, go to [email or web address].    Your next appointment:   6 month(s)  The format for your next appointment:   In Person  Provider:   You may see Yvonne Kendall, MD or one of the following Advanced Practice Providers on your designated Care Team:   Nicolasa Ducking, NP Eula Listen, PA-C Marisue Ivan, PA-C [place], [place] Gillian Shields, NP

## 2020-08-14 ENCOUNTER — Other Ambulatory Visit: Payer: Self-pay | Admitting: Family Medicine

## 2020-08-14 ENCOUNTER — Ambulatory Visit: Payer: Medicare Other | Admitting: Internal Medicine

## 2020-08-14 ENCOUNTER — Encounter: Payer: Self-pay | Admitting: Internal Medicine

## 2020-08-14 DIAGNOSIS — I25118 Atherosclerotic heart disease of native coronary artery with other forms of angina pectoris: Secondary | ICD-10-CM | POA: Insufficient documentation

## 2020-08-14 DIAGNOSIS — I700 Atherosclerosis of aorta: Secondary | ICD-10-CM | POA: Insufficient documentation

## 2020-08-14 DIAGNOSIS — E034 Atrophy of thyroid (acquired): Secondary | ICD-10-CM

## 2020-08-15 ENCOUNTER — Telehealth: Payer: Self-pay

## 2020-08-15 NOTE — Progress Notes (Signed)
Chronic Care Management Pharmacy Assistant   Name: Drew [name]  MRN: [id number] DOB: 1940/03/19  Reason for Encounter:Hypertension and Heart Failure  Disease State Call.   Recent office visits:  No recent Office Visit  Recent consult visits:  08/13/2020 Dr. END ME (Cardiology)  discontinue HCTZ 08/12/2020 Dr. Thedore Mins MD (Nephrology)   Hospital visits:  None in previous 6 months  Medications: Outpatient Encounter Medications as of 08/15/2020  Medication Sig   acetaminophen (TYLENOL) 500 MG tablet Take 500 mg by mouth every 6 (six) hours as needed.   allopurinol (ZYLOPRIM) 100 MG tablet Take 100 mg by mouth 2 (two) times daily.   amLODipine (NORVASC) 5 MG tablet TAKE 1 TABLET BY MOUTH  DAILY   Apoaequorin (PREVAGEN PO) Take by mouth.   Ascorbic Acid (VITAMIN C) 1000 MG tablet Take 1,000 mg by mouth daily.   aspirin EC 81 MG tablet Take 81 mg by mouth daily.   carvedilol (COREG) 3.125 MG tablet TAKE 1 TABLET BY MOUTH  TWICE DAILY   Cholecalciferol (VITAMIN D3) 25 MCG (1000 UT) CAPS Take 1,000 Units by mouth daily.   Coenzyme Q10 (CO Q 10) 100 MG CAPS Take 100 mg by mouth daily.   famotidine (PEPCID) 20 MG tablet TAKE 1 TABLET BY MOUTH  DAILY   levothyroxine (SYNTHROID) 25 MCG tablet TAKE 1 TABLET BY MOUTH IN  THE MORNING   liraglutide (VICTOZA) 18 MG/3ML SOPN Inject 1.2 mg into the skin daily.   losartan (COZAAR) 25 MG tablet TAKE 1 TABLET BY MOUTH  DAILY   Multiple Vitamins-Minerals (CENTRUM SILVER PO) Take 200 mg by mouth daily.   Needle, Disp, 32G X 5/16" MISC 6 mm by Does not apply route 2 (two) times daily.   Omega-3 Fatty Acids (FISH OIL) 1000 MG CAPS Take 1,000 mg by mouth daily.   rosuvastatin (CRESTOR) 20 MG tablet Take 1 tablet (20 mg total) by mouth daily.   Turmeric 500 MG CAPS Take 500 mg by mouth daily.   No facility-administered encounter medications on file as of 08/15/2020.    Care Gaps: None ID Star Rating Drugs: Losartan 25 mg last filled on 10/18/2019  for 90 day supply at [facility]. Rosuvastatin 20 mg last filled on 01/11/2020 for 90 day supply at [facility] Victoza 18 mg last filled on 11/11/2019 for 90 day supply at [facility].  Reviewed chart prior to disease state call. Spoke with patient regarding BP  Recent Office Vitals: BP Readings from Last 3 Encounters:  08/13/20 114/72  06/16/20 128/82  04/16/20 134/80   Pulse Readings from Last 3 Encounters:  08/13/20 (!) 57  06/16/20 62  04/16/20 89    Wt Readings from Last 3 Encounters:  08/13/20 232 lb (105.2 kg)  06/16/20 231 lb (104.8 kg)  04/16/20 229 lb 3.2 oz (104 kg)     Kidney Function Lab Results  Component Value Date/Time   CREATININE 1.24 (H) 06/16/2020 10:13 AM   CREATININE 1.32 (H) 01/08/2020 01:56 PM   CREATININE 1.22 (H) 05/04/2019 10:02 AM   GFRNONAA 55 (L) 06/16/2020 10:13 AM   GFRAA 63 06/16/2020 10:13 AM    BMP Latest Ref Rng & Units 06/16/2020 01/08/2020 05/04/2019  Glucose 65 - 99 mg/dL 443(X) 540(G) 867(Y)  BUN 7 - 25 mg/dL 14 17 14   Creatinine 0.70 - 1.11 mg/dL ) 1.95(K) 9.32(I)  BUN/Creat Ratio 6 - 22 (calc) 11 - 11  Sodium 135 - 146 mmol/L 143 138 143  Potassium 3.5 - 5.3  mmol/L 4.5 4.1 4.1  Chloride 98 - 110 mmol/L 108 104 108  CO2 20 - 32 mmol/L 27 23 24   Calcium 8.6 - 10.3 mg/dL 9.6 9.6 9.6    Current antihypertensive and Heart Failure regimen:  Amlodipine 5 mg daily Carvedilol 3.125 mg twice daily Losartan 25 mg daily How often are you checking your Blood Pressure? daily Current home BP readings:    On 08/15/2020 it was 150/72 with a pulse of 68. On 08/11/2020 it was 103/56 with a pulse of 66. On 08/09/2020 it was 125/71 with a pulse of 62. On 08/08/2020 it was 150/72 with a pulse of 68.  What recent interventions/DTPs have been made by any provider to improve Blood Pressure control since last CPP Visit:  08/13/2020 Dr. END ME (Cardiology)  discontinue HCTZ Any recent hospitalizations or ED visits since last  visit with CPP? No What diet changes have been made to improve Blood Pressure Control?  Patient states he enjoys tomatoes sandwich. What exercise is being done to improve your Blood Pressure Control?  Patient states he walks to his garden and back up to his house around 300 feet,  Per clinical pharmacist, asked patient to monitor weights daily at last CPP visit, go through recent weights with patient.  Patient states he forgot to check his weight, but will start and have it available next month when I reach out to him.Patient reports his pants are tighter. Exercise Tolerance - How far is patient able to walk before he gets short of breath? Patient states if he walks to his garden and around his house he is short out of breath.  Adherence Review: Is the patient currently on ACE/ARB medication? Yes Does the patient have >5 day gap between last estimated fill dates? Yes   08/15/2020 Clinical Pharmacist Assistant [phone]

## 2020-09-10 ENCOUNTER — Other Ambulatory Visit: Payer: Self-pay | Admitting: Family Medicine

## 2020-09-10 NOTE — Telephone Encounter (Signed)
Requested Prescriptions  Pending Prescriptions Disp Refills  . rosuvastatin (CRESTOR) 20 MG tablet [Pharmacy Med Name: Rosuvastatin Calcium 20 MG Oral Tablet] 90 tablet 2    Sig: TAKE 1 TABLET BY MOUTH  DAILY     Cardiovascular:  Antilipid - Statins Failed - 09/10/2020 10:52 PM      Failed - HDL in normal range and within 360 days    HDL  Date Value Ref Range Status  05/19/2020 34 (L) >40 mg/dL Final         Failed - Triglycerides in normal range and within 360 days    Triglycerides  Date Value Ref Range Status  05/19/2020 205 (H) <150 mg/dL Final         Passed - Total Cholesterol in normal range and within 360 days    Cholesterol  Date Value Ref Range Status  05/19/2020 119 0 - 200 mg/dL Final         Passed - LDL in normal range and within 360 days    LDL Cholesterol (Calc)  Date Value Ref Range Status  05/04/2019 85 mg/dL (calc) Final    Comment:    Reference range: <100 . Desirable range <100 mg/dL for primary prevention;   <70 mg/dL for patients with CHD or diabetic patients  with > or = 2 CHD risk factors. Marland Kitchen LDL-C is now calculated using the Martin-Hopkins  calculation, which is a validated novel method providing  better accuracy than the Friedewald equation in the  estimation of LDL-C.  Horald Pollen et al. Lenox Ahr. 1093;235(57): 2061-2068  (http://education.[employer].com/faq/FAQ164)    LDL Cholesterol  Date Value Ref Range Status  05/19/2020 44 0 - 99 mg/dL Final    Comment:           Total Cholesterol/HDL:CHD Risk Coronary Heart Disease Risk Table                     Men   Women  1/2 Average Risk   3.4   3.3  Average Risk       5.0   4.4  2 X Average Risk   9.6   7.1  3 X Average Risk  23.4   11.0        Use the calculated Patient Ratio above and the CHD Risk Table to determine the patient's CHD Risk.        ATP III CLASSIFICATION (LDL):  <100     mg/dL   Optimal  322-025  mg/dL   Near or Above                    Optimal  130-159  mg/dL    Borderline  427-062  mg/dL   High  >376     mg/dL   Very High Performed at [facility], [address] [place], [place] [address]          Passed - Patient is not pregnant      Passed - Valid encounter within last 12 months    Recent Outpatient Visits          2 months ago Diabetes mellitus type 2 in obese [facility])   [facility] Alba Cory, MD   7 months ago Diabetes mellitus type 2 in obese [facility])   [facility] Alba Cory, MD   10 months ago Diabetes mellitus type 2 in obese [facility])   [facility] Alba Cory, MD   1 year ago Benign  hypertension with chronic kidney disease, stage III   [facility] Alba Cory, MD   1 year ago Hypothyroidism due to acquired atrophy of thyroid   [facility] Danelle Berry, PA-C      Future Appointments            In 1 month Carlynn Purl, Danna Hefty, MD [facility]   In 7 months  [facility], [facility]

## 2020-09-17 ENCOUNTER — Telehealth: Payer: Self-pay

## 2020-09-17 NOTE — Progress Notes (Signed)
Spoke to patient to confirmed patient telephone appointment on 09/18/2020 for CCM at 12:30 pm  with Angelena Sole the Clinical pharmacist.   Patient asking if we can move his appointment to a later time  because he has to take his sister to a doctor appointment.I moved patient appointment to 3:30 pm on 09/18/2020.Patient verbalized understanding and agreement.Patient aware to have all medications, supplements, blood pressure and blood sugar logs to visit.  Questions: Have you had any recent office visit or specialist visit outside of [facility]?No Are there any concerns you would like to discuss during your office visit?No Are you having any problems obtaining your medications? No If patient has any PAP medications ask if they are having any problems getting their PAP medication or refill?No  Care Gaps: Shingrix Vaccine COVID-19 Vaccine Ophthalmology Star Rating Drug: Losartan 25 mg last filled on 03/26/2020 for 90 day supply at [facility]. Rosuvastatin 20 mg last filled on 04/23/2020 for 90 day supply at [facility].; Victoza 18 mg last filled on 03/26/2020 for 90 day supply at [facility].  Any gaps in medications fill history? Levothyroxine 25 mg last filled on 03/26/2020 for 90 day supply Famotidine 20 mg last filled on 12/19/2019 for 90 day supply Carvedilol 3.125 mg last filled on 03/26/2020 for 90 day supply Amlodipine 5 mg last filled on 03/26/2020 for 90 day supply Allopurinol 100 mg last filled on 03/26/2020 for 90 day supply  [facility] Clinical Pharmacist Assistant [phone]

## 2020-09-18 ENCOUNTER — Ambulatory Visit (INDEPENDENT_AMBULATORY_CARE_PROVIDER_SITE_OTHER): Payer: Medicare Other

## 2020-09-18 ENCOUNTER — Telehealth: Payer: Self-pay

## 2020-09-18 DIAGNOSIS — I152 Hypertension secondary to endocrine disorders: Secondary | ICD-10-CM

## 2020-09-18 DIAGNOSIS — E1159 Type 2 diabetes mellitus with other circulatory complications: Secondary | ICD-10-CM | POA: Diagnosis not present

## 2020-09-18 DIAGNOSIS — N1831 Chronic kidney disease, stage 3a: Secondary | ICD-10-CM | POA: Diagnosis not present

## 2020-09-18 DIAGNOSIS — E1122 Type 2 diabetes mellitus with diabetic chronic kidney disease: Secondary | ICD-10-CM | POA: Diagnosis not present

## 2020-09-18 NOTE — Progress Notes (Signed)
Chronic Care Management Pharmacy Note  09/19/2020 Name:  Drew [name] MRN:  [id number] DOB:  01-27-1941  Summary: Patient presents for CCM follow-up. He is doing well with his blood pressure recently, although his home readings continue to be borderline low.  Recommendations/Changes made from today's visit: Continue current medications  Plan: CPP follow-up in 6 months   Subjective: Drew [name] is an 80 y.o. year old male who is a primary patient of Steele Sizer, MD.  The CCM team was consulted for assistance with disease management and care coordination needs.    Engaged with patient by telephone for follow up visit in response to provider referral for pharmacy case management and/or care coordination services.   Consent to Services:  The patient was given information about Chronic Care Management services, agreed to services, and gave verbal consent prior to initiation of services.  Please see initial visit note for detailed documentation.   Patient Care Team: Steele Sizer, MD as PCP - General (Family Medicine) End, Harrell Gave, MD as PCP - Cardiology (Cardiology) Murlean Iba, MD (Nephrology) Germaine Pomfret, [facility] as Pharmacist (Pharmacist) Flora Lipps, MD as Consulting Physician (Pulmonary Disease)  Recent office visits: 06/16/20: Patient presened to Dr. Ancil Boozer for follow-up. A1c 6.3%.  04/15/20: Patient presented to Clemetine Marker, LPN for AWV.   Recent consult visits: 08/13/20: Patient presented to Dr. Saunders Revel (Cardiology) for follow-up. HCTZ stopped. 08/12/20: Patient presented to Dr. Candiss Norse (Nephrology) for follow-up.  04/16/20: Patient presented to Dr. Mortimer Fries (pulmonology) for OSA follow-up. Compliant with CPAP.   04/07/20: Patient presented to Dr. Candiss Norse (Nephrology) for follow-up. BP 99/66,  Amlodipine stopped  Hospital visits: None in previous 6 months   Objective:  Lab Results  Component Value Date   CREATININE 1.24 (H) 06/16/2020   BUN 14 06/16/2020    GFRNONAA 55 (L) 06/16/2020   GFRAA 63 06/16/2020   NA 143 06/16/2020   K 4.5 06/16/2020   CALCIUM 9.6 06/16/2020   CO2 27 06/16/2020   GLUCOSE 134 (H) 06/16/2020    Lab Results  Component Value Date/Time   HGBA1C 6.3 (A) 06/16/2020 09:11 AM   HGBA1C 6.9 (A) 02/12/2020 08:50 AM   HGBA1C 6.2 (H) 05/04/2019 10:02 AM   HGBA1C 5.9 (H) 08/02/2018 11:14 AM   MICROALBUR 2.2 06/16/2020 10:13 AM   MICROALBUR 2.1 08/02/2018 11:14 AM    Last diabetic Eye exam:  Lab Results  Component Value Date/Time   HMDIABEYEEXA No Retinopathy 06/25/2019 12:00 AM    Last diabetic Foot exam: No results found for: HMDIABFOOTEX   Lab Results  Component Value Date   CHOL 119 05/19/2020   HDL 34 (L) 05/19/2020   LDLCALC 44 05/19/2020   TRIG 205 (H) 05/19/2020   CHOLHDL 3.5 05/19/2020    Hepatic Function Latest Ref Rng & Units 06/16/2020 05/19/2020 02/19/2020  Total Protein 6.1 - 8.1 g/dL 6.5 - -  Albumin 3.6 - 5.1 g/dL - - -  AST 10 - 35 U/L 26 - -  ALT 9 - 46 U/L 33 38 44  Alk Phosphatase 40 - 115 U/L - - -  Total Bilirubin 0.2 - 1.2 mg/dL 1.0 - -    Lab Results  Component Value Date/Time   TSH 2.32 06/16/2020 10:13 AM   TSH 2.57 05/04/2019 10:02 AM    CBC Latest Ref Rng & Units 06/16/2020 05/04/2019 08/30/2017  WBC 3.8 - 10.8 Thousand/uL 5.6 5.6 6.3  Hemoglobin 13.2 - 17.1 g/dL 14.9 15.3 15.4  Hematocrit 38.5 - 50.0 % 43.4  45.5 43.9  Platelets 140 - 400 Thousand/uL 118(L) 137(L) 149    No results found for: VD25OH  Clinical ASCVD: Yes  The ASCVD Risk score Mikey Bussing DC Jr., et al., 2013) failed to calculate for the following reasons:   The 2013 ASCVD risk score is only valid for ages 69 to 54    Depression screen PHQ 2/9 06/16/2020 04/15/2020 02/12/2020  Decreased Interest 0 0 0  Down, Depressed, Hopeless 0 0 0  PHQ - 2 Score 0 0 0  Altered sleeping - - -  Tired, decreased energy - - -  Change in appetite - - -  Feeling bad or failure about yourself  - - -  Trouble concentrating - - -   Moving slowly or fidgety/restless - - -  Suicidal thoughts - - -  PHQ-9 Score - - -  Difficult doing work/chores - - -  Some recent data might be hidden     Social History   Tobacco Use  Smoking Status Former   Years: 1.00   Types: Cigarettes, Cigars   Quit date: 1990   Years since quitting: 32.5  Smokeless Tobacco Never  Tobacco Comments   Smoked afternoon cigar   BP Readings from Last 3 Encounters:  08/13/20 114/72  06/16/20 128/82  04/16/20 134/80   Pulse Readings from Last 3 Encounters:  08/13/20 (!) 57  06/16/20 62  04/16/20 89   Wt Readings from Last 3 Encounters:  08/13/20 232 lb (105.2 kg)  06/16/20 231 lb (104.8 kg)  04/16/20 229 lb 3.2 oz (104 kg)   BMI Readings from Last 3 Encounters:  08/13/20 35.28 kg/m  06/16/20 35.12 kg/m  04/16/20 34.85 kg/m    Assessment/Interventions: Review of patient past medical history, allergies, medications, health status, including review of consultants reports, laboratory and other test data, was performed as part of comprehensive evaluation and provision of chronic care management services.   SDOH:  (Social Determinants of Health) assessments and interventions performed: Yes SDOH Interventions    Flowsheet Row Most Recent Value  SDOH Interventions   Financial Strain Interventions Intervention Not Indicated       SDOH Screenings   Alcohol Screen: Low Risk    Last Alcohol Screening Score (AUDIT): 0  Depression (PHQ2-9): Low Risk    PHQ-2 Score: 0  Financial Resource Strain: Low Risk    Difficulty of Paying Living Expenses: Not hard at all  Food Insecurity: No Food Insecurity   Worried About Charity fundraiser in the Last Year: Never true   Ran Out of Food in the Last Year: Never true  Housing: Low Risk    Last Housing Risk Score: 0  Physical Activity: Inactive   Days of Exercise per Week: 0 days   Minutes of Exercise per Session: 0 min  Social Connections: Moderately Integrated   Frequency of  Communication with Friends and Family: More than three times a week   Frequency of Social Gatherings with Friends and Family: Once a week   Attends Religious Services: More than 4 times per year   Active Member of [employer] or Organizations: No   Attends Archivist Meetings: Never   Marital Status: Married  Stress: No Stress Concern Present   Feeling of Stress : Only a little  Tobacco Use: Medium Risk   Smoking Tobacco Use: Former   Smokeless Tobacco Use: Never  Transportation Needs: No Data processing manager (Medical): No   Lack of Transportation (Non-Medical): No  CCM Care Plan  Allergies  Allergen Reactions   Niacin And Related Itching and Rash    Medications Reviewed Today     Reviewed by Nelva Bush, MD (Physician) on 08/14/20 at 1621  Med List Status: <None>   Medication Order Taking? Sig Documenting Provider Last Dose Status Informant  acetaminophen (TYLENOL) 500 MG tablet [id number] Yes Take 500 mg by mouth every 6 (six) hours as needed. [provider] Taking Active Self  allopurinol (ZYLOPRIM) 100 MG tablet [id number] Yes Take 100 mg by mouth 2 (two) times daily. [provider] Taking Active Self           Med Note ([facility]COM[facility] [place], BRANDY L   Wed Aug 13, 2020 10:19 AM)    amLODipine (NORVASC) 5 MG tablet [id number] Yes TAKE 1 TABLET BY MOUTH  DAILY End, Harrell Gave, MD Taking Active   Apoaequorin (PREVAGEN PO) [id number] Yes Take by mouth. [provider] Taking Active   Ascorbic Acid (VITAMIN C) 1000 MG tablet [id number] Yes Take 1,000 mg by mouth daily. [provider] Taking Active   aspirin EC 81 MG tablet [id number] Yes Take 81 mg by mouth daily. [provider] Taking Active Self  carvedilol (COREG) 3.125 MG tablet [id number] Yes TAKE 1 TABLET BY MOUTH  TWICE DAILY End, Christopher, MD Taking Active   Cholecalciferol (VITAMIN D3) 25 MCG (1000 UT) CAPS [id number] Yes Take 1,000 Units by  mouth daily. [provider] Taking Active Self  Coenzyme Q10 (CO Q 10) 100 MG CAPS [id number] Yes Take 100 mg by mouth daily. [provider] Taking Active Self  famotidine (PEPCID) 20 MG tablet [id number] Yes TAKE 1 TABLET BY MOUTH  DAILY Sowles, Drue Stager, MD Taking Active   levothyroxine (SYNTHROID) 25 MCG tablet [id number] Yes TAKE 1 TABLET BY MOUTH IN  THE Dyanne Iha, Drue Stager, MD Taking Active   liraglutide (VICTOZA) 18 MG/3ML SOPN [id number] Yes Inject 1.2 mg into the skin daily. Steele Sizer, MD Taking Active            Med Note [facility] [place], BRANDY L   Wed Aug 13, 2020 10:20 AM)    losartan (COZAAR) 25 MG tablet [id number] Yes TAKE 1 TABLET BY MOUTH  DAILY End, Christopher, MD Taking Active   Multiple Vitamins-Minerals (CENTRUM SILVER PO) [id number] Yes Take 200 mg by mouth daily. [provider] Taking Active Self  Needle, Disp, 32G X 5/16" [place] [id number] Yes 6 mm by Does not apply route 2 (two) times daily. Steele Sizer, MD Taking Active   Omega-3 Fatty Acids (FISH OIL) 1000 MG CAPS [id number] Yes Take 1,000 mg by mouth daily. [provider] Taking Active Self  rosuvastatin (CRESTOR) 20 MG tablet [id number] Yes Take 1 tablet (20 mg total) by mouth daily. Steele Sizer, MD Taking Active   Turmeric 500 MG CAPS [id number] Yes Take 500 mg by mouth daily. [provider] Taking Active Self            Patient Active Problem List   Diagnosis Date Noted   Coronary artery disease of native artery of native heart with stable angina pectoris ([place]) 08/14/2020   Aortic atherosclerosis ([place]) 08/14/2020   Atrophy of kidney 10/10/2019   Edema of lower extremity 10/10/2019   Proteinuria 10/10/2019   Thrombocytopenia ([place]) 08/13/2019   Diabetes mellitus type 2 in obese ([place]) 08/13/2019   Hyperlipidemia associated with type 2 diabetes mellitus ([place]) 08/13/2019   Neuropathy involving both lower extremities 05/04/2019   Polyneuropathy,  unspecified 05/04/2019   Multiple  renal cysts 04/09/2019   Type II or unspecified type diabetes mellitus with renal manifestations, not stated as uncontrolled 04/09/2019   Stable angina ([place]) 12/29/2018   Chest pain 05/29/2018   Onychomycosis of multiple toenails with type 2 diabetes mellitus ([place]) 11/30/2017   Chronic heart failure with preserved ejection fraction (HFpEF) ([place]) 10/20/2017   Heart failure, unspecified ([place]) 54/65/6812   Diastolic dysfunction 75/17/0017   Obstructive sleep apnea 02/10/2017   Dyspnea on exertion 10/20/2016   Chronic fatigue 10/20/2016   Pain in both lower extremities 10/20/2016   Arm pain, medial, right 08/27/2016   Encounter for cardiac risk counseling 02/10/2016   Light-headedness 11/26/2015   Abnormal ankle brachial index (ABI) 11/26/2015   Stage 3a chronic kidney disease ([place]) 08/15/2015   H/O: upper GI bleed    Medication monitoring encounter 08/07/2015   Morbid obesity ([place]) 08/07/2015   Hypothyroidism due to acquired atrophy of thyroid 03/11/2015   History of prostatitis 11/06/2014   Essential hypertension 11/06/2014   Mixed hyperlipidemia 11/06/2014   Idiopathic chronic gout of right knee without tophus 11/06/2014   Flat feet, bilateral 11/06/2014   History of anemia 11/06/2014   Chronic multiple gastric erosions 11/06/2014   Scrotal swelling 11/06/2014   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 ([place]) 11/06/2014   Chronic gout of ankle 08/16/2014   H/O: GI bleed 12/20/2013   Personal history of other diseases of the digestive system 12/20/2013    Immunization History  Administered Date(s) Administered   Fluad Quad(high Dose 65+) 11/13/2019   Influenza, High Dose Seasonal PF 11/06/2014, 11/24/2015, 11/30/2016, 11/30/2017   Influenza-Unspecified 12/29/2018   PFIZER(Purple Top)SARS-COV-2 Vaccination 04/05/2019, 04/30/2019, 12/24/2019   Pneumococcal Conjugate-13 12/14/2013   Pneumococcal Polysaccharide-23  10/02/2009   Tdap 06/16/2020    Conditions to be addressed/monitored:  Hypertension, Hyperlipidemia, Diabetes, Heart Failure, Coronary Artery Disease, GERD, Chronic Kidney Disease and Gout  Care Plan : General Pharmacy (Adult)  Updates made by Germaine Pomfret, RPH since 09/19/2020 12:00 AM     Problem: Hypertension, Hyperlipidemia, Diabetes, Heart Failure, Coronary Artery Disease, GERD, Chronic Kidney Disease and Gout   Priority: High     Long-Range Goal: Patient-Specific Goal   Start Date: 07/16/2020  Expected End Date: 01/16/2021  This Visit's Progress: On track  Recent Progress: On track  Priority: High  Note:   Current Barriers:  Unable to independently afford treatment regimen  Pharmacist Clinical Goal(s):  Patient will verbalize ability to afford treatment regimen maintain control of diabetes as evidenced by A1c less than 8%  maintain control of heart failure as evidenced by stable weight, lack of hospitalizations through collaboration with PharmD and provider.   Interventions: 1:1 collaboration with Steele Sizer, MD regarding development and update of comprehensive plan of care as evidenced by provider attestation and co-signature Inter-disciplinary care team collaboration (see longitudinal plan of care) Comprehensive medication review performed; medication list updated in electronic medical record  Diabetes (A1c goal <8%) -Controlled -Current medications: Victoza 1.2 mg daily  -Medications previously tried: Metformin XR   -Current home glucose readings fasting glucose: 130-175 typical range, 160  -Denies hypoglycemic/hyperglycemic symptoms -Current meal patterns: does not follow regular dietary regimen -Current exercise: Walking through garden, with dog.  -Educated on Benefits of routine self-monitoring of blood sugar; -Counseled to get yearly eye exams -Recommended to continue current medication  Heart Failure (Goal: manage symptoms and prevent  exacerbations) -Controlled  -Managed by Dr. Saunders Revel -Last ejection fraction: WNL (Date: Jan 2018) -HF type: Diastolic -NYHA Class: I (no  actitivty limitation) -AHA HF Stage: B (Heart disease present - no symptoms present) -Current treatment: Amlodipine 5 mg daily  Carvedilol 3.125 mg twice daily  Losartan 25 mg daily  -Medications previously tried: NA  -Current home BP/HR readings:  101/95 Pulse 96 131/73 Pulse 66  108/70 Pulse 69  111/80 Pulse 82 106/61 Pulse 77  104/62 Pulse 61  93/59 Pulse 67 (tired)  113/69 Pulse 61   -Able to walk from house to road (3000 ft) before significant dyspnea  -Educated on Importance of weighing daily; if you gain more than 3 pounds in one day or 5 pounds in one week, contact cardiology office -Recommended to continue current medication. Could consider decreasing amlodipine given borderline blood pressure readings.   Hyperlipidemia: (LDL goal < 70) -Controlled -Current treatment: Rosuvastatin 20 mg daily  -Current antiplatelet treatment: Aspirin 81 mg daily  -Medications previously tried: NA -Educated on Importance of limiting foods high in cholesterol; -Recommended to continue current medication  Hypothyroidism (Goal: Maintain stable thyroid function) -Controlled -Current treatment  Levothyroxine 25 mcg daily  -Medications previously tried: NA  -Recommended to continue current medication  Gout (Goal: Prevent gout flares) -Controlled  -Managed by Dr. Murlean Iba -Current treatment  Allopurinol 100 mg twice daily  -Medications previously tried: NA  -Counseled patient on low purine diet plan. Counseled patient to reduce consumption of high-fructose corn syrup, sweetened soft drinks, fruit juices, meat, and seafood. -Recommended to continue current medication  GERD (Goal: Prevent heartburn/reflux) -Controlled -Current treatment  Famotidine 20 mg daily  -Medications previously tried: NA  -Recommended to continue current  medication  Patient Goals/Self-Care Activities Patient will:  - check glucose daily, document, and provide at future appointments check blood pressure twice weekly, document, and provide at future appointments weigh daily, and contact provider if weight gain of greater than 2 pounds  Follow Up Plan: Telephone follow up appointment with care management team member scheduled for:  03/25/2021 at 2:00 PM       Medication Assistance:  Victoza obtained through [name] medication assistance program.  Enrollment ends Dec 2022  Compliance/Adherence/Medication fill history: Care Gaps: Ophthalmology Exam  Star-Rating Drugs: No fill history available  Patient's preferred pharmacy is:  [facility]  ([place]) - [place], [address] [place] [address] Phone: [phone] Fax: [place] [address] [place] [address] Phone: [phone] Fax: [phone]  Uses pill box? Yes Pt endorses 100% compliance  We discussed: Current pharmacy is preferred with insurance plan and patient is satisfied with pharmacy services Patient decided to: Continue current medication management strategy  Care Plan and Follow Up Patient Decision:  Patient agrees to Care Plan and Follow-up.  Plan: Telephone follow up appointment with care management team member scheduled for:  03/25/2021 at 2:00 PM  Doristine Section, [facility] [phone]

## 2020-09-19 NOTE — Patient Instructions (Signed)
Visit Information It was great speaking with you today!  Please let me know if you have any questions about our visit.   Goals Addressed             This Visit's Progress    Monitor and Manage My Blood Sugar-Diabetes Type 2       Timeframe:  Long-Range Goal Priority:  High Start Date: 07/16/2020                             Expected End Date:  01/16/2022                      Follow Up Date 11/21/2020   -check blood sugar daily - check blood sugar if I feel it is too high or too low - enter blood sugar readings and medication or insulin into daily log - take the blood sugar log to all doctor visits    Why is this important?   Checking your blood sugar at home helps to keep it from getting very high or very low.  Writing the results in a diary or log helps the doctor know how to care for you.  Your blood sugar log should have the time, date and the results.  Also, write down the amount of insulin or other medicine that you take.  Other information, like what you ate, exercise done and how you were feeling, will also be helpful.     Notes:      Track and Manage Fluids and Swelling-Heart Failure       Timeframe:  Long-Range Goal Priority:  High Start Date:  07/16/2020                           Expected End Date: 01/16/2022                      Follow Up Date 12/23/2020   - call office if I gain more than 2 pounds in one day or 5 pounds in one week - track weight in diary - use salt in moderation - watch for swelling in feet, ankles and legs every day - weigh myself daily    Why is this important?   It is important to check your weight daily and watch how much salt and liquids you have.  It will help you to manage your heart failure.    Notes:         Patient Care Plan: General Pharmacy (Adult)     Problem Identified: Hypertension, Hyperlipidemia, Diabetes, Heart Failure, Coronary Artery Disease, GERD, Chronic Kidney Disease and Gout   Priority: High      Long-Range Goal: Patient-Specific Goal   Start Date: 07/16/2020  Expected End Date: 01/16/2021  This Visit's Progress: On track  Recent Progress: On track  Priority: High  Note:   Current Barriers:  Unable to independently afford treatment regimen  Pharmacist Clinical Goal(s):  Patient will verbalize ability to afford treatment regimen maintain control of diabetes as evidenced by A1c less than 8%  maintain control of heart failure as evidenced by stable weight, lack of hospitalizations through collaboration with PharmD and provider.   Interventions: 1:1 collaboration with Alba Cory, MD regarding development and update of comprehensive plan of care as evidenced by provider attestation and co-signature Inter-disciplinary care team collaboration (see longitudinal plan of care) Comprehensive medication review performed; medication list  updated in electronic medical record  Diabetes (A1c goal <8%) -Controlled -Current medications: Victoza 1.2 mg daily  -Medications previously tried: Metformin XR   -Current home glucose readings fasting glucose: 130-175 typical range, 160  -Denies hypoglycemic/hyperglycemic symptoms -Current meal patterns: does not follow regular dietary regimen -Current exercise: Walking through garden, with dog.  -Educated on Benefits of routine self-monitoring of blood sugar; -Counseled to get yearly eye exams -Recommended to continue current medication  Heart Failure (Goal: manage symptoms and prevent exacerbations) -Controlled  -Managed by Dr. Okey Dupre -Last ejection fraction: WNL (Date: Jan 2018) -HF type: Diastolic -NYHA Class: I (no actitivty limitation) -AHA HF Stage: B (Heart disease present - no symptoms present) -Current treatment: Amlodipine 5 mg daily  Carvedilol 3.125 mg twice daily  Losartan 25 mg daily  -Medications previously tried: NA  -Current home BP/HR readings:  101/95 Pulse 96 131/73 Pulse 66  108/70 Pulse 69  111/80 Pulse  82 106/61 Pulse 77  104/62 Pulse 61  93/59 Pulse 67 (tired)  113/69 Pulse 61   -Able to walk from house to road (3000 ft) before significant dyspnea  -Educated on Importance of weighing daily; if you gain more than 3 pounds in one day or 5 pounds in one week, contact cardiology office -Recommended to continue current medication. Could consider decreasing amlodipine given borderline blood pressure readings.   Hyperlipidemia: (LDL goal < 70) -Controlled -Current treatment: Rosuvastatin 20 mg daily  -Current antiplatelet treatment: Aspirin 81 mg daily  -Medications previously tried: NA -Educated on Importance of limiting foods high in cholesterol; -Recommended to continue current medication  Hypothyroidism (Goal: Maintain stable thyroid function) -Controlled -Current treatment  Levothyroxine 25 mcg daily  -Medications previously tried: NA  -Recommended to continue current medication  Gout (Goal: Prevent gout flares) -Controlled  -Managed by Dr. Mosetta Pigeon -Current treatment  Allopurinol 100 mg twice daily  -Medications previously tried: NA  -Counseled patient on low purine diet plan. Counseled patient to reduce consumption of high-fructose corn syrup, sweetened soft drinks, fruit juices, meat, and seafood. -Recommended to continue current medication  GERD (Goal: Prevent heartburn/reflux) -Controlled -Current treatment  Famotidine 20 mg daily  -Medications previously tried: NA  -Recommended to continue current medication  Patient Goals/Self-Care Activities Patient will:  - check glucose daily, document, and provide at future appointments check blood pressure twice weekly, document, and provide at future appointments weigh daily, and contact provider if weight gain of greater than 2 pounds  Follow Up Plan: Telephone follow up appointment with care management team member scheduled for:  03/25/2021 at 2:00 PM    Patient agreed to services and verbal consent obtained.    The patient verbalized understanding of instructions, educational materials, and care plan provided today and declined offer to receive copy of patient instructions, educational materials, and care plan.   Cheyenne Adas, CPP Clinical Pharmacist [facility] [phone]

## 2020-10-07 ENCOUNTER — Other Ambulatory Visit: Payer: Self-pay | Admitting: Internal Medicine

## 2020-10-10 DIAGNOSIS — I10 Essential (primary) hypertension: Secondary | ICD-10-CM | POA: Diagnosis not present

## 2020-10-10 DIAGNOSIS — G4733 Obstructive sleep apnea (adult) (pediatric): Secondary | ICD-10-CM | POA: Diagnosis not present

## 2020-10-15 ENCOUNTER — Encounter: Payer: Self-pay | Admitting: Family Medicine

## 2020-10-15 ENCOUNTER — Telehealth: Payer: Self-pay

## 2020-10-15 ENCOUNTER — Other Ambulatory Visit: Payer: Self-pay

## 2020-10-15 ENCOUNTER — Ambulatory Visit (INDEPENDENT_AMBULATORY_CARE_PROVIDER_SITE_OTHER): Payer: Medicare Other | Admitting: Family Medicine

## 2020-10-15 VITALS — BP 122/84 | HR 86 | Temp 98.1°F | Resp 16 | Ht 68.0 in | Wt 225.0 lb

## 2020-10-15 DIAGNOSIS — G4733 Obstructive sleep apnea (adult) (pediatric): Secondary | ICD-10-CM | POA: Diagnosis not present

## 2020-10-15 DIAGNOSIS — I700 Atherosclerosis of aorta: Secondary | ICD-10-CM

## 2020-10-15 DIAGNOSIS — E1122 Type 2 diabetes mellitus with diabetic chronic kidney disease: Secondary | ICD-10-CM

## 2020-10-15 DIAGNOSIS — I5032 Chronic diastolic (congestive) heart failure: Secondary | ICD-10-CM

## 2020-10-15 DIAGNOSIS — E785 Hyperlipidemia, unspecified: Secondary | ICD-10-CM

## 2020-10-15 DIAGNOSIS — D696 Thrombocytopenia, unspecified: Secondary | ICD-10-CM | POA: Diagnosis not present

## 2020-10-15 DIAGNOSIS — E1159 Type 2 diabetes mellitus with other circulatory complications: Secondary | ICD-10-CM

## 2020-10-15 DIAGNOSIS — I208 Other forms of angina pectoris: Secondary | ICD-10-CM

## 2020-10-15 DIAGNOSIS — Z23 Encounter for immunization: Secondary | ICD-10-CM

## 2020-10-15 DIAGNOSIS — N1831 Chronic kidney disease, stage 3a: Secondary | ICD-10-CM | POA: Diagnosis not present

## 2020-10-15 DIAGNOSIS — E034 Atrophy of thyroid (acquired): Secondary | ICD-10-CM | POA: Diagnosis not present

## 2020-10-15 DIAGNOSIS — E1169 Type 2 diabetes mellitus with other specified complication: Secondary | ICD-10-CM | POA: Diagnosis not present

## 2020-10-15 DIAGNOSIS — M109 Gout, unspecified: Secondary | ICD-10-CM

## 2020-10-15 DIAGNOSIS — Z9989 Dependence on other enabling machines and devices: Secondary | ICD-10-CM

## 2020-10-15 DIAGNOSIS — I152 Hypertension secondary to endocrine disorders: Secondary | ICD-10-CM

## 2020-10-15 LAB — POCT GLYCOSYLATED HEMOGLOBIN (HGB A1C): Hemoglobin A1C: 6.4 % — AB (ref 4.0–5.6)

## 2020-10-15 MED ORDER — SHINGRIX 50 MCG/0.5ML IM SUSR: 0.5000 mL | Freq: Once | INTRAMUSCULAR | 1 refills | Status: AC

## 2020-10-15 NOTE — Progress Notes (Signed)
Name: Drew [name]   MRN: [id number]    DOB: 06-Jan-1941   Date:10/15/2020       Progress Note  Subjective  Chief Complaint  Follow Up  HPI  DMII: he has obesity, dyslipidemia, HTN and CKI. His weight is going down, but BMI is now below 35, A1C had  gone from 6.2 % to 6.9 %, 6.3% and today is 6.4%  He is still taking Victoza daily and denies hypoglycemic episodes glucose usually in the 130-140's fasting, glucose at home has been within normal range . Denies polyphagia, polyuria or polydipsia.  He is on ARB and statin therapy.    Thrombocytopenia: last level was 118, denies easy bleeding.   OSA: he is complaint with CPAP He sleeps well through the night . Denies snoring    HTN: bp is at goal, he has intermittent chest pain, on ARB  and beta blocker and norvasc. He denies dizziness, no orthopnea, but has intermittent chest pain and also sob with moderate activity. BP is at goal today   Dyslipidemia: discussed adding fish to diet, his last LDL was not at goal, he was seen by Dr. Okey Dupre recently and cardiac angiogram was abnormal, Dr. Okey Dupre adjusted dose of Crestor to 10 mg and last LDL was at goal at 44, we will rechck yearly   CHF :under the care of Dr. Okey Dupre, no orthopnea, wheezing he has sob with moderate activity . He has intermittent lower extremity edema but not today, denies orthopnea .  He is on beta-blocker and ARB, takes aspirin daily .  Stable Angina/Atherosclerosis aorta : he has intermittent chest pain but intermittent only,  under the care of cardiologist, he does not have NTG at home. He states he has been doing well, taking higher dose of Crestor and denies side effects.   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 Thedore Mins, no pruritus, good urine output, last GFR stable  BP is at goal . BP is at goal   Hypothyroidism: on  the same dose of levothyroxine , denies dysphagia, dry skin or change in bowel movements  TSH has been stable.   History of gastric ulcer: denies blood in stools or melena, normal appetite, no epigastric pain .   Patient Active Problem List   Diagnosis Date Noted   Coronary artery disease of native artery of native heart with stable angina pectoris (HCC) 08/14/2020   Aortic atherosclerosis (HCC) 08/14/2020   Atrophy of kidney 10/10/2019   Edema of lower extremity 10/10/2019   Thrombocytopenia (HCC) 08/13/2019   Diabetes mellitus type 2 in obese (HCC) 08/13/2019   Hyperlipidemia associated with type 2 diabetes mellitus (HCC) 08/13/2019   Neuropathy involving both lower extremities 05/04/2019   Polyneuropathy, unspecified 05/04/2019   Multiple renal cysts 04/09/2019   Stable angina (HCC) 12/29/2018   Onychomycosis of multiple toenails with type 2 diabetes mellitus (HCC) 11/30/2017   Chronic heart failure with preserved ejection fraction (HFpEF) (HCC) 10/20/2017   Obstructive sleep apnea 02/10/2017   Abnormal ankle brachial index (ABI) 11/26/2015   Stage 3a chronic kidney disease (HCC) 08/15/2015   H/O: upper GI bleed    Hypothyroidism due to acquired atrophy of thyroid 03/11/2015   History of prostatitis 11/06/2014   Essential hypertension 11/06/2014   Mixed hyperlipidemia 11/06/2014   Flat feet, bilateral  11/06/2014   Chronic multiple gastric erosions 11/06/2014   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) 11/06/2014    Past Surgical History:  Procedure Laterality Date   CATARACT EXTRACTION, BILATERAL Bilateral 11/18/2017   COLONOSCOPY N/A 12/27/2013   Procedure: COLONOSCOPY;  Surgeon: Barrie Folk, MD;  Location: [facility] ENDOSCOPY;  Service: Endoscopy;  Laterality: N/A;   ENTEROSCOPY N/A 12/21/2013   Procedure: ENTEROSCOPY;  Surgeon: Vertell Novak., MD;  Location: [facility] ENDOSCOPY;  Service: Endoscopy;  Laterality: N/A;   ENTEROSCOPY N/A 12/27/2013    Procedure: ENTEROSCOPY;  Surgeon: Barrie Folk, MD;  Location: [facility] ENDOSCOPY;  Service: Endoscopy;  Laterality: N/A;    Family History  Problem Relation Age of Onset   Hyperlipidemia Father    Kidney disease Father    Heart disease Father    Congestive Heart Failure Mother    Heart disease Mother    Hyperlipidemia Daughter    Hyperlipidemia Son    Diabetes Son    Anxiety disorder Daughter    Depression Daughter    Arthritis Daughter     Social History   Tobacco Use   Smoking status: Former    Years: 1.00    Types: Cigarettes, Cigars    Quit date: 1990    Years since quitting: 32.6   Smokeless tobacco: Never   Tobacco comments:    Smoked afternoon cigar  Substance Use Topics   Alcohol use: No     Current Outpatient Medications:    acetaminophen (TYLENOL) 500 MG tablet, Take 500 mg by mouth every 6 (six) hours as needed., Disp: , Rfl:    allopurinol (ZYLOPRIM) 100 MG tablet, Take 100 mg by mouth 2 (two) times daily., Disp: , Rfl:    amLODipine (NORVASC) 5 MG tablet, TAKE 1 TABLET BY MOUTH  DAILY, Disp: 90 tablet, Rfl: 3   Apoaequorin (PREVAGEN PO), Take by mouth., Disp: , Rfl:    Ascorbic Acid (VITAMIN C) 1000 MG tablet, Take 1,000 mg by mouth daily., Disp: , Rfl:    aspirin EC 81 MG tablet, Take 81 mg by mouth daily., Disp: , Rfl:    carvedilol (COREG) 3.125 MG tablet, TAKE 1 TABLET BY MOUTH  TWICE DAILY, Disp: 180 tablet, Rfl: 3   Cholecalciferol (VITAMIN D3) 25 MCG (1000 UT) CAPS, Take 1,000 Units by mouth daily., Disp: , Rfl:    Coenzyme Q10 (CO Q 10) 100 MG CAPS, Take 100 mg by mouth daily., Disp: , Rfl:    famotidine (PEPCID) 20 MG tablet, TAKE 1 TABLET BY MOUTH  DAILY, Disp: 90 tablet, Rfl: 3   levothyroxine (SYNTHROID) 25 MCG tablet, TAKE 1 TABLET BY MOUTH IN  THE MORNING, Disp: 90 tablet, Rfl: 2   liraglutide (VICTOZA) 18 MG/3ML SOPN, Inject 1.2 mg into the skin daily., Disp: 27 mL, Rfl: 1   losartan (COZAAR) 25 MG tablet, TAKE 1 TABLET BY MOUTH  DAILY, Disp: 90  tablet, Rfl: 3   Multiple Vitamins-Minerals (CENTRUM SILVER PO), Take 200 mg by mouth daily., Disp: , Rfl:    Needle, Disp, 32G X 5/16" MISC, 6 mm by Does not apply route 2 (two) times daily., Disp: 100 each, Rfl: 3   Omega-3 Fatty Acids (FISH OIL) 1000 MG CAPS, Take 1,000 mg by mouth daily., Disp: , Rfl:    rosuvastatin (CRESTOR) 20 MG tablet, TAKE 1 TABLET BY MOUTH  DAILY, Disp: 90 tablet, Rfl: 2   Turmeric 500 MG CAPS, Take 500 mg by mouth daily., Disp: , Rfl:  Zoster Vaccine Adjuvanted [facility]) injection, Inject 0.5 mLs into the muscle once for 1 dose., Disp: 0.5 mL, Rfl: 1  Allergies  Allergen Reactions   Niacin And Related Itching and Rash    I personally reviewed active problem list, medication list, allergies, family history, social history, health maintenance with the patient/caregiver today.   ROS  Constitutional: Negative for fever or weight change.  Respiratory: Negative for cough and shortness of breath.   Cardiovascular: positive  for chest pain but no  palpitations.  Gastrointestinal: Negative for abdominal pain, no bowel changes.  Musculoskeletal: Negative for gait problem or joint swelling.  Skin: Negative for rash.  Neurological: Negative for dizziness or headache.  No other specific complaints in a complete review of systems (except as listed in HPI above).   Objective  Vitals:   10/15/20 1016  BP: 122/84  Pulse: 86  Resp: 16  Temp: 98.1 F (36.7 C)  SpO2: 96%  Weight: 225 lb (102.1 kg)  Height: 5\' 8"  (1.727 m)    Body mass index is 34.21 kg/m.  Physical Exam  Constitutional: Patient appears well-developed and well-nourished. Obese  No distress.  HEENT: head atraumatic, normocephalic, pupils equal and reactive to light, neck supple Cardiovascular: Normal rate, regular rhythm and normal heart sounds.  No murmur heard. Trace  BLE edema. Pulmonary/Chest: Effort normal and breath sounds normal. No respiratory distress. Abdominal: Soft.  There is no  tenderness. Psychiatric: Patient has a normal mood and affect. behavior is normal. Judgment and thought content normal.    PHQ2/9: Depression screen [facility] 2/9 10/15/2020 06/16/2020 04/15/2020 02/12/2020 11/13/2019  Decreased Interest 0 0 0 0 0  Down, Depressed, Hopeless 0 0 0 0 0  PHQ - 2 Score 0 0 0 0 0  Altered sleeping - - - - -  Tired, decreased energy - - - - -  Change in appetite - - - - -  Feeling bad or failure about yourself  - - - - -  Trouble concentrating - - - - -  Moving slowly or fidgety/restless - - - - -  Suicidal thoughts - - - - -  PHQ-9 Score - - - - -  Difficult doing work/chores - - - - -  Some recent data might be hidden    phq 9 is negative   Fall Risk: Fall Risk  10/15/2020 06/16/2020 04/15/2020 02/12/2020 11/13/2019  Falls in the past year? 1 1 0 1 1  Number falls in past yr: 1 1 1 1  0  Comment - - - - -  Injury with Fall? 0 0 0 0 1  Comment - - - - -  Risk for fall due to : - - History of fall(s) - History of fall(s)  Follow up Falls prevention discussed - Falls prevention discussed - -     Functional Status Survey: Is the patient deaf or have difficulty hearing?: No Does the patient have difficulty seeing, even when wearing glasses/contacts?: No Does the patient have difficulty concentrating, remembering, or making decisions?: No Does the patient have difficulty walking or climbing stairs?: No Does the patient have difficulty dressing or bathing?: No Does the patient have difficulty doing errands alone such as visiting a doctor's office or shopping?: No    Assessment & Plan  1. Type 2 diabetes mellitus with stage 3a chronic kidney disease, without long-term current use of insulin (HCC)  - POCT HgB A1C  2. Stable angina (HCC)  Stable, under the care of nephrologist   3.  Chronic heart failure with preserved ejection fraction (HCC)   4. Hypertension associated with type 2 diabetes mellitus (HCC)  Bp is at goal   5. Hyperlipidemia associated  with type 2 diabetes mellitus (HCC)  Last LDL at goal  6. Controlled gout  Last uric acid was at goal, no recent episodes   7. Stage 3a chronic kidney disease (HCC)  Advised to avoid nsaid;s , stable   8. OSA on CPAP  Continue CPAP use   9. Thrombocytopenia (HCC)  Denies bleeding   10. Hypothyroidism due to acquired atrophy of thyroid   11. Aortic atherosclerosis (HCC)  On statin and aspirin

## 2020-10-15 NOTE — Progress Notes (Signed)
Chronic Care Management Pharmacy Assistant   Name: Drew [name]  MRN: [id number] DOB: 04-16-1940  Reason for Encounter:Diabetes Disease State Call.  Recent office visits:  10/15/2020 Dr.Sowles MD No medication Changes Noted, A1C on 10/15/2020 was 6.4  Recent consult visits:  No recent Consult Visit  Hospital visits:  None in previous 6 months  Medications: Outpatient Encounter Medications as of 10/15/2020  Medication Sig   acetaminophen (TYLENOL) 500 MG tablet Take 500 mg by mouth every 6 (six) hours as needed.   allopurinol (ZYLOPRIM) 100 MG tablet Take 100 mg by mouth 2 (two) times daily.   amLODipine (NORVASC) 5 MG tablet TAKE 1 TABLET BY MOUTH  DAILY   Apoaequorin (PREVAGEN PO) Take by mouth.   Ascorbic Acid (VITAMIN C) 1000 MG tablet Take 1,000 mg by mouth daily.   aspirin EC 81 MG tablet Take 81 mg by mouth daily.   carvedilol (COREG) 3.125 MG tablet TAKE 1 TABLET BY MOUTH  TWICE DAILY   Cholecalciferol (VITAMIN D3) 25 MCG (1000 UT) CAPS Take 1,000 Units by mouth daily.   Coenzyme Q10 (CO Q 10) 100 MG CAPS Take 100 mg by mouth daily.   famotidine (PEPCID) 20 MG tablet TAKE 1 TABLET BY MOUTH  DAILY   levothyroxine (SYNTHROID) 25 MCG tablet TAKE 1 TABLET BY MOUTH IN  THE MORNING   liraglutide (VICTOZA) 18 MG/3ML SOPN Inject 1.2 mg into the skin daily.   losartan (COZAAR) 25 MG tablet TAKE 1 TABLET BY MOUTH  DAILY   Multiple Vitamins-Minerals (CENTRUM SILVER PO) Take 200 mg by mouth daily.   Needle, Disp, 32G X 5/16" MISC 6 mm by Does not apply route 2 (two) times daily.   Omega-3 Fatty Acids (FISH OIL) 1000 MG CAPS Take 1,000 mg by mouth daily.   rosuvastatin (CRESTOR) 20 MG tablet TAKE 1 TABLET BY MOUTH  DAILY   Turmeric 500 MG CAPS Take 500 mg by mouth daily.   No facility-administered encounter medications on file as of 10/15/2020.    Care Gaps: Shingrix Vaccine COVID-19 Vaccnie Ophthalmology Exam Influenza Vaccine Star Rating Drugs: Rosuvastatin 20 mg  last  filled 04/23/2020 for 90 day supply (No pharmacy noted) Losartan 25 mg last filled 06/11/2020 for 90 day supply (No pharmacy noted) Victoza 18 MG  last filled 03/26/2020 for 90 day supply (No pharmacy noted) Medication Fill Gaps: Amlodipine 5 mg last filled 06/11/2020 for 90 day supply Allopurinol 100 mg last filled 06/11/2020 for 90 day supply Carvedilol 3.125 mg last filled 06/11/2020 for 90 day supply Famotidine 20 mg last filled 06/11/2020 for 90 day supply Levothyroxine 25 mcg last filled 06/11/2020 for 90 day supply  Recent Relevant Labs: Lab Results  Component Value Date/Time   HGBA1C 6.3 (A) 06/16/2020 09:11 AM   HGBA1C 6.9 (A) 02/12/2020 08:50 AM   HGBA1C 6.2 (H) 05/04/2019 10:02 AM   HGBA1C 5.9 (H) 08/02/2018 11:14 AM   MICROALBUR 2.2 06/16/2020 10:13 AM   MICROALBUR 2.1 08/02/2018 11:14 AM    Kidney Function Lab Results  Component Value Date/Time   CREATININE 1.24 (H) 06/16/2020 10:13 AM   CREATININE 1.32 (H) 01/08/2020 01:56 PM   CREATININE 1.22 (H) 05/04/2019 10:02 AM   GFRNONAA 55 (L) 06/16/2020 10:13 AM   GFRAA 63 06/16/2020 10:13 AM    Current antihyperglycemic regimen:  Victoza 1.2 mg daily  What recent interventions/DTPs have been made to improve glycemic control:  None ID Have there been any recent hospitalizations or ED visits since last visit with  CPP? No Patient denies hypoglycemic symptoms, including Pale, Sweaty, Shaky, Hungry, Nervous/irritable, and Vision changes Patient denies hyperglycemic symptoms, including blurry vision, excessive thirst, fatigue, polyuria, and weakness How often are you checking your blood sugar? once daily What are your blood sugars ranging?  Fasting:  Patient states his blood sugar has been ranging around 130-140's. Before meals: N/A After meals: N/A Bedtime: N.A During the week, how often does your blood glucose drop below 70? Never Are you checking your feet daily/regularly?  Patient denies pain,numbness, or tingling  sensation in his feet.  Patient states he has times where he feels "not right/not his self", but then the next day he feels good.Patient reports yesterday on 10/16/2020 he didn't feel good, and all he wanted to do was sleep.Patient is unsure if that is related to receiving his shingrix vaccine on 10/15/2020 because he has been having these "episodes here and there not just one time".I ask patient if he would like me to schedule a appointment with the clinical pharmacist so he can discuss this further, but patient denies scheduling appointment  at this time.Patient states if he has this "episode" again he will return my call and schedule the appointment.Inform patient I will reach out to him again in September to see how he if feeling and doing.Patient Verbalized understanding and appreciation.    PHQ-9 Question: Not at all(0) Several days (1)More than half the days (2)Nearly every day(3) 1. Little interest or pleasure in doing things- 1 2. Feeling down, depressed, or hopeless - 2 3. Trouble falling or staying asleep, or sleeping too much - 3 (patient states there are nights where he will sleep good, and other nights where he wakes up every 2 hours and takes forever to fall back to sleep) 4. Feeling tired or having little energy - 1 5. Poor appetite or overeating -0 6. Feeling bad about yourself or that you are a failure or  have let yourself or your family down -0 7. Trouble concentrating on things, such as reading the  newspaper or watching television - 0 8. Moving or speaking so slowly that other people could  have noticed. Or the opposite being so fidgety or  restless that you have been moving around a lot more  than usual -2  9. Thoughts that you would be better off dead, or of  hurting yourself -0  Total = 9  10. If you checked off any problems, how difficult have these problems made it for you to do  your work, take care of things at home, or get  along with other people?      Somewhat difficult   Adherence Review: Is the patient currently on a STATIN medication? Yes Is the patient currently on ACE/ARB medication? Yes Does the patient have >5 day gap between last estimated fill dates? Yes

## 2020-11-18 ENCOUNTER — Telehealth: Payer: Self-pay | Admitting: Internal Medicine

## 2020-11-18 NOTE — Telephone Encounter (Signed)
Was able to reach out to Drew [name] regarding his concerns. He reports over the past 3 weeks has been fatigue, having low energy, and SOB at times. Reports having to stop and rest at times as he feels "wore out". Pt states usually has no problem with walking out to his garden and fiddling around, but lately it has been wearing him out, having to take frequent rest spells.  Pt reports no CP, no cold/flu like symptoms. Did have one dizzy spell a few weeks ago while checking his tomatoes.   Noted pt has not had any recent cardiac testing. CCTA 12/2019 "shows mild to moderate narrowing of his heart arteries but no critical blockage to explain his symptoms"  Last ECHO 02/2016 "The estimated ejection fraction was in the range of 55% to 60%"  Pt wondering if repeat ECHO needed d/t his current symptoms of low energy, SOB, and having to stop and rest with walking and with activities.   Pt schedule for 9/28 with Dr. Okey Dupre at 10:40 am  Drew [name] thankful for the return call and able to get him an appt, will see pt tomorrow in office.

## 2020-11-18 NOTE — Telephone Encounter (Signed)
Patient called, states he doesn't feel like he has any energy. States he has to rest for about 15-30 minutes before he feels better. States he "doesn't feel up to par". Denies any chest pain,  states he feels like someone has their hand over his mouth and nose. He states this comes and goes. He states this has been going on off and on for about 3 weeks.  Please call to discuss.

## 2020-11-18 NOTE — Progress Notes (Signed)
Cardiology Office Note:    Date:  11/20/2020   ID:  Drew [name], DOB January 26, 1941, MRN [id number]  PCP:  Alba Cory, MD  [facility] Cardiologist:  None  [facility] Electrophysiologist:  None   Referring MD: Alba Cory, MD   Chief Complaint: SOB, hospital follow-up  History of Present Illness:    Drew [name] is a 80 y.o. male with a hx of HFpEF, HTN, HLD, CKD stage 3, GERD, obesity for hospital follow-up.   He had prior coronary CTA 2021 showing extensive coronary artery calcification with moderate multivessel disease that was not significant by FFR.   Last seen 08/13/20 and reported persistent exertional dyspnea. Since symptoms were stable no further testing was ordered.   Today, the patient reports shortness of breath and chest pain. Reports it has been getting worse. Worse when he exerts himself. Not able to walk as far. No orthopnea, pnd. Reports LLE, worse at the end of the day. He had some exertional chest pain yesterday. It was a dull pain. First time he felt this pain. Only lasted a couple seconds. No fever, chills, nausea, vomiting. Eating and drinking ok. Eats a lot of salt.No smoking history. No alcohol or drug history. Eats a lot of fried food. HE has OSA and reports compliance with CPAP.  Past Medical History:  Diagnosis Date   (HFpEF) heart failure with preserved ejection fraction (HCC)    a. 02/2016 Echo: EF nl. Gr1 DD. Mild AI. Mod thickened AoV w/o stenosis. Nl RV size and fxn.   Aortic atherosclerosis (HCC)    CAD (coronary artery disease)    a. 12/2019 Cor CTA: LM nl, LAD 25-49p/m, LCX small, nondom, nl, RCA large, dom, 50-69p (nl FFR), 25-31m, Ca2+ = 1602 (79th%'ile)-->Med rx.   Chronic kidney disease, stage III (moderate) (HCC) 08/15/2015   Diabetes mellitus with complication (HCC)    GERD (gastroesophageal reflux disease)    H/O: upper GI bleed 2015   Hiatal hernia    a. 12/2019 Moderate-sized HH incidentally noted on Cor CTA.   History of  stress test    a. 05/2016 Lexiscan MV: EF 72%, No ischemia-->Low risk.   Hypercholesteremia    Hypertension    Obesity (BMI 30-39.9) 08/07/2015   Peripheral vascular disease (HCC) 12/26/2015    Past Surgical History:  Procedure Laterality Date   CATARACT EXTRACTION, BILATERAL Bilateral 11/18/2017   COLONOSCOPY N/A 12/27/2013   Procedure: COLONOSCOPY;  Surgeon: Barrie Folk, MD;  Location: [facility] ENDOSCOPY;  Service: Endoscopy;  Laterality: N/A;   ENTEROSCOPY N/A 12/21/2013   Procedure: ENTEROSCOPY;  Surgeon: Vertell Novak., MD;  Location: [facility] ENDOSCOPY;  Service: Endoscopy;  Laterality: N/A;   ENTEROSCOPY N/A 12/27/2013   Procedure: ENTEROSCOPY;  Surgeon: Barrie Folk, MD;  Location: [facility] ENDOSCOPY;  Service: Endoscopy;  Laterality: N/A;    Current Medications: Current Meds  Medication Sig   acetaminophen (TYLENOL) 500 MG tablet Take 500 mg by mouth every 6 (six) hours as needed.   allopurinol (ZYLOPRIM) 100 MG tablet Take 100 mg by mouth 2 (two) times daily.   amLODipine (NORVASC) 5 MG tablet TAKE 1 TABLET BY MOUTH  DAILY   Apoaequorin (PREVAGEN PO) Take by mouth.   Ascorbic Acid (VITAMIN C) 1000 MG tablet Take 1,000 mg by mouth daily.   aspirin EC 81 MG tablet Take 81 mg by mouth daily.   carvedilol (COREG) 3.125 MG tablet TAKE 1 TABLET BY MOUTH  TWICE DAILY   Cholecalciferol (VITAMIN D3) 25 MCG (1000  UT) CAPS Take 1,000 Units by mouth daily.   Coenzyme Q10 (CO Q 10) 100 MG CAPS Take 100 mg by mouth daily.   famotidine (PEPCID) 20 MG tablet TAKE 1 TABLET BY MOUTH  DAILY   levothyroxine (SYNTHROID) 25 MCG tablet TAKE 1 TABLET BY MOUTH IN  THE MORNING   liraglutide (VICTOZA) 18 MG/3ML SOPN Inject 1.2 mg into the skin daily.   losartan (COZAAR) 25 MG tablet TAKE 1 TABLET BY MOUTH  DAILY   Multiple Vitamins-Minerals (CENTRUM SILVER PO) Take 200 mg by mouth daily.   Needle, Disp, 32G X 5/16" MISC 6 mm by Does not apply route 2 (two) times daily.   Omega-3 Fatty Acids (FISH OIL) 1000 MG  CAPS Take 1,000 mg by mouth daily.   rosuvastatin (CRESTOR) 20 MG tablet TAKE 1 TABLET BY MOUTH  DAILY   Turmeric 500 MG CAPS Take 500 mg by mouth daily.     Allergies:   Niacin and related   Social History   Socioeconomic History   Marital status: Married    Spouse name: Not on file   Number of children: 4   Years of education: Not on file   Highest education level: High school graduate  Occupational History   Occupation: retired  Tobacco Use   Smoking status: Former    Years: 1.00    Types: Cigarettes, Cigars    Quit date: 1990    Years since quitting: 32.7   Smokeless tobacco: Never   Tobacco comments:    Smoked afternoon cigar  Vaping Use   Vaping Use: Never used  Substance and Sexual Activity   Alcohol use: No   Drug use: No   Sexual activity: Yes  Other Topics Concern   Not on file  Social History Narrative   Not on file   Social Determinants of Health   Financial Resource Strain: Low Risk    Difficulty of Paying Living Expenses: Not hard at all  Food Insecurity: No Food Insecurity   Worried About Programme researcher, broadcasting/film/video in the Last Year: Never true   Ran Out of Food in the Last Year: Never true  Transportation Needs: No Transportation Needs   Lack of Transportation (Medical): No   Lack of Transportation (Non-Medical): No  Physical Activity: Inactive   Days of Exercise per Week: 0 days   Minutes of Exercise per Session: 0 min  Stress: No Stress Concern Present   Feeling of Stress : Only a little  Social Connections: Moderately Integrated   Frequency of Communication with Friends and Family: More than three times a week   Frequency of Social Gatherings with Friends and Family: Once a week   Attends Religious Services: More than 4 times per year   Active Member of [employer] or Organizations: No   Attends Banker Meetings: Never   Marital Status: Married     Family History: The patient's family history includes Anxiety disorder in his daughter;  Arthritis in his daughter; Congestive Heart Failure in his mother; Depression in his daughter; Diabetes in his son; Heart disease in his father and mother; Hyperlipidemia in his daughter, father, and son; Kidney disease in his father.  ROS:   Please see the history of present illness.     All other systems reviewed and are negative.  EKGs/Labs/Other Studies Reviewed:    The following studies were reviewed today:  Cardiac CTA 12/2019 IMPRESSION: 1. High coronary calcium score of 1602. This was 79th percentile for age and sex  matched control.   2. Normal coronary origin with right dominance.   3. Calcified plaque in the proximal RCA causing moderate stenosis (50-69%).   4. Calcified plaque in the proximal to mid LAD, mid to distal RCA causing mild stenosis (25-49%)   5. CAD-RADS 3. Moderate stenosis. Consider symptom-guided anti-ischemic pharmacotherapy as well as risk factor modification per guideline directed care. Additional analysis with CT FFR will be submitted and reported separately.    1. Left Main:  No significant stenosis.   2. LAD: No significant stenosis. 3. LCX: No significant stenosis. 4. RCA: No significant stenosis.   IMPRESSION: 1.  CT FFR analysis didn't show any significant stenosis.  Echo 2018 Study Conclusions   - Left ventricle: The cavity size was normal. Wall thickness was at    the upper limits of normal. Systolic function was normal. The    estimated ejection fraction was in the range of 55% to 60%.    Doppler parameters are consistent with abnormal left ventricular    relaxation (grade 1 diastolic dysfunction).  - Aortic valve: Trileaflet; moderately thickened, moderately    calcified leaflets. Transvalvular velocity was within the normal    range. There was no stenosis. There was mild regurgitation.  - Mitral valve: Mildly thickened leaflets .  - Right ventricle: The cavity size was normal. Wall thickness was    normal. Systolic function was  normal.   Myoview lexiscan 05/2016 Pharmacological myocardial perfusion imaging study with no significant  ischemia Normal wall motion, EF estimated at 72% No EKG changes concerning for ischemia at peak stress or in recovery. Low risk scan    EKG:  EKG is  ordered today.  The ekg ordered today demonstrates SB, LAD, nonspecific T wave changes.   Recent Labs: 06/16/2020: ALT 33; BUN 14; Creat 1.24; Hemoglobin 14.9; Platelets 118; Potassium 4.5; Sodium 143; TSH 2.32  Recent Lipid Panel    Component Value Date/Time   CHOL 119 05/19/2020 0929   TRIG 205 (H) 05/19/2020 0929   HDL 34 (L) 05/19/2020 0929   CHOLHDL 3.5 05/19/2020 0929   VLDL 41 (H) 05/19/2020 0929   LDLCALC 44 05/19/2020 0929   LDLCALC 85 05/04/2019 1002      Physical Exam:    VS:  BP 120/80 (BP Location: Left Arm, Patient Position: Sitting, Cuff Size: Normal)   Pulse (!) 58   Ht 5\' 8"  (1.727 m)   Wt 225 lb 6 oz (102.2 kg)   SpO2 98%   BMI 34.27 kg/m     Wt Readings from Last 3 Encounters:  11/20/20 225 lb 6 oz (102.2 kg)  10/15/20 225 lb (102.1 kg)  08/13/20 232 lb (105.2 kg)     GEN:  Well nourished, well developed in no acute distress HEENT: Normal NECK: No JVD; No carotid bruits LYMPHATICS: No lymphadenopathy CARDIAC: RRR, no murmurs, rubs, gallops RESPIRATORY:  Clear to auscultation without rales, wheezing or rhonchi  ABDOMEN: Soft, non-tender, non-distended MUSCULOSKELETAL:  trace edema; No deformity  SKIN: Warm and dry NEUROLOGIC:  Alert and oriented x 3 PSYCHIATRIC:  Normal affect   ASSESSMENT:    1. SOB (shortness of breath)   2. Coronary artery disease of native artery of native heart with stable angina pectoris (HCC)   3. Chronic heart failure with preserved ejection fraction (HFpEF) (HCC)   4. Other chest pain   5. Essential hypertension   6. Hyperlipidemia, mixed    PLAN:    In order of problems listed above:  Shortness of breath  and chest pain CAD Patient reports progressive  shortness of breath and chest pain. History of Coronary CTA in 2021 showing heavy coronary artery calcification as well as aortic atherosclerosis but no hemodynamically significant coronary stenosis. EKG today shows sinus bradycardia, 58bpm, with nonspecific T wave changes. I will order Myoview stress test and an echo. I will check BMET, CBC, and TSH. Continue ASA, statin, and BB. Lifestyle changes discussed in detail.  Chronic HFpEF  Appears relatively euvolemic on exam. Reports shortness of breath as above. Echo as above. Continue Coreg, Losartan and amlodipine. CHF education discussed in detail.  HTN BP wnl. Continue Coreg 3.125mg  BID, Losartan 25mg  daily and amlodipine 5mg  daily.  HLD LDL 44 04/2020. Continue Crestor.  OSA He reports compliance with CPAP.  Disposition: Follow up in 1 month(s) with MD/APP    Signed, Edrie Ehrich , PA-C  11/20/2020 3:14 PM    [facility]

## 2020-11-19 ENCOUNTER — Ambulatory Visit: Payer: Medicare Other | Admitting: Internal Medicine

## 2020-11-20 ENCOUNTER — Ambulatory Visit: Payer: Medicare Other | Admitting: Medical

## 2020-11-20 ENCOUNTER — Other Ambulatory Visit: Payer: Self-pay

## 2020-11-20 ENCOUNTER — Encounter: Payer: Self-pay | Admitting: Medical

## 2020-11-20 VITALS — BP 120/80 | HR 58 | Ht 68.0 in | Wt 225.4 lb

## 2020-11-20 DIAGNOSIS — R0602 Shortness of breath: Secondary | ICD-10-CM

## 2020-11-20 DIAGNOSIS — E782 Mixed hyperlipidemia: Secondary | ICD-10-CM | POA: Diagnosis not present

## 2020-11-20 DIAGNOSIS — I10 Essential (primary) hypertension: Secondary | ICD-10-CM

## 2020-11-20 DIAGNOSIS — R0789 Other chest pain: Secondary | ICD-10-CM

## 2020-11-20 DIAGNOSIS — I25118 Atherosclerotic heart disease of native coronary artery with other forms of angina pectoris: Secondary | ICD-10-CM

## 2020-11-20 DIAGNOSIS — I5032 Chronic diastolic (congestive) heart failure: Secondary | ICD-10-CM

## 2020-11-20 DIAGNOSIS — G4733 Obstructive sleep apnea (adult) (pediatric): Secondary | ICD-10-CM | POA: Diagnosis not present

## 2020-11-20 NOTE — Telephone Encounter (Signed)
Patient seen in office today by Terrilee Croak, PA.  See office note.

## 2020-11-20 NOTE — Patient Instructions (Signed)
Medication Instructions:  Your physician recommends that you continue on your current medications as directed. Please refer to the Current Medication list given to you today.  *If you need a refill on your cardiac medications before your next appointment, please call your pharmacy*   Lab Work: Bmp, Cbc, Tsh- Today  If you have labs (blood work) drawn today and your tests are completely normal, you will receive your results only by: MyChart Message (if you have MyChart) OR A paper copy in the mail If you have any lab test that is abnormal or we need to change your treatment, we will call you to review the results.   Testing/Procedures: Your physician has requested that you have an echocardiogram. Echocardiography is a painless test that uses sound waves to create images of your heart. It provides your doctor with information about the size and shape of your heart and how well your heart's chambers and valves are working. This procedure takes approximately one hour. There are no restrictions for this procedure.  Your physician has requested that you have a lexiscan myoview. For further information please visit [email or web address] Please follow instruction sheet, as given.   Follow-Up: At [facility], you and your health needs are our priority.  As part of our continuing mission to provide you with exceptional heart care, we have created designated Provider Care Teams.  These Care Teams include your primary Cardiologist (physician) and Advanced Practice Providers (APPs -  Physician Assistants and Nurse Practitioners) who all work together to provide you with the care you need, when you need it.  We recommend signing up for the patient portal called "MyChart".  Sign up information is provided on this After Visit Summary.  MyChart is used to connect with patients for Virtual Visits (Telemedicine).  Patients are able to view lab/test results, encounter notes, upcoming appointments, etc.   Non-urgent messages can be sent to your provider as well.   To learn more about what you can do with MyChart, go to [email or web address].    Your next appointment:   6 month(s)  The format for your next appointment:   In Person  Provider:   You may see Dr. Cristal Deer End or one of the following Advanced Practice Providers on your designated Care Team:   Nicolasa Ducking, NP Eula Listen, PA-C Marisue Ivan, PA-C Cadence Fransico Michael, [place]   Other Instructions [facility] MYOVIEW  Your caregiver has ordered a Stress Test with nuclear imaging. The purpose of this test is to evaluate the blood supply to your heart muscle. This procedure is referred to as a "Non-Invasive Stress Test." This is because other than having an IV started in your vein, nothing is inserted or "invades" your body. Cardiac stress tests are done to find areas of poor blood flow to the heart by determining the extent of coronary artery disease (CAD). Some patients exercise on a treadmill, which naturally increases the blood flow to your heart, while others who are  unable to walk on a treadmill due to physical limitations have a pharmacologic/chemical stress agent called Lexiscan . This medicine will mimic walking on a treadmill by temporarily increasing your coronary blood flow.   Please note: these test may take anywhere between 2-4 hours to complete  PLEASE REPORT TO [facility] MEDICAL MALL ENTRANCE  THE VOLUNTEERS AT THE FIRST DESK WILL DIRECT YOU WHERE TO GO  Date of Procedure:_____________________________________  Arrival Time for Procedure:______________________________  Instructions regarding medication:   __X__ : Hold diabetes medication morning of procedure  ___X_:  Hold betablocker(Carvedilol) night before procedure and morning of procedure  PLEASE NOTIFY THE OFFICE AT LEAST 24 HOURS IN ADVANCE IF YOU ARE UNABLE TO KEEP YOUR APPOINTMENT.  [phone] AND  PLEASE NOTIFY NUCLEAR MEDICINE AT [facility] AT LEAST 24 HOURS  IN ADVANCE IF YOU ARE UNABLE TO KEEP YOUR APPOINTMENT. [phone]  How to prepare for your Myoview test:  Do not eat or drink after midnight No caffeine for 24 hours prior to test No smoking 24 hours prior to test. Your medication may be taken with water.  If your doctor stopped a medication because of this test, do not take that medication. Ladies, please do not wear dresses.  Skirts or pants are appropriate. Please wear a short sleeve shirt. No perfume, cologne or lotion. Wear comfortable walking shoes. No heels!  Heart-Healthy Eating Plan Heart-healthy meal planning includes: Eating less unhealthy fats. Eating more healthy fats. Making other changes in your diet. Talk with your doctor or a diet specialist (dietitian) to create an eating plan that is right for you. What is my plan? Your doctor may recommend an eating plan that includes: Total fat: ______% or less of total calories a day. Saturated fat: ______% or less of total calories a day. Cholesterol: less than _________mg a day. What are tips for following this plan? Cooking Avoid frying your food. Try to bake, boil, grill, or broil it instead. You can also reduce fat by: Removing the skin from poultry. Removing all visible fats from meats. Steaming vegetables in water or broth. Meal planning  At meals, divide your plate into four equal parts: Fill one-half of your plate with vegetables and green salads. Fill one-fourth of your plate with whole grains. Fill one-fourth of your plate with lean protein foods. Eat 4-5 servings of vegetables per day. A serving of vegetables is: 1 cup of raw or cooked vegetables. 2 cups of raw leafy greens. Eat 4-5 servings of fruit per day. A serving of fruit is: 1 medium whole fruit.  cup of dried fruit.  cup of fresh, frozen, or canned fruit.  cup of 100% fruit juice. Eat more foods that have soluble fiber. These are apples, broccoli, carrots, beans, peas, and barley. Try to get  20-30 g of fiber per day. Eat 4-5 servings of nuts, legumes, and seeds per week: 1 serving of dried beans or legumes equals  cup after being cooked. 1 serving of nuts is  cup. 1 serving of seeds equals 1 tablespoon. General information Eat more home-cooked food. Eat less restaurant, buffet, and fast food. Limit or avoid alcohol. Limit foods that are high in starch and sugar. Avoid fried foods. Lose weight if you are overweight. Keep track of how much salt (sodium) you eat. This is important if you have high blood pressure. Ask your doctor to tell you more about this. Try to add vegetarian meals each week. Fats Choose healthy fats. These include olive oil and canola oil, flaxseeds, walnuts, almonds, and seeds. Eat more omega-3 fats. These include salmon, mackerel, sardines, tuna, flaxseed oil, and ground flaxseeds. Try to eat fish at least 2 times each week. Check food labels. Avoid foods with trans fats or high amounts of saturated fat. Limit saturated fats. These are often found in animal products, such as meats, butter, and cream. These are also found in plant foods, such as palm oil, palm kernel oil, and coconut oil. Avoid foods with partially hydrogenated oils in them. These have trans fats. Examples are stick margarine, some tub margarines, cookies,  crackers, and other baked goods. What foods can I eat? Fruits All fresh, canned (in natural juice), or frozen fruits. Vegetables Fresh or frozen vegetables (raw, steamed, roasted, or grilled). Green salads. Grains Most grains. Choose whole wheat and whole grains most of the time. Rice and pasta, including brown rice and pastas made with whole wheat. Meats and other proteins Lean, well-trimmed beef, veal, pork, and lamb. Chicken and [place] without skin. All fish and shellfish. Wild duck, rabbit, pheasant, and venison. Egg whites or low-cholesterol egg substitutes. Dried beans, peas, lentils, and tofu. Seeds and most  nuts. Dairy Low-fat or nonfat cheeses, including ricotta and mozzarella. Skim or 1% milk that is liquid, powdered, or evaporated. Buttermilk that is made with low-fat milk. Nonfat or low-fat yogurt. Fats and oils Non-hydrogenated (trans-free) margarines. Vegetable oils, including soybean, sesame, sunflower, olive, peanut, safflower, corn, canola, and cottonseed. Salad dressings or mayonnaise made with a vegetable oil. Beverages Mineral water. Coffee and tea. Diet carbonated beverages. Sweets and desserts Sherbet, gelatin, and fruit ice. Small amounts of dark chocolate. Limit all sweets and desserts. Seasonings and condiments All seasonings and condiments. The items listed above may not be a complete list of foods and drinks you can eat. Contact a dietitian for more options. What foods should I avoid? Fruits Canned fruit in heavy syrup. Fruit in cream or butter sauce. Fried fruit. Limit coconut. Vegetables Vegetables cooked in cheese, cream, or butter sauce. Fried vegetables. Grains Breads that are made with saturated or trans fats, oils, or whole milk. Croissants. Sweet rolls. Donuts. High-fat crackers, such as cheese crackers. Meats and other proteins Fatty meats, such as hot dogs, ribs, sausage, bacon, rib-eye roast or steak. High-fat deli meats, such as salami and bologna. Caviar. Domestic duck and goose. Organ meats, such as liver. Dairy Cream, sour cream, cream cheese, and creamed cottage cheese. Whole-milk cheeses. Whole or 2% milk that is liquid, evaporated, or condensed. Whole buttermilk. Cream sauce or high-fat cheese sauce. Yogurt that is made from whole milk. Fats and oils Meat fat, or shortening. Cocoa butter, hydrogenated oils, palm oil, coconut oil, palm kernel oil. Solid fats and shortenings, including bacon fat, salt pork, lard, and butter. Nondairy cream substitutes. Salad dressings with cheese or sour cream. Beverages Regular sodas and juice drinks with added  sugar. Sweets and desserts Frosting. Pudding. Cookies. Cakes. Pies. Milk chocolate or white chocolate. Buttered syrups. Full-fat ice cream or ice cream drinks. The items listed above may not be a complete list of foods and drinks to avoid. Contact a dietitian for more information. Summary Heart-healthy meal planning includes eating less unhealthy fats, eating more healthy fats, and making other changes in your diet. Eat a balanced diet. This includes fruits and vegetables, low-fat or nonfat dairy, lean protein, nuts and legumes, whole grains, and heart-healthy oils and fats. This information is not intended to replace advice given to you by your health care provider. Make sure you discuss any questions you have with your health care provider. Document Revised: 06/19/2020 Document Reviewed: 06/19/2020 Elsevier Patient Education  2022 Elsevier Inc.  Diabetes Mellitus and Nutrition, Adult When you have diabetes, or diabetes mellitus, it is very important to have healthy eating habits because your blood sugar (glucose) levels are greatly affected by what you eat and drink. Eating healthy foods in the right amounts, at about the same times every day, can help you: Control your blood glucose. Lower your risk of heart disease. Improve your blood pressure. Reach or maintain a healthy weight. What can  affect my meal plan? Every person with diabetes is different, and each person has different needs for a meal plan. Your health care provider may recommend that you work with a dietitian to make a meal plan that is best for you. Your meal plan may vary depending on factors such as: The calories you need. The medicines you take. Your weight. Your blood glucose, blood pressure, and cholesterol levels. Your activity level. Other health conditions you have, such as heart or kidney disease. How do carbohydrates affect me? Carbohydrates, also called carbs, affect your blood glucose level more than any other  type of food. Eating carbs naturally raises the amount of glucose in your blood. Carb counting is a method for keeping track of how many carbs you eat. Counting carbs is important to keep your blood glucose at a healthy level, especially if you use insulin or take certain oral diabetes medicines. It is important to know how many carbs you can safely have in each meal. This is different for every person. Your dietitian can help you calculate how many carbs you should have at each meal and for each snack. How does alcohol affect me? Alcohol can cause a sudden decrease in blood glucose (hypoglycemia), especially if you use insulin or take certain oral diabetes medicines. Hypoglycemia can be a life-threatening condition. Symptoms of hypoglycemia, such as sleepiness, dizziness, and confusion, are similar to symptoms of having too much alcohol. Do not drink alcohol if: Your health care provider tells you not to drink. You are pregnant, may be pregnant, or are planning to become pregnant. If you drink alcohol: Do not drink on an empty stomach. Limit how much you use to: 0-1 drink a day for women. 0-2 drinks a day for men. Be aware of how much alcohol is in your drink. In the [place] one drink equals one 12 oz bottle of beer (355 mL), one 5 oz glass of wine (148 mL), or one 1 oz glass of hard liquor (44 mL). Keep yourself hydrated with water, diet soda, or unsweetened iced tea. Keep in mind that regular soda, juice, and other mixers may contain a lot of sugar and must be counted as carbs. What are tips for following this plan? Reading food labels Start by checking the serving size on the "Nutrition Facts" label of packaged foods and drinks. The amount of calories, carbs, fats, and other nutrients listed on the label is based on one serving of the item. Many items contain more than one serving per package. Check the total grams (g) of carbs in one serving. You can calculate the number of servings of carbs in  one serving by dividing the total carbs by 15. For example, if a food has 30 g of total carbs per serving, it would be equal to 2 servings of carbs. Check the number of grams (g) of saturated fats and trans fats in one serving. Choose foods that have a low amount or none of these fats. Check the number of milligrams (mg) of salt (sodium) in one serving. Most people should limit total sodium intake to less than 2,300 mg per day. Always check the nutrition information of foods labeled as "low-fat" or "nonfat." These foods may be higher in added sugar or refined carbs and should be avoided. Talk to your dietitian to identify your daily goals for nutrients listed on the label. Shopping Avoid buying canned, pre-made, or processed foods. These foods tend to be high in fat, sodium, and added sugar. Shop around  the outside edge of the grocery store. This is where you will most often find fresh fruits and vegetables, bulk grains, fresh meats, and fresh dairy. Cooking Use low-heat cooking methods, such as baking, instead of high-heat cooking methods like deep frying. Cook using healthy oils, such as olive, canola, or sunflower oil. Avoid cooking with butter, cream, or high-fat meats. Meal planning Eat meals and snacks regularly, preferably at the same times every day. Avoid going long periods of time without eating. Eat foods that are high in fiber, such as fresh fruits, vegetables, beans, and whole grains. Talk with your dietitian about how many servings of carbs you can eat at each meal. Eat 4-6 oz (112-168 g) of lean protein each day, such as lean meat, chicken, fish, eggs, or tofu. One ounce (oz) of lean protein is equal to: 1 oz (28 g) of meat, chicken, or fish. 1 egg.  cup (62 g) of tofu. Eat some foods each day that contain healthy fats, such as avocado, nuts, seeds, and fish. What foods should I eat? Fruits Berries. Apples. Oranges. Peaches. Apricots. Plums. Grapes. Mango. Papaya. Pomegranate.  Kiwi. Cherries. Vegetables Lettuce. Spinach. Leafy greens, including kale, chard, collard greens, and mustard greens. Beets. Cauliflower. Cabbage. Broccoli. Carrots. Green beans. Tomatoes. Peppers. Onions. Cucumbers. Brussels sprouts. Grains Whole grains, such as whole-wheat or whole-grain bread, crackers, tortillas, cereal, and pasta. Unsweetened oatmeal. Quinoa. Brown or wild rice. Meats and other proteins Seafood. Poultry without skin. Lean cuts of poultry and beef. Tofu. Nuts. Seeds. Dairy Low-fat or fat-free dairy products such as milk, yogurt, and cheese. The items listed above may not be a complete list of foods and beverages you can eat. Contact a dietitian for more information. What foods should I avoid? Fruits Fruits canned with syrup. Vegetables Canned vegetables. Frozen vegetables with butter or cream sauce. Grains Refined white flour and flour products such as bread, pasta, snack foods, and cereals. Avoid all processed foods. Meats and other proteins Fatty cuts of meat. Poultry with skin. Breaded or fried meats. Processed meat. Avoid saturated fats. Dairy Full-fat yogurt, cheese, or milk. Beverages Sweetened drinks, such as soda or iced tea. The items listed above may not be a complete list of foods and beverages you should avoid. Contact a dietitian for more information. Questions to ask a health care provider Do I need to meet with a diabetes educator? Do I need to meet with a dietitian? What number can I call if I have questions? When are the best times to check my blood glucose? Where to find more information: American Diabetes Association: diabetes.org Academy of Nutrition and Dietetics: www.eatright.[employer] of Diabetes and Digestive and Kidney Diseases: CarFlippers.tn Association of Diabetes Care and Education Specialists: www.diabeteseducator.org Summary It is important to have healthy eating habits because your blood sugar (glucose) levels are  greatly affected by what you eat and drink. A healthy meal plan will help you control your blood glucose and maintain a healthy lifestyle. Your health care provider may recommend that you work with a dietitian to make a meal plan that is best for you. Keep in mind that carbohydrates (carbs) and alcohol have immediate effects on your blood glucose levels. It is important to count carbs and to use alcohol carefully. This information is not intended to replace advice given to you by your health care provider. Make sure you discuss any questions you have with your health care provider. Document Revised: 01/16/2019 Document Reviewed: 01/16/2019 Elsevier Patient Education  2021 [employer].

## 2020-11-21 LAB — BASIC METABOLIC PANEL
BUN/Creatinine Ratio: 13 (ref 10–24)
BUN: 16 mg/dL (ref 8–27)
CO2: 17 mmol/L — ABNORMAL LOW (ref 20–29)
Calcium: 9.8 mg/dL (ref 8.6–10.2)
Chloride: 105 mmol/L (ref 96–106)
Creatinine, Ser: 1.19 mg/dL (ref 0.76–1.27)
Glucose: 92 mg/dL (ref 70–99)
Potassium: 4.2 mmol/L (ref 3.5–5.2)
Sodium: 140 mmol/L (ref 134–144)
eGFR: 61 mL/min/{1.73_m2} (ref >59)

## 2020-11-21 LAB — CBC WITH DIFFERENTIAL/PLATELET
Basophils Absolute: 0.1 10*3/uL (ref 0.0–0.2)
Basos: 1 %
EOS (ABSOLUTE): 0.3 10*3/uL (ref 0.0–0.4)
Eos: 4 %
Hematocrit: 40.8 % (ref 37.5–51.0)
Hemoglobin: 14.3 g/dL (ref 13.0–17.7)
Immature Grans (Abs): 0 10*3/uL (ref 0.0–0.1)
Immature Granulocytes: 1 %
Lymphocytes Absolute: 1.9 10*3/uL (ref 0.7–3.1)
Lymphs: 28 %
MCH: 33.8 pg — ABNORMAL HIGH (ref 26.6–33.0)
MCHC: 35 g/dL (ref 31.5–35.7)
MCV: 97 fL (ref 79–97)
Monocytes Absolute: 0.6 10*3/uL (ref 0.1–0.9)
Monocytes: 9 %
Neutrophils Absolute: 3.8 10*3/uL (ref 1.4–7.0)
Neutrophils: 57 %
Platelets: 161 10*3/uL (ref 150–450)
RBC: 4.23 x10E6/uL (ref 4.14–5.80)
RDW: 13.8 % (ref 11.6–15.4)
WBC: 6.7 10*3/uL (ref 3.4–10.8)

## 2020-11-21 LAB — TSH: TSH: 2.52 u[IU]/mL (ref 0.450–4.500)

## 2020-11-26 ENCOUNTER — Encounter
Admission: RE | Admit: 2020-11-26 | Discharge: 2020-11-26 | Disposition: A | Discharge status: Home / Self Care | Payer: Medicare Other | Source: Ambulatory Visit | Attending: Medical | Admitting: Medical

## 2020-11-26 ENCOUNTER — Other Ambulatory Visit: Payer: Self-pay

## 2020-11-26 ENCOUNTER — Ambulatory Visit: Payer: Medicare Other | Admitting: Physician Assistant

## 2020-11-26 DIAGNOSIS — R0789 Other chest pain: Secondary | ICD-10-CM | POA: Insufficient documentation

## 2020-11-26 LAB — NM MYOCAR MULTI W/SPECT W/WALL MOTION / EF
Estimated workload: 1
Exercise duration (min): 0 min
Exercise duration (sec): 0 s
LV dias vol: 46 mL (ref 62–150)
LV sys vol: 8 mL
MPHR: 139 {beats}/min
Nuc Stress EF: 83 %
Peak HR: 82 {beats}/min
Percent HR: 58 %
Rest HR: 65 {beats}/min
Rest Nuclear Isotope Dose: 10.2 mCi
ST Depression (mm): 0 mm
Stress Nuclear Isotope Dose: 31.3 mCi
TID: 1.17

## 2020-11-26 MED ORDER — TECHNETIUM TC 99M TETROFOSMIN IV KIT
30.0000 | PACK | Freq: Once | INTRAVENOUS | Status: AC
  Administered 2020-11-26: 31.32 via INTRAVENOUS

## 2020-11-26 MED ORDER — REGADENOSON 0.4 MG/5ML IV SOLN
0.4000 mg | Freq: Once | INTRAVENOUS | Status: AC
  Administered 2020-11-26: 0.4 mg via INTRAVENOUS

## 2020-11-26 MED ORDER — TECHNETIUM TC 99M TETROFOSMIN IV KIT
10.0000 | PACK | Freq: Once | INTRAVENOUS | Status: AC | PRN
  Administered 2020-11-26: 10.23 via INTRAVENOUS

## 2020-12-18 ENCOUNTER — Ambulatory Visit (INDEPENDENT_AMBULATORY_CARE_PROVIDER_SITE_OTHER): Payer: Medicare Other

## 2020-12-18 DIAGNOSIS — Z23 Encounter for immunization: Secondary | ICD-10-CM | POA: Diagnosis not present

## 2020-12-18 NOTE — Progress Notes (Signed)
Vaccination tolerated well with no immediate adverse reaction noted, Patient verbalized understanding and intent to give our office a call with any questions or concerns or report to the nearest emergency department with any symptoms of allergic reaction. 

## 2020-12-25 ENCOUNTER — Telehealth: Payer: Self-pay

## 2020-12-25 ENCOUNTER — Ambulatory Visit (INDEPENDENT_AMBULATORY_CARE_PROVIDER_SITE_OTHER): Payer: Medicare Other

## 2020-12-25 ENCOUNTER — Other Ambulatory Visit: Payer: Self-pay

## 2020-12-25 DIAGNOSIS — R0789 Other chest pain: Secondary | ICD-10-CM | POA: Diagnosis not present

## 2020-12-25 DIAGNOSIS — R0602 Shortness of breath: Secondary | ICD-10-CM

## 2020-12-25 LAB — ECHOCARDIOGRAM COMPLETE
AR max vel: 2.98 cm2
AV Area VTI: 3.32 cm2
AV Area mean vel: 2.93 cm2
AV Mean grad: 4 mmHg
AV Peak grad: 7.5 mmHg
Ao pk vel: 1.37 m/s
Area-P 1/2: 3.19 cm2
MV VTI: 2.7 cm2
P 1/2 time: 833 msec
S' Lateral: 3.7 cm
Single Plane A4C EF: 58.4 %

## 2020-12-25 MED ORDER — PERFLUTREN LIPID MICROSPHERE
1.0000 mL | INTRAVENOUS | Status: AC | PRN
Start: 2020-12-25 — End: 2020-12-25
  Administered 2020-12-25: 2 mL via INTRAVENOUS

## 2020-12-25 NOTE — Progress Notes (Signed)
Chronic Care Management Pharmacy Assistant   Name: HIAWATHA MERRIOTT  MRN: [id number] DOB: October 24, 1940  Reason for Encounter:Hypertension Disease State Call.   Recent office visits:  10/15/2020 Dr.Sowles MD (PCP) No medication changes noted, A1C was Completed  Recent consult visits:  11/20/2020 Cadence Lorna Few (Cardiology) No medication changes noted, Follow up in 1 month  Hospital visits:  None in previous 6 months  Medications: Outpatient Encounter Medications as of 12/25/2020  Medication Sig   acetaminophen (TYLENOL) 500 MG tablet Take 500 mg by mouth every 6 (six) hours as needed.   allopurinol (ZYLOPRIM) 100 MG tablet Take 100 mg by mouth 2 (two) times daily.   amLODipine (NORVASC) 5 MG tablet TAKE 1 TABLET BY MOUTH  DAILY   Apoaequorin (PREVAGEN PO) Take by mouth.   Ascorbic Acid (VITAMIN C) 1000 MG tablet Take 1,000 mg by mouth daily.   aspirin EC 81 MG tablet Take 81 mg by mouth daily.   carvedilol (COREG) 3.125 MG tablet TAKE 1 TABLET BY MOUTH  TWICE DAILY   Cholecalciferol (VITAMIN D3) 25 MCG (1000 UT) CAPS Take 1,000 Units by mouth daily.   Coenzyme Q10 (CO Q 10) 100 MG CAPS Take 100 mg by mouth daily.   famotidine (PEPCID) 20 MG tablet TAKE 1 TABLET BY MOUTH  DAILY   levothyroxine (SYNTHROID) 25 MCG tablet TAKE 1 TABLET BY MOUTH IN  THE MORNING   liraglutide (VICTOZA) 18 MG/3ML SOPN Inject 1.2 mg into the skin daily.   losartan (COZAAR) 25 MG tablet TAKE 1 TABLET BY MOUTH  DAILY   Multiple Vitamins-Minerals (CENTRUM SILVER PO) Take 200 mg by mouth daily.   Needle, Disp, 32G X 5/16" MISC 6 mm by Does not apply route 2 (two) times daily.   Omega-3 Fatty Acids (FISH OIL) 1000 MG CAPS Take 1,000 mg by mouth daily.   rosuvastatin (CRESTOR) 20 MG tablet TAKE 1 TABLET BY MOUTH  DAILY   Turmeric 500 MG CAPS Take 500 mg by mouth daily.   No facility-administered encounter medications on file as of 12/25/2020.    Care Gaps: Shingrix Vaccine COVID-19 Vaccnie Ophthalmology  Exam  Star Rating Drugs: Rosuvastatin 20 mg  last filled 07/17/2020 for 90 day supply (No pharmacy noted) Losartan 25 mg last filled 08/13/2020 for 90 day supply (No pharmacy noted) Victoza 18 MG  last filled 03/26/2020 for 90 day supply (No pharmacy noted - receive through [employer]) Medication Fill Gaps: Amlodipine 5 mg last filled 08/13/2020 for 90 day supply Allopurinol 100 mg last filled 08/13/2020 for 90 day supply Carvedilol 3.125 mg last filled 08/13/2020 for 90 day supply Famotidine 20 mg last filled 08/13/2020 for 90 day supply Levothyroxine 25 mcg last filled 08/13/2020 for 90 day supply  Reviewed chart prior to disease state call. Spoke with patient regarding BP  Recent Office Vitals: BP Readings from Last 3 Encounters:  11/20/20 120/80  10/15/20 122/84  08/13/20 114/72   Pulse Readings from Last 3 Encounters:  11/20/20 (!) 58  10/15/20 86  08/13/20 (!) 57    Wt Readings from Last 3 Encounters:  11/20/20 225 lb 6 oz (102.2 kg)  10/15/20 225 lb (102.1 kg)  08/13/20 232 lb (105.2 kg)     Kidney Function Lab Results  Component Value Date/Time   CREATININE 1.19 11/20/2020 12:12 PM   CREATININE 1.24 (H) 06/16/2020 10:13 AM   CREATININE 1.32 (H) 01/08/2020 01:56 PM   CREATININE 1.22 (H) 05/04/2019 10:02 AM   GFRNONAA 55 (L) 06/16/2020 10:13  AM   GFRAA 63 06/16/2020 10:13 AM    BMP Latest Ref Rng & Units 11/20/2020 06/16/2020 01/08/2020  Glucose 70 - 99 mg/dL 92 423(N) 361(W)  BUN 8 - 27 mg/dL 16 14 17   Creatinine 0.76 - 1.27 mg/dL 4.31) 5.40(G)  BUN/Creat Ratio 10 - 24 13 11  -  Sodium 134 - 144 mmol/L 140 143 138  Potassium 3.5 - 5.2 mmol/L 4.2 4.5 4.1  Chloride 96 - 106 mmol/L 105 108 104  CO2 20 - 29 mmol/L 17(L) 27 23  Calcium 8.6 - 10.2 mg/dL 9.8 9.6 9.6    Current antihypertensive regimen:  Amlodipine 5 mg daily  Carvedilol 3.125 mg twice daily  Losartan 25 mg daily  Patient denies any headaches,dizziness or lightheadedness recently, but  was experiencing dizziness a few weeks ago. How often are you checking your Blood Pressure? daily Current home BP readings:  On 12/24/2020 it was 117/74 with a pulse of 66. On 12/23/2020 it was 90/58 with a pulse of 66. On 12/19/2020 it was 136/65 with a pulse of 65. On 12/17/2020 it was 107/62 with a pulse of 65. What recent interventions/DTPs have been made by any provider to improve Blood Pressure control since last CPP Visit: None ID Any recent hospitalizations or ED visits since last visit with CPP? No What diet changes have been made to improve Blood Pressure Control?  Patient denies any changes with his diet, and states he receives plenty of water through out the day. What exercise is being done to improve your Blood Pressure Control?  Patient reports he is walking more then he was.  Patient denies gaining weight, "I usually weigh myself daily in the morning when I wake up, but I have forgotten to this week".  Patient reports he takes his medication after breakfast to avoid feeling nauseous which has help a lot.  Adherence Review: Is the patient currently on ACE/ARB medication? Yes Does the patient have >5 day gap between last estimated fill dates? Yes  Adherence Issue: Losartan 25 mg last filled 08/13/2020 for 90 day supply  Patient denies any issue/side effect from Losartan.Patient reports taking Losartan daily after breakfast.Patient states he received a 90 day supply from [facility] on 12/22/2020.  Per [facility], Losartan was last filled on 12/22/2020 for 90 day supply. Per [facility], Rosuvastatin was last filled on 10/07/2020 for 90 day supply, and has a new order pending to be ship this week for a 90 day supply. Per [facility], Victoza was last filled on 03/26/2020 for 90 day supply.    Per [facility], Amlodipine was last filled on 11/07/2020 for 90 day suppy. Per [facility], Allopurinol 100 mg last filled 08/13/2020 for 90 day supply Per [facility],,Carvedilol 3.125 mg last filled  08/13/2020 for 90 day supply Per [facility],,Famotidine 20 mg last filled 08/13/2020 for 90 day supply Per [facility],,Levothyroxine 25 mcg last filled 08/15/2020 for 90 day supply  Per [facility] patient has 1 refill remaining for the all his medications for a 90 day supply.  Patient assistance renewal for Victoza: I received a task from 08/15/2020, CPP requesting that I start the renewal application for patient assistance on the medication Victoza to continue assistance through year 2023.    Spoke with the patient and he  requested that the application mailed.I informed  him once he receives the application he will need to complete his part of the application and return it to his PCP office for Angelena Sole, CPP to fax over  to [employer] for processing.Informed patient to include a copy of his proof of income.  Patient verbalized understanding and was provided my phone number of [phone] if he has any questions.   Application emailed to Angelena Sole, CPP for review and to mail to patient home.    Everlean Cherry Clinical Pharmacist Assistant [phone]

## 2021-01-08 DIAGNOSIS — G4733 Obstructive sleep apnea (adult) (pediatric): Secondary | ICD-10-CM | POA: Diagnosis not present

## 2021-01-08 DIAGNOSIS — I10 Essential (primary) hypertension: Secondary | ICD-10-CM | POA: Diagnosis not present

## 2021-01-20 DIAGNOSIS — E1021 Type 1 diabetes mellitus with diabetic nephropathy: Secondary | ICD-10-CM | POA: Diagnosis not present

## 2021-01-26 DIAGNOSIS — N1831 Chronic kidney disease, stage 3a: Secondary | ICD-10-CM | POA: Diagnosis not present

## 2021-01-26 DIAGNOSIS — E1129 Type 2 diabetes mellitus with other diabetic kidney complication: Secondary | ICD-10-CM | POA: Diagnosis not present

## 2021-01-26 DIAGNOSIS — N281 Cyst of kidney, acquired: Secondary | ICD-10-CM | POA: Diagnosis not present

## 2021-01-26 DIAGNOSIS — I10 Essential (primary) hypertension: Secondary | ICD-10-CM | POA: Diagnosis not present

## 2021-02-10 ENCOUNTER — Telehealth: Payer: Self-pay

## 2021-02-10 NOTE — Progress Notes (Signed)
° ° °  Chronic Care Management Pharmacy Assistant   Name: Drew [name]  MRN: [id number] DOB: 05-04-1940  Reason for Encounter: Medication Review/Patient assistance renewal update for Victoza.   Recent office visits:  No recent office visit  Recent consult visits:  01/26/2021 Dr. Thedore Mins MD (Nephrology) No medication changes noted, return in 6 months.  Hospital visits:  None in previous 6 months  Medications: Outpatient Encounter Medications as of 02/10/2021  Medication Sig   acetaminophen (TYLENOL) 500 MG tablet Take 500 mg by mouth every 6 (six) hours as needed.   allopurinol (ZYLOPRIM) 100 MG tablet Take 100 mg by mouth 2 (two) times daily.   amLODipine (NORVASC) 5 MG tablet TAKE 1 TABLET BY MOUTH  DAILY   Apoaequorin (PREVAGEN PO) Take by mouth.   Ascorbic Acid (VITAMIN C) 1000 MG tablet Take 1,000 mg by mouth daily.   aspirin EC 81 MG tablet Take 81 mg by mouth daily.   carvedilol (COREG) 3.125 MG tablet TAKE 1 TABLET BY MOUTH  TWICE DAILY   Cholecalciferol (VITAMIN D3) 25 MCG (1000 UT) CAPS Take 1,000 Units by mouth daily.   Coenzyme Q10 (CO Q 10) 100 MG CAPS Take 100 mg by mouth daily.   famotidine (PEPCID) 20 MG tablet TAKE 1 TABLET BY MOUTH  DAILY   levothyroxine (SYNTHROID) 25 MCG tablet TAKE 1 TABLET BY MOUTH IN  THE MORNING   liraglutide (VICTOZA) 18 MG/3ML SOPN Inject 1.2 mg into the skin daily.   losartan (COZAAR) 25 MG tablet TAKE 1 TABLET BY MOUTH  DAILY   Multiple Vitamins-Minerals (CENTRUM SILVER PO) Take 200 mg by mouth daily.   Needle, Disp, 32G X 5/16" MISC 6 mm by Does not apply route 2 (two) times daily.   Omega-3 Fatty Acids (FISH OIL) 1000 MG CAPS Take 1,000 mg by mouth daily.   rosuvastatin (CRESTOR) 20 MG tablet TAKE 1 TABLET BY MOUTH  DAILY   Turmeric 500 MG CAPS Take 500 mg by mouth daily.   No facility-administered encounter medications on file as of 02/10/2021.    Care Gaps: Shingrix Vaccine COVID-19 Vaccnie Ophthalmology Exam   Star Rating  Drugs: Rosuvastatin 20 mg  last filled 10/06/2020 for 90 day supply (No pharmacy noted) Losartan 25 mg last filled 08/13/2020 for 90 day supply (No pharmacy noted) Victoza 18 MG  last filled 03/26/2020 for 90 day supply (No pharmacy noted - receive through [employer])  Medication Fill Gaps: Amlodipine 5 mg last filled 11/06/2020 for 90 day supply Allopurinol 100 mg last filled 08/13/2020 for 90 day supply Carvedilol 3.125 mg last filled 08/13/2020 for 90 day supply Famotidine 20 mg last filled 08/13/2020 for 90 day supply Levothyroxine 25 mcg last filled 08/13/2020 for 90 day supply  Update on patient assistance application renewal for Victoza for year 2023..  I reach out to [employer] to get a update on patient application for Victoza for year 2023.Per [employer], they have not process his application yet and she was unsure why, but will process within 24 hrs.Per [employer], I can check back in with therm Next week.   Everlean Cherry Clinical Pharmacist Assistant [phone]

## 2021-02-13 NOTE — Progress Notes (Signed)
Name: Drew [name]   MRN: [id number]    DOB: [birth year]   Date:02/17/2021       Progress Note  Subjective  Chief Complaint  Follow Up  HPI  DMII: he has obesity, dyslipidemia, HTN and CKI. His weight is going down, but BMI is now below 35, A1C had  gone from 6.2 % to 6.9 %, 6.3% , 6.4% today is 6.5 %  He is still taking Victoza daily and denies hypoglycemic episodes glucose usually in the 130-150's. Denies polyphagia, polyuria or polydipsia.  He is on ARB and statin therapy. He denies side effects of medication . He has noticed some pruritis on lower extremity intermittently. Discussed it may be neuropahty   Thrombocytopenia: last level improved from 118 to 161   OSA: he is complaint with CPAP He sleeps well through the night . He wakes up feeling rested.   HTN: bp is at goal, he has intermittent chest pain, on ARB  and beta blocker and norvasc. He denies dizziness, no orthopnea, but has intermittent chest pain and also sob with moderate activity. He states last episode of chest pain was a couple of months ago   Dyslipidemia: he is doing well on medication, last LDL was down to 44. He is under the care of Dr. Saunders Revel recently and cardiac angiogram was abnormal,  CHF :under the care of Dr. Saunders Revel, no orthopnea, wheezing but he has intermittent sob with moderate activity . He has intermittent lower extremity edema . He is on beta-blocker and ARB, takes aspirin daily .  Rash: red, itchy and small on left lower leg, never had it before.   Stable Angina/Atherosclerosis aorta : he has intermittent chest pain but intermittent only,  under the care of cardiologist, he does not have NTG at home. He states he has been doing well lately, last episode of chest pain was a couple of months ago,  taking higher dose of Crestor and denies side effects.   Obesity  BMI is now below  35 , continue regular physical activity but he has to cut down on sugary snacks.   Controlled gout: on allopurinol , no recent  episodes, uric acid below 6. Unchanged   CKI stage III: on ARB, sees Dr. Candiss Norse, no pruritus, good urine output, last GFR improved BP is at goal .  Hypothyroidism: on the same dose of levothyroxine , denies dysphagia, dry skin or change in bowel movements Unchanged   History of gastric ulcer: denies blood in stools or melena, normal appetite, no epigastric pain . He is on Pepcid   Patient Active Problem List   Diagnosis Date Noted   Coronary artery disease of native artery of native heart with stable angina pectoris ([place]) 08/14/2020   Aortic atherosclerosis ([place]) 08/14/2020   Atrophy of kidney 10/10/2019   Edema of lower extremity 10/10/2019   Thrombocytopenia ([place]) 08/13/2019   Diabetes mellitus type 2 in obese ([place]) 08/13/2019   Hyperlipidemia associated with type 2 diabetes mellitus ([place]) 08/13/2019   Neuropathy involving both lower extremities 05/04/2019   Polyneuropathy, unspecified 05/04/2019   Multiple renal cysts 04/09/2019   Stable angina ([place]) 12/29/2018   Onychomycosis of multiple toenails with type 2 diabetes mellitus ([place]) 11/30/2017   Chronic heart failure with preserved ejection fraction (HFpEF) ([place]) 10/20/2017   Obstructive sleep apnea 02/10/2017   Abnormal ankle brachial index (ABI) 11/26/2015   Stage 3a chronic kidney disease ([place]) 08/15/2015   H/O: upper GI bleed    Hypothyroidism due to acquired atrophy  of thyroid 03/11/2015   History of prostatitis 11/06/2014   Essential hypertension 11/06/2014   Mixed hyperlipidemia 11/06/2014   Flat feet, bilateral 11/06/2014   Chronic multiple gastric erosions 11/06/2014   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 ([place]) 11/06/2014    Past Surgical History:  Procedure Laterality Date   CATARACT EXTRACTION, BILATERAL Bilateral 11/18/2017   COLONOSCOPY N/A 12/27/2013   Procedure: COLONOSCOPY;  Surgeon: Missy Sabins, MD;  Location: [place];  Service: Endoscopy;  Laterality: N/A;   ENTEROSCOPY  N/A 12/21/2013   Procedure: ENTEROSCOPY;  Surgeon: Winfield Cunas., MD;  Location[facility] ENDOSCOPY;  Service: Endoscopy;  Laterality: N/A;   ENTEROSCOPY N/A 12/27/2013   Procedure: ENTEROSCOPY;  Surgeon: Missy Sabins, MD;  Location: [facility] ENDOSCOPY;  Service: Endoscopy;  Laterality: N/A;    Family History  Problem Relation Age of Onset   Hyperlipidemia Father    Kidney disease Father    Heart disease Father    Congestive Heart Failure Mother    Heart disease Mother    Hyperlipidemia Daughter    Hyperlipidemia Son    Diabetes Son    Anxiety disorder Daughter    Depression Daughter    Arthritis Daughter     Social History   Tobacco Use   Smoking status: Former    Years: 1.00    Types: Cigarettes, Cigars    Quit date: 1990    Years since quitting: 33.0   Smokeless tobacco: Never   Tobacco comments:    Smoked afternoon cigar  Substance Use Topics   Alcohol use: No     Current Outpatient Medications:    acetaminophen (TYLENOL) 500 MG tablet, Take 500 mg by mouth every 6 (six) hours as needed., Disp: , Rfl:    allopurinol (ZYLOPRIM) 100 MG tablet, Take 100 mg by mouth 2 (two) times daily., Disp: , Rfl:    amLODipine (NORVASC) 5 MG tablet, TAKE 1 TABLET BY MOUTH  DAILY, Disp: 90 tablet, Rfl: 3   Apoaequorin (PREVAGEN PO), Take by mouth., Disp: , Rfl:    Ascorbic Acid (VITAMIN C) 1000 MG tablet, Take 1,000 mg by mouth daily., Disp: , Rfl:    aspirin EC 81 MG tablet, Take 81 mg by mouth daily., Disp: , Rfl:    carvedilol (COREG) 3.125 MG tablet, TAKE 1 TABLET BY MOUTH  TWICE DAILY, Disp: 180 tablet, Rfl: 3   Cholecalciferol (VITAMIN D3) 25 MCG (1000 UT) CAPS, Take 1,000 Units by mouth daily., Disp: , Rfl:    Coenzyme Q10 (CO Q 10) 100 MG CAPS, Take 100 mg by mouth daily., Disp: , Rfl:    famotidine (PEPCID) 20 MG tablet, TAKE 1 TABLET BY MOUTH  DAILY, Disp: 90 tablet, Rfl: 3   levothyroxine (SYNTHROID) 25 MCG tablet, TAKE 1 TABLET BY MOUTH IN  THE MORNING, Disp: 90 tablet, Rfl:  2   liraglutide (VICTOZA) 18 MG/3ML SOPN, Inject 1.2 mg into the skin daily., Disp: 27 mL, Rfl: 1   losartan (COZAAR) 25 MG tablet, TAKE 1 TABLET BY MOUTH  DAILY, Disp: 90 tablet, Rfl: 3   Multiple Vitamins-Minerals (CENTRUM SILVER PO), Take 200 mg by mouth daily., Disp: , Rfl:    Needle, Disp, 32G X 5/16" MISC, 6 mm by Does not apply route 2 (two) times daily., Disp: 100 each, Rfl: 3   Omega-3 Fatty Acids (FISH OIL) 1000 MG CAPS, Take 1,000 mg by mouth daily., Disp: , Rfl:    rosuvastatin (CRESTOR) 20 MG tablet, TAKE 1 TABLET  BY MOUTH  DAILY, Disp: 90 tablet, Rfl: 2   Turmeric 500 MG CAPS, Take 500 mg by mouth daily., Disp: , Rfl:   Allergies  Allergen Reactions   Niacin And Related Itching and Rash    I personally reviewed active problem list, medication list, allergies, family history, social history, health maintenance with the patient/caregiver today.   ROS  Constitutional: Negative for fever or weight change.  Respiratory: Negative for cough but has intermittent  shortness of breath.   Cardiovascular: Negative for chest pain or palpitations.  Gastrointestinal: Negative for abdominal pain, no bowel changes.  Musculoskeletal: Negative for gait problem or joint swelling.  Skin: positive  for rash.  Neurological: Negative for dizziness or headache.  No other specific complaints in a complete review of systems (except as listed in HPI above).   Objective  Vitals:   02/17/21 0949  BP: 136/74  Pulse: 72  Resp: 16  Temp: 98 F (36.7 C)  SpO2: 97%  Weight: 229 lb (103.9 kg)  Height: 5' 8"  (1.727 m)    Body mass index is 34.82 kg/m.  Physical Exam  Constitutional: Patient appears well-developed and well-nourished. Obese  No distress.  HEENT: head atraumatic, normocephalic, pupils equal and reactive to light, neck supple, throat within normal limits Cardiovascular: Normal rate, regular rhythm and normal heart sounds.  No murmur heard. No BLE edema. Skin: round,  erythematous rash, borders well demarcated , dry on left lower extremity  Pulmonary/Chest: Effort normal and breath sounds normal. No respiratory distress. Abdominal: Soft.  There is no tenderness. Psychiatric: Patient has a normal mood and affect. behavior is normal. Judgment and thought content normal.   Recent Results (from the past 2160 hour(s))  Basic Metabolic Panel (BMET)     Status: Abnormal   Collection Time: 11/20/20 12:12 PM  Result Value Ref Range   Glucose 92 70 - 99 mg/dL    Comment:               **Please note reference interval change**   BUN 16 8 - 27 mg/dL   Creatinine, Ser 1.19 0.76 - 1.27 mg/dL   eGFR 61 >59 mL/min/1.73   BUN/Creatinine Ratio 13 10 - 24   Sodium 140 134 - 144 mmol/L   Potassium 4.2 3.5 - 5.2 mmol/L   Chloride 105 96 - 106 mmol/L   CO2 17 (L) 20 - 29 mmol/L   Calcium 9.8 8.6 - 10.2 mg/dL  CBC with Differential/Platelet     Status: Abnormal   Collection Time: 11/20/20 12:12 PM  Result Value Ref Range   WBC 6.7 3.4 - 10.8 x10E3/uL   RBC 4.23 4.14 - 5.80 x10E6/uL   Hemoglobin 14.3 13.0 - 17.7 g/dL   Hematocrit 40.8 37.5 - 51.0 %   MCV 97 79 - 97 fL   MCH 33.8 (H) 26.6 - 33.0 pg   MCHC 35.0 31.5 - 35.7 g/dL   RDW 13.8 11.6 - 15.4 %   Platelets 161 150 - 450 x10E3/uL   Neutrophils 57 Not Estab. %   Lymphs 28 Not Estab. %   Monocytes 9 Not Estab. %   Eos 4 Not Estab. %   Basos 1 Not Estab. %   Neutrophils Absolute 3.8 1.4 - 7.0 x10E3/uL   Lymphocytes Absolute 1.9 0.7 - 3.1 x10E3/uL   Monocytes Absolute 0.6 0.1 - 0.9 x10E3/uL   EOS (ABSOLUTE) 0.3 0.0 - 0.4 x10E3/uL   Basophils Absolute 0.1 0.0 - 0.2 x10E3/uL   Immature Granulocytes 1 Not Estab. %  Immature Grans (Abs) 0.0 0.0 - 0.1 x10E3/uL  TSH     Status: None   Collection Time: 11/20/20 12:12 PM  Result Value Ref Range   TSH 2.520 0.450 - 4.500 uIU/mL  NM Myocar Multi W/Spect W/Wall Motion / EF     Status: None   Collection Time: 11/26/20  1:54 PM  Result Value Ref Range   Rest HR  65.0 bpm   Rest BP 150/85 mmHg   Exercise duration (min) 0 min   Exercise duration (sec) 0 sec   Estimated workload 1.0    Peak HR 82 bpm   Peak BP 150/85 mmHg   MPHR 139 bpm   Percent HR 58.0 %   ST Depression (mm) 0 mm   Rest Nuclear Isotope Dose 10.2 mCi   Stress Nuclear Isotope Dose 31.3 mCi   TID 1.17    LV sys vol 8.0 mL   LV dias vol 46.0 62 - 150 mL   Nuc Stress EF 83 %  ECHOCARDIOGRAM COMPLETE     Status: None   Collection Time: 12/25/20 11:24 AM  Result Value Ref Range   AR max vel 2.98 cm2   AV Peak grad 7.5 mmHg   Ao pk vel 1.37 m/s   S' Lateral 3.70 cm   Area-P 1/2 3.19 cm2   AV Area VTI 3.32 cm2   AV Mean grad 4.0 mmHg   Single Plane A4C EF 58.4 %   P 1/2 time 833 msec   AV Area mean vel 2.93 cm2   MV VTI 2.70 cm2     PHQ2/9: Depression screen [facility] 2/9 02/17/2021 10/15/2020 06/16/2020 04/15/2020 02/12/2020  Decreased Interest 0 0 0 0 0  Down, Depressed, Hopeless 0 0 0 0 0  PHQ - 2 Score 0 0 0 0 0  Altered sleeping 0 - - - -  Tired, decreased energy 0 - - - -  Change in appetite 0 - - - -  Feeling bad or failure about yourself  0 - - - -  Trouble concentrating 0 - - - -  Moving slowly or fidgety/restless 0 - - - -  Suicidal thoughts 0 - - - -  PHQ-9 Score 0 - - - -  Difficult doing work/chores - - - - -  Some recent data might be hidden    phq 9 is negative   Fall Risk: Fall Risk  02/17/2021 10/15/2020 06/16/2020 04/15/2020 02/12/2020  Falls in the past year? 1 1 1  0 1  Number falls in past yr: 1 1 1 1 1   Comment - - - - -  Injury with Fall? 0 0 0 0 0  Comment - - - - -  Risk for fall due to : No Fall Risks - - History of fall(s) -  Follow up Falls prevention discussed Falls prevention discussed - Falls prevention discussed -      Functional Status Survey: Is the patient deaf or have difficulty hearing?: No Does the patient have difficulty seeing, even when wearing glasses/contacts?: No Does the patient have difficulty concentrating,  remembering, or making decisions?: No Does the patient have difficulty walking or climbing stairs?: No Does the patient have difficulty dressing or bathing?: No Does the patient have difficulty doing errands alone such as visiting a doctor's office or shopping?: No    Assessment & Plan  1. Type 2 diabetes mellitus with stage 3a chronic kidney disease, without long-term current use of insulin (HCC)  - POCT HgB A1C  2. Controlled gout   3. Chronic heart failure with preserved ejection fraction ([place])   4. Stable angina (HCC)  Doing well at this time   5. Hypertension associated with type 2 diabetes mellitus ([place])   6. OSA on CPAP   7. Aortic atherosclerosis ([place])  On statin therapy   8. Essential hypertension   9. Obesity (BMI 30.0-34.9)  Discussed with the patient the risk posed by an increased BMI. Discussed importance of portion control, calorie counting and at least 150 minutes of physical activity weekly. Avoid sweet beverages and drink more water. Eat at least 6 servings of fruit and vegetables daily    10. Diabetic polyneuropathy associated with type 2 diabetes mellitus ([place])   11. Rash  - triamcinolone cream (KENALOG) 0.1 %; Apply 1 application topically 2 (two) times daily.  Dispense: 30 g; Refill: 0

## 2021-02-17 ENCOUNTER — Ambulatory Visit (INDEPENDENT_AMBULATORY_CARE_PROVIDER_SITE_OTHER): Payer: Medicare Other | Admitting: Family Medicine

## 2021-02-17 ENCOUNTER — Encounter: Payer: Self-pay | Admitting: Family Medicine

## 2021-02-17 VITALS — BP 136/74 | HR 72 | Temp 98.0°F | Resp 16 | Ht 68.0 in | Wt 229.0 lb

## 2021-02-17 DIAGNOSIS — E1159 Type 2 diabetes mellitus with other circulatory complications: Secondary | ICD-10-CM | POA: Diagnosis not present

## 2021-02-17 DIAGNOSIS — R21 Rash and other nonspecific skin eruption: Secondary | ICD-10-CM

## 2021-02-17 DIAGNOSIS — I700 Atherosclerosis of aorta: Secondary | ICD-10-CM | POA: Diagnosis not present

## 2021-02-17 DIAGNOSIS — G4733 Obstructive sleep apnea (adult) (pediatric): Secondary | ICD-10-CM | POA: Diagnosis not present

## 2021-02-17 DIAGNOSIS — N1831 Chronic kidney disease, stage 3a: Secondary | ICD-10-CM

## 2021-02-17 DIAGNOSIS — I152 Hypertension secondary to endocrine disorders: Secondary | ICD-10-CM

## 2021-02-17 DIAGNOSIS — E669 Obesity, unspecified: Secondary | ICD-10-CM

## 2021-02-17 DIAGNOSIS — I208 Other forms of angina pectoris: Secondary | ICD-10-CM

## 2021-02-17 DIAGNOSIS — M109 Gout, unspecified: Secondary | ICD-10-CM | POA: Diagnosis not present

## 2021-02-17 DIAGNOSIS — I10 Essential (primary) hypertension: Secondary | ICD-10-CM

## 2021-02-17 DIAGNOSIS — I5032 Chronic diastolic (congestive) heart failure: Secondary | ICD-10-CM | POA: Diagnosis not present

## 2021-02-17 DIAGNOSIS — Z9989 Dependence on other enabling machines and devices: Secondary | ICD-10-CM

## 2021-02-17 DIAGNOSIS — E1142 Type 2 diabetes mellitus with diabetic polyneuropathy: Secondary | ICD-10-CM

## 2021-02-17 DIAGNOSIS — E1122 Type 2 diabetes mellitus with diabetic chronic kidney disease: Secondary | ICD-10-CM | POA: Diagnosis not present

## 2021-02-17 LAB — POCT GLYCOSYLATED HEMOGLOBIN (HGB A1C): Hemoglobin A1C: 6.5 % — AB (ref 4.0–5.6)

## 2021-02-17 MED ORDER — TRIAMCINOLONE ACETONIDE 0.1 % EX CREA: 1.0000 "application " | TOPICAL_CREAM | Freq: Two times a day (BID) | CUTANEOUS | 0 refills | Status: AC

## 2021-02-21 DIAGNOSIS — E1021 Type 1 diabetes mellitus with diabetic nephropathy: Secondary | ICD-10-CM | POA: Diagnosis not present

## 2021-02-24 ENCOUNTER — Telehealth: Payer: Self-pay

## 2021-02-24 ENCOUNTER — Other Ambulatory Visit: Payer: Self-pay | Admitting: Family Medicine

## 2021-02-24 DIAGNOSIS — E1169 Type 2 diabetes mellitus with other specified complication: Secondary | ICD-10-CM

## 2021-02-24 NOTE — Telephone Encounter (Signed)
Copied from CRM [id number]. Topic: General - Other >> Feb 24, 2021  4:03 PM Gaetana Michaelis A wrote: Reason for CRM: The patient was seen by Dr. Gillermina Phy. Minhas MD and received a bill for $359  Dr. Lynford Humphrey was out of network for the patient and the visit will not be covered, the patient's wife has inquired if there are any services or community resource providers available to help cover the cost of the visit  Please contact further if/when possible

## 2021-02-26 ENCOUNTER — Telehealth: Payer: Self-pay

## 2021-02-26 NOTE — Progress Notes (Signed)
Chronic Care Management Pharmacy Assistant   Name: Drew [name]  MRN: [id number] DOB: 1941/01/11  Reason for Encounter:Diabetes Disease State Call/Patient assistance renewal update for Victoza.   Recent office visits:  02/17/2021 Dr.Sowles MD (PCP) Start Triamicinolone Acetonide 0.1% 2 times daily, A1C Completed: 6.5%  Recent consult visits:  No recent consult visit  Hospital visits:  None in previous 6 months  Medications: Outpatient Encounter Medications as of 02/26/2021  Medication Sig   acetaminophen (TYLENOL) 500 MG tablet Take 500 mg by mouth every 6 (six) hours as needed.   allopurinol (ZYLOPRIM) 100 MG tablet Take 100 mg by mouth 2 (two) times daily.   amLODipine (NORVASC) 5 MG tablet TAKE 1 TABLET BY MOUTH  DAILY   Apoaequorin (PREVAGEN PO) Take by mouth.   Ascorbic Acid (VITAMIN C) 1000 MG tablet Take 1,000 mg by mouth daily.   aspirin EC 81 MG tablet Take 81 mg by mouth daily.   carvedilol (COREG) 3.125 MG tablet TAKE 1 TABLET BY MOUTH  TWICE DAILY   Cholecalciferol (VITAMIN D3) 25 MCG (1000 UT) CAPS Take 1,000 Units by mouth daily.   Coenzyme Q10 (CO Q 10) 100 MG CAPS Take 100 mg by mouth daily.   famotidine (PEPCID) 20 MG tablet TAKE 1 TABLET BY MOUTH  DAILY   levothyroxine (SYNTHROID) 25 MCG tablet TAKE 1 TABLET BY MOUTH IN  THE MORNING   losartan (COZAAR) 25 MG tablet TAKE 1 TABLET BY MOUTH  DAILY   Multiple Vitamins-Minerals (CENTRUM SILVER PO) Take 200 mg by mouth daily.   Needle, Disp, 32G X 5/16" MISC 6 mm by Does not apply route 2 (two) times daily.   Omega-3 Fatty Acids (FISH OIL) 1000 MG CAPS Take 1,000 mg by mouth daily.   rosuvastatin (CRESTOR) 20 MG tablet TAKE 1 TABLET BY MOUTH  DAILY   triamcinolone cream (KENALOG) 0.1 % Apply 1 application topically 2 (two) times daily.   Turmeric 500 MG CAPS Take 500 mg by mouth daily.   VICTOZA 18 MG/3ML SOPN INJECT SUBCUTANEOUSLY 1.2  MG DAILY   No facility-administered encounter medications on file as of  02/26/2021.    Care Gaps: Shingrix Vaccine COVID-19 Vaccnie Ophthalmology Exam   Star Rating Drugs: Rosuvastatin 20 mg  last filled 01/21/2021 for 90 day supply (No pharmacy noted) Losartan 25 mg last filled 02/20/2021 for 90 day supply (No pharmacy noted) Victoza 18 MG  last filled 02/25/2021 for 90 day supply (No pharmacy noted - receive through [employer])   Medication Fill Gaps: None ID  Recent Relevant Labs: Lab Results  Component Value Date/Time   HGBA1C 6.5 (A) 02/17/2021 10:00 AM   HGBA1C 6.4 (A) 10/15/2020 11:03 AM   HGBA1C 6.2 (H) 05/04/2019 10:02 AM   HGBA1C 5.9 (H) 08/02/2018 11:14 AM   MICROALBUR 2.2 06/16/2020 10:13 AM   MICROALBUR 2.1 08/02/2018 11:14 AM    Kidney Function Lab Results  Component Value Date/Time   CREATININE 1.19 11/20/2020 12:12 PM   CREATININE 1.24 (H) 06/16/2020 10:13 AM   CREATININE 1.32 (H) 01/08/2020 01:56 PM   CREATININE 1.22 (H) 05/04/2019 10:02 AM   GFRNONAA 55 (L) 06/16/2020 10:13 AM   GFRAA 63 06/16/2020 10:13 AM    Current antihyperglycemic regimen:  Victoza 1.2 mg daily  What recent interventions/DTPs have been made to improve glycemic control:  Patient states there are some days where he and his wife will leave in a hurry to eat at a restaurant and forget to bring his victoza.Patient states  this happens sometimes but not all the time. Have there been any recent hospitalizations or ED visits since last visit with CPP? No Patient denies hypoglycemic symptoms, including Pale, Sweaty, Shaky, Hungry, Nervous/irritable, and Vision changes Patient reports he was experiencing symptoms of being cold and freezing a couple of weeks ago, but now it is hot and sweaty.Patient states he is unsure if this is related to hot flashes or something else.  Patient denies hyperglycemic symptoms, including blurry vision, excessive thirst, fatigue, polyuria, and weakness How often are you checking your blood sugar? once daily What are your blood  sugars ranging?  Fasting:  On 02/27/2021 it was 153. Patient is unsure of the dates: 130,135,140,175,150,1250. Before meals: None After meals: None Bedtime: None  During the week, how often does your blood glucose drop below 70? Never Are you checking your feet daily/regularly?  Yes,Patient denies pain,numbness, or tingling sensation in his feet.  Patient reports he did receive a new blood sugar meter from [employer].  Patient states he is walking more because of the weather has been tolerable.  Patient reports he bought a book name "Diabetes for dummies", and he is not sure if this is a good resource. Patient express confusion on what his diet should be.Patient states he was unaware baked potatoes, and watermelon was bad for people who has Diabetes.  I ask the patient if he would like for me to mail him some education and resource related to Diabetes to help.Patient states he would be very grateful because he is unsure what his diet should be like,what to avoid, and what to limit.   Patient assistance renewal update for Victoza:  I reach out to [employer] to get the status on patient renewal application for Victoza.  Per [employer], page four needs to be resubmitted because they are unable to read the providers signature or the date she sign. Notified Clinical pharmacist.  Patient verbalized understanding.I informed patient I will continue reaching out to get updates, and let him know.    Adherence Review: Is the patient currently on a STATIN medication? Yes Is the patient currently on ACE/ARB medication? Yes Does the patient have >5 day gap between last estimated fill dates? No  Telephone follow up appointment with Care management team member scheduled for : 03/25/2021 at 2:00 pm.  Everlean Cherry Clinical Pharmacist Assistant [phone]

## 2021-02-26 NOTE — Telephone Encounter (Signed)
Patient's bill was not generated with our office.  Patient's family will call the office of Dr. Lynford Humphrey to find out more about the bill in question.

## 2021-03-24 ENCOUNTER — Telehealth: Payer: Self-pay

## 2021-03-24 NOTE — Progress Notes (Signed)
Chronic care management Drew [name], No answer, left message of appointment on 03/25/2021 at 2:00 pm via telephone visit with [name] , Pharm D. Notified to have all medications, supplements, blood pressure and/or blood sugar logs available during appointment and to return call if need to reschedule.  I reach out to [name] to get a update on patient assistance renewal application for  Victoza.  Per [name], patient was approved to received patient assistance for Victoza for year 2023.Patient will need to re enroll after 12/06/2021 and before 02/21/2022 for year 2024.Patient ID is [id number].Patient shipment should be received from 30 days from today 03/24/2021.  [facility] Pharmacist Assistant [phone]

## 2021-03-25 ENCOUNTER — Ambulatory Visit (INDEPENDENT_AMBULATORY_CARE_PROVIDER_SITE_OTHER): Payer: Medicare Other

## 2021-03-25 DIAGNOSIS — E1169 Type 2 diabetes mellitus with other specified complication: Secondary | ICD-10-CM

## 2021-03-25 DIAGNOSIS — E669 Obesity, unspecified: Secondary | ICD-10-CM

## 2021-03-25 DIAGNOSIS — E785 Hyperlipidemia, unspecified: Secondary | ICD-10-CM

## 2021-03-25 NOTE — Progress Notes (Signed)
Chronic Care Management Pharmacy Note  04/21/2021 Name:  MACYN REMMERT MRN:  [id number] DOB:  1940-05-31  Summary: Patient presents for CCM follow-up  Recommendations/Changes made from today's visit: Continue current medications   Plan: CPP follow-up 6 months   Summary: Patient presents for CCM follow-up. He is doing well with his blood pressure recently, although his home readings continue to be borderline low.  Recommendations/Changes made from today's visit: Continue current medications  Plan: CPP follow-up in 6 months   Subjective: JONATAN WILSEY is an 81 y.o. year old male who is a primary patient of Steele Sizer, MD.  The CCM team was consulted for assistance with disease management and care coordination needs.    Engaged with patient by telephone for follow up visit in response to provider referral for pharmacy case management and/or care coordination services.   Consent to Services:  The patient was given information about Chronic Care Management services, agreed to services, and gave verbal consent prior to initiation of services.  Please see initial visit note for detailed documentation.   Patient Care Team: Steele Sizer, MD as PCP - General (Family Medicine) Murlean Iba, MD (Nephrology) Germaine Pomfret, [facility] as Pharmacist (Pharmacist) Flora Lipps, MD as Consulting Physician (Pulmonary Disease) End, Harrell Gave, MD as Consulting Physician (Cardiology)  Recent office visits: 02/17/21: Patient presened to Dr. Ancil Boozer for follow-up. A1c 6.5%.  04/15/20: Patient presented to Clemetine Marker, LPN for AWV.   Recent consult visits: 01/26/21: Patient presented to Dr. Candiss Norse (Nephrology) for follow-up.  11/20/20: Patient presented to Cadence Kathlen Mody, PA-C (Cardiology) for chest pain.   Hospital visits: None in previous 6 months   Objective:  Lab Results  Component Value Date   CREATININE 1.19 11/20/2020   BUN 16 11/20/2020   GFRNONAA 55 (L) 06/16/2020    GFRAA 63 06/16/2020   NA 140 11/20/2020   K 4.2 11/20/2020   CALCIUM 9.8 11/20/2020   CO2 17 (L) 11/20/2020   GLUCOSE 92 11/20/2020    Lab Results  Component Value Date/Time   HGBA1C 6.5 (A) 02/17/2021 10:00 AM   HGBA1C 6.4 (A) 10/15/2020 11:03 AM   HGBA1C 6.2 (H) 05/04/2019 10:02 AM   HGBA1C 5.9 (H) 08/02/2018 11:14 AM   MICROALBUR 2.2 06/16/2020 10:13 AM   MICROALBUR 2.1 08/02/2018 11:14 AM    Last diabetic Eye exam:  Lab Results  Component Value Date/Time   HMDIABEYEEXA No Retinopathy 06/25/2019 12:00 AM    Last diabetic Foot exam: No results found for: HMDIABFOOTEX   Lab Results  Component Value Date   CHOL 119 05/19/2020   HDL 34 (L) 05/19/2020   LDLCALC 44 05/19/2020   TRIG 205 (H) 05/19/2020   CHOLHDL 3.5 05/19/2020    Hepatic Function Latest Ref Rng & Units 06/16/2020 05/19/2020 02/19/2020  Total Protein 6.1 - 8.1 g/dL 6.5 - -  Albumin 3.6 - 5.1 g/dL - - -  AST 10 - 35 U/L 26 - -  ALT 9 - 46 U/L 33 38 44  Alk Phosphatase 40 - 115 U/L - - -  Total Bilirubin 0.2 - 1.2 mg/dL 1.0 - -    Lab Results  Component Value Date/Time   TSH 2.520 11/20/2020 12:12 PM   TSH 2.32 06/16/2020 10:13 AM    CBC Latest Ref Rng & Units 11/20/2020 06/16/2020 05/04/2019  WBC 3.4 - 10.8 x10E3/uL 6.7 5.6 5.6  Hemoglobin 13.0 - 17.7 g/dL 14.3 14.9 15.3  Hematocrit 37.5 - 51.0 % 40.8 43.4 45.5  Platelets 150 -  450 x10E3/uL 161 118(L) 137(L)    No results found for: VD25OH  Clinical ASCVD: Yes  The ASCVD Risk score (Arnett DK, et al., 2019) failed to calculate for the following reasons:   The 2019 ASCVD risk score is only valid for ages 12 to 14    Depression screen PHQ 2/9 04/16/2021 02/17/2021 10/15/2020  Decreased Interest 0 0 0  Down, Depressed, Hopeless 0 0 0  PHQ - 2 Score 0 0 0  Altered sleeping 0 0 -  Tired, decreased energy 0 0 -  Change in appetite 0 0 -  Feeling bad or failure about yourself  0 0 -  Trouble concentrating 0 0 -  Moving slowly or fidgety/restless 0 0  -  Suicidal thoughts 0 0 -  PHQ-9 Score 0 0 -  Difficult doing work/chores - - -  Some recent data might be hidden     Social History   Tobacco Use  Smoking Status Former   Years: 1.00   Types: Cigarettes, Cigars   Quit date: 1990   Years since quitting: 33.1  Smokeless Tobacco Never  Tobacco Comments   Smoked afternoon cigar   BP Readings from Last 3 Encounters:  02/17/21 136/74  11/20/20 120/80  10/15/20 122/84   Pulse Readings from Last 3 Encounters:  02/17/21 72  11/20/20 (!) 58  10/15/20 86   Wt Readings from Last 3 Encounters:  02/17/21 229 lb (103.9 kg)  11/20/20 225 lb 6 oz (102.2 kg)  10/15/20 225 lb (102.1 kg)   BMI Readings from Last 3 Encounters:  02/17/21 34.82 kg/m  11/20/20 34.27 kg/m  10/15/20 34.21 kg/m    Assessment/Interventions: Review of patient past medical history, allergies, medications, health status, including review of consultants reports, laboratory and other test data, was performed as part of comprehensive evaluation and provision of chronic care management services.   SDOH:  (Social Determinants of Health) assessments and interventions performed: Yes SDOH Interventions    Flowsheet Row Most Recent Value  SDOH Interventions   Financial Strain Interventions Other (Comment)  [PAP]        SDOH Screenings   Alcohol Screen: Low Risk    Last Alcohol Screening Score (AUDIT): 0  Depression (PHQ2-9): Low Risk    PHQ-2 Score: 0  Financial Resource Strain: High Risk   Difficulty of Paying Living Expenses: Hard  Food Insecurity: No Food Insecurity   Worried About Charity fundraiser in the Last Year: Never true   Ran Out of Food in the Last Year: Never true  Housing: Low Risk    Last Housing Risk Score: 0  Physical Activity: Insufficiently Active   Days of Exercise per Week: 5 days   Minutes of Exercise per Session: 20 min  Social Connections: Moderately Integrated   Frequency of Communication with Friends and Family: More  than three times a week   Frequency of Social Gatherings with Friends and Family: Twice a week   Attends Religious Services: More than 4 times per year   Active Member of [employer] or Organizations: No   Attends Archivist Meetings: Never   Marital Status: Married  Stress: No Stress Concern Present   Feeling of Stress : Only a little  Tobacco Use: Medium Risk   Smoking Tobacco Use: Former   Smokeless Tobacco Use: Never   Passive Exposure: Not on file  Transportation Needs: No Transportation Needs   Lack of Transportation (Medical): No   Lack of Transportation (Non-Medical): No    CCM  Care Plan  Allergies  Allergen Reactions   Niacin And Related Itching and Rash    Medications Reviewed Today     Reviewed by Clemetine Marker, LPN (Licensed Practical Nurse) on 04/16/21 at 17  Med List Status: <None>   Medication Order Taking? Sig Documenting Provider Last Dose Status Informant  acetaminophen (TYLENOL) 500 MG tablet [id number] Yes Take 500 mg by mouth every 6 (six) hours as needed. [provider] Taking Active Self  allopurinol (ZYLOPRIM) 100 MG tablet [id number] Yes Take 100 mg by mouth 2 (two) times daily. [provider] Taking Active Self           Med Note (NEWCOMER [place], BRANDY L   Wed Aug 13, 2020 10:19 AM)    amLODipine (NORVASC) 5 MG tablet [id number] Yes TAKE 1 TABLET BY MOUTH  DAILY End, Harrell Gave, MD Taking Active   aspirin EC 81 MG tablet [id number] Yes Take 81 mg by mouth daily. [provider] Taking Active Self  carvedilol (COREG) 3.125 MG tablet [id number] Yes TAKE 1 TABLET BY MOUTH  TWICE DAILY End, Christopher, MD Taking Active   Cholecalciferol (VITAMIN D3) 25 MCG (1000 UT) CAPS [id number] Yes Take 1,000 Units by mouth daily. [provider] Taking Active Self  Coenzyme Q10 (CO Q 10) 100 MG CAPS [id number] Yes Take 100 mg by mouth daily. [provider] Taking Active Self  famotidine (PEPCID) 20 MG tablet [id number]  Yes TAKE 1 TABLET BY MOUTH  DAILY Sowles, Drue Stager, MD Taking Active   levothyroxine (SYNTHROID) 25 MCG tablet [id number] Yes TAKE 1 TABLET BY MOUTH IN  THE Dyanne Iha, Drue Stager, MD Taking Active   losartan (COZAAR) 25 MG tablet [id number] Yes TAKE 1 TABLET BY MOUTH  DAILY End, Christopher, MD Taking Active   Multiple Vitamins-Minerals (CENTRUM SILVER PO) [id number] Yes Take 200 mg by mouth daily. [provider] Taking Active Self  Needle, Disp, 32G X 5/16" [place] [id number] Yes 6 mm by Does not apply route 2 (two) times daily. Steele Sizer, MD Taking Active   Omega-3 Fatty Acids (FISH OIL) 1000 MG CAPS [id number] Yes Take 1,000 mg by mouth daily. [provider] Taking Active Self  rosuvastatin (CRESTOR) 20 MG tablet [id number] Yes TAKE 1 TABLET BY MOUTH  DAILY Sowles, Drue Stager, MD Taking Active   triamcinolone cream (KENALOG) 0.1 % [id number] Yes Apply 1 application topically 2 (two) times daily. Steele Sizer, MD Taking Active   Turmeric 500 MG CAPS [id number] Yes Take 500 mg by mouth daily. [provider] Taking Active Self  VICTOZA 18 MG/3ML SOPN [id number] Yes INJECT SUBCUTANEOUSLY 1.2  MG DAILY Teodora Medici, DO Taking Active             Patient Active Problem List   Diagnosis Date Noted   Coronary artery disease of native artery of native heart with stable angina pectoris ([place]) 08/14/2020   Aortic atherosclerosis ([place]) 08/14/2020   Atrophy of kidney 10/10/2019   Edema of lower extremity 10/10/2019   Thrombocytopenia ([place]) 08/13/2019   Diabetes mellitus type 2 in obese ([place]) 08/13/2019   Hyperlipidemia associated with type 2 diabetes mellitus ([place]) 08/13/2019   Neuropathy involving both lower extremities 05/04/2019   Polyneuropathy, unspecified 05/04/2019   Multiple renal cysts 04/09/2019   Stable angina ([place]) 12/29/2018   Onychomycosis of multiple toenails with type 2 diabetes mellitus ([place]) 11/30/2017   Chronic heart failure with preserved ejection  fraction (HFpEF) ([place]) 10/20/2017   Obstructive sleep apnea 02/10/2017   Abnormal ankle brachial index (ABI) 11/26/2015  Stage 3a chronic kidney disease ([place]) 08/15/2015   H/O: upper GI bleed    Hypothyroidism due to acquired atrophy of thyroid 03/11/2015   History of prostatitis 11/06/2014   Essential hypertension 11/06/2014   Mixed hyperlipidemia 11/06/2014   Flat feet, bilateral 11/06/2014   Chronic multiple gastric erosions 11/06/2014   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 ([place]) 11/06/2014    Immunization History  Administered Date(s) Administered   Fluad Quad(high Dose 65+) 11/13/2019, 12/18/2020   Influenza, High Dose Seasonal PF 11/06/2014, 11/24/2015, 11/30/2016, 11/30/2017   Influenza-Unspecified 12/29/2018   PFIZER(Purple Top)SARS-COV-2 Vaccination 04/05/2019, 04/30/2019, 12/24/2019   Pneumococcal Conjugate-13 12/14/2013   Pneumococcal Polysaccharide-23 10/02/2009   Tdap 06/16/2020   Zoster Recombinat (Shingrix) 11/27/2020, 04/02/2021    Conditions to be addressed/monitored:  Hypertension, Hyperlipidemia, Diabetes, Heart Failure, Coronary Artery Disease, GERD, Chronic Kidney Disease and Gout  Care Plan : General Pharmacy (Adult)  Updates made by Germaine Pomfret, RPH since 04/21/2021 12:00 AM     Problem: Hypertension, Hyperlipidemia, Diabetes, Heart Failure, Coronary Artery Disease, GERD, Chronic Kidney Disease and Gout   Priority: High     Long-Range Goal: Patient-Specific Goal   Start Date: 07/16/2020  Expected End Date: 01/16/2021  This Visit's Progress: On track  Recent Progress: On track  Priority: High  Note:   Current Barriers:  Unable to independently afford treatment regimen  Pharmacist Clinical Goal(s):  Patient will verbalize ability to afford treatment regimen maintain control of diabetes as evidenced by A1c less than 8%  maintain control of heart failure as evidenced by stable weight, lack of hospitalizations  through collaboration with PharmD and provider.   Interventions: 1:1 collaboration with Steele Sizer, MD regarding development and update of comprehensive plan of care as evidenced by provider attestation and co-signature Inter-disciplinary care team collaboration (see longitudinal plan of care) Comprehensive medication review performed; medication list updated in electronic medical record  Diabetes (A1c goal <8%) -Controlled -Current medications: Victoza 1.2 mg daily  -Medications previously tried: Metformin XR   -Current home glucose readings fasting glucose: 189, 205 (cornbread), 162, 127, 167, 128, 194,   -Denies hypoglycemic/hyperglycemic symptoms -Current meal patterns: does not follow regular dietary regimen -Current exercise: Walking through garden, with dog.  -Recommended to continue current medication  Heart Failure (Goal: manage symptoms and prevent exacerbations) -Controlled  -Managed by Dr. Saunders Revel -Last ejection fraction: WNL (Date: Jan 2018) -HF type: Diastolic -NYHA Class: I (no actitivty limitation) -AHA HF Stage: B (Heart disease present - no symptoms present) -Current treatment: Amlodipine 5 mg daily  Carvedilol 3.125 mg twice daily  Losartan 25 mg daily  -Medications previously tried: NA  -Current home BP/HR readings:   -Recommended to continue current medication.   Hyperlipidemia: (LDL goal < 70) -Controlled -Current treatment: Rosuvastatin 20 mg daily  -Current antiplatelet treatment: Aspirin 81 mg daily  -Medications previously tried: NA -Educated on Importance of limiting foods high in cholesterol; -Recommended to continue current medication  Hypothyroidism (Goal: Maintain stable thyroid function) -Controlled -Current treatment  Levothyroxine 25 mcg daily  -Medications previously tried: NA  -Recommended to continue current medication  Gout (Goal: Prevent gout flares) -Controlled  -Managed by Dr. Murlean Iba -Current treatment   Allopurinol 100 mg twice daily  -Medications previously tried: NA  -Counseled patient on low purine diet plan. Counseled patient to reduce consumption of high-fructose corn syrup, sweetened soft drinks, fruit juices, meat, and seafood. -Recommended to continue current medication  GERD (Goal: Prevent heartburn/reflux) -Controlled -Current treatment  Famotidine 20 mg daily  -  Medications previously tried: NA  -Recommended to continue current medication  Patient Goals/Self-Care Activities Patient will:  - check glucose daily, document, and provide at future appointments check blood pressure twice weekly, document, and provide at future appointments weigh daily, and contact provider if weight gain of greater than 2 pounds  Follow Up Plan: Telephone follow up appointment with care management team member scheduled for:  09/23/2021 at 3:45 PM        Medication Assistance:  Victoza obtained through [name] medication assistance program.  Enrollment ends Dec 2023  Compliance/Adherence/Medication fill history: Care Gaps: Ophthalmology Exam  Star-Rating Drugs: No fill history available  Patient's preferred pharmacy is:  [facility] ([place], [place] [facility] [address] [place] [address] [place] [address] Phone: [phone] Fax: [facility] [address], [place] [address] [place] [address] Phone: [phone] Fax: [phone]  [facility] - [place], [place] [address] Phone: [phone] Fax: [phone]   Uses pill box? Yes Pt endorses 100% compliance  We discussed: Current pharmacy is preferred with insurance plan and patient is satisfied with pharmacy services Patient decided to: Continue current medication management strategy  Care Plan and Follow Up Patient Decision:   Patient agrees to Care Plan and Follow-up.  Plan: Telephone follow up appointment with care management team member scheduled for:  09/23/2021 at 3:45 PM  Doristine Section, [facility] [phone]

## 2021-04-16 ENCOUNTER — Ambulatory Visit (INDEPENDENT_AMBULATORY_CARE_PROVIDER_SITE_OTHER): Payer: Medicare Other

## 2021-04-16 DIAGNOSIS — Z0000 Encounter for general adult medical examination without abnormal findings: Secondary | ICD-10-CM | POA: Diagnosis not present

## 2021-04-16 NOTE — Progress Notes (Signed)
Subjective:   Drew [name] is a 81 y.o. male who presents for Medicare Annual/Subsequent preventive examination.  Virtual Visit via Telephone Note  I connected with  Drew [name] on 04/16/21 at  2:10 PM EST by telephone and verified that I am speaking with the correct person using two identifiers.  Location: Patient: home Provider: [facility] Persons participating in the virtual visit: patient/Nurse Health Advisor   I discussed the limitations, risks, security and privacy concerns of performing an evaluation and management service by telephone and the availability of in person appointments. The patient expressed understanding and agreed to proceed.  Interactive audio and video telecommunications were attempted between this nurse and patient, however failed, due to patient having technical difficulties OR patient did not have access to video capability.  We continued and completed visit with audio only.  Some vital signs may be absent or patient reported.   Reather Littler, LPN   Review of Systems           Objective:    There were no vitals filed for this visit. There is no height or weight on file to calculate BMI.  Advanced Directives 04/16/2021 04/15/2020 03/01/2019 01/27/2018 11/30/2016 08/27/2016 02/09/2016  Does Patient Have a Medical Advance Directive? No No Yes No No No Yes  Type of Advance Directive - Web designer;Living will - - - -  Copy of Healthcare Power of Attorney in Chart? - - No - copy requested - - - -  Would patient like information on creating a medical advance directive? Yes (MAU/Ambulatory/Procedural Areas - Information given) Yes (MAU/Ambulatory/Procedural Areas - Information given) - Yes (MAU/Ambulatory/Procedural Areas - Information given) - - -    Current Medications (verified) Outpatient Encounter Medications as of 04/16/2021  Medication Sig   acetaminophen (TYLENOL) 500 MG tablet Take 500 mg by mouth every 6 (six) hours as needed.    allopurinol (ZYLOPRIM) 100 MG tablet Take 100 mg by mouth 2 (two) times daily.   amLODipine (NORVASC) 5 MG tablet TAKE 1 TABLET BY MOUTH  DAILY   aspirin EC 81 MG tablet Take 81 mg by mouth daily.   carvedilol (COREG) 3.125 MG tablet TAKE 1 TABLET BY MOUTH  TWICE DAILY   Cholecalciferol (VITAMIN D3) 25 MCG (1000 UT) CAPS Take 1,000 Units by mouth daily.   Coenzyme Q10 (CO Q 10) 100 MG CAPS Take 100 mg by mouth daily.   famotidine (PEPCID) 20 MG tablet TAKE 1 TABLET BY MOUTH  DAILY   levothyroxine (SYNTHROID) 25 MCG tablet TAKE 1 TABLET BY MOUTH IN  THE MORNING   losartan (COZAAR) 25 MG tablet TAKE 1 TABLET BY MOUTH  DAILY   Multiple Vitamins-Minerals (CENTRUM SILVER PO) Take 200 mg by mouth daily.   Needle, Disp, 32G X 5/16" MISC 6 mm by Does not apply route 2 (two) times daily.   Omega-3 Fatty Acids (FISH OIL) 1000 MG CAPS Take 1,000 mg by mouth daily.   rosuvastatin (CRESTOR) 20 MG tablet TAKE 1 TABLET BY MOUTH  DAILY   triamcinolone cream (KENALOG) 0.1 % Apply 1 application topically 2 (two) times daily.   Turmeric 500 MG CAPS Take 500 mg by mouth daily.   VICTOZA 18 MG/3ML SOPN INJECT SUBCUTANEOUSLY 1.2  MG DAILY   [DISCONTINUED] Apoaequorin (PREVAGEN PO) Take by mouth.   [DISCONTINUED] Ascorbic Acid (VITAMIN C) 1000 MG tablet Take 1,000 mg by mouth daily.   No facility-administered encounter medications on file as of 04/16/2021.    Allergies (verified)  Niacin and related   History: Past Medical History:  Diagnosis Date   (HFpEF) heart failure with preserved ejection fraction (HCC)    a. 02/2016 Echo: EF nl. Gr1 DD. Mild AI. Mod thickened AoV w/o stenosis. Nl RV size and fxn.   Aortic atherosclerosis (HCC)    CAD (coronary artery disease)    a. 12/2019 Cor CTA: LM nl, LAD 25-49p/m, LCX small, nondom, nl, RCA large, dom, 50-69p (nl FFR), 25-42m, Ca2+ = 1602 (79th%'ile)-->Med rx.   Chronic kidney disease, stage III (moderate) (HCC) 08/15/2015   Diabetes mellitus with complication  (HCC)    GERD (gastroesophageal reflux disease)    H/O: upper GI bleed 2015   Hiatal hernia    a. 12/2019 Moderate-sized HH incidentally noted on Cor CTA.   History of stress test    a. 05/2016 Lexiscan MV: EF 72%, No ischemia-->Low risk.   Hypercholesteremia    Hypertension    Obesity (BMI 30-39.9) 08/07/2015   Peripheral vascular disease (HCC) 12/26/2015   Past Surgical History:  Procedure Laterality Date   CATARACT EXTRACTION, BILATERAL Bilateral 11/18/2017   COLONOSCOPY N/A 12/27/2013   Procedure: COLONOSCOPY;  Surgeon: Barrie Folk, MD;  Location: [facility] ENDOSCOPY;  Service: Endoscopy;  Laterality: N/A;   ENTEROSCOPY N/A 12/21/2013   Procedure: ENTEROSCOPY;  Surgeon: Vertell Novak., MD;  Location: [facility] ENDOSCOPY;  Service: Endoscopy;  Laterality: N/A;   ENTEROSCOPY N/A 12/27/2013   Procedure: ENTEROSCOPY;  Surgeon: Barrie Folk, MD;  Location[facility] ENDOSCOPY;  Service: Endoscopy;  Laterality: N/A;   Family History  Problem Relation Age of Onset   Hyperlipidemia Father    Kidney disease Father    Heart disease Father    Congestive Heart Failure Mother    Heart disease Mother    Hyperlipidemia Daughter    Hyperlipidemia Son    Diabetes Son    Anxiety disorder Daughter    Depression Daughter    Arthritis Daughter    Social History   Socioeconomic History   Marital status: Married    Spouse name: Not on file   Number of children: 4   Years of education: Not on file   Highest education level: High school graduate  Occupational History   Occupation: retired  Tobacco Use   Smoking status: Former    Years: 1.00    Types: Cigarettes, Cigars    Quit date: 1990    Years since quitting: 33.1   Smokeless tobacco: Never   Tobacco comments:    Smoked afternoon cigar  Vaping Use   Vaping Use: Never used  Substance and Sexual Activity   Alcohol use: No   Drug use: No   Sexual activity: Yes  Other Topics Concern   Not on file  Social History Narrative   Not on file    Social Determinants of Health   Financial Resource Strain: Low Risk    Difficulty of Paying Living Expenses: Not very hard  Food Insecurity: No Food Insecurity   Worried About Programme researcher, broadcasting/film/video in the Last Year: Never true   Ran Out of Food in the Last Year: Never true  Transportation Needs: No Transportation Needs   Lack of Transportation (Medical): No   Lack of Transportation (Non-Medical): No  Physical Activity: Insufficiently Active   Days of Exercise per Week: 5 days   Minutes of Exercise per Session: 20 min  Stress: No Stress Concern Present   Feeling of Stress : Only a little  Social Connections: Moderately Integrated  Frequency of Communication with Friends and Family: More than three times a week   Frequency of Social Gatherings with Friends and Family: Twice a week   Attends Religious Services: More than 4 times per year   Active Member of [employer] or Organizations: No   Attends Music therapist: Never   Marital Status: Married    Tobacco Counseling Counseling given: Not Answered Tobacco comments: Smoked afternoon cigar   Clinical Intake:  Pre-visit preparation completed: Yes  Pain : No/denies pain     Nutritional Risks: None Diabetes: Yes CBG done?: No Did pt. bring in CBG monitor from home?: No  How often do you need to have someone help you when you read instructions, pamphlets, or other written materials from your doctor or pharmacy?: 1 - Never  Nutrition Risk Assessment:  Has the patient had any N/V/D within the last 2 months?  No  Does the patient have any non-healing wounds?  No  Has the patient had any unintentional weight loss or weight gain?  No   Diabetes:  Is the patient diabetic?  Yes  If diabetic, was a CBG obtained today?  No  Did the patient bring in their glucometer from home?  No  How often do you monitor your CBG's? daily.   Financial Strains and Diabetes Management:  Are you having any financial strains with the  device, your supplies or your medication? No .  Does the patient want to be seen by Chronic Care Management for management of their diabetes?  No  Would the patient like to be referred to a Nutritionist or for Diabetic Management?  No   Diabetic Exams:  Diabetic Eye Exam: Completed per patient 04/07/21 with Dr. Lorenda Peck in [place]; need records.   Diabetic Foot Exam: Completed 06/16/20.   Interpreter Needed?: No  Information entered by :: Clemetine Marker LPN   Activities of Daily Living In your present state of health, do you have any difficulty performing the following activities: 04/16/2021 02/17/2021  Hearing? N N  Vision? N N  Difficulty concentrating or making decisions? N N  Walking or climbing stairs? N N  Dressing or bathing? N N  Doing errands, shopping? N N  Some recent data might be hidden    Patient Care Team: Steele Sizer, MD as PCP - General (Family Medicine) Murlean Iba, MD (Nephrology) Germaine Pomfret, [facility] as Pharmacist (Pharmacist) Flora Lipps, MD as Consulting Physician (Pulmonary Disease) End, Harrell Gave, MD as Consulting Physician (Cardiology)  Indicate any recent Medical Services you may have received from other than [facility] providers in the past year (date may be approximate).     Assessment:   This is a routine wellness examination for Tia.  Hearing/Vision screen Hearing Screening - Comments::  Pt denies hearing difficulty Vision Screening - Comments:: Annual vision screenings done at [facility] in [place] and exercise activities discussed:     Goals Addressed             This Visit's Progress    Monitor and Manage My Blood Sugar-Diabetes Type 2   On track    Timeframe:  Long-Range Goal Priority:  High Start Date: 07/16/2020                             Expected End Date:  01/16/2022                      Follow  Up Date 11/21/2020   -check blood sugar daily - check blood sugar if I feel it is too high or  too low - enter blood sugar readings and medication or insulin into daily log - take the blood sugar log to all doctor visits    Why is this important?   Checking your blood sugar at home helps to keep it from getting very high or very low.  Writing the results in a diary or log helps the doctor know how to care for you.  Your blood sugar log should have the time, date and the results.  Also, write down the amount of insulin or other medicine that you take.  Other information, like what you ate, exercise done and how you were feeling, will also be helpful.     Notes:        Depression Screen PHQ 2/9 Scores 04/16/2021 02/17/2021 10/15/2020 06/16/2020 04/15/2020 02/12/2020 11/13/2019  PHQ - 2 Score 0 0 0 0 0 0 0  PHQ- 9 Score 0 0 - - - - -    Fall Risk Fall Risk  04/16/2021 02/17/2021 10/15/2020 06/16/2020 04/15/2020  Falls in the past year? 1 1 1 1  0  Number falls in past yr: 1 1 1 1 1   Comment - - - - -  Injury with Fall? 0 0 0 0 0  Comment - - - - -  Risk for fall due to : No Fall Risks No Fall Risks - - History of fall(s)  Follow up Falls prevention discussed Falls prevention discussed Falls prevention discussed - Falls prevention discussed    FALL RISK PREVENTION PERTAINING TO THE HOME:  Any stairs in or around the home? Yes  If so, are there any without handrails? No  Home free of loose throw rugs in walkways, pet beds, electrical cords, etc? Yes  Adequate lighting in your home to reduce risk of falls? Yes   ASSISTIVE DEVICES UTILIZED TO PREVENT FALLS:  Life alert? No  Use of a cane, walker or w/c? No  Grab bars in the bathroom? Yes  Shower chair or bench in shower? No  Elevated toilet seat or a handicapped toilet? No   TIMED UP AND GO:  Was the test performed? No . Telephonic visit.   Cognitive Function: Normal cognitive status assessed by direct observation by this Nurse Health Advisor. No abnormalities found.       6CIT Screen 04/15/2020 03/01/2019 01/27/2018  What  Year? 0 points 0 points 0 points  What month? 0 points 0 points 0 points  What time? 0 points 0 points 0 points  Count back from 20 0 points 0 points 0 points  Months in reverse 2 points 0 points 0 points  Repeat phrase 4 points 2 points 2 points  Total Score 6 2 2     Immunizations Immunization History  Administered Date(s) Administered   Fluad Quad(high Dose 65+) 11/13/2019, 12/18/2020   Influenza, High Dose Seasonal PF 11/06/2014, 11/24/2015, 11/30/2016, 11/30/2017   Influenza-Unspecified 12/29/2018   PFIZER(Purple Top)SARS-COV-2 Vaccination 04/05/2019, 04/30/2019, 12/24/2019   Pneumococcal Conjugate-13 12/14/2013   Pneumococcal Polysaccharide-23 10/02/2009   Tdap 06/16/2020   Zoster Recombinat (Shingrix) 11/27/2020, 04/02/2021    TDAP status: Up to date  Flu Vaccine status: Up to date  Pneumococcal vaccine status: Up to date  Covid-19 vaccine status: Completed vaccines  Qualifies for Shingles Vaccine? Yes   Zostavax completed No   Shingrix Completed?: Yes  Screening Tests Health Maintenance  Topic Date Due  COVID-19 Vaccine (4 - Booster for Pfizer series) 02/18/2020   OPHTHALMOLOGY EXAM  06/24/2020   FOOT EXAM  06/16/2021   HEMOGLOBIN A1C  08/18/2021   TETANUS/TDAP  06/17/2030   Pneumonia Vaccine 76+ Years old  Completed   INFLUENZA VACCINE  Completed   Zoster Vaccines- Shingrix  Completed   HPV VACCINES  Aged Out    Health Maintenance  Health Maintenance Due  Topic Date Due   COVID-19 Vaccine (4 - Booster for Pfizer series) 02/18/2020   OPHTHALMOLOGY EXAM  06/24/2020    Colorectal cancer screening: No longer required.   Lung Cancer Screening: (Low Dose CT Chest recommended if Age 32-80 years, 30 pack-year currently smoking OR have quit w/in 15years.) does not qualify.   Additional Screening:  Hepatitis C Screening: does not qualify  Vision Screening: Recommended annual ophthalmology exams for early detection of glaucoma and other disorders of the  eye. Is the patient up to date with their annual eye exam?  Yes  Who is the provider or what is the name of the office in which the patient attends annual eye exams? Dr. Lorenda Peck in [place].   Dental Screening: Recommended annual dental exams for proper oral hygiene  Community Resource Referral / Chronic Care Management: CRR required this visit?  No   CCM required this visit?  No      Plan:     I have personally reviewed and noted the following in the patients chart:   Medical and social history Use of alcohol, tobacco or illicit drugs  Current medications and supplements including opioid prescriptions. Patient is not currently taking opioid prescriptions. Functional ability and status Nutritional status Physical activity Advanced directives List of other physicians Hospitalizations, surgeries, and ER visits in previous 12 months Vitals Screenings to include cognitive, depression, and falls Referrals and appointments  In addition, I have reviewed and discussed with patient certain preventive protocols, quality metrics, and best practice recommendations. A written personalized care plan for preventive services as well as general preventive health recommendations were provided to patient.     Clemetine Marker, LPN   [id number]   Nurse Notes: none

## 2021-04-16 NOTE — Patient Instructions (Signed)
Drew [name] , Thank you for taking time to come for your Medicare Wellness Visit. I appreciate your ongoing commitment to your health goals. Please review the following plan we discussed and let me know if I can assist you in the future.   Screening recommendations/referrals: Colonoscopy: no longer required Recommended yearly ophthalmology/optometry visit for glaucoma screening and checkup Recommended yearly dental visit for hygiene and checkup  Vaccinations: Influenza vaccine: done 12/18/20 Pneumococcal vaccine: done 12/14/13 Tdap vaccine: done 06/16/20 Shingles vaccine: done 11/27/20 & 04/02/21   Covid-19: done 04/05/19, 04/30/19 & 12/24/19  Advanced directives: Advance directive discussed with you today. I have provided a copy for you to complete at home and have notarized. Once this is complete please bring a copy in to our office so we can scan it into your chart.   Conditions/risks identified: Keep up the great work!  Next appointment: Follow up in one year for your annual wellness visit.   Preventive Care 82 Years and Older, Male Preventive care refers to lifestyle choices and visits with your health care provider that can promote health and wellness. What does preventive care include? A yearly physical exam. This is also called an annual well check. Dental exams once or twice a year. Routine eye exams. Ask your health care provider how often you should have your eyes checked. Personal lifestyle choices, including: Daily care of your teeth and gums. Regular physical activity. Eating a healthy diet. Avoiding tobacco and drug use. Limiting alcohol use. Practicing safe sex. Taking low doses of aspirin every day. Taking vitamin and mineral supplements as recommended by your health care provider. What happens during an annual well check? The services and screenings done by your health care provider during your annual well check will depend on your age, overall health, lifestyle risk  factors, and family history of disease. Counseling  Your health care provider may ask you questions about your: Alcohol use. Tobacco use. Drug use. Emotional well-being. Home and relationship well-being. Sexual activity. Eating habits. History of falls. Memory and ability to understand (cognition). Work and work Statistician. Screening  You may have the following tests or measurements: Height, weight, and BMI. Blood pressure. Lipid and cholesterol levels. These may be checked every 5 years, or more frequently if you are over 29 years old. Skin check. Lung cancer screening. You may have this screening every year starting at age 45 if you have a 30-pack-year history of smoking and currently smoke or have quit within the past 15 years. Fecal occult blood test (FOBT) of the stool. You may have this test every year starting at age 32. Flexible sigmoidoscopy or colonoscopy. You may have a sigmoidoscopy every 5 years or a colonoscopy every 10 years starting at age 41. Prostate cancer screening. Recommendations will vary depending on your family history and other risks. Hepatitis C blood test. Hepatitis B blood test. Sexually transmitted disease (STD) testing. Diabetes screening. This is done by checking your blood sugar (glucose) after you have not eaten for a while (fasting). You may have this done every 1-3 years. Abdominal aortic aneurysm (AAA) screening. You may need this if you are a current or former smoker. Osteoporosis. You may be screened starting at age 90 if you are at high risk. Talk with your health care provider about your test results, treatment options, and if necessary, the need for more tests. Vaccines  Your health care provider may recommend certain vaccines, such as: Influenza vaccine. This is recommended every year. Tetanus, diphtheria, and acellular pertussis (  Tdap, Td) vaccine. You may need a Td booster every 10 years. Zoster vaccine. You may need this after age  61. Pneumococcal 13-valent conjugate (PCV13) vaccine. One dose is recommended after age 56. Pneumococcal polysaccharide (PPSV23) vaccine. One dose is recommended after age 70. Talk to your health care provider about which screenings and vaccines you need and how often you need them. This information is not intended to replace advice given to you by your health care provider. Make sure you discuss any questions you have with your health care provider. Document Released: 03/07/2015 Document Revised: 10/29/2015 Document Reviewed: 12/10/2014 Elsevier Interactive Patient Education  2017 [place] Prevention in the Home Falls can cause injuries. They can happen to people of all ages. There are many things you can do to make your home safe and to help prevent falls. What can I do on the outside of my home? Regularly fix the edges of walkways and driveways and fix any cracks. Remove anything that might make you trip as you walk through a door, such as a raised step or threshold. Trim any bushes or trees on the path to your home. Use bright outdoor lighting. Clear any walking paths of anything that might make someone trip, such as rocks or tools. Regularly check to see if handrails are loose or broken. Make sure that both sides of any steps have handrails. Any raised decks and porches should have guardrails on the edges. Have any leaves, snow, or ice cleared regularly. Use sand or salt on walking paths during winter. Clean up any spills in your garage right away. This includes oil or grease spills. What can I do in the bathroom? Use night lights. Install grab bars by the toilet and in the tub and shower. Do not use towel bars as grab bars. Use non-skid mats or decals in the tub or shower. If you need to sit down in the shower, use a plastic, non-slip stool. Keep the floor dry. Clean up any water that spills on the floor as soon as it happens. Remove soap buildup in the tub or shower  regularly. Attach bath mats securely with double-sided non-slip rug tape. Do not have throw rugs and other things on the floor that can make you trip. What can I do in the bedroom? Use night lights. Make sure that you have a light by your bed that is easy to reach. Do not use any sheets or blankets that are too big for your bed. They should not hang down onto the floor. Have a firm chair that has side arms. You can use this for support while you get dressed. Do not have throw rugs and other things on the floor that can make you trip. What can I do in the kitchen? Clean up any spills right away. Avoid walking on wet floors. Keep items that you use a lot in easy-to-reach places. If you need to reach something above you, use a strong step stool that has a grab bar. Keep electrical cords out of the way. Do not use floor polish or wax that makes floors slippery. If you must use wax, use non-skid floor wax. Do not have throw rugs and other things on the floor that can make you trip. What can I do with my stairs? Do not leave any items on the stairs. Make sure that there are handrails on both sides of the stairs and use them. Fix handrails that are broken or loose. Make sure that handrails are  as long as the stairways. Check any carpeting to make sure that it is firmly attached to the stairs. Fix any carpet that is loose or worn. Avoid having throw rugs at the top or bottom of the stairs. If you do have throw rugs, attach them to the floor with carpet tape. Make sure that you have a light switch at the top of the stairs and the bottom of the stairs. If you do not have them, ask someone to add them for you. What else can I do to help prevent falls? Wear shoes that: Do not have high heels. Have rubber bottoms. Are comfortable and fit you well. Are closed at the toe. Do not wear sandals. If you use a stepladder: Make sure that it is fully opened. Do not climb a closed stepladder. Make sure that  both sides of the stepladder are locked into place. Ask someone to hold it for you, if possible. Clearly mark and make sure that you can see: Any grab bars or handrails. First and last steps. Where the edge of each step is. Use tools that help you move around (mobility aids) if they are needed. These include: Canes. Walkers. Scooters. Crutches. Turn on the lights when you go into a dark area. Replace any light bulbs as soon as they burn out. Set up your furniture so you have a clear path. Avoid moving your furniture around. If any of your floors are uneven, fix them. If there are any pets around you, be aware of where they are. Review your medicines with your doctor. Some medicines can make you feel dizzy. This can increase your chance of falling. Ask your doctor what other things that you can do to help prevent falls. This information is not intended to replace advice given to you by your health care provider. Make sure you discuss any questions you have with your health care provider. Document Released: 12/05/2008 Document Revised: 07/17/2015 Document Reviewed: 03/15/2014 Elsevier Interactive Patient Education  2017 [employer].

## 2021-04-21 DIAGNOSIS — E1169 Type 2 diabetes mellitus with other specified complication: Secondary | ICD-10-CM

## 2021-04-21 DIAGNOSIS — E669 Obesity, unspecified: Secondary | ICD-10-CM | POA: Diagnosis not present

## 2021-04-21 DIAGNOSIS — E785 Hyperlipidemia, unspecified: Secondary | ICD-10-CM

## 2021-04-21 NOTE — Patient Instructions (Signed)
Visit Information It was great speaking with you today!  Please let me know if you have any questions about our visit.   Goals Addressed             This Visit's Progress    Monitor and Manage My Blood Sugar-Diabetes Type 2   On track    Timeframe:  Long-Range Goal Priority:  High Start Date: 07/16/2020                             Expected End Date:  01/16/2022                      Follow Up Date 11/21/2020   -check blood sugar daily - check blood sugar if I feel it is too high or too low - enter blood sugar readings and medication or insulin into daily log - take the blood sugar log to all doctor visits    Why is this important?   Checking your blood sugar at home helps to keep it from getting very high or very low.  Writing the results in a diary or log helps the doctor know how to care for you.  Your blood sugar log should have the time, date and the results.  Also, write down the amount of insulin or other medicine that you take.  Other information, like what you ate, exercise done and how you were feeling, will also be helpful.     Notes:      Track and Manage Fluids and Swelling-Heart Failure   On track    Timeframe:  Long-Range Goal Priority:  High Start Date:  07/16/2020                           Expected End Date: 01/16/2022                      Follow Up Date 12/23/2020   - call office if I gain more than 2 pounds in one day or 5 pounds in one week - track weight in diary - use salt in moderation - watch for swelling in feet, ankles and legs every day - weigh myself daily    Why is this important?   It is important to check your weight daily and watch how much salt and liquids you have.  It will help you to manage your heart failure.    Notes:         Patient Care Plan: General Pharmacy (Adult)     Problem Identified: Hypertension, Hyperlipidemia, Diabetes, Heart Failure, Coronary Artery Disease, GERD, Chronic Kidney Disease and Gout   Priority: High      Long-Range Goal: Patient-Specific Goal   Start Date: 07/16/2020  Expected End Date: 01/16/2021  This Visit's Progress: On track  Recent Progress: On track  Priority: High  Note:   Current Barriers:  Unable to independently afford treatment regimen  Pharmacist Clinical Goal(s):  Patient will verbalize ability to afford treatment regimen maintain control of diabetes as evidenced by A1c less than 8%  maintain control of heart failure as evidenced by stable weight, lack of hospitalizations through collaboration with PharmD and provider.   Interventions: 1:1 collaboration with Steele Sizer, MD regarding development and update of comprehensive plan of care as evidenced by provider attestation and co-signature Inter-disciplinary care team collaboration (see longitudinal plan of care) Comprehensive medication review performed;  medication list updated in electronic medical record  Diabetes (A1c goal <8%) -Controlled -Current medications: Victoza 1.2 mg daily  -Medications previously tried: Metformin XR   -Current home glucose readings fasting glucose: 189, 205 (cornbread), 162, 127, 167, 128, 194,   -Denies hypoglycemic/hyperglycemic symptoms -Current meal patterns: does not follow regular dietary regimen -Current exercise: Walking through garden, with dog.  -Recommended to continue current medication  Heart Failure (Goal: manage symptoms and prevent exacerbations) -Controlled  -Managed by Dr. Saunders Revel -Last ejection fraction: WNL (Date: Jan 2018) -HF type: Diastolic -NYHA Class: I (no actitivty limitation) -AHA HF Stage: B (Heart disease present - no symptoms present) -Current treatment: Amlodipine 5 mg daily  Carvedilol 3.125 mg twice daily  Losartan 25 mg daily  -Medications previously tried: NA  -Current home BP/HR readings:   -Recommended to continue current medication.   Hyperlipidemia: (LDL goal < 70) -Controlled -Current treatment: Rosuvastatin 20 mg daily   -Current antiplatelet treatment: Aspirin 81 mg daily  -Medications previously tried: NA -Educated on Importance of limiting foods high in cholesterol; -Recommended to continue current medication  Hypothyroidism (Goal: Maintain stable thyroid function) -Controlled -Current treatment  Levothyroxine 25 mcg daily  -Medications previously tried: NA  -Recommended to continue current medication  Gout (Goal: Prevent gout flares) -Controlled  -Managed by Dr. Murlean Iba -Current treatment  Allopurinol 100 mg twice daily  -Medications previously tried: NA  -Counseled patient on low purine diet plan. Counseled patient to reduce consumption of high-fructose corn syrup, sweetened soft drinks, fruit juices, meat, and seafood. -Recommended to continue current medication  GERD (Goal: Prevent heartburn/reflux) -Controlled -Current treatment  Famotidine 20 mg daily  -Medications previously tried: NA  -Recommended to continue current medication  Patient Goals/Self-Care Activities Patient will:  - check glucose daily, document, and provide at future appointments check blood pressure twice weekly, document, and provide at future appointments weigh daily, and contact provider if weight gain of greater than 2 pounds  Follow Up Plan: Telephone follow up appointment with care management team member scheduled for:  09/23/2021 at 3:45 PM      Patient agreed to services and verbal consent obtained.   The patient verbalized understanding of instructions, educational materials, and care plan provided today and declined offer to receive copy of patient instructions, educational materials, and care plan.   Drew [name], [place] Pharmacist Practitioner  [facility] [phone]

## 2021-05-07 ENCOUNTER — Telehealth: Payer: Self-pay

## 2021-05-07 NOTE — Progress Notes (Signed)
? ? ?Chronic Care Management ?Pharmacy Assistant  ? ?Name: Drew [name]  MRN: [id number] DOB: 11/07/1940 ? ?Reason for Encounter:Hypertension Disease State Call. ?  ?Recent office visits:  ?04/16/2021 Reather Littler LPN (PCP Office) No Medication Changes noted, Medicare Wellness completed ? ?Recent consult visits:  ?No recent consult visit ? ?Hospital visits:  ?None in previous 6 months ? ?Medications: ?Outpatient Encounter Medications as of 05/07/2021  ?Medication Sig  ? acetaminophen (TYLENOL) 500 MG tablet Take 500 mg by mouth every 6 (six) hours as needed.  ? allopurinol (ZYLOPRIM) 100 MG tablet Take 100 mg by mouth 2 (two) times daily.  ? amLODipine (NORVASC) 5 MG tablet TAKE 1 TABLET BY MOUTH  DAILY  ? aspirin EC 81 MG tablet Take 81 mg by mouth daily.  ? carvedilol (COREG) 3.125 MG tablet TAKE 1 TABLET BY MOUTH  TWICE DAILY  ? Cholecalciferol (VITAMIN D3) 25 MCG (1000 UT) CAPS Take 1,000 Units by mouth daily.  ? Coenzyme Q10 (CO Q 10) 100 MG CAPS Take 100 mg by mouth daily.  ? famotidine (PEPCID) 20 MG tablet TAKE 1 TABLET BY MOUTH  DAILY  ? levothyroxine (SYNTHROID) 25 MCG tablet TAKE 1 TABLET BY MOUTH IN  THE MORNING  ? losartan (COZAAR) 25 MG tablet TAKE 1 TABLET BY MOUTH  DAILY  ? Multiple Vitamins-Minerals (CENTRUM SILVER PO) Take 200 mg by mouth daily.  ? Needle, Disp, 32G X 5/16" MISC 6 mm by Does not apply route 2 (two) times daily.  ? Omega-3 Fatty Acids (FISH OIL) 1000 MG CAPS Take 1,000 mg by mouth daily.  ? rosuvastatin (CRESTOR) 20 MG tablet TAKE 1 TABLET BY MOUTH  DAILY  ? triamcinolone cream (KENALOG) 0.1 % Apply 1 application topically 2 (two) times daily.  ? Turmeric 500 MG CAPS Take 500 mg by mouth daily.  ? VICTOZA 18 MG/3ML SOPN INJECT SUBCUTANEOUSLY 1.2  MG DAILY  ? ?No facility-administered encounter medications on file as of 05/07/2021.  ? ?Care Gaps: ?COVID-19 Vaccnie ?Ophthalmology Exam ?  ?Star Rating Drugs: ?Rosuvastatin 20 mg  last filled 03/20/2021 for 90 day supply (No pharmacy  noted) ?Losartan 25 mg last filled 02/20/2021 for 90 day supply (No pharmacy noted) ?Victoza 18 MG  last filled 02/25/2021 for 90 day supply (No pharmacy noted - receive through [employer]) ?  ?Medication Fill Gaps: ?None ID ? ?Reviewed chart prior to disease state call. Spoke with patient regarding BP ? ?Recent Office Vitals: ?BP Readings from Last 3 Encounters:  ?02/17/21 136/74  ?11/20/20 120/80  ?10/15/20 122/84  ? ?Pulse Readings from Last 3 Encounters:  ?02/17/21 72  ?11/20/20 (!) 58  ?10/15/20 86  ?  ?Wt Readings from Last 3 Encounters:  ?02/17/21 229 lb (103.9 kg)  ?11/20/20 225 lb 6 oz (102.2 kg)  ?10/15/20 225 lb (102.1 kg)  ?  ? ?Kidney Function ?Lab Results  ?Component Value Date/Time  ? CREATININE 1.19 11/20/2020 12:12 PM  ? CREATININE 1.24 (H) 06/16/2020 10:13 AM  ? CREATININE 1.32 (H) 01/08/2020 01:56 PM  ? CREATININE 1.22 (H) 05/04/2019 10:02 AM  ? GFRNONAA 55 (L) 06/16/2020 10:13 AM  ? GFRAA 63 06/16/2020 10:13 AM  ? ? ?BMP Latest Ref Rng & Units 11/20/2020 06/16/2020 01/08/2020  ?Glucose 70 - 99 mg/dL 92 048(G) 891(Q)  ?BUN 8 - 27 mg/dL 16 14 17   ?Creatinine 0.76 - 1.27 mg/dL 9.45) 0.38(U)  ?BUN/Creat Ratio 10 - 24 13 11  -  ?Sodium 134 - 144 mmol/L 140 143 138  ?Potassium  3.5 - 5.2 mmol/L 4.2 4.5 4.1  ?Chloride 96 - 106 mmol/L 105 108 104  ?CO2 20 - 29 mmol/L 17(L) 27 23  ?Calcium 8.6 - 10.2 mg/dL 9.8 9.6 9.6  ? ? ?Current antihypertensive regimen:  ?Amlodipine 5 mg daily  ?Carvedilol 3.125 mg twice daily  ?Losartan 25 mg daily  ? ?How often are you checking your Blood Pressure?  ?Patient reports he received a new blood pressure monitor from his insurance [employer], and unsure how to work it correctly.Patient states when he turns it on it gives "weird readings as in decimals". Patient states the monitor is suppose to read to him to let him know what his blood pressure is. Patient states he is unsure how to work the monitor even though he has read the booklet three times.Patient  states he has appointment with his cardiologist this month, and he will take his machine with him to see if they can help him figure it out.Inform patient he can also schedule appointment with the clinical pharmacist whom can assist with showing him how to use the monitor correctly, but patient states he will see if his cardiologist can help first. ? ?Current home BP readings: None ID ? ?What recent interventions/DTPs have been made by any provider to improve Blood Pressure control since last CPP Visit: ?None ID ? ?Any recent hospitalizations or ED visits since last visit with CPP? No ? ?What diet changes have been made to improve Blood Pressure Control?  ?Patient denies any changes with his diet, and states he receives plenty of water through out the day. ? ?What exercise is being done to improve your Blood Pressure Control?  ?Patient states he tries to be active in his garden. ? ?Adherence Review: ?Is the patient currently on ACE/ARB medication? Yes ?Does the patient have >5 day gap between last estimated fill dates? No ? ?Telephone follow up appointment with care management team member scheduled for:  09/23/2021 at 3:45 PM ? ?Bessie Kellihan,CPA ?Clinical Pharmacist Assistant ?[phone]  ? ? ?

## 2021-05-12 ENCOUNTER — Ambulatory Visit: Payer: Medicare Other | Admitting: Primary Care

## 2021-05-12 ENCOUNTER — Encounter: Payer: Self-pay | Admitting: Primary Care

## 2021-05-12 ENCOUNTER — Other Ambulatory Visit: Payer: Self-pay

## 2021-05-12 VITALS — BP 112/72 | HR 70 | Temp 97.8°F | Ht 68.0 in | Wt 226.6 lb

## 2021-05-12 DIAGNOSIS — I10 Essential (primary) hypertension: Secondary | ICD-10-CM | POA: Diagnosis not present

## 2021-05-12 DIAGNOSIS — G4733 Obstructive sleep apnea (adult) (pediatric): Secondary | ICD-10-CM | POA: Diagnosis not present

## 2021-05-12 DIAGNOSIS — G4700 Insomnia, unspecified: Secondary | ICD-10-CM | POA: Insufficient documentation

## 2021-05-12 NOTE — Assessment & Plan Note (Signed)
Stable. Well-controlled on current regimen. 

## 2021-05-12 NOTE — Progress Notes (Signed)
? ?@Patient  ID: Drew [name], male    DOB: [birth year], 81 y.o.   MRN: [id number] ? ?No chief complaint on file. ? ? ?Referring provider: ?Steele Sizer, MD ? ?HPI: ?81 year old male, former smoker.  Past medical history significant for OSA, chronic heart failure, coronary artery disease, hypertension, unstable angina, hypothyroidism, hyperlipidemia, type 2 diabetes, polyneuropathy, chronic kidney disease, thrombocytopenia.  Patient of Dr. Mortimer Fries, last seen in office on 04/16/2020. 11/11/16 PSG: AHI 29.5/hr. AutoSet 5-20 cm H2O recommended ? ?05/12/2021 ?Patient presents today for annual follow-up for OSA. Accompanied by his wife. He is doing well, no acute complaints. Compliant with CPAP use. Feels they changed his cpap mask and that it is now thinner. He uses a nasal mask. Leaking more. Sleeping ok at night, wakes up occasinoally and has some trouble falling back to sleep. He has tried 5mg  melatonin but had some daytime grogginess  ? ?Airview download 04/10/21-05/09/21 ?Usage 29/30 days (97%); 28 days (93%) > 4 hours ?Average usage 9 hours 4 mins ?Pressure 5-20cm h20 (9.3cm h20-95%) ?Airleaks 9.3L/min (95%) ?AHI 0.7 ? ? ?Allergies  ?Allergen Reactions  ? Niacin And Related Itching and Rash  ? ? ?Immunization History  ?Administered Date(s) Administered  ? Fluad Quad(high Dose 65+) 11/13/2019, 12/18/2020  ? Influenza, High Dose Seasonal PF 11/06/2014, 11/24/2015, 11/30/2016, 11/30/2017  ? Influenza-Unspecified 12/29/2018  ? PFIZER(Purple Top)SARS-COV-2 Vaccination 04/05/2019, 04/30/2019, 12/24/2019  ? Pneumococcal Conjugate-13 12/14/2013  ? Pneumococcal Polysaccharide-23 10/02/2009  ? Tdap 06/16/2020  ? Zoster Recombinat (Shingrix) 11/27/2020, 04/02/2021  ? ? ?Past Medical History:  ?Diagnosis Date  ? (HFpEF) heart failure with preserved ejection fraction ([place])   ? a. 02/2016 Echo: EF nl. Gr1 DD. Mild AI. Mod thickened AoV w/o stenosis. Nl RV size and fxn.  ? Aortic atherosclerosis ([place])   ? CAD (coronary artery disease)    ? a. 12/2019 Cor CTA: LM nl, LAD 25-49p/m, LCX small, nondom, nl, RCA large, dom, 50-69p (nl FFR), 25-73m, Ca2+ = 1602 (79th%'ile)-->Med rx.  ? Chronic kidney disease, stage III (moderate) ([place]) 08/15/2015  ? Diabetes mellitus with complication ([place])   ? GERD (gastroesophageal reflux disease)   ? H/O: upper GI bleed 2015  ? Hiatal hernia   ? a. 12/2019 Moderate-sized HH incidentally noted on Cor CTA.  ? History of stress test   ? a. 05/2016 Lexiscan MV: EF 72%, No ischemia-->Low risk.  ? Hypercholesteremia   ? Hypertension   ? Obesity (BMI 30-39.9) 08/07/2015  ? Peripheral vascular disease ([place]) 12/26/2015  ? ? ?Tobacco History: ?Social History  ? ?Tobacco Use  ?Smoking Status Former  ? Years: 1.00  ? Types: Cigarettes, Cigars  ? Quit date: 77  ? Years since quitting: 33.2  ?Smokeless Tobacco Never  ?Tobacco Comments  ? Smoked afternoon cigar  ? ?Counseling given: Not Answered ?Tobacco comments: Smoked afternoon cigar ? ? ?Outpatient Medications Prior to Visit  ?Medication Sig Dispense Refill  ? acetaminophen (TYLENOL) 500 MG tablet Take 500 mg by mouth every 6 (six) hours as needed.    ? allopurinol (ZYLOPRIM) 100 MG tablet Take 100 mg by mouth 2 (two) times daily.    ? amLODipine (NORVASC) 5 MG tablet TAKE 1 TABLET BY MOUTH  DAILY 90 tablet 3  ? aspirin EC 81 MG tablet Take 81 mg by mouth daily.    ? carvedilol (COREG) 3.125 MG tablet TAKE 1 TABLET BY MOUTH  TWICE DAILY 180 tablet 3  ? Cholecalciferol (VITAMIN D3) 25 MCG (1000 UT) CAPS Take 1,000 Units by mouth daily.    ?  Coenzyme Q10 (CO Q 10) 100 MG CAPS Take 100 mg by mouth daily.    ? famotidine (PEPCID) 20 MG tablet TAKE 1 TABLET BY MOUTH  DAILY 90 tablet 3  ? levothyroxine (SYNTHROID) 25 MCG tablet TAKE 1 TABLET BY MOUTH IN  THE MORNING 90 tablet 2  ? losartan (COZAAR) 25 MG tablet TAKE 1 TABLET BY MOUTH  DAILY 90 tablet 3  ? Multiple Vitamins-Minerals (CENTRUM SILVER PO) Take 200 mg by mouth daily.    ? Needle, Disp, 32G X 5/16" MISC 6 mm by Does not apply  route 2 (two) times daily. 100 each 3  ? Omega-3 Fatty Acids (FISH OIL) 1000 MG CAPS Take 1,000 mg by mouth daily.    ? rosuvastatin (CRESTOR) 20 MG tablet TAKE 1 TABLET BY MOUTH  DAILY 90 tablet 2  ? triamcinolone cream (KENALOG) 0.1 % Apply 1 application topically 2 (two) times daily. 30 g 0  ? Turmeric 500 MG CAPS Take 500 mg by mouth daily.    ? VICTOZA 18 MG/3ML SOPN INJECT SUBCUTANEOUSLY 1.2  MG DAILY 18 mL 3  ? ?No facility-administered medications prior to visit.  ? ?Review of Systems ? ?Review of Systems  ?Constitutional: Negative.   ?HENT: Negative.    ?Respiratory: Negative.    ?Cardiovascular: Negative.   ?Psychiatric/Behavioral:  Positive for sleep disturbance.   ? ? ?Physical Exam ? ?BP 112/72 (BP Location: Left Arm, Patient Position: Sitting, Cuff Size: Normal)   Pulse 70   Temp 97.8 ?F (36.6 ?C) (Oral)   Ht 5\' 8"  (1.727 m)   Wt 226 lb 9.6 oz (102.8 kg)   SpO2 97%   BMI 34.45 kg/m?  ?Physical Exam ?Constitutional:   ?   General: He is not in acute distress. ?   Appearance: Normal appearance. He is obese. He is not ill-appearing.  ?HENT:  ?   Head: Normocephalic and atraumatic.  ?Cardiovascular:  ?   Rate and Rhythm: Normal rate and regular rhythm.  ?   Comments: Trace BLE edema ?Pulmonary:  ?   Effort: Pulmonary effort is normal.  ?   Breath sounds: Normal breath sounds.  ?   Comments: CTA ?Abdominal:  ?   Comments: Large abd  ?Skin: ?   General: Skin is warm and dry.  ?Neurological:  ?   General: No focal deficit present.  ?   Mental Status: He is alert and oriented to person, place, and time. Mental status is at baseline.  ?Psychiatric:     ?   Mood and Affect: Mood normal.     ?   Behavior: Behavior normal.     ?   Thought Content: Thought content normal.     ?   Judgment: Judgment normal.  ?  ? ?Lab Results: ? ?CBC ?   ?Component Value Date/Time  ? WBC 6.7 11/20/2020 1212  ? WBC 5.6 06/16/2020 1013  ? RBC 4.23 11/20/2020 1212  ? RBC 4.34 06/16/2020 1013  ? HGB 14.3 11/20/2020 1212  ? HCT  40.8 11/20/2020 1212  ? PLT 161 11/20/2020 1212  ? MCV 97 11/20/2020 1212  ? MCH 33.8 (H) 11/20/2020 1212  ? MCH 34.3 (H) 06/16/2020 1013  ? MCHC 35.0 11/20/2020 1212  ? MCHC 34.3 06/16/2020 1013  ? RDW 13.8 11/20/2020 1212  ? LYMPHSABS 1.9 11/20/2020 1212  ? MONOABS 413 02/09/2016 0924  ? EOSABS 0.3 11/20/2020 1212  ? BASOSABS 0.1 11/20/2020 1212  ? ? ?BMET ?   ?Component Value Date/Time  ?  NA 140 11/20/2020 1212  ? K 4.2 11/20/2020 1212  ? CL 105 11/20/2020 1212  ? CO2 17 (L) 11/20/2020 1212  ? GLUCOSE 92 11/20/2020 1212  ? GLUCOSE 134 (H) 06/16/2020 1013  ? BUN 16 11/20/2020 1212  ? CREATININE 1.19 11/20/2020 1212  ? CREATININE 1.24 (H) 06/16/2020 1013  ? CALCIUM 9.8 11/20/2020 1212  ? GFRNONAA 55 (L) 06/16/2020 1013  ? GFRAA 63 06/16/2020 1013  ? ? ?BNP ?No results found for: BNP ? ?ProBNP ?No results found for: PROBNP ? ?Imaging: ?No results found. ? ? ?Assessment & Plan:  ? ?Obstructive sleep apnea ?- Patient is 93% compliant with CPAP > 4 hours and reports benefit from use. Having some issues with mask leaks. Uses nasal mask. Current pressure 5-20cm h20; residual AHI 0.7. Recommend adjusting pressure 5-15cm h20. If still having issues with airleaks he will need masking fitting with DME company. Continue to encourage weight loss efforts. Advised against driving if experiencing excessive daytime sleepiness. FU in 1 year or sooner if needed.  ? ?Essential hypertension ?- Stable; Well controlled on current regimen ? ?Insomnia ?- Patient has difficulty maintaining sleep at times, recommend trial melatonin 3mg  at bedtime  ? ? ?Martyn Ehrich, NP ?05/12/2021 ? ?

## 2021-05-12 NOTE — Patient Instructions (Signed)
Recommendations: ?Try 1-3mg  Melatonin at bedtime for sleep ?Continue to wear CPAP every night  ?Do not drive if experiencing excessive daytime sleepiness ? ?Orders: ?Adjust CPAP pressure 5-15cm h20; renew CPAP supplies  ? ?Follow-up: ?1 year with Dr. Belia Heman or sooner if needed  ? ? ? ?CPAP and BIPAP Information ?CPAP and BIPAP are methods that use air pressure to keep your airways open and to help you breathe well. CPAP and BIPAP use different amounts of pressure. Your health care provider will tell you whether CPAP or BIPAP would be more helpful for you. ?CPAP stands for "continuous positive airway pressure." With CPAP, the amount of pressure stays the same while you breathe in (inhale) and out (exhale). ?BIPAP stands for "bi-level positive airway pressure." With BIPAP, the amount of pressure will be higher when you inhale and lower when you exhale. This allows you to take larger breaths. ?CPAP or BIPAP may be used in the hospital, or your health care provider may want you to use it at home. You may need to have a sleep study before your health care provider can order a machine for you to use at home. ?What are the advantages? ?CPAP or BIPAP can be helpful if you have: ?Sleep apnea. ?Chronic obstructive pulmonary disease (COPD). ?Heart failure. ?Medical conditions that cause muscle weakness, including muscular dystrophy or amyotrophic lateral sclerosis (ALS). ?Other problems that cause breathing to be shallow, weak, abnormal, or difficult. ?CPAP and BIPAP are most commonly used for obstructive sleep apnea (OSA) to keep the airways from collapsing when the muscles relax during sleep. ?What are the risks? ?Generally, this is a safe treatment. However, problems may occur, including: ?Irritated skin or skin sores if the mask does not fit properly. ?Dry or stuffy nose or nosebleeds. ?Dry mouth. ?Feeling gassy or bloated. ?Sinus or lung infection if the equipment is not cleaned properly. ?When should CPAP or BIPAP be  used? ?In most cases, the mask only needs to be worn during sleep. Generally, the mask needs to be worn throughout the night and during any daytime naps. People with certain medical conditions may also need to wear the mask at other times, such as when they are awake. Follow instructions from your health care provider about when to use the machine. ?What happens during CPAP or BIPAP? ?Both CPAP and BIPAP are provided by a small machine with a flexible plastic tube that attaches to a plastic mask that you wear. Air is blown through the mask into your nose or mouth. The amount of pressure that is used to blow the air can be adjusted on the machine. Your health care provider will set the pressure setting and help you find the best mask for you. ?Tips for using the mask ?Because the mask needs to be snug, some people feel trapped or closed-in (claustrophobic) when first using the mask. If you feel this way, you may need to get used to the mask. One way to do this is to hold the mask loosely over your nose or mouth and then gradually apply the mask more snugly. You can also gradually increase the amount of time that you use the mask. ?Masks are available in various types and sizes. If your mask does not fit well, talk with your health care provider about getting a different one. Some common types of masks include: ?Full face masks, which fit over the mouth and nose. ?Nasal masks, which fit over the nose. ?Nasal pillow or prong masks, which fit into the nostrils. ?  If you are using a mask that fits over your nose and you tend to breathe through your mouth, a chin strap may be applied to help keep your mouth closed. ?Use a skin barrier to protect your skin as told by your health care provider. ?Some CPAP and BIPAP machines have alarms that may sound if the mask comes off or develops a leak. ?If you have trouble with the mask, it is very important that you talk with your health care provider about finding a way to make the  mask easier to tolerate. Do not stop using the mask. There could be a negative impact on your health if you stop using the mask. ?Tips for using the machine ?Place your CPAP or BIPAP machine on a secure table or stand near an electrical outlet. ?Know where the on/off switch is on the machine. ?Follow instructions from your health care provider about how to set the pressure on your machine and when you should use it. ?Do not eat or drink while the CPAP or BIPAP machine is on. Food or fluids could get pushed into your lungs by the pressure of the CPAP or BIPAP. ?For home use, CPAP and BIPAP machines can be rented or purchased through home health care companies. Many different brands of machines are available. Renting a machine before purchasing may help you find out which particular machine works well for you. Your health insurance company may also decide which machine you may get. ?Keep the CPAP or BIPAP machine and attachments clean. Ask your health care provider for specific instructions. ?Check the humidifier if you have a dry stuffy nose or nosebleeds. Make sure it is working correctly. ?Follow these instructions at home: ?Take over-the-counter and prescription medicines only as told by your health care provider. Ask if you can take sinus medicine if your sinuses are blocked. ?Do not use any products that contain nicotine or tobacco. These products include cigarettes, chewing tobacco, and vaping devices, such as e-cigarettes. If you need help quitting, ask your health care provider. ?Keep all follow-up visits. This is important. ?Contact a health care provider if: ?You have redness or pressure sores on your head, face, mouth, or nose from the mask or head gear. ?You have trouble using the CPAP or BIPAP machine. ?You cannot tolerate wearing the CPAP or BIPAP mask. ?Someone tells you that you snore even when wearing your CPAP or BIPAP. ?Get help right away if: ?You have trouble breathing. ?You feel  confused. ?Summary ?CPAP and BIPAP are methods that use air pressure to keep your airways open and to help you breathe well. ?If you have trouble with the mask, it is very important that you talk with your health care provider about finding a way to make the mask easier to tolerate. Do not stop using the mask. There could be a negative impact to your health if you stop using the mask. ?Follow instructions from your health care provider about when to use the machine. ?This information is not intended to replace advice given to you by your health care provider. Make sure you discuss any questions you have with your health care provider. ?Document Revised: 09/17/2020 Document Reviewed: 01/18/2020 ?Elsevier Patient Education ? 2022 [employer] Inc[employer] ? ?

## 2021-05-12 NOTE — Assessment & Plan Note (Signed)
-   Patient is 93% compliant with CPAP > 4 hours and reports benefit from use. Having some issues with mask leaks. Uses nasal mask. Current pressure 5-20cm h20; residual AHI 0.7. Recommend adjusting pressure 5-15cm h20. If still having issues with airleaks he will need masking fitting with DME company. Continue to encourage weight loss efforts. Advised against driving if experiencing excessive daytime sleepiness. FU in 1 year or sooner if needed.  ?

## 2021-05-12 NOTE — Assessment & Plan Note (Signed)
-   Patient has difficulty maintaining sleep at times, recommend trial melatonin 3mg  at bedtime  ?

## 2021-05-14 ENCOUNTER — Other Ambulatory Visit: Payer: Self-pay | Admitting: Family Medicine

## 2021-05-19 ENCOUNTER — Ambulatory Visit: Payer: Medicare Other | Admitting: Medical

## 2021-06-02 ENCOUNTER — Telehealth: Payer: Self-pay

## 2021-06-02 NOTE — Progress Notes (Signed)
? ? ?Chronic Care Management ?Pharmacy Assistant  ? ?Name: Drew [name]  MRN: [id number] DOB: [birth year] ? ?Reason for [place] Call. ?  ?Recent office visits:  ?No recent office visit ? ?Recent consult visits:  ?05/12/2021 Geraldo Pitter NP (Pulmonary) recommend trial melatonin 3mg  at bedtime, Follow up in 1 year. ? ?Hospital visits:  ?None in previous 6 months ? ?Medications: ?Outpatient Encounter Medications as of 06/02/2021  ?Medication Sig  ? acetaminophen (TYLENOL) 500 MG tablet Take 500 mg by mouth every 6 (six) hours as needed.  ? allopurinol (ZYLOPRIM) 100 MG tablet Take 100 mg by mouth 2 (two) times daily.  ? amLODipine (NORVASC) 5 MG tablet TAKE 1 TABLET BY MOUTH  DAILY  ? aspirin EC 81 MG tablet Take 81 mg by mouth daily.  ? carvedilol (COREG) 3.125 MG tablet TAKE 1 TABLET BY MOUTH  TWICE DAILY  ? Cholecalciferol (VITAMIN D3) 25 MCG (1000 UT) CAPS Take 1,000 Units by mouth daily.  ? Coenzyme Q10 (CO Q 10) 100 MG CAPS Take 100 mg by mouth daily.  ? famotidine (PEPCID) 20 MG tablet TAKE 1 TABLET BY MOUTH  DAILY  ? levothyroxine (SYNTHROID) 25 MCG tablet TAKE 1 TABLET BY MOUTH IN  THE MORNING  ? losartan (COZAAR) 25 MG tablet TAKE 1 TABLET BY MOUTH  DAILY  ? Multiple Vitamins-Minerals (CENTRUM SILVER PO) Take 200 mg by mouth daily.  ? Needle, Disp, 32G X 5/16" MISC 6 mm by Does not apply route 2 (two) times daily.  ? Omega-3 Fatty Acids (FISH OIL) 1000 MG CAPS Take 1,000 mg by mouth daily.  ? rosuvastatin (CRESTOR) 20 MG tablet TAKE 1 TABLET BY MOUTH  DAILY  ? triamcinolone cream (KENALOG) 0.1 % Apply 1 application topically 2 (two) times daily.  ? Turmeric 500 MG CAPS Take 500 mg by mouth daily.  ? VICTOZA 18 MG/3ML SOPN INJECT SUBCUTANEOUSLY 1.2  MG DAILY  ? ?No facility-administered encounter medications on file as of 06/02/2021.  ? ? ?Care Gaps: ?COVID-19 Vaccnie ?Ophthalmology Exam ?  ?Star Rating Drugs: ?Rosuvastatin 20 mg  last filled 03/20/2021 for 90 day supply at [facility] ?Losartan 25 mg last filled 05/13/2021 for 90 day supply at [facility] ?Victoza 18 MG  last filled 02/25/2021 for 90 day supply (No pharmacy noted - receive through [name]) ?  ?Medication Fill Gaps: ?None ID ? ?Reviewed chart prior to disease state call. Spoke with patient regarding BP ? ?Recent Office Vitals: ?BP Readings from Last 3 Encounters:  ?05/12/21 112/72  ?02/17/21 136/74  ?11/20/20 120/80  ? ?Pulse Readings from Last 3 Encounters:  ?05/12/21 70  ?02/17/21 72  ?11/20/20 (!) 58  ?  ?Wt Readings from Last 3 Encounters:  ?05/12/21 226 lb 9.6 oz (102.8 kg)  ?02/17/21 229 lb (103.9 kg)  ?11/20/20 225 lb 6 oz (102.2 kg)  ?  ? ?Kidney Function ?Lab Results  ?Component Value Date/Time  ? CREATININE 1.19 11/20/2020 12:12 PM  ? CREATININE 1.24 (H) 06/16/2020 10:13 AM  ? CREATININE 1.32 (H) 01/08/2020 01:56 PM  ? CREATININE 1.22 (H) 05/04/2019 10:02 AM  ? GFRNONAA 55 (L) 06/16/2020 10:13 AM  ? GFRAA 63 06/16/2020 10:13 AM  ? ? ? ?  Latest Ref Rng & Units 11/20/2020  ? 12:12 PM 06/16/2020  ? 10:13 AM 01/08/2020  ?  1:56 PM  ?BMP  ?Glucose 70 - 99 mg/dL 92   134   135    ?BUN 8 - 27 mg/dL 16  14   17    ?Creatinine 0.76 - 1.27 mg/dL 1.19   1.24   1.32    ?BUN/Creat Ratio 10 - 24 13   11      ?Sodium 134 - 144 mmol/L 140   143   138    ?Potassium 3.5 - 5.2 mmol/L 4.2   4.5   4.1    ?Chloride 96 - 106 mmol/L 105   108   104    ?CO2 20 - 29 mmol/L 17   27   23     ?Calcium 8.6 - 10.2 mg/dL 9.8   9.6   9.6    ? ? ?Current antihypertensive regimen:  ?Amlodipine 5 mg daily  ?Carvedilol 3.125 mg twice daily  ?Losartan 25 mg daily  ? ?Patient reports he has been having headaches about 3 days out of the week sometimes in the morning but mostly around 4:00 pm. Patient states he will take two tablets of tylenol around 5:00 pm which seems to take care of his headache.Patient reports he is unsure if his headache are related to his sinuses or something else.Patient reports he drinks plenty of water through out the  day. ? ?How often are you checking your Blood Pressure? ? Patient reports he received a new blood pressure monitor from his insurance [employer], and unsure how to work it correctly.Patient states when he turns it on it gives "weird readings as in decimals". Patient states he forgot to take his blood pressure monitor with him to his Pulmonology appointment.Patient reports he is unsure if he needs batteries or not.I attempted to help him through the phone but was unsuccessful. Inform patient he can bring his blood pressure monitor in with him at his follow up appointment with his PCP or we can schedule appointment with the clinical pharmacist to [place] states he would try to remember to bring his monitor in with him on 06/18/2021, and if he forgets he will return my call to schedule the appointment with the clinical pharmacist.  ? ?Current home BP readings: None ID ? ?What recent interventions/DTPs have been made by any provider to improve Blood Pressure control since last CPP Visit: None ID ? ?Any recent hospitalizations or ED visits since last visit with CPP? No ? ?What diet changes have been made to improve Blood Pressure Control?  ?Patient denies any changes with his diet, and states he receives plenty of water through out the day. ? ?What exercise is being done to improve your Blood Pressure Control?  ?Patient states he tries to be active in his garden depending on the weather. ? ?Adherence Review: ?Is the patient currently on ACE/ARB medication? Yes ?Does the patient have >5 day gap between last estimated fill dates? No ? ?Telephone follow up appointment with care management team member scheduled for:  09/23/2021 at 3:45 PM ? ?Bessie Kellihan,CPA ?Clinical Pharmacist Assistant ?[phone]  ? ? ?

## 2021-06-09 ENCOUNTER — Other Ambulatory Visit: Payer: Self-pay | Admitting: Family Medicine

## 2021-06-09 DIAGNOSIS — E034 Atrophy of thyroid (acquired): Secondary | ICD-10-CM

## 2021-06-17 NOTE — Progress Notes (Signed)
Name: Drew [name]   MRN: [id number]    DOB: 01-15-1941   Date:06/18/2021 ? ?     Progress Note ? ?Subjective ? ?Chief Complaint ? ?Follow up  ? ?HPI ? ?DMII: he has obesity, dyslipidemia, HTN and CKI. His weight is going down, but BMI is now below 35, A1C had  gone from 6.2 % to 6.9 %, 6.3% , 6.4% last level was 6.5 %  He is still taking Victoza daily and denies hypoglycemic episodes glucose , he states glucose has been controlled.  Denies polyphagia, polyuria or polydipsia.  He is on ARB and statin therapy. He denies side effects of medication . He has lower extremity neuropathy.  ? ?Thrombocytopenia: last level improved from 118 to 161 , we will recheck it today.  ? ?OSA: he is complaint with CPAP He sleeps well through the night . He wakes up feeling rested. Unchanged  ? ?HTN/CHF : bp is towards low end of normal today, he denies dizziness. They brought their medications and seems like he is taking two tablets of Losartan ( two different colors ) and maybe two of norvasc. Advised to keep bottles separated and use a M-Sunday pill box. He has intermittent chest pain, on ARB and beta blocker and norvasc.Marland Kitchen He sees Dr. Saunders Revel - cardiologist , no orthopnea, wheezing but he has intermittent sob with moderate activity . He has intermittent lower extremity edema . He is on beta-blocker and ARB, takes aspirin daily . ? ?Dyslipidemia: he is doing well on medication, last LDL was down to 44. He is under the care of Dr. Saunders Revel recently and cardiac angiogram was abnormal, ? ?Stable Angina/Atherosclerosis aorta : he has intermittent chest pain but intermittent only,  under the care of cardiologist, he does not have NTG at home. He states he has been doing well lately, no recent episodes of chest pain,  taking higher dose of Crestor and denies side effects.  ? ?Obesity  BMI is now below  35 , continue regular physical activity but he has to cut down on sugary snacks.  ? 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 Unchanged  ? 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 Candiss Norse, no pruritus, good urine output, last GFR improved , BP towards low end of normal and they will make sure not taking double dose of medications.  ? ?Hypothyroidism: on the same dose of levothyroxine , denies dysphagia, dry skin or change in bowel movements we will recheck labs  ? ?History of gastric ulcer: denies blood in stools or melena, normal appetite, no epigastric pain . He is on Pepcid , we will check 12 since symptoms of paresthesia legs  ? ?Patient Active Problem List  ? Diagnosis Date Noted  ? Insomnia 05/12/2021  ? Coronary artery disease of native artery of native heart with stable angina pectoris ([place]) 08/14/2020  ? Aortic atherosclerosis ([place]) 08/14/2020  ? Atrophy of kidney 10/10/2019  ? Edema of lower extremity 10/10/2019  ? Thrombocytopenia ([place]) 08/13/2019  ? Diabetes mellitus type 2 in obese ([place]) 08/13/2019  ? Hyperlipidemia associated with type 2 diabetes mellitus ([place]) 08/13/2019  ? Neuropathy involving both lower extremities 05/04/2019  ? Polyneuropathy, unspecified 05/04/2019  ? Multiple renal cysts 04/09/2019  ? Stable angina ([place]) 12/29/2018  ? Onychomycosis of multiple toenails with type 2 diabetes mellitus ([place]) 11/30/2017  ? Chronic heart failure with preserved ejection fraction (HFpEF) ([place]) 10/20/2017  ? Obstructive sleep apnea 02/10/2017  ? Abnormal ankle brachial  index (ABI) 11/26/2015  ? Stage 3a chronic kidney disease ([place]) 08/15/2015  ? H/O: upper GI bleed   ? Hypothyroidism due to acquired atrophy of thyroid 03/11/2015  ? History of prostatitis 11/06/2014  ? Essential hypertension 11/06/2014  ? Mixed hyperlipidemia 11/06/2014  ? Flat feet, bilateral 11/06/2014  ? Chronic multiple gastric erosions 11/06/2014  ? 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 ? ?Past Surgical History:  ?Procedure Laterality Date  ? CATARACT EXTRACTION, BILATERAL Bilateral 11/18/2017  ? COLONOSCOPY N/A  12/27/2013  ? Procedure: COLONOSCOPY;  Surgeon: Missy Sabins, MD;  Location: [facility];  Service: Endoscopy;  Laterality: N/A;  ? ENTEROSCOPY N/A 12/21/2013  ? Procedure: ENTEROSCOPY;  Surgeon: Winfield Cunas., MD;  Location: [facility] ENDOSCOPY;  Service: Endoscopy;  Laterality: N/A;  ? ENTEROSCOPY N/A 12/27/2013  ? Procedure: ENTEROSCOPY;  Surgeon: Missy Sabins, MD;  Location: [facility];  Service: Endoscopy;  Laterality: N/A;  ? ? ?Family History  ?Problem Relation Age of Onset  ? Hyperlipidemia Father   ? Kidney disease Father   ? Heart disease Father   ? Congestive Heart Failure Mother   ? Heart disease Mother   ? Hyperlipidemia Daughter   ? Hyperlipidemia Son   ? Diabetes Son   ? Anxiety disorder Daughter   ? Depression Daughter   ? Arthritis Daughter   ? ? ?Social History  ? ?Tobacco Use  ? Smoking status: Former  ?  Years: 1.00  ?  Types: Cigarettes, Cigars  ?  Quit date: 73  ?  Years since quitting: 33.3  ? Smokeless tobacco: Never  ? Tobacco comments:  ?  Smoked afternoon cigar  ?Substance Use Topics  ? Alcohol use: No  ? ? ? ?Current Outpatient Medications:  ?  acetaminophen (TYLENOL) 500 MG tablet, Take 500 mg by mouth every 6 (six) hours as needed., Disp: , Rfl:  ?  allopurinol (ZYLOPRIM) 100 MG tablet, Take 100 mg by mouth 2 (two) times daily., Disp: , Rfl:  ?  amLODipine (NORVASC) 5 MG tablet, TAKE 1 TABLET BY MOUTH  DAILY, Disp: 90 tablet, Rfl: 3 ?  aspirin EC 81 MG tablet, Take 81 mg by mouth daily., Disp: , Rfl:  ?  carvedilol (COREG) 3.125 MG tablet, TAKE 1 TABLET BY MOUTH  TWICE DAILY, Disp: 180 tablet, Rfl: 3 ?  Cholecalciferol (VITAMIN D3) 25 MCG (1000 UT) CAPS, Take 1,000 Units by mouth daily., Disp: , Rfl:  ?  Coenzyme Q10 (CO Q 10) 100 MG CAPS, Take 100 mg by mouth daily., Disp: , Rfl:  ?  famotidine (PEPCID) 20 MG tablet, TAKE 1 TABLET BY MOUTH  DAILY, Disp: 90 tablet, Rfl: 3 ?  levothyroxine (SYNTHROID) 25 MCG tablet, TAKE 1 TABLET BY MOUTH IN  THE MORNING, Disp: 90 tablet, Rfl: 0 ?   losartan (COZAAR) 25 MG tablet, TAKE 1 TABLET BY MOUTH  DAILY, Disp: 90 tablet, Rfl: 3 ?  Multiple Vitamins-Minerals (CENTRUM SILVER PO), Take 200 mg by mouth daily., Disp: , Rfl:  ?  Needle, Disp, 32G X 5/16" MISC, 6 mm by Does not apply route 2 (two) times daily., Disp: 100 each, Rfl: 3 ?  Omega-3 Fatty Acids (FISH OIL) 1000 MG CAPS, Take 1,000 mg by mouth daily., Disp: , Rfl:  ?  triamcinolone cream (KENALOG) 0.1 %, Apply 1 application topically 2 (two) times daily., Disp: 30 g, Rfl: 0 ?  Turmeric 500 MG CAPS, Take 500 mg by mouth daily., Disp: ,  Rfl:  ?  VICTOZA 18 MG/3ML SOPN, INJECT SUBCUTANEOUSLY 1.2  MG DAILY, Disp: 18 mL, Rfl: 3 ?  rosuvastatin (CRESTOR) 20 MG tablet, Take 1 tablet (20 mg total) by mouth daily., Disp: 90 tablet, Rfl: 1 ? ?Allergies  ?Allergen Reactions  ? Niacin And Related Itching and Rash  ? ? ?I personally reviewed active problem list, medication list, allergies, family history, social history with the patient/caregiver today. ? ? ?ROS ? ?Constitutional: Negative for fever or weight change.  ?Respiratory: Negative for cough, he has  shortness of breath with activity .   ?Cardiovascular: positive for intermittent chest pain but no palpitations.  ?Gastrointestinal: Negative for abdominal pain, no bowel changes.  ?Musculoskeletal: Negative for gait problem or joint swelling.  ?Skin: Negative for rash.  ?Neurological: Negative for dizziness or headache.  ?No other specific complaints in a complete review of systems (except as listed in HPI above).  ? ?Objective ? ?Vitals:  ? 06/18/21 1000  ?BP: 112/74  ?Pulse: 67  ?Resp: 20  ?Temp: 97.9 ?F (36.6 ?C)  ?TempSrc: Oral  ?SpO2: 94%  ?Weight: 224 lb 14.4 oz (102 kg)  ?Height: 5\' 8"  (1.727 m)  ? ? ?Body mass index is 34.2 kg/m?. ? ?Physical Exam ? ?Constitutional: Patient appears well-developed and well-nourished. Obese  No distress.  ?HEENT: head atraumatic, normocephalic, pupils equal and reactive to light, neck supple ?Cardiovascular: Normal  rate, regular rhythm and normal heart sounds.  No murmur heard. No BLE edema. ?Pulmonary/Chest: Effort normal and breath sounds normal. No respiratory distress. ?Abdominal: Soft.  There is no tenderness. ?P

## 2021-06-18 ENCOUNTER — Ambulatory Visit (INDEPENDENT_AMBULATORY_CARE_PROVIDER_SITE_OTHER): Payer: Medicare Other | Admitting: Family Medicine

## 2021-06-18 ENCOUNTER — Encounter: Payer: Self-pay | Admitting: Family Medicine

## 2021-06-18 VITALS — BP 112/74 | HR 67 | Temp 97.9°F | Resp 20 | Ht 68.0 in | Wt 224.9 lb

## 2021-06-18 DIAGNOSIS — N1831 Chronic kidney disease, stage 3a: Secondary | ICD-10-CM

## 2021-06-18 DIAGNOSIS — I152 Hypertension secondary to endocrine disorders: Secondary | ICD-10-CM

## 2021-06-18 DIAGNOSIS — I208 Other forms of angina pectoris: Secondary | ICD-10-CM | POA: Diagnosis not present

## 2021-06-18 DIAGNOSIS — I700 Atherosclerosis of aorta: Secondary | ICD-10-CM

## 2021-06-18 DIAGNOSIS — I5032 Chronic diastolic (congestive) heart failure: Secondary | ICD-10-CM | POA: Diagnosis not present

## 2021-06-18 DIAGNOSIS — E1169 Type 2 diabetes mellitus with other specified complication: Secondary | ICD-10-CM

## 2021-06-18 DIAGNOSIS — D696 Thrombocytopenia, unspecified: Secondary | ICD-10-CM

## 2021-06-18 DIAGNOSIS — Z9989 Dependence on other enabling machines and devices: Secondary | ICD-10-CM

## 2021-06-18 DIAGNOSIS — E1122 Type 2 diabetes mellitus with diabetic chronic kidney disease: Secondary | ICD-10-CM | POA: Diagnosis not present

## 2021-06-18 DIAGNOSIS — G4733 Obstructive sleep apnea (adult) (pediatric): Secondary | ICD-10-CM

## 2021-06-18 DIAGNOSIS — E1159 Type 2 diabetes mellitus with other circulatory complications: Secondary | ICD-10-CM

## 2021-06-18 DIAGNOSIS — R202 Paresthesia of skin: Secondary | ICD-10-CM

## 2021-06-18 DIAGNOSIS — M109 Gout, unspecified: Secondary | ICD-10-CM

## 2021-06-18 DIAGNOSIS — Z8711 Personal history of peptic ulcer disease: Secondary | ICD-10-CM

## 2021-06-18 DIAGNOSIS — E669 Obesity, unspecified: Secondary | ICD-10-CM

## 2021-06-18 DIAGNOSIS — I10 Essential (primary) hypertension: Secondary | ICD-10-CM

## 2021-06-18 DIAGNOSIS — E034 Atrophy of thyroid (acquired): Secondary | ICD-10-CM

## 2021-06-18 DIAGNOSIS — E1142 Type 2 diabetes mellitus with diabetic polyneuropathy: Secondary | ICD-10-CM

## 2021-06-18 MED ORDER — ROSUVASTATIN CALCIUM 20 MG PO TABS: 20.0000 mg | ORAL_TABLET | Freq: Every day | ORAL | 1 refills | Status: DC

## 2021-06-19 ENCOUNTER — Other Ambulatory Visit: Payer: Self-pay | Admitting: Family Medicine

## 2021-06-19 ENCOUNTER — Other Ambulatory Visit: Payer: Self-pay | Admitting: Internal Medicine

## 2021-06-19 DIAGNOSIS — Z8711 Personal history of peptic ulcer disease: Secondary | ICD-10-CM

## 2021-06-19 LAB — HEMOGLOBIN A1C
Hgb A1c MFr Bld: 6.4 % of total Hgb — ABNORMAL HIGH (ref <5.7)
Mean Plasma Glucose: 137 mg/dL
eAG (mmol/L): 7.6 mmol/L

## 2021-06-19 LAB — COMPLETE METABOLIC PANEL WITH GFR
AG Ratio: 1.8 (calc) (ref 1.0–2.5)
ALT: 36 U/L (ref 9–46)
AST: 27 U/L (ref 10–35)
Albumin: 4.5 g/dL (ref 3.6–5.1)
Alkaline phosphatase (APISO): 69 U/L (ref 35–144)
BUN/Creatinine Ratio: 12 (calc) (ref 6–22)
BUN: 15 mg/dL (ref 7–25)
CO2: 25 mmol/L (ref 20–32)
Calcium: 9.9 mg/dL (ref 8.6–10.3)
Chloride: 108 mmol/L (ref 98–110)
Creat: 1.25 mg/dL — ABNORMAL HIGH (ref 0.70–1.22)
Globulin: 2.5 g/dL (calc) (ref 1.9–3.7)
Glucose, Bld: 106 mg/dL — ABNORMAL HIGH (ref 65–99)
Potassium: 4.5 mmol/L (ref 3.5–5.3)
Sodium: 142 mmol/L (ref 135–146)
Total Bilirubin: 0.9 mg/dL (ref 0.2–1.2)
Total Protein: 7 g/dL (ref 6.1–8.1)
eGFR: 58 mL/min/{1.73_m2} — ABNORMAL LOW (ref >=60)

## 2021-06-19 LAB — LIPID PANEL
Cholesterol: 102 mg/dL (ref <200)
HDL: 32 mg/dL — ABNORMAL LOW (ref >=40)
LDL Cholesterol (Calc): 43 mg/dL (calc)
Non-HDL Cholesterol (Calc): 70 mg/dL (calc) (ref <130)
Total CHOL/HDL Ratio: 3.2 (calc) (ref <5.0)
Triglycerides: 197 mg/dL — ABNORMAL HIGH (ref <150)

## 2021-06-19 LAB — CBC WITH DIFFERENTIAL/PLATELET
Absolute Monocytes: 583 cells/uL (ref 200–950)
Basophils Absolute: 72 cells/uL (ref 0–200)
Basophils Relative: 1 %
Eosinophils Absolute: 403 cells/uL (ref 15–500)
Eosinophils Relative: 5.6 %
HCT: 44.5 % (ref 38.5–50.0)
Hemoglobin: 15 g/dL (ref 13.2–17.1)
Lymphs Abs: 2023 cells/uL (ref 850–3900)
MCH: 32.9 pg (ref 27.0–33.0)
MCHC: 33.7 g/dL (ref 32.0–36.0)
MCV: 97.6 fL (ref 80.0–100.0)
MPV: 11.1 fL (ref 7.5–12.5)
Monocytes Relative: 8.1 %
Neutro Abs: 4118 cells/uL (ref 1500–7800)
Neutrophils Relative %: 57.2 %
Platelets: 152 10*3/uL (ref 140–400)
RBC: 4.56 10*6/uL (ref 4.20–5.80)
RDW: 13.5 % (ref 11.0–15.0)
Total Lymphocyte: 28.1 %
WBC: 7.2 10*3/uL (ref 3.8–10.8)

## 2021-06-19 LAB — B12 AND FOLATE PANEL
Folate: 23.9 ng/mL
Vitamin B-12: 512 pg/mL (ref 200–1100)

## 2021-06-19 LAB — MICROALBUMIN / CREATININE URINE RATIO
Creatinine, Urine: 104 mg/dL (ref 20–320)
Microalb Creat Ratio: 89 mcg/mg creat — ABNORMAL HIGH (ref <30)
Microalb, Ur: 9.3 mg/dL

## 2021-06-19 LAB — TSH: TSH: 2.19 mIU/L (ref 0.40–4.50)

## 2021-06-19 LAB — VITAMIN D 25 HYDROXY (VIT D DEFICIENCY, FRACTURES): Vit D, 25-Hydroxy: 39 ng/mL (ref 30–100)

## 2021-06-23 ENCOUNTER — Ambulatory Visit: Payer: Medicare Other | Admitting: Medical

## 2021-06-23 ENCOUNTER — Encounter: Payer: Self-pay | Admitting: Medical

## 2021-06-23 VITALS — BP 130/70 | HR 70 | Ht 68.0 in | Wt 225.0 lb

## 2021-06-23 DIAGNOSIS — E782 Mixed hyperlipidemia: Secondary | ICD-10-CM

## 2021-06-23 DIAGNOSIS — I5032 Chronic diastolic (congestive) heart failure: Secondary | ICD-10-CM

## 2021-06-23 DIAGNOSIS — I2510 Atherosclerotic heart disease of native coronary artery without angina pectoris: Secondary | ICD-10-CM | POA: Diagnosis not present

## 2021-06-23 DIAGNOSIS — R0602 Shortness of breath: Secondary | ICD-10-CM | POA: Diagnosis not present

## 2021-06-23 DIAGNOSIS — G4733 Obstructive sleep apnea (adult) (pediatric): Secondary | ICD-10-CM

## 2021-06-23 MED ORDER — EMPAGLIFLOZIN 10 MG PO TABS: 10.0000 mg | ORAL_TABLET | Freq: Every day | ORAL | 0 refills | Status: AC

## 2021-06-23 NOTE — Progress Notes (Signed)
?Cardiology Office Note:   ? ?Date:  06/23/2021  ? ?ID:  Drew [name], DOB 1941/01/29, MRN [id number] ? ?PCP:  Steele Sizer, MD  ?[facility] Cardiologist:  Nelva Bush, MD  ?[facility] Electrophysiologist:  None  ? ?Referring MD: Steele Sizer, MD  ? ?Chief Complaint: 6 month follow-up ? ?History of Present Illness:   ? ?Drew [name] is a 81 y.o. male with a hx of HFpEF, OSA on CPAP, nonobstructive CAD, HTN, HLD, CKD stage 3, GERD, obesity for hospital follow-up.  ?  ?He had prior coronary CTA 2021 showing extensive coronary artery calcification with moderate multivessel disease that was not significant by FFR.  ?  ?Last seen 11/20/20 and reported SOB and chest pain. Myoveiw and echo were ordered. Myoview was overall low risk with no evidence of ischemia, coronary artery calcifications noted int he LAD and RCA. Echo showed LVEF 55-60%, no WMA, G1DD, mild MR, mild AI. ? ?Today, the patient reports exertional shortness of breath. He feels it's from deconditioning and not doing much at baseline. Now that he's being more active, he notices his breathing is a little short. He has been doing more yard work. Also having issues with allergies.  Feels breathing is not worse than normal. Reports LLE. Eats salt occasionally. He reports rare chest pain. ? ?Past Medical History:  ?Diagnosis Date  ? (HFpEF) heart failure with preserved ejection fraction ([place])   ? a. 02/2016 Echo: EF nl. Gr1 DD. Mild AI. Mod thickened AoV w/o stenosis. Nl RV size and fxn.  ? Aortic atherosclerosis ([place])   ? CAD (coronary artery disease)   ? a. 12/2019 Cor CTA: LM nl, LAD 25-49p/m, LCX small, nondom, nl, RCA large, dom, 50-69p (nl FFR), 25-65m, Ca2+ = 1602 (79th%'ile)-->Med rx.  ? Chronic kidney disease, stage III (moderate) ([place]) 08/15/2015  ? Diabetes mellitus with complication ([place])   ? GERD (gastroesophageal reflux disease)   ? H/O: upper GI bleed 2015  ? Hiatal hernia   ? a. 12/2019 Moderate-sized HH incidentally noted on Cor CTA.   ? History of stress test   ? a. 05/2016 Lexiscan MV: EF 72%, No ischemia-->Low risk.  ? Hypercholesteremia   ? Hypertension   ? Obesity (BMI 30-39.9) 08/07/2015  ? Peripheral vascular disease ([place]) 12/26/2015  ? ? ?Past Surgical History:  ?Procedure Laterality Date  ? CATARACT EXTRACTION, BILATERAL Bilateral 11/18/2017  ? COLONOSCOPY N/A 12/27/2013  ? Procedure: COLONOSCOPY;  Surgeon: Missy Sabins, MD;  Location: [place];  Service: Endoscopy;  Laterality: N/A;  ? ENTEROSCOPY N/A 12/21/2013  ? Procedure: ENTEROSCOPY;  Surgeon: Winfield Cunas., MD;  Location: [facility] ENDOSCOPY;  Service: Endoscopy;  Laterality: N/A;  ? ENTEROSCOPY N/A 12/27/2013  ? Procedure: ENTEROSCOPY;  Surgeon: Missy Sabins, MD;  Location: [place];  Service: Endoscopy;  Laterality: N/A;  ? ? ?Current Medications: ?Current Meds  ?Medication Sig  ? acetaminophen (TYLENOL) 500 MG tablet Take 500 mg by mouth every 6 (six) hours as needed.  ? allopurinol (ZYLOPRIM) 100 MG tablet Take 100 mg by mouth 2 (two) times daily.  ? amLODipine (NORVASC) 5 MG tablet TAKE 1 TABLET BY MOUTH  DAILY  ? aspirin EC 81 MG tablet Take 81 mg by mouth daily.  ? carvedilol (COREG) 3.125 MG tablet TAKE 1 TABLET BY MOUTH  TWICE DAILY  ? Cholecalciferol (VITAMIN D3) 25 MCG (1000 UT) CAPS Take 1,000 Units by mouth daily.  ? Coenzyme Q10 (CO Q 10) 100 MG CAPS Take 100 mg by  mouth daily.  ? empagliflozin (JARDIANCE) 10 MG TABS tablet Take 1 tablet (10 mg total) by mouth daily before breakfast.  ? famotidine (PEPCID) 20 MG tablet TAKE 1 TABLET BY MOUTH  DAILY  ? levothyroxine (SYNTHROID) 25 MCG tablet TAKE 1 TABLET BY MOUTH IN  THE MORNING  ? losartan (COZAAR) 25 MG tablet TAKE 1 TABLET BY MOUTH  DAILY  ? Multiple Vitamins-Minerals (CENTRUM SILVER PO) Take 200 mg by mouth daily.  ? Needle, Disp, 32G X 5/16" MISC 6 mm by Does not apply route 2 (two) times daily.  ? Omega-3 Fatty Acids (FISH OIL) 1000 MG CAPS Take 1,000 mg by mouth daily.  ? rosuvastatin (CRESTOR) 20 MG tablet  Take 1 tablet (20 mg total) by mouth daily.  ? triamcinolone cream (KENALOG) 0.1 % Apply 1 application topically 2 (two) times daily.  ? Turmeric 500 MG CAPS Take 500 mg by mouth daily.  ? VICTOZA 18 MG/3ML SOPN INJECT SUBCUTANEOUSLY 1.2  MG DAILY  ?  ? ?Allergies:   Niacin and related  ? ?Social History  ? ?Socioeconomic History  ? Marital status: Married  ?  Spouse name: Not on file  ? Number of children: 4  ? Years of education: Not on file  ? Highest education level: High school graduate  ?Occupational History  ? Occupation: retired  ?Tobacco Use  ? Smoking status: Former  ?  Years: 1.00  ?  Types: Cigarettes, Cigars  ?  Quit date: 47  ?  Years since quitting: 33.3  ? Smokeless tobacco: Never  ? Tobacco comments:  ?  Smoked afternoon cigar  ?Vaping Use  ? Vaping Use: Never used  ?Substance and Sexual Activity  ? Alcohol use: No  ? Drug use: No  ? Sexual activity: Yes  ?Other Topics Concern  ? Not on file  ?Social History Narrative  ? Not on file  ? ?Social Determinants of Health  ? ?Financial Resource Strain: High Risk  ? Difficulty of Paying Living Expenses: Hard  ?Food Insecurity: No Food Insecurity  ? Worried About Charity fundraiser in the Last Year: Never true  ? Ran Out of Food in the Last Year: Never true  ?Transportation Needs: No Transportation Needs  ? Lack of Transportation (Medical): No  ? Lack of Transportation (Non-Medical): No  ?Physical Activity: Insufficiently Active  ? Days of Exercise per Week: 5 days  ? Minutes of Exercise per Session: 20 min  ?Stress: No Stress Concern Present  ? Feeling of Stress : Only a little  ?Social Connections: Moderately Integrated  ? Frequency of Communication with Friends and Family: More than three times a week  ? Frequency of Social Gatherings with Friends and Family: Twice a week  ? Attends Religious Services: More than 4 times per year  ? Active Member of Clubs or Organizations: No  ? Attends Archivist Meetings: Never  ? Marital Status: Married  ?   ? ?Family History: ?The patient's family history includes Anxiety disorder in his daughter; Arthritis in his daughter; Congestive Heart Failure in his mother; Depression in his daughter; Diabetes in his son; Heart disease in his father and mother; Hyperlipidemia in his daughter, father, and son; Kidney disease in his father. ? ?ROS:   ?Please see the history of present illness.    ? All other systems reviewed and are negative. ? ?EKGs/Labs/Other Studies Reviewed:   ? ?The following studies were reviewed today: ? ?Echo 12/2020 ? 1. Left ventricular ejection fraction, by estimation,  is 55 to 60%. The  ?left ventricle has normal function. The left ventricle has no regional  ?wall motion abnormalities. Left ventricular diastolic parameters are  ?consistent with Grade I diastolic  ?dysfunction (impaired relaxation).  ? 2. Right ventricular systolic function is normal. The right ventricular  ?size is normal. There is normal pulmonary artery systolic pressure. The  ?estimated right ventricular systolic pressure is 99991111 mmHg.  ? 3. The mitral valve is normal in structure. Mild mitral valve  ?regurgitation. No evidence of mitral stenosis.  ? 4. The aortic valve was not well visualized. Aortic valve regurgitation  ?is mild. Mild to moderate aortic valve sclerosis/calcification is present,  ?without any evidence of aortic stenosis.  ? 5. The inferior vena cava is normal in size with greater than 50%  ?respiratory variability, suggesting right atrial pressure of 3 mmHg.  ? ?Comparison(s): LVEF 55-60%.  ? ?Myoview Lexiscan 11/2020 ?  Marland Kitchen The study is low risk. ?  There is no evidence for ischemia. ?  LV perfusion is normal. There is no evidence of ischemia. ?  The left ventricular ejection fraction is normal (55-65%). ?  Coronary artery calcifications noted in the LAD and RCA ?  ? ?EKG:  EKG is   ordered today.  The ekg ordered today demonstrates NSR, 70bpm, LAFB, LAD, nonspecific ST wave changes.  ? ?Recent Labs: ?06/18/2021: ALT  36; BUN 15; Creat 1.25; Hemoglobin 15.0; Platelets 152; Potassium 4.5; Sodium 142; TSH 2.19  ?Recent Lipid Panel ?   ?Component Value Date/Time  ? CHOL 102 06/18/2021 1052  ? TRIG 197 (H) 04/27/202

## 2021-06-23 NOTE — Patient Instructions (Signed)
Medication Instructions:  ? ?Your physician has recommended you make the following change in your medication:  ? ?START Jardiance 10 mg daily  ? ?A 30 day supply was sent to your local pharmacy. You may request refills for 90 day to mail order. ? ?*If you need a refill on your cardiac medications before your next appointment, please call your pharmacy* ? ? ?Lab Work: ? ?Please return to the [facility] for labs (BMET) in 2 weeks (week of 07/06/21) ? ?-  Please go to the [facility].  ?-  You will check in at the front desk to the right as you walk into the atrium.  ?-  Valet Parking is offered if needed. ?-  No appointment needed. You may go any day between 7 am and 6 pm. ? ? ?Testing/Procedures: ? ?None ordered ? ? ?Follow-Up: ?At [facility], you and your health needs are our priority.  As part of our continuing mission to provide you with exceptional heart care, we have created designated Provider Care Teams.  These Care Teams include your primary Cardiologist (physician) and Advanced Practice Providers (APPs -  Physician Assistants and Nurse Practitioners) who all work together to provide you with the care you need, when you need it. ? ?We recommend signing up for the patient portal called "MyChart".  Sign up information is provided on this After Visit Summary.  MyChart is used to connect with patients for Virtual Visits (Telemedicine).  Patients are able to view lab/test results, encounter notes, upcoming appointments, etc.  Non-urgent messages can be sent to your provider as well.   ?To learn more about what you can do with MyChart, go to [email or web address].   ? ?Your next appointment:   ?6 month(s) ? ?The format for your next appointment:   ?In Person ? ?Provider:   ?You may see Dr. Cristal Deer End or one of the following Advanced Practice Providers on your designated Care Team:   ?Nicolasa Ducking, NP ?Eula Listen, PA-C ?Cadence Fransico Michael, PA-C{ ? ?Important Information About Sugar ? ? ? ? ? ? ?

## 2021-06-26 ENCOUNTER — Telehealth: Payer: Self-pay

## 2021-06-26 ENCOUNTER — Telehealth: Payer: Self-pay | Admitting: Medical

## 2021-06-26 NOTE — Telephone Encounter (Signed)
Patient returned application for medication assistance (jardiance)  ? ?Placed in nurse box.  ? ?Patient states he has some samples but was not able to pick up new rx due to cost  ?

## 2021-06-26 NOTE — Progress Notes (Signed)
? ? ?Chronic Care Management ?Pharmacy Assistant  ? ?Name: Drew [name]  MRN: [id number] DOB: [birth year] ? ?Reason for Encounter:Diabetes Disease State Call. ?  ?Recent office visits:  ?06/18/2021 Dr. Carlynn Purl MD (PCP) No Medication Changes noted, return in 4 months ? ?Recent consult visits:  ?06/23/2021 Cadence Lorna Few (Cardiology) Start Jardiance 10 mg daily, If LLE worsens stop amlodipine , Follow up in 6 month ? ?Hospital visits:  ?None in previous 6 months ? ?Medications: ?Outpatient Encounter Medications as of 06/26/2021  ?Medication Sig  ? acetaminophen (TYLENOL) 500 MG tablet Take 500 mg by mouth every 6 (six) hours as needed.  ? allopurinol (ZYLOPRIM) 100 MG tablet Take 100 mg by mouth 2 (two) times daily.  ? amLODipine (NORVASC) 5 MG tablet TAKE 1 TABLET BY MOUTH  DAILY  ? aspirin EC 81 MG tablet Take 81 mg by mouth daily.  ? carvedilol (COREG) 3.125 MG tablet TAKE 1 TABLET BY MOUTH  TWICE DAILY  ? Cholecalciferol (VITAMIN D3) 25 MCG (1000 UT) CAPS Take 1,000 Units by mouth daily.  ? Coenzyme Q10 (CO Q 10) 100 MG CAPS Take 100 mg by mouth daily.  ? empagliflozin (JARDIANCE) 10 MG TABS tablet Take 1 tablet (10 mg total) by mouth daily before breakfast.  ? famotidine (PEPCID) 20 MG tablet TAKE 1 TABLET BY MOUTH  DAILY  ? levothyroxine (SYNTHROID) 25 MCG tablet TAKE 1 TABLET BY MOUTH IN  THE MORNING  ? losartan (COZAAR) 25 MG tablet TAKE 1 TABLET BY MOUTH  DAILY  ? Multiple Vitamins-Minerals (CENTRUM SILVER PO) Take 200 mg by mouth daily.  ? Needle, Disp, 32G X 5/16" MISC 6 mm by Does not apply route 2 (two) times daily.  ? Omega-3 Fatty Acids (FISH OIL) 1000 MG CAPS Take 1,000 mg by mouth daily.  ? rosuvastatin (CRESTOR) 20 MG tablet Take 1 tablet (20 mg total) by mouth daily.  ? triamcinolone cream (KENALOG) 0.1 % Apply 1 application topically 2 (two) times daily.  ? Turmeric 500 MG CAPS Take 500 mg by mouth daily.  ? VICTOZA 18 MG/3ML SOPN INJECT SUBCUTANEOUSLY 1.2  MG DAILY  ? ?No facility-administered  encounter medications on file as of 06/26/2021.  ? ? ?Care Gaps: ?COVID-19 Vaccnie ?Ophthalmology Exam ?  ?Star Rating Drugs: ?Rosuvastatin 20 mg  last filled 03/20/2021 for 90 day supply at [facility] pharmacy ?Losartan 25 mg last filled 05/13/2021 for 90 day supply at [facility] pharmacy ?Victoza 18 MG  last filled 02/25/2021 for 90 day supply at [facility] (- receive through [employer]) ?Jardiance 10 mg - Not filled report ?  ?Medication Fill Gaps: ?None ID ? ?Recent Relevant Labs: ?Lab Results  ?Component Value Date/Time  ? HGBA1C 6.4 (H) 06/18/2021 10:52 AM  ? HGBA1C 6.5 (A) 02/17/2021 10:00 AM  ? HGBA1C 6.4 (A) 10/15/2020 11:03 AM  ? HGBA1C 6.2 (H) 05/04/2019 10:02 AM  ? MICROALBUR 9.3 06/18/2021 10:52 AM  ? MICROALBUR 2.2 06/16/2020 10:13 AM  ?  ?Kidney Function ?Lab Results  ?Component Value Date/Time  ? CREATININE 1.25 (H) 06/18/2021 10:52 AM  ? CREATININE 1.19 11/20/2020 12:12 PM  ? CREATININE 1.24 (H) 06/16/2020 10:13 AM  ? GFRNONAA 55 (L) 06/16/2020 10:13 AM  ? GFRAA 63 06/16/2020 10:13 AM  ? ? ?Current antihyperglycemic regimen:  ?Victoza 1.2 mg daily  ? ? ?What recent interventions/DTPs have been made to improve glycemic control:  ?06/23/2021 Cadence Lorna Few (Cardiology) Start Jardiance 10 mg daily. ?Patient reports he is unable to fill Jardiance due  the cost.Patient states his Cardiologist office gave him patient assistance application which he has completed.Patient reports he will return his application to his cardiologist when he goes to get his blood work completed on 07/06/2021. ? ?Have there been any recent hospitalizations or ED visits since last visit with CPP? No ? ?Patient reports hypoglycemic symptoms, including Hungry ? ?Patient denies hyperglycemic symptoms, including blurry vision, excessive thirst, fatigue, polyuria, and weakness ? ?How often are you checking your blood sugar? once daily ? ?What are your blood sugars ranging?  ?Fasting: None ID ? ?Before meals: None ID ? ?After meals -  After breakfast:  ?On 06/26/2021 it was 264. ?On 06/25/2021 it was 264. ?On 06/24/2021 it was 171. ?On 06/23/2021 it was 114. ?On 06/22/2021 it was 168. ?On 06/21/2021 it was 181. ?On 06/20/2021 None ID ?On 06/19/2021 it was 215. ? ?Bedtime: None ID ? ?During the week, how often does your blood glucose drop below 70? Never ? ?Are you checking your feet daily/regularly?  ? 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 ? ?Adherence Review: ?Is the patient currently on a STATIN medication? Yes ?Is the patient currently on ACE/ARB medication? Yes ?Does the patient have >5 day gap between last estimated fill dates? Yes ? ?Telephone follow up appointment with care management team member scheduled for:  09/23/2021 at 3:45 PM ? ?Bessie Kellihan,CPA ?Clinical Pharmacist Assistant ?[phone]  ? ? ?

## 2021-06-29 NOTE — Telephone Encounter (Signed)
Patient assistance applicate for Jardiance faxed to [place]. ?Fax confirmation received. ?

## 2021-07-07 ENCOUNTER — Other Ambulatory Visit: Payer: Self-pay | Admitting: Internal Medicine

## 2021-07-07 ENCOUNTER — Other Ambulatory Visit
Admission: RE | Admit: 2021-07-07 | Discharge: 2021-07-07 | Disposition: A | Discharge status: Home / Self Care | Payer: Medicare Other | Attending: Medical | Admitting: Medical

## 2021-07-07 DIAGNOSIS — I5032 Chronic diastolic (congestive) heart failure: Secondary | ICD-10-CM | POA: Insufficient documentation

## 2021-07-07 DIAGNOSIS — I2510 Atherosclerotic heart disease of native coronary artery without angina pectoris: Secondary | ICD-10-CM | POA: Insufficient documentation

## 2021-07-07 DIAGNOSIS — R0602 Shortness of breath: Secondary | ICD-10-CM | POA: Diagnosis present

## 2021-07-07 LAB — BASIC METABOLIC PANEL
Anion gap: 9 (ref 5–15)
BUN: 17 mg/dL (ref 8–23)
CO2: 22 mmol/L (ref 22–32)
Calcium: 9.9 mg/dL (ref 8.9–10.3)
Chloride: 109 mmol/L (ref 98–111)
Creatinine, Ser: 1.2 mg/dL (ref 0.61–1.24)
GFR, Estimated: >60 mL/min (ref >60)
Glucose, Bld: 160 mg/dL — ABNORMAL HIGH (ref 70–99)
Potassium: 4.1 mmol/L (ref 3.5–5.1)
Sodium: 140 mmol/L (ref 135–145)

## 2021-08-27 ENCOUNTER — Telehealth: Payer: Self-pay

## 2021-08-27 NOTE — Progress Notes (Signed)
Chronic Care Management Pharmacy Assistant   Name: ROBEY MASSMANN  MRN: [id number] DOB: 14-Jun-1940  Reason for Encounter: Hypertension Disease State Call.  Recent office visits:  No recent office visit  Recent consult visits:  07/27/2021 Dr. Thedore Mins MD (Nephrology) Unable to see note  Hospital visits:  None in previous 6 months  Medications: Outpatient Encounter Medications as of 08/27/2021  Medication Sig   acetaminophen (TYLENOL) 500 MG tablet Take 500 mg by mouth every 6 (six) hours as needed.   allopurinol (ZYLOPRIM) 100 MG tablet Take 100 mg by mouth 2 (two) times daily.   amLODipine (NORVASC) 5 MG tablet TAKE 1 TABLET BY MOUTH  DAILY   aspirin EC 81 MG tablet Take 81 mg by mouth daily.   carvedilol (COREG) 3.125 MG tablet TAKE 1 TABLET BY MOUTH  TWICE DAILY   Cholecalciferol (VITAMIN D3) 25 MCG (1000 UT) CAPS Take 1,000 Units by mouth daily.   Coenzyme Q10 (CO Q 10) 100 MG CAPS Take 100 mg by mouth daily.   famotidine (PEPCID) 20 MG tablet TAKE 1 TABLET BY MOUTH  DAILY   levothyroxine (SYNTHROID) 25 MCG tablet TAKE 1 TABLET BY MOUTH IN  THE MORNING   losartan (COZAAR) 25 MG tablet TAKE 1 TABLET BY MOUTH  DAILY   Multiple Vitamins-Minerals (CENTRUM SILVER PO) Take 200 mg by mouth daily.   Needle, Disp, 32G X 5/16" MISC 6 mm by Does not apply route 2 (two) times daily.   Omega-3 Fatty Acids (FISH OIL) 1000 MG CAPS Take 1,000 mg by mouth daily.   rosuvastatin (CRESTOR) 20 MG tablet Take 1 tablet (20 mg total) by mouth daily.   triamcinolone cream (KENALOG) 0.1 % Apply 1 application topically 2 (two) times daily.   Turmeric 500 MG CAPS Take 500 mg by mouth daily.   VICTOZA 18 MG/3ML SOPN INJECT SUBCUTANEOUSLY 1.2  MG DAILY   No facility-administered encounter medications on file as of 08/27/2021.    Care Gaps: COVID-19 Vaccnie Ophthalmology Exam   Star Rating Drugs: Rosuvastatin 20 mg  last filled 03/20/2021 for 90 day supply at [facility] pharmacy Losartan 25 mg last filled  05/13/2021 for 90 day supply at [facility] pharmacy Victoza 18 MG  last filled 02/25/2021 for 90 day supply at [facility] (- receive through [employer]) Jardiance 10 mg - Not filled report   Medication Fill Gaps: None ID  Reviewed chart prior to disease state call. Spoke with patient regarding BP  Recent Office Vitals: BP Readings from Last 3 Encounters:  06/23/21 130/70  06/18/21 112/74  05/12/21 112/72   Pulse Readings from Last 3 Encounters:  06/23/21 70  06/18/21 67  05/12/21 70    Wt Readings from Last 3 Encounters:  06/23/21 225 lb (102.1 kg)  06/18/21 224 lb 14.4 oz (102 kg)  05/12/21 226 lb 9.6 oz (102.8 kg)     Kidney Function Lab Results  Component Value Date/Time   CREATININE 1.20 07/07/2021 10:10 AM   CREATININE 1.25 (H) 06/18/2021 10:52 AM   CREATININE 1.19 11/20/2020 12:12 PM   CREATININE 1.24 (H) 06/16/2020 10:13 AM   GFRNONAA >60 07/07/2021 10:10 AM   GFRNONAA 55 (L) 06/16/2020 10:13 AM   GFRAA 63 06/16/2020 10:13 AM       Latest Ref Rng & Units 07/07/2021   10:10 AM 06/18/2021   10:52 AM 11/20/2020   12:12 PM  BMP  Glucose 70 - 99 mg/dL 376  283  92   BUN 8 - 23  mg/dL 17  15  16    Creatinine 0.61 - 1.24 mg/dL  9.32  3.55   BUN/Creat Ratio 6 - 22 (calc)  12  13   Sodium 135 - 145 mmol/L 140  142  140   Potassium 3.5 - 5.1 mmol/L 4.1  4.5  4.2   Chloride 98 - 111 mmol/L 109  108  105   CO2 22 - 32 mmol/L 22  25  17    Calcium 8.9 - 10.3 mg/dL 9.9  9.9  9.8     Current antihypertensive regimen:  Amlodipine 5 mg daily  Carvedilol 3.125 mg twice daily  Losartan 25 mg daily   Patient reports his headaches has improved, but still have them from time to time.Patient states he will become dizzy sometimes when he stands up to quickly,but will go away within a minute or two.  How often are you checking your Blood Pressure? daily  Current home BP readings:  On 08/27/2021 it was 138/74. On 08/26/2021 it was 149/74. On 08/25/2021 it was  143/74. On 08/24/2021 it was 142/76. On 08/23/2021 it was 150/85. On 08/22/2021 it was 237/83 - patient states he just came inside from putting gas in his lawn mower and was working in his garden without resting before he check his  blood pressure.Patient reports he also ate some  watermelon with salt added earlier that day. On 08/21/2021 it was 134/97.  What recent interventions/DTPs have been made by any provider to improve Blood Pressure control since last CPP Visit: None ID   Any recent hospitalizations or ED visits since last visit with CPP? No  What diet changes have been made to improve Blood Pressure Control?  Patient denies any changes with his diet, and states he receives plenty of water through out the day.  What exercise is being done to improve your Blood Pressure Control?  Patient states he tries to be active in his garden depending on the weather.  Adherence Review: Is the patient currently on ACE/ARB medication? Yes Does the patient have >5 day gap between last estimated fill dates? Yes   10/23/2021 Clinical Pharmacist Assistant [phone]

## 2021-09-02 ENCOUNTER — Other Ambulatory Visit: Payer: Self-pay | Admitting: Internal Medicine

## 2021-09-05 ENCOUNTER — Other Ambulatory Visit: Payer: Self-pay | Admitting: Internal Medicine

## 2021-09-05 ENCOUNTER — Other Ambulatory Visit: Payer: Self-pay | Admitting: Family Medicine

## 2021-09-05 DIAGNOSIS — E034 Atrophy of thyroid (acquired): Secondary | ICD-10-CM

## 2021-09-20 ENCOUNTER — Other Ambulatory Visit: Payer: Self-pay | Admitting: Internal Medicine

## 2021-09-22 ENCOUNTER — Telehealth: Payer: Self-pay

## 2021-09-22 NOTE — Progress Notes (Signed)
Chronic Care Management APPOINTMENT REMINDER   Called [facility], No answer, left message of appointment on 09/23/2021 at 3:45 pm  via telephone visit with Angelena Sole , Pharm D. Notified to have all medications, supplements, blood pressure and/or blood sugar logs available during appointment and to return call if need to reschedule.    Everlean Cherry Clinical Pharmacist Assistant [phone]

## 2021-09-23 ENCOUNTER — Ambulatory Visit: Payer: Medicare Other

## 2021-09-23 DIAGNOSIS — I5032 Chronic diastolic (congestive) heart failure: Secondary | ICD-10-CM

## 2021-09-23 DIAGNOSIS — E1169 Type 2 diabetes mellitus with other specified complication: Secondary | ICD-10-CM

## 2021-09-23 NOTE — Progress Notes (Signed)
Chronic Care Management Pharmacy Note  09/28/2021 Name:  Drew [name] MRN:  [id number] DOB:  [birth year]  Summary: Patient presents for CCM follow-up  Recommendations/Changes made from today's visit: Continue current medications   Plan: CPP follow-up 6 months   Summary: Patient presents for CCM follow-up. He is doing well with his blood pressure recently, although his home readings continue to be borderline low.  Recommendations/Changes made from today's visit: Continue current medications  Plan: CPP follow-up in 6 months   Subjective: Drew [name] is an 81 y.o. year old male who is a primary patient of Alba Cory, MD.  The CCM team was consulted for assistance with disease management and care coordination needs.    Engaged with patient by telephone for follow up visit in response to provider referral for pharmacy case management and/or care coordination services.   Consent to Services:  The patient was given information about Chronic Care Management services, agreed to services, and gave verbal consent prior to initiation of services.  Please see initial visit note for detailed documentation.   Patient Care Team: Alba Cory, MD as PCP - General (Family Medicine) End, Cristal Deer, MD as PCP - Cardiology (Cardiology) Mosetta Pigeon, MD (Nephrology) Gaspar Cola, [facility] as Pharmacist (Pharmacist) Erin Fulling, MD as Consulting Physician (Pulmonary Disease) End, Cristal Deer, MD as Consulting Physician (Cardiology)  Recent office visits: 04/16/21: Patient presented to Reather Littler, LPN for AWV.  45/36/46: Patient presened to Dr. Carlynn Purl for follow-up. A1c 6.5%.   Recent consult visits: 07/27/21: Patient presented to Dr. Thedore Mins (Nephrology) for follow-up.  05/12/21: Patient presented to Ames Dura, NP (Pulmonology) for follow-up.   Hospital visits: None in previous 6 months   Objective:  Lab Results  Component Value Date   CREATININE 1.20 07/07/2021    BUN 17 07/07/2021   GFRNONAA >60 07/07/2021   GFRAA 63 06/16/2020   NA 140 07/07/2021   K 4.1 07/07/2021   CALCIUM 9.9 07/07/2021   CO2 22 07/07/2021   GLUCOSE 160 (H) 07/07/2021    Lab Results  Component Value Date/Time   HGBA1C 6.4 (H) 06/18/2021 10:52 AM   HGBA1C 6.5 (A) 02/17/2021 10:00 AM   HGBA1C 6.4 (A) 10/15/2020 11:03 AM   HGBA1C 6.2 (H) 05/04/2019 10:02 AM   MICROALBUR 9.3 06/18/2021 10:52 AM   MICROALBUR 2.2 06/16/2020 10:13 AM    Last diabetic Eye exam:  Lab Results  Component Value Date/Time   HMDIABEYEEXA No Retinopathy 06/25/2019 12:00 AM    Last diabetic Foot exam: No results found for: "HMDIABFOOTEX"   Lab Results  Component Value Date   CHOL 102 06/18/2021   HDL 32 (L) 06/18/2021   LDLCALC 43 06/18/2021   TRIG 197 (H) 06/18/2021   CHOLHDL 3.2 06/18/2021       Latest Ref Rng & Units 06/18/2021   10:52 AM 06/16/2020   10:13 AM 05/19/2020    9:29 AM  Hepatic Function  Total Protein 6.1 - 8.1 g/dL 7.0  6.5    AST 10 - 35 U/L 27  26    ALT 9 - 46 U/L 36  33  38   Total Bilirubin 0.2 - 1.2 mg/dL 0.9  1.0      Lab Results  Component Value Date/Time   TSH 2.19 06/18/2021 10:52 AM   TSH 2.520 11/20/2020 12:12 PM       Latest Ref Rng & Units 06/18/2021   10:52 AM 11/20/2020   12:12 PM 06/16/2020   10:13 AM  CBC  WBC  3.8 - 10.8 Thousand/uL 7.2  6.7  5.6   Hemoglobin 13.2 - 17.1 g/dL 15.0  14.3  14.9   Hematocrit 38.5 - 50.0 % 44.5  40.8  43.4   Platelets 140 - 400 Thousand/uL 152  161  118     Lab Results  Component Value Date/Time   VD25OH 39 06/18/2021 10:52 AM    Clinical ASCVD: Yes  The ASCVD Risk score (Arnett DK, et al., 2019) failed to calculate for the following reasons:   The 2019 ASCVD risk score is only valid for ages 82 to 66       06/18/2021   10:03 AM 04/16/2021    2:28 PM 02/17/2021    9:48 AM  Depression screen PHQ 2/9  Decreased Interest 0 0 0  Down, Depressed, Hopeless 0 0 0  PHQ - 2 Score 0 0 0  Altered sleeping 0 0  0  Tired, decreased energy 0 0 0  Change in appetite 3 0 0  Feeling bad or failure about yourself  0 0 0  Trouble concentrating 0 0 0  Moving slowly or fidgety/restless 0 0 0  Suicidal thoughts 0 0 0  PHQ-9 Score 3 0 0     Social History   Tobacco Use  Smoking Status Former   Years: 1.00   Types: Cigarettes, Cigars   Quit date: 1990   Years since quitting: 33.6  Smokeless Tobacco Never  Tobacco Comments   Smoked afternoon cigar   BP Readings from Last 3 Encounters:  06/23/21 130/70  06/18/21 112/74  05/12/21 112/72   Pulse Readings from Last 3 Encounters:  06/23/21 70  06/18/21 67  05/12/21 70   Wt Readings from Last 3 Encounters:  06/23/21 225 lb (102.1 kg)  06/18/21 224 lb 14.4 oz (102 kg)  05/12/21 226 lb 9.6 oz (102.8 kg)   BMI Readings from Last 3 Encounters:  06/23/21 34.21 kg/m  06/18/21 34.20 kg/m  05/12/21 34.45 kg/m    Assessment/Interventions: Review of patient past medical history, allergies, medications, health status, including review of consultants reports, laboratory and other test data, was performed as part of comprehensive evaluation and provision of chronic care management services.   SDOH:  (Social Determinants of Health) assessments and interventions performed: Yes     SDOH Screenings   Alcohol Screen: Low Risk  (04/16/2021)   Alcohol Screen    Last Alcohol Screening Score (AUDIT): 0  Depression (PHQ2-9): Low Risk  (06/18/2021)   Depression (PHQ2-9)    PHQ-2 Score: 3  Financial Resource Strain: High Risk (04/21/2021)   Overall Financial Resource Strain (CARDIA)    Difficulty of Paying Living Expenses: Hard  Food Insecurity: No Food Insecurity (04/16/2021)   Hunger Vital Sign    Worried About Running Out of Food in the Last Year: Never true    Ran Out of Food in the Last Year: Never true  Housing: Low Risk  (04/16/2021)   Housing    Last Housing Risk Score: 0  Physical Activity: Insufficiently Active (04/16/2021)   Exercise Vital  Sign    Days of Exercise per Week: 5 days    Minutes of Exercise per Session: 20 min  Social Connections: Moderately Integrated (04/16/2021)   Social Connection and Isolation Panel [NHANES]    Frequency of Communication with Friends and Family: More than three times a week    Frequency of Social Gatherings with Friends and Family: Twice a week    Attends Religious Services: More than 4 times per year  Active Member of Clubs or Organizations: No    Attends Banker Meetings: Never    Marital Status: Married  Stress: No Stress Concern Present (04/16/2021)   [employer] of Occupational Health - Occupational Stress Questionnaire    Feeling of Stress : Only a little  Tobacco Use: Medium Risk (06/23/2021)   Patient History    Smoking Tobacco Use: Former    Smokeless Tobacco Use: Never    Passive Exposure: Not on file  Transportation Needs: No Transportation Needs (04/16/2021)   PRAPARE - Transportation    Lack of Transportation (Medical): No    Lack of Transportation (Non-Medical): No    CCM Care Plan  Allergies  Allergen Reactions   Niacin And Related Itching and Rash    Medications Reviewed Today     Reviewed by Marianne Sofia, PA-C (Physician Assistant Certified) on 06/23/21 at 1334  Med List Status: <None>   Medication Order Taking? Sig Documenting Provider Last Dose Status Informant  acetaminophen (TYLENOL) 500 MG tablet [id number] Yes Take 500 mg by mouth every 6 (six) hours as needed. [provider] Taking Active Self  allopurinol (ZYLOPRIM) 100 MG tablet [id number] Yes Take 100 mg by mouth 2 (two) times daily. [provider] Taking Active Self           Med Note (NEWCOMER Lapel, BRANDY L   Wed Aug 13, 2020 10:19 AM)    amLODipine (NORVASC) 5 MG tablet [id number] Yes TAKE 1 TABLET BY MOUTH  DAILY End, Cristal Deer, MD Taking Active   aspirin EC 81 MG tablet [id number] Yes Take 81 mg by mouth daily. [provider] Taking Active  Self  carvedilol (COREG) 3.125 MG tablet [id number] Yes TAKE 1 TABLET BY MOUTH  TWICE DAILY End, Christopher, MD Taking Active   Cholecalciferol (VITAMIN D3) 25 MCG (1000 UT) CAPS [id number] Yes Take 1,000 Units by mouth daily. [provider] Taking Active Self  Coenzyme Q10 (CO Q 10) 100 MG CAPS [id number] Yes Take 100 mg by mouth daily. [provider] Taking Active Self  famotidine (PEPCID) 20 MG tablet [id number] Yes TAKE 1 TABLET BY MOUTH  DAILY Sowles, Danna Hefty, MD Taking Active   levothyroxine (SYNTHROID) 25 MCG tablet [id number] Yes TAKE 1 TABLET BY MOUTH IN  THE Unice Cobble, Danna Hefty, MD Taking Active   losartan (COZAAR) 25 MG tablet [id number] Yes TAKE 1 TABLET BY MOUTH  DAILY End, Christopher, MD Taking Active   Multiple Vitamins-Minerals (CENTRUM SILVER PO) [id number] Yes Take 200 mg by mouth daily. [provider] Taking Active Self  Needle, Disp, 32G X 5/16" MISC [id number] Yes 6 mm by Does not apply route 2 (two) times daily. Alba Cory, MD Taking Active   Omega-3 Fatty Acids (FISH OIL) 1000 MG CAPS [id number] Yes Take 1,000 mg by mouth daily. [provider] Taking Active Self  rosuvastatin (CRESTOR) 20 MG tablet [id number] Yes Take 1 tablet (20 mg total) by mouth daily. Alba Cory, MD Taking Active   triamcinolone cream (KENALOG) 0.1 % [id number] Yes Apply 1 application topically 2 (two) times daily. Alba Cory, MD Taking Active   Turmeric 500 MG CAPS [id number] Yes Take 500 mg by mouth daily. [provider] Taking Active Self  VICTOZA 18 MG/3ML SOPN [id number] Yes INJECT SUBCUTANEOUSLY 1.2  MG DAILY Margarita Mail, DO Taking Active             Patient Active Problem List   Diagnosis Date Noted   Insomnia 05/12/2021   Coronary artery disease of  native artery of native heart with stable angina pectoris ([place]) 08/14/2020   Aortic atherosclerosis ([place]) 08/14/2020   Atrophy of kidney 10/10/2019   Edema of lower extremity  10/10/2019   Thrombocytopenia ([place]) 08/13/2019   Diabetes mellitus type 2 in obese ([place]) 08/13/2019   Hyperlipidemia associated with type 2 diabetes mellitus ([place]) 08/13/2019   Neuropathy involving both lower extremities 05/04/2019   Polyneuropathy, unspecified 05/04/2019   Multiple renal cysts 04/09/2019   Stable angina ([place]) 12/29/2018   Onychomycosis of multiple toenails with type 2 diabetes mellitus ([place]) 11/30/2017   Chronic heart failure with preserved ejection fraction (HFpEF) ([place]) 10/20/2017   Obstructive sleep apnea 02/10/2017   Abnormal ankle brachial index (ABI) 11/26/2015   Stage 3a chronic kidney disease ([place]) 08/15/2015   H/O: upper GI bleed    Hypothyroidism due to acquired atrophy of thyroid 03/11/2015   History of prostatitis 11/06/2014   Essential hypertension 11/06/2014   Mixed hyperlipidemia 11/06/2014   Flat feet, bilateral 11/06/2014   Chronic multiple gastric erosions 11/06/2014   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 ([place]) 11/06/2014    Immunization History  Administered Date(s) Administered   Fluad Quad(high Dose 65+) 11/13/2019, 12/18/2020   Influenza, High Dose Seasonal PF 11/06/2014, 11/24/2015, 11/30/2016, 11/30/2017   Influenza-Unspecified 12/29/2018   PFIZER(Purple Top)SARS-COV-2 Vaccination 04/05/2019, 04/30/2019, 12/24/2019   Pneumococcal Conjugate-13 12/14/2013   Pneumococcal Polysaccharide-23 10/02/2009   Tdap 06/16/2020   Zoster Recombinat (Shingrix) 11/27/2020, 04/02/2021    Conditions to be addressed/monitored:  Hypertension, Hyperlipidemia, Diabetes, Heart Failure, Coronary Artery Disease, GERD, Chronic Kidney Disease and Gout  There are no care plans that you recently modified to display for this patient.  Medication Assistance:  Victoza obtained through [name] medication assistance program.  Enrollment ends Dec 2023  Compliance/Adherence/Medication fill history: Care Gaps: Ophthalmology  Exam  Star-Rating Drugs: No fill history available  Patient's preferred pharmacy is:  [facility] [place] [address] [address] Phone: [phone] Fax: [phone]  [place] [address], [place] [address] Phone: [phone] Fax: [phone]  [facility] ([facility] ) - [place], [address] [address] Phone: [phone] Fax: [phone]   Uses pill box? Yes Pt endorses 100% compliance  We discussed: Current pharmacy is preferred with insurance plan and patient is satisfied with pharmacy services Patient decided to: Continue current medication management strategy  Care Plan and Follow Up Patient Decision:  Patient agrees to Care Plan and Follow-up.  Plan: No further follow up required: Patient will reach out to clinical team as needed.  Malva Limes, [place] Pharmacist Practitioner  [facility] [phone]

## 2021-10-16 NOTE — Progress Notes (Unsigned)
Name: Drew [name]   MRN: [id number]    DOB: 05/18/1940   Date:10/19/2021       Progress Note  Subjective  Chief Complaint  Follow Up  HPI  DMII: he has obesity, dyslipidemia, HTN and CKI. His weight is going down, but BMI is now below 35, A1C had  gone from 6.2 % to 6.9 %, 6.3% , 6.4% , 6.5 %, 6.4 % last visit and today is 6 %   He is still taking Victoza daily and denies hypoglycemic episodes glucose,  he states glucose has been controlled.  Denies polyphagia, polyuria or polydipsia.  He is on ARB and statin therapy. He denies side effects of medication . He has lower extremity neuropathy. He is due for an eye exam  OSA: he is complaint with CPAP , he had a leak from his CPAP , changed his mask and has not been sleeping well, advised to contact them back to see if another mask would be better, he states he would like something to help him to stay asleep  HTN/CHF : bp is towards low end of normal today, he denies dizziness. They brought their medications and seems like he is taking two tablets of Losartan ( two different colors ) and maybe two of norvasc. Advised to keep bottles separated and use a M-Sunday pill box. He has intermittent chest pain, on ARB and beta blocker and norvasc.Marland Kitchen He sees Dr. Okey Dupre - cardiologist , no orthopnea, wheezing but he has intermittent sob with moderate activity that has been stable . He has intermittent lower extremity edema . He is on beta-blocker and ARB, takes aspirin daily .  Dyslipidemia: he is doing well on medication, last LDL was down to 44. He is under the care of Dr. Okey Dupre  and is compliant with medications  Stable Angina/Atherosclerosis aorta : he has intermittent chest pain but intermittent only,  under the care of Dr. Okey Dupre he does not have NTG at home. He states he has been doing well lately, no recent episodes of chest pain,  taking higher dose of Crestor .   Obesity  BMI is now below  35 , continue regular physical activity , he continues to like sweets  but stopped eating bread   Controlled gout: on allopurinol , no recent episodes, uric acid below 6. Continue medications   CKI stage III: on ARB, sees Dr. Thedore Mins, no pruritus, good urine output, last GFR improved , BP towards low end of normal but he only has orthostatic symptoms occasionally, mostly when outside in the heat   Hypothyroidism: on the same dose of levothyroxine , denies dysphagia, dry skin or change in bowel movements, last TSH at goal    Patient Active Problem List   Diagnosis Date Noted   Insomnia 05/12/2021   Coronary artery disease of native artery of native heart with stable angina pectoris (HCC) 08/14/2020   Aortic atherosclerosis (HCC) 08/14/2020   Atrophy of kidney 10/10/2019   Edema of lower extremity 10/10/2019   Thrombocytopenia (HCC) 08/13/2019   Diabetes mellitus type 2 in obese (HCC) 08/13/2019   Hyperlipidemia associated with type 2 diabetes mellitus (HCC) 08/13/2019   Neuropathy involving both lower extremities 05/04/2019   Polyneuropathy, unspecified 05/04/2019   Multiple renal cysts 04/09/2019   Stable angina (HCC) 12/29/2018   Onychomycosis of multiple toenails with type 2 diabetes mellitus (HCC) 11/30/2017   Chronic heart failure with preserved ejection fraction (HFpEF) (HCC) 10/20/2017   Obstructive sleep apnea 02/10/2017   Abnormal ankle  brachial index (ABI) 11/26/2015   Stage 3a chronic kidney disease (HCC) 08/15/2015   H/O: upper GI bleed    Hypothyroidism due to acquired atrophy of thyroid 03/11/2015   History of prostatitis 11/06/2014   Essential hypertension 11/06/2014   Mixed hyperlipidemia 11/06/2014   Flat feet, bilateral 11/06/2014   Chronic multiple gastric erosions 11/06/2014   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) 11/06/2014    Past Surgical History:  Procedure Laterality Date   CATARACT EXTRACTION, BILATERAL Bilateral 11/18/2017   COLONOSCOPY N/A 12/27/2013   Procedure: COLONOSCOPY;  Surgeon:  Barrie FolkJohn C Hayes, MD;  [facility];  Service: Endoscopy;  Laterality: N/A;   ENTEROSCOPY N/A 12/21/2013   Procedure: ENTEROSCOPY;  Surgeon: Vertell NovakJames L Edwards Jr., MD;  [facility];  Service: Endoscopy;  Laterality: N/A;   ENTEROSCOPY N/A 12/27/2013   Procedure: ENTEROSCOPY;  Surgeon: Barrie FolkJohn C Hayes, MD;  [facility];  Service: Endoscopy;  Laterality: N/A;    Family History  Problem Relation Age of Onset   Hyperlipidemia Father    Kidney disease Father    Heart disease Father    Congestive Heart Failure Mother    Heart disease Mother    Hyperlipidemia Daughter    Hyperlipidemia Son    Diabetes Son    Anxiety disorder Daughter    Depression Daughter    Arthritis Daughter     Social History   Tobacco Use   Smoking status: Former    Years: 1.00    Types: Cigarettes, Cigars    Quit date: 1990    Years since quitting: 33.6   Smokeless tobacco: Never   Tobacco comments:    Smoked afternoon cigar  Substance Use Topics   Alcohol use: No     Current Outpatient Medications:    acetaminophen (TYLENOL) 500 MG tablet, Take 500 mg by mouth every 6 (six) hours as needed., Disp: , Rfl:    allopurinol (ZYLOPRIM) 100 MG tablet, Take 100 mg by mouth 2 (two) times daily., Disp: , Rfl:    amLODipine (NORVASC) 5 MG tablet, TAKE 1 TABLET BY MOUTH  DAILY, Disp: 90 tablet, Rfl: 0   aspirin EC 81 MG tablet, Take 81 mg by mouth daily., Disp: , Rfl:    carvedilol (COREG) 3.125 MG tablet, TAKE 1 TABLET BY MOUTH TWICE  DAILY, Disp: 180 tablet, Rfl: 0   Cholecalciferol (VITAMIN D3) 25 MCG (1000 UT) CAPS, Take 1,000 Units by mouth daily., Disp: , Rfl:    Coenzyme Q10 (CO Q 10) 100 MG CAPS, Take 100 mg by mouth daily., Disp: , Rfl:    famotidine (PEPCID) 20 MG tablet, TAKE 1 TABLET BY MOUTH  DAILY, Disp: 100 tablet, Rfl: 1   levothyroxine (SYNTHROID) 25 MCG tablet, TAKE 1 TABLET BY MOUTH IN THE  MORNING, Disp: 90 tablet, Rfl: 3   losartan (COZAAR) 25 MG tablet, TAKE 1 TABLET BY  MOUTH DAILY, Disp: 90 tablet, Rfl: 2   Multiple Vitamins-Minerals (CENTRUM SILVER PO), Take 200 mg by mouth daily., Disp: , Rfl:    Needle, Disp, 32G X 5/16" MISC, 6 mm by Does not apply route 2 (two) times daily., Disp: 100 each, Rfl: 3   Omega-3 Fatty Acids (FISH OIL) 1000 MG CAPS, Take 1,000 mg by mouth daily., Disp: , Rfl:    rosuvastatin (CRESTOR) 20 MG tablet, Take 1 tablet (20 mg total) by mouth daily., Disp: 90 tablet, Rfl: 1   triamcinolone cream (KENALOG) 0.1 %, Apply 1 application topically 2 (two)  times daily., Disp: 30 g, Rfl: 0   Turmeric 500 MG CAPS, Take 500 mg by mouth daily., Disp: , Rfl:    VICTOZA 18 MG/3ML SOPN, INJECT SUBCUTANEOUSLY 1.2  MG DAILY, Disp: 18 mL, Rfl: 3  Allergies  Allergen Reactions   Niacin And Related Itching and Rash    I personally reviewed active problem list, medication list, allergies, family history, social history, health maintenance with the patient/caregiver today.   ROS  Constitutional: Negative for fever or weight change.  Respiratory: Negative for cough , positive for  shortness of breath.   Cardiovascular: Negative for chest pain or palpitations.  Gastrointestinal: Negative for abdominal pain, no bowel changes.  Musculoskeletal: Negative for gait problem or joint swelling.  Skin: Negative for rash.  Neurological: Negative for dizziness or headache.  No other specific complaints in a complete review of systems (except as listed in HPI above).   Objective  Vitals:   10/19/21 0945  BP: 122/68  Pulse: 75  Resp: 16  SpO2: 93%  Weight: 223 lb (101.2 kg)  Height: 5\' 8"  (1.727 m)    Body mass index is 33.91 kg/m.  Physical Exam  Constitutional: Patient appears well-developed and well-nourished. Obese  No distress.  HEENT: head atraumatic, normocephalic, pupils equal and reactive to light, neck supple Cardiovascular: Normal rate, regular rhythm and normal heart sounds.  No murmur heard. Trace  BLE edema. Pulmonary/Chest:  Effort normal and breath sounds normal. No respiratory distress. Abdominal: Soft.  There is no tenderness. Psychiatric: Patient has a normal mood and affect. behavior is normal. Judgment and thought content normal.   Recent Results (from the past 2160 hour(s))  POCT HgB A1C     Status: Abnormal   Collection Time: 10/19/21  9:46 AM  Result Value Ref Range   Hemoglobin A1C 6.0 (A) 4.0 - 5.6 %   HbA1c POC (<> result, manual entry)     HbA1c, POC (prediabetic range)     HbA1c, POC (controlled diabetic range)       PHQ2/9:    10/19/2021    9:46 AM 06/18/2021   10:03 AM 04/16/2021    2:28 PM 02/17/2021    9:48 AM 10/15/2020   10:13 AM  Depression screen PHQ 2/9  Decreased Interest 0 0 0 0 0  Down, Depressed, Hopeless 0 0 0 0 0  PHQ - 2 Score 0 0 0 0 0  Altered sleeping 0 0 0 0   Tired, decreased energy 0 0 0 0   Change in appetite 0 3 0 0   Feeling bad or failure about yourself  0 0 0 0   Trouble concentrating 0 0 0 0   Moving slowly or fidgety/restless 0 0 0 0   Suicidal thoughts 0 0 0 0   PHQ-9 Score 0 3 0 0     phq 9 is negative   Fall Risk:    10/19/2021    9:45 AM 06/18/2021   10:02 AM 04/16/2021    2:33 PM 02/17/2021    9:48 AM 10/15/2020   10:13 AM  Fall Risk   Falls in the past year? 1 0 1 1 1   Number falls in past yr: 0  1 1 1   Injury with Fall? 0  0 0 0  Risk for fall due to : No Fall Risks No Fall Risks No Fall Risks No Fall Risks   Follow up Falls prevention discussed Falls prevention discussed;Education provided Falls prevention discussed Falls prevention discussed Falls prevention discussed  Functional Status Survey: Is the patient deaf or have difficulty hearing?: No Does the patient have difficulty seeing, even when wearing glasses/contacts?: No Does the patient have difficulty concentrating, remembering, or making decisions?: No Does the patient have difficulty walking or climbing stairs?: No Does the patient have difficulty dressing or bathing?:  No Does the patient have difficulty doing errands alone such as visiting a doctor's office or shopping?: No    Assessment & Plan  1. Type 2 diabetes mellitus with stage 3a chronic kidney disease, without long-term current use of insulin (HCC)  - POCT HgB A1C  2. Stable angina (HCC)  - rosuvastatin (CRESTOR) 20 MG tablet; Take 1 tablet (20 mg total) by mouth daily.  Dispense: 90 tablet; Refill: 1  3. Aortic atherosclerosis (HCC)  - rosuvastatin (CRESTOR) 20 MG tablet; Take 1 tablet (20 mg total) by mouth daily.  Dispense: 90 tablet; Refill: 1  4. Other insomnia  We will add trazodone   5. OSA on CPAP  Compliant   6. Diabetic polyneuropathy associated with type 2 diabetes mellitus ([place])   7. Chronic heart failure with preserved ejection fraction (HCC)   8. Hypertension associated with type 2 diabetes mellitus ([place])   9. Controlled gout   10. Hypothyroidism due to acquired atrophy of thyroid   11. Stage 3a chronic kidney disease ([place])

## 2021-10-19 ENCOUNTER — Ambulatory Visit (INDEPENDENT_AMBULATORY_CARE_PROVIDER_SITE_OTHER): Payer: Medicare Other | Admitting: Family Medicine

## 2021-10-19 ENCOUNTER — Encounter: Payer: Self-pay | Admitting: Family Medicine

## 2021-10-19 ENCOUNTER — Ambulatory Visit: Payer: Medicare Other | Admitting: Family Medicine

## 2021-10-19 VITALS — BP 122/68 | HR 75 | Resp 16 | Ht 68.0 in | Wt 223.0 lb

## 2021-10-19 DIAGNOSIS — N1831 Chronic kidney disease, stage 3a: Secondary | ICD-10-CM

## 2021-10-19 DIAGNOSIS — E1122 Type 2 diabetes mellitus with diabetic chronic kidney disease: Secondary | ICD-10-CM

## 2021-10-19 DIAGNOSIS — E1159 Type 2 diabetes mellitus with other circulatory complications: Secondary | ICD-10-CM

## 2021-10-19 DIAGNOSIS — I700 Atherosclerosis of aorta: Secondary | ICD-10-CM

## 2021-10-19 DIAGNOSIS — G4733 Obstructive sleep apnea (adult) (pediatric): Secondary | ICD-10-CM

## 2021-10-19 DIAGNOSIS — G4709 Other insomnia: Secondary | ICD-10-CM

## 2021-10-19 DIAGNOSIS — E034 Atrophy of thyroid (acquired): Secondary | ICD-10-CM

## 2021-10-19 DIAGNOSIS — I208 Other forms of angina pectoris: Secondary | ICD-10-CM | POA: Diagnosis not present

## 2021-10-19 DIAGNOSIS — E1169 Type 2 diabetes mellitus with other specified complication: Secondary | ICD-10-CM

## 2021-10-19 DIAGNOSIS — Z9989 Dependence on other enabling machines and devices: Secondary | ICD-10-CM

## 2021-10-19 DIAGNOSIS — E1142 Type 2 diabetes mellitus with diabetic polyneuropathy: Secondary | ICD-10-CM

## 2021-10-19 DIAGNOSIS — I152 Hypertension secondary to endocrine disorders: Secondary | ICD-10-CM

## 2021-10-19 DIAGNOSIS — M109 Gout, unspecified: Secondary | ICD-10-CM

## 2021-10-19 DIAGNOSIS — I5032 Chronic diastolic (congestive) heart failure: Secondary | ICD-10-CM

## 2021-10-19 LAB — POCT GLYCOSYLATED HEMOGLOBIN (HGB A1C): Hemoglobin A1C: 6 % — AB (ref 4.0–5.6)

## 2021-10-19 MED ORDER — TRAZODONE HCL 50 MG PO TABS: 25.0000 mg | ORAL_TABLET | Freq: Every evening | ORAL | 0 refills | Status: DC | PRN

## 2021-10-19 MED ORDER — ROSUVASTATIN CALCIUM 20 MG PO TABS: 20.0000 mg | ORAL_TABLET | Freq: Every day | ORAL | 1 refills | Status: DC

## 2021-11-24 ENCOUNTER — Telehealth: Payer: Self-pay

## 2021-11-24 NOTE — Progress Notes (Signed)
    Chronic Care Management Pharmacy Assistant   Name: TAISON CELANI  MRN: [id number] DOB: Jul 22, 1940  Reason for Encounter: Medication Review/Patient assistance renewal for Victoza for year 2024.   Recent office visits:  10/19/2021 Dr. Ancil Boozer MD (PCP) Start Trazdone 25-50 mg PRN,Return in about 6 months   Recent consult visits:  None ID  Hospital visits:  None in previous 6 months  Medications: Outpatient Encounter Medications as of 11/24/2021  Medication Sig   acetaminophen (TYLENOL) 500 MG tablet Take 500 mg by mouth every 6 (six) hours as needed.   allopurinol (ZYLOPRIM) 100 MG tablet Take 100 mg by mouth 2 (two) times daily.   amLODipine (NORVASC) 5 MG tablet TAKE 1 TABLET BY MOUTH  DAILY   aspirin EC 81 MG tablet Take 81 mg by mouth daily.   carvedilol (COREG) 3.125 MG tablet TAKE 1 TABLET BY MOUTH TWICE  DAILY   Cholecalciferol (VITAMIN D3) 25 MCG (1000 UT) CAPS Take 1,000 Units by mouth daily.   Coenzyme Q10 (CO Q 10) 100 MG CAPS Take 100 mg by mouth daily.   famotidine (PEPCID) 20 MG tablet TAKE 1 TABLET BY MOUTH  DAILY   levothyroxine (SYNTHROID) 25 MCG tablet TAKE 1 TABLET BY MOUTH IN THE  MORNING   losartan (COZAAR) 25 MG tablet TAKE 1 TABLET BY MOUTH DAILY   Multiple Vitamins-Minerals (CENTRUM SILVER PO) Take 200 mg by mouth daily.   Needle, Disp, 32G X 5/16" MISC 6 mm by Does not apply route 2 (two) times daily.   Omega-3 Fatty Acids (FISH OIL) 1000 MG CAPS Take 1,000 mg by mouth daily.   rosuvastatin (CRESTOR) 20 MG tablet Take 1 tablet (20 mg total) by mouth daily.   traZODone (DESYREL) 50 MG tablet Take 0.5-1 tablets (25-50 mg total) by mouth at bedtime as needed for sleep.   triamcinolone cream (KENALOG) 0.1 % Apply 1 application topically 2 (two) times daily.   Turmeric 500 MG CAPS Take 500 mg by mouth daily.   VICTOZA 18 MG/3ML SOPN INJECT SUBCUTANEOUSLY 1.2  MG DAILY   No facility-administered encounter medications on file as of 11/24/2021.   Care  Gaps: COVID-19 Vaccnie Ophthalmology Exam Influenza Vaccine   Star Rating Drugs: Rosuvastatin 20 mg  last filled 09/03/2021 for 100 day supply at [facility] 25 mg last filled 10/13/2021 for 100 day supply at [facility] 18 MG  last filled 02/25/2021 for 90 day supply at [place] (- receive through [name])    Medication Fill Gaps: None ID  Patient assistance renewal for Victoza: I received a task from [name], CPP requesting that I start the renewal application for patient assistance on the medication Victoza to continue assistance through year 2024.    Unable to speak  with the patient to inform him that the application will be mailed.Once he receives the application he will need to complete his part of the application and return it to his PCP office for [name], CPP to fax over to [employer] for processing. Patient should include a copy of his proof of income.     Application emailed to [name], CPP for review and to mail to patient home.  [place] Pharmacist Assistant [phone]

## 2021-11-27 ENCOUNTER — Telehealth: Payer: Self-pay | Admitting: Family Medicine

## 2021-11-27 NOTE — Telephone Encounter (Signed)
Iona Beard calling from [employer] is calling to verify if forms Prior Authorization for [employer] were received via fax [id number] Requesting last ov notes as well.

## 2021-11-27 NOTE — Telephone Encounter (Signed)
Form filled out this morning and chart notes attached. Faxed to [employer].

## 2021-11-28 ENCOUNTER — Other Ambulatory Visit: Payer: Self-pay | Admitting: Internal Medicine

## 2021-11-30 NOTE — Telephone Encounter (Signed)
Faxed x2. Miel Mock called and confirmed through [place] they did receive the fax.

## 2021-11-30 NOTE — Telephone Encounter (Signed)
Caller states they did no receive forms and chart notes  Caller requesting forms to be re-faxed  Fax [phone]

## 2021-12-01 ENCOUNTER — Other Ambulatory Visit: Payer: Self-pay | Admitting: Family Medicine

## 2021-12-01 DIAGNOSIS — Z8711 Personal history of peptic ulcer disease: Secondary | ICD-10-CM

## 2021-12-01 DIAGNOSIS — G4709 Other insomnia: Secondary | ICD-10-CM

## 2021-12-07 ENCOUNTER — Ambulatory Visit (INDEPENDENT_AMBULATORY_CARE_PROVIDER_SITE_OTHER): Payer: Medicare Other

## 2021-12-07 DIAGNOSIS — Z23 Encounter for immunization: Secondary | ICD-10-CM | POA: Diagnosis not present

## 2021-12-23 ENCOUNTER — Encounter: Payer: Self-pay | Admitting: Medical

## 2021-12-23 ENCOUNTER — Ambulatory Visit: Payer: Medicare Other | Attending: Medical | Admitting: Medical

## 2021-12-23 VITALS — BP 126/70 | HR 57 | Ht 68.0 in | Wt 222.4 lb

## 2021-12-23 DIAGNOSIS — E782 Mixed hyperlipidemia: Secondary | ICD-10-CM | POA: Diagnosis not present

## 2021-12-23 DIAGNOSIS — I5032 Chronic diastolic (congestive) heart failure: Secondary | ICD-10-CM | POA: Diagnosis not present

## 2021-12-23 DIAGNOSIS — I25118 Atherosclerotic heart disease of native coronary artery with other forms of angina pectoris: Secondary | ICD-10-CM

## 2021-12-23 DIAGNOSIS — G4733 Obstructive sleep apnea (adult) (pediatric): Secondary | ICD-10-CM

## 2021-12-23 DIAGNOSIS — I10 Essential (primary) hypertension: Secondary | ICD-10-CM

## 2021-12-23 MED ORDER — NITROGLYCERIN 0.4 MG SL SUBL: 0.4000 mg | SUBLINGUAL_TABLET | SUBLINGUAL | 0 refills | Status: DC | PRN

## 2021-12-23 NOTE — Progress Notes (Signed)
Cardiology Office Note:    Date:  12/23/2021   ID:  Drew [name], DOB [birth year], MRN [id number]  PCP:  Steele Sizer, MD  [facility] Cardiologist:  Nelva Bush, MD  [facility] Electrophysiologist:  None   Referring MD: Steele Sizer, MD   Chief Complaint: 6 month follow-up  History of Present Illness:    Drew [name] is a 81 y.o. male with a hx of  HFpEF, OSA on CPAP, nonobstructive CAD, HTN, HLD, CKD stage 3, GERD, obesity for hospital follow-up.    He had prior coronary CTA 2021 showing extensive coronary artery calcification with moderate multivessel disease that was not significant by FFR.    Seen 11/20/20 and reported SOB and chest pain. Myoveiw and echo were ordered. Myoview was overall low risk with no evidence of ischemia, coronary artery calcifications noted in the LAD and RCA. Echo showed LVEF 55-60%, no WMA, G1DD, mild MR, mild AI.  Last seen 06/23/21 and reported exertional shortness of breath suspected mainly from deconditioning.  He reported rare chest pain.  Also reported occasional lower leg edema, on amlodipine.  Overall no changes were made.  Today, the patient reported he is overall doing Ok. He tried to make a garden, but this didn't workout. The patient reports occasional chest pain on exertion. May occur once a month. Denies shortness of breath. He has occasional dependent lower leg edema.   Past Medical History:  Diagnosis Date   (HFpEF) heart failure with preserved ejection fraction ([place])    a. 02/2016 Echo: EF nl. Gr1 DD. Mild AI. Mod thickened AoV w/o stenosis. Nl RV size and fxn.   Aortic atherosclerosis (HCC)    CAD (coronary artery disease)    a. 12/2019 Cor CTA: LM nl, LAD 25-49p/m, LCX small, nondom, nl, RCA large, dom, 50-69p (nl FFR), 25-44m, Ca2+ = 1602 (79th%'ile)-->Med rx.   Chronic kidney disease, stage III (moderate) ([place]) 08/15/2015   Diabetes mellitus with complication (HCC)    GERD (gastroesophageal reflux disease)    H/O:  upper GI bleed 2015   Hiatal hernia    a. 12/2019 Moderate-sized HH incidentally noted on Cor CTA.   History of stress test    a. 05/2016 Lexiscan MV: EF 72%, No ischemia-->Low risk.   Hypercholesteremia    Hypertension    Obesity (BMI 30-39.9) 08/07/2015   Peripheral vascular disease ([place]) 12/26/2015    Past Surgical History:  Procedure Laterality Date   CATARACT EXTRACTION, BILATERAL Bilateral 11/18/2017   COLONOSCOPY N/A 12/27/2013   Procedure: COLONOSCOPY;  Surgeon: Missy Sabins, MD;  Location: [facility];  Service: Endoscopy;  Laterality: N/A;   ENTEROSCOPY N/A 12/21/2013   Procedure: ENTEROSCOPY;  Surgeon: Winfield Cunas., MD;  Location[facility] ENDOSCOPY;  Service: Endoscopy;  Laterality: N/A;   ENTEROSCOPY N/A 12/27/2013   Procedure: ENTEROSCOPY;  Surgeon: Missy Sabins, MD;  Location: [facility] ENDOSCOPY;  Service: Endoscopy;  Laterality: N/A;    Current Medications: Current Meds  Medication Sig   acetaminophen (TYLENOL) 500 MG tablet Take 500 mg by mouth every 6 (six) hours as needed.   allopurinol (ZYLOPRIM) 100 MG tablet Take 100 mg by mouth 2 (two) times daily.   amLODipine (NORVASC) 5 MG tablet TAKE 1 TABLET BY MOUTH DAILY   aspirin EC 81 MG tablet Take 81 mg by mouth daily.   carvedilol (COREG) 3.125 MG tablet TAKE 1 TABLET BY MOUTH TWICE  DAILY   Cholecalciferol (VITAMIN D3) 25 MCG (1000 UT) CAPS Take 1,000 Units by mouth  daily.   Coenzyme Q10 (CO Q 10) 100 MG CAPS Take 100 mg by mouth daily.   famotidine (PEPCID) 20 MG tablet TAKE 1 TABLET BY MOUTH DAILY   levothyroxine (SYNTHROID) 25 MCG tablet TAKE 1 TABLET BY MOUTH IN THE  MORNING   losartan (COZAAR) 25 MG tablet TAKE 1 TABLET BY MOUTH DAILY   Multiple Vitamins-Minerals (CENTRUM SILVER PO) Take 200 mg by mouth daily.   Needle, Disp, 32G X 5/16" MISC 6 mm by Does not apply route 2 (two) times daily.   nitroGLYCERIN (NITROSTAT) 0.4 MG SL tablet Place 1 tablet (0.4 mg total) under the tongue every 5 (five) minutes as needed  for chest pain.   Omega-3 Fatty Acids (FISH OIL) 1000 MG CAPS Take 1,000 mg by mouth daily.   rosuvastatin (CRESTOR) 20 MG tablet Take 1 tablet (20 mg total) by mouth daily.   traZODone (DESYREL) 50 MG tablet TAKE 1/2 TO 1 TABLET BY MOUTH AT BEDTIME AS NEEDED FOR SLEEP   triamcinolone cream (KENALOG) 0.1 % Apply 1 application topically 2 (two) times daily.   Turmeric 500 MG CAPS Take 500 mg by mouth daily.   VICTOZA 18 MG/3ML SOPN INJECT SUBCUTANEOUSLY 1.2  MG DAILY     Allergies:   Niacin and related   Social History   Socioeconomic History   Marital status: Married    Spouse name: Not on file   Number of children: 4   Years of education: Not on file   Highest education level: High school graduate  Occupational History   Occupation: retired  Tobacco Use   Smoking status: Former    Years: 1.00    Types: Cigarettes, Cigars    Quit date: 1990    Years since quitting: 33.8   Smokeless tobacco: Never   Tobacco comments:    Smoked afternoon cigar  Vaping Use   Vaping Use: Never used  Substance and Sexual Activity   Alcohol use: No   Drug use: No   Sexual activity: Yes  Other Topics Concern   Not on file  Social History Narrative   Not on file   Social Determinants of Health   Financial Resource Strain: High Risk (04/21/2021)   Overall Financial Resource Strain (CARDIA)    Difficulty of Paying Living Expenses: Hard  Food Insecurity: No Food Insecurity (04/16/2021)   Hunger Vital Sign    Worried About Running Out of Food in the Last Year: Never true    Ran Out of Food in the Last Year: Never true  Transportation Needs: No Transportation Needs (04/16/2021)   PRAPARE - Administrator, Civil Service (Medical): No    Lack of Transportation (Non-Medical): No  Physical Activity: Insufficiently Active (04/16/2021)   Exercise Vital Sign    Days of Exercise per Week: 5 days    Minutes of Exercise per Session: 20 min  Stress: No Stress Concern Present (04/16/2021)    [employer] of Occupational Health - Occupational Stress Questionnaire    Feeling of Stress : Only a little  Social Connections: Moderately Integrated (04/16/2021)   Social Connection and Isolation Panel [NHANES]    Frequency of Communication with Friends and Family: More than three times a week    Frequency of Social Gatherings with Friends and Family: Twice a week    Attends Religious Services: More than 4 times per year    Active Member of [employer] or Organizations: No    Attends Banker Meetings: Never    Marital  Status: Married     Family History: The patient's family history includes Anxiety disorder in his daughter; Arthritis in his daughter; Congestive Heart Failure in his mother; Depression in his daughter; Diabetes in his son; Heart disease in his father and mother; Hyperlipidemia in his daughter, father, and son; Kidney disease in his father.  ROS:   Please see the history of present illness.     All other systems reviewed and are negative.  EKGs/Labs/Other Studies Reviewed:    The following studies were reviewed today:  Echo 12/2020  1. Left ventricular ejection fraction, by estimation, is 55 to 60%. The  left ventricle has normal function. The left ventricle has no regional  wall motion abnormalities. Left ventricular diastolic parameters are  consistent with Grade I diastolic  dysfunction (impaired relaxation).   2. Right ventricular systolic function is normal. The right ventricular  size is normal. There is normal pulmonary artery systolic pressure. The  estimated right ventricular systolic pressure is 24.9 mmHg.   3. The mitral valve is normal in structure. Mild mitral valve  regurgitation. No evidence of mitral stenosis.   4. The aortic valve was not well visualized. Aortic valve regurgitation  is mild. Mild to moderate aortic valve sclerosis/calcification is present,  without any evidence of aortic stenosis.   5. The inferior vena cava is normal  in size with greater than 50%  respiratory variability, suggesting right atrial pressure of 3 mmHg.   Comparison(s): LVEF 55-60%.    Myoview Lexiscan 11/2020   . The study is low risk.   There is no evidence for ischemia.   LV perfusion is normal. There is no evidence of ischemia.   The left ventricular ejection fraction is normal (55-65%).   Coronary artery calcifications noted in the LAD and RCA  EKG:  EKG is ordered today.  The ekg ordered today demonstrates sinus bradycardia, 57 bpm, LAD, nonspecific T wave changes.  Recent Labs: 06/18/2021: ALT 36; Hemoglobin 15.0; Platelets 152; TSH 2.19 07/07/2021: BUN 17; Creatinine, Ser 1.20; Potassium 4.1; Sodium 140  Recent Lipid Panel    Component Value Date/Time   CHOL 102 06/18/2021 1052   TRIG 197 (H) 06/18/2021 1052   HDL 32 (L) 06/18/2021 1052   CHOLHDL 3.2 06/18/2021 1052   VLDL 41 (H) 05/19/2020 0929   LDLCALC 43 06/18/2021 1052   Physical Exam:    VS:  BP 126/70 (BP Location: Left Arm, Patient Position: Sitting, Cuff Size: Normal)   Pulse (!) 57   Ht 5\' 8"  (1.727 m)   Wt 222 lb 6.4 oz (100.9 kg)   SpO2 96%   BMI 33.82 kg/m     Wt Readings from Last 3 Encounters:  12/23/21 222 lb 6.4 oz (100.9 kg)  10/19/21 223 lb (101.2 kg)  06/23/21 225 lb (102.1 kg)     GEN:  Well nourished, well developed in no acute distress HEENT: Normal NECK: No JVD; No carotid bruits LYMPHATICS: No lymphadenopathy CARDIAC: RRR, no murmurs, rubs, gallops RESPIRATORY:  Clear to auscultation without rales, wheezing or rhonchi  ABDOMEN: Soft, non-tender, non-distended MUSCULOSKELETAL:  No edema; No deformity  SKIN: Warm and dry NEUROLOGIC:  Alert and oriented x 3 PSYCHIATRIC:  Normal affect   ASSESSMENT:    1. Coronary artery disease of native artery of native heart with stable angina pectoris (HCC)   2. Mixed hyperlipidemia   3. Chronic diastolic heart failure (HCC)   4. Essential hypertension   5. Hyperlipidemia, mixed   6. OSA  (obstructive sleep  apnea)    PLAN:    In order of problems listed above:  Moderate CAD with stable angina Patient reports occasional chest pain with exertion.  This may occur once a month and does not last long at all.  No associated symptoms.  Patient has known moderate CAD by cardiac CTA in 12/2019 with negative FFR.  Subsequent Myoview was unremarkable. EKG today is unchanged. Continue aspirin 81 mg daily, amlodipine 5 mg daily, Coreg 3.125 mg twice a day, losartan 25 mg daily, Crestor.  I will give him 25 tablets sublingual nitroglycerin.  No further work-up at this time.  The patient was instructed to call if the pain worsens or becomes more frequent.  HFpEF Patient is euvolemic on exam. He reports occasional dependent lower leg edema. He is on amlodipine 5 mg. Continue Coreg and losartan.  HTN Blood pressure is good today, continue current medications.  HLD LDL 43, triglycerides 197, HDL 32, total cholesterol 616.  Continue Crestor 20 mg daily.  OSA He is using his CPAP.   Disposition: Follow up in 6 month(s) with MD/APP    Signed, Zayne Marovich David Stall, PA-C  12/23/2021 2:18 PM    [facility]

## 2021-12-23 NOTE — Patient Instructions (Addendum)
Medication Instructions:   None  *If you need a refill on your cardiac medications before your next appointment, please call your pharmacy*   Lab Work: None  If you have labs (blood work) drawn today and your tests are completely normal, you will receive your results only by: [place] (if you have MyChart) OR A paper copy in the mail If you have any lab test that is abnormal or we need to change your treatment, we will call you to review the results.   Testing/Procedures: NONE   Follow-Up: At [facility], you and your health needs are our priority.  As part of our continuing mission to provide you with exceptional heart care, we have created designated Provider Care Teams.  These Care Teams include your primary Cardiologist (physician) and Advanced Practice Providers (APPs -  Physician Assistants and Nurse Practitioners) who all work together to provide you with the care you need, when you need it.  We recommend signing up for the patient portal called "MyChart".  Sign up information is provided on this After Visit Summary.  MyChart is used to connect with patients for Virtual Visits (Telemedicine).  Patients are able to view lab/test results, encounter notes, upcoming appointments, etc.  Non-urgent messages can be sent to your provider as well.   To learn more about what you can do with MyChart, go to [email or web address].    Your next appointment:   6 month(s)  The format for your next appointment:   In Person  Provider:   You may see Nelva Bush, MD or one of the following Advanced Practice Providers on your designated Care Team:   Murray Hodgkins, NP Christell Faith, PA-C Cadence Kathlen Mody, PA-C Gerrie Nordmann, NP   Important Information About Sugar

## 2022-02-01 ENCOUNTER — Telehealth: Payer: Self-pay

## 2022-02-01 NOTE — Progress Notes (Signed)
I reach out to [employer] to check the status of patient renewal application for year 2024.   Per [employer],they did not receive his application. Patient application needs to be refax to [phone] which is the emergency line, and to give 10 days to process due to the high volume of faxes coming through.  Notified Clinical pharmacist.  Bessie Kellihan,CPA Clinical Pharmacist Assistant [phone]   

## 2022-02-06 ENCOUNTER — Other Ambulatory Visit: Payer: Self-pay | Admitting: Family Medicine

## 2022-02-06 DIAGNOSIS — G4709 Other insomnia: Secondary | ICD-10-CM

## 2022-02-07 ENCOUNTER — Other Ambulatory Visit: Payer: Self-pay | Admitting: Family Medicine

## 2022-02-07 DIAGNOSIS — I700 Atherosclerosis of aorta: Secondary | ICD-10-CM

## 2022-02-07 DIAGNOSIS — I2089 Other forms of angina pectoris: Secondary | ICD-10-CM

## 2022-02-25 ENCOUNTER — Other Ambulatory Visit: Payer: Self-pay | Admitting: Internal Medicine

## 2022-02-25 ENCOUNTER — Other Ambulatory Visit: Payer: Self-pay | Admitting: Family Medicine

## 2022-02-25 DIAGNOSIS — Z8711 Personal history of peptic ulcer disease: Secondary | ICD-10-CM

## 2022-02-26 NOTE — Telephone Encounter (Signed)
Requested Prescriptions  Pending Prescriptions Disp Refills   famotidine (PEPCID) 20 MG tablet [Pharmacy Med Name: Famotidine 20 MG Oral Tablet] 100 tablet 0    Sig: TAKE 1 TABLET BY MOUTH DAILY     Gastroenterology:  H2 Antagonists Passed - 02/25/2022 10:08 PM      Passed - Valid encounter within last 12 months    Recent Outpatient Visits           4 months ago Type 2 diabetes mellitus with stage 3a chronic kidney disease, without long-term current use of insulin ([place])   [facility] [place], Drue Stager, MD   8 months ago Type 2 diabetes mellitus with stage 3a chronic kidney disease, without long-term current use of insulin [facility])   [facility] [place], Drue Stager, MD   1 year ago Type 2 diabetes mellitus with stage 3a chronic kidney disease, without long-term current use of insulin [facility])   [facility], Drue Stager, MD   1 year ago Type 2 diabetes mellitus with stage 3a chronic kidney disease, without long-term current use of insulin [facility])   [facility] [place], Drue Stager, MD   1 year ago Diabetes mellitus type 2 in obese [facility])   [facility] Steele Sizer, MD       Future Appointments             In 1 month  [facility], [place]   In 1 month Steele Sizer, MD [facility], [place]   In 3 months [place], [place], [place]. [place]

## 2022-03-06 DIAGNOSIS — G4733 Obstructive sleep apnea (adult) (pediatric): Secondary | ICD-10-CM | POA: Diagnosis not present

## 2022-03-18 ENCOUNTER — Encounter: Payer: Self-pay | Admitting: Family Medicine

## 2022-03-18 ENCOUNTER — Ambulatory Visit (INDEPENDENT_AMBULATORY_CARE_PROVIDER_SITE_OTHER): Payer: Medicare Other | Admitting: Family Medicine

## 2022-03-18 VITALS — BP 124/72 | HR 72 | Temp 97.7°F | Resp 18 | Ht 68.0 in | Wt 203.3 lb

## 2022-03-18 DIAGNOSIS — R040 Epistaxis: Secondary | ICD-10-CM

## 2022-03-18 NOTE — Progress Notes (Signed)
Name: Drew [name]   MRN: [id number]    DOB: 11-12-1940   Date:03/18/2022       Progress Note  Subjective  Chief Complaint  Chief Complaint  Patient presents with   Epistaxis    X2 nights    HPI  Nose bleeds: he had two episodes, first one two nights ago from left nostril and again last night from both nostrils. He takes aspirin but not daily. He denies using nasal sprays, being sick or taking decongestive medications. He uses a CPAP machine with humidifier, he has a wood burning stove and has not been using a pot of water lately to keep the air humid  No fever, chills. Feeling well, denies dizziness or palpitation. He states first episodes seemed to be heavy bleeding but stopped with a cotton ball and pressure  Patient Active Problem List   Diagnosis Date Noted   Controlled gout 10/19/2021   Diabetic polyneuropathy associated with type 2 diabetes mellitus (HCC) 10/19/2021   Insomnia 05/12/2021   Coronary artery disease of native artery of native heart with stable angina pectoris (HCC) 08/14/2020   Aortic atherosclerosis (HCC) 08/14/2020   Atrophy of kidney 10/10/2019   Edema of lower extremity 10/10/2019   Thrombocytopenia (HCC) 08/13/2019   Diabetes mellitus type 2 in obese (HCC) 08/13/2019   Hyperlipidemia associated with type 2 diabetes mellitus (HCC) 08/13/2019   Neuropathy involving both lower extremities 05/04/2019   Polyneuropathy, unspecified 05/04/2019   Multiple renal cysts 04/09/2019   Stable angina 12/29/2018   Onychomycosis of multiple toenails with type 2 diabetes mellitus (HCC) 11/30/2017   Chronic heart failure with preserved ejection fraction (HCC) 10/20/2017   OSA on CPAP 02/10/2017   Abnormal ankle brachial index (ABI) 11/26/2015   Stage 3a chronic kidney disease (HCC) 08/15/2015   H/O: upper GI bleed    Hypothyroidism due to acquired atrophy of thyroid 03/11/2015   History of prostatitis 11/06/2014   Hypertension associated with type 2 diabetes mellitus  (HCC) 11/06/2014   Mixed hyperlipidemia 11/06/2014   Flat feet, bilateral 11/06/2014   Chronic multiple gastric erosions 11/06/2014   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) 11/06/2014    Past Surgical History:  Procedure Laterality Date   CATARACT EXTRACTION, BILATERAL Bilateral 11/18/2017   COLONOSCOPY N/A 12/27/2013   Procedure: COLONOSCOPY;  Surgeon: Barrie Folk, MD;  Location: [facility] ENDOSCOPY;  Service: Endoscopy;  Laterality: N/A;   ENTEROSCOPY N/A 12/21/2013   Procedure: ENTEROSCOPY;  Surgeon: Vertell Novak., MD;  Location: [facility] ENDOSCOPY;  Service: Endoscopy;  Laterality: N/A;   ENTEROSCOPY N/A 12/27/2013   Procedure: ENTEROSCOPY;  Surgeon: Barrie Folk, MD;  Location: [facility] ENDOSCOPY;  Service: Endoscopy;  Laterality: N/A;    Family History  Problem Relation Age of Onset   Hyperlipidemia Father    Kidney disease Father    Heart disease Father    Congestive Heart Failure Mother    Heart disease Mother    Hyperlipidemia Daughter    Hyperlipidemia Son    Diabetes Son    Anxiety disorder Daughter    Depression Daughter    Arthritis Daughter     Social History   Tobacco Use   Smoking status: Former    Years: 1.00    Types: Cigarettes, Cigars    Quit date: 1990    Years since quitting: 34.0   Smokeless tobacco: Never   Tobacco comments:    Smoked afternoon cigar  Substance Use Topics   Alcohol use: No  Current Outpatient Medications:    acetaminophen (TYLENOL) 500 MG tablet, Take 500 mg by mouth every 6 (six) hours as needed., Disp: , Rfl:    albuterol (VENTOLIN HFA) 108 (90 Base) MCG/ACT inhaler, SMARTSIG:2 Puff(s) By Mouth Every 4 Hours PRN, Disp: , Rfl:    allopurinol (ZYLOPRIM) 100 MG tablet, Take 100 mg by mouth 2 (two) times daily., Disp: , Rfl:    amLODipine (NORVASC) 5 MG tablet, TAKE 1 TABLET BY MOUTH DAILY, Disp: 90 tablet, Rfl: 3   aspirin EC 81 MG tablet, Take 81 mg by mouth daily., Disp: , Rfl:    carvedilol (COREG)  3.125 MG tablet, TAKE 1 TABLET BY MOUTH TWICE  DAILY, Disp: 180 tablet, Rfl: 3   Cholecalciferol (VITAMIN D3) 25 MCG (1000 UT) CAPS, Take 1,000 Units by mouth daily., Disp: , Rfl:    Coenzyme Q10 (CO Q 10) 100 MG CAPS, Take 100 mg by mouth daily., Disp: , Rfl:    famotidine (PEPCID) 20 MG tablet, TAKE 1 TABLET BY MOUTH DAILY, Disp: 100 tablet, Rfl: 0   levothyroxine (SYNTHROID) 25 MCG tablet, TAKE 1 TABLET BY MOUTH IN THE  MORNING, Disp: 90 tablet, Rfl: 3   losartan (COZAAR) 25 MG tablet, TAKE 1 TABLET BY MOUTH DAILY, Disp: 90 tablet, Rfl: 2   Multiple Vitamins-Minerals (CENTRUM SILVER PO), Take 200 mg by mouth daily., Disp: , Rfl:    Needle, Disp, 32G X 5/16" MISC, 6 mm by Does not apply route 2 (two) times daily., Disp: 100 each, Rfl: 3   nitroGLYCERIN (NITROSTAT) 0.4 MG SL tablet, Place 1 tablet (0.4 mg total) under the tongue every 5 (five) minutes as needed for chest pain., Disp: 25 tablet, Rfl: 0   Omega-3 Fatty Acids (FISH OIL) 1000 MG CAPS, Take 1,000 mg by mouth daily., Disp: , Rfl:    rosuvastatin (CRESTOR) 20 MG tablet, TAKE 1 TABLET BY MOUTH DAILY, Disp: 100 tablet, Rfl: 2   traZODone (DESYREL) 50 MG tablet, TAKE 1/2 TO 1 TABLET BY MOUTH AT BEDTIME AS NEEDED FOR SLEEP, Disp: 90 tablet, Rfl: 1   triamcinolone cream (KENALOG) 0.1 %, Apply 1 application topically 2 (two) times daily., Disp: 30 g, Rfl: 0   Turmeric 500 MG CAPS, Take 500 mg by mouth daily., Disp: , Rfl:    VICTOZA 18 MG/3ML SOPN, INJECT SUBCUTANEOUSLY 1.2  MG DAILY, Disp: 18 mL, Rfl: 3  Allergies  Allergen Reactions   Niacin And Related Itching and Rash    I personally reviewed active problem list, medication list, allergies, family history with the patient/caregiver today.   ROS  Ten systems reviewed and is negative except as mentioned in HPI   Objective  Vitals:   03/18/22 1126  BP: 124/72  Pulse: 72  Resp: 18  Temp: 97.7 F (36.5 C)  TempSrc: Oral  SpO2: 96%  Weight: 203 lb 4.8 oz (92.2 kg)  Height:  5\' 8"  (1.727 m)    Body mass index is 30.91 kg/m.  Physical Exam  Constitutional: Patient appears well-developed and well-nourished. Obese  No distress.  HEENT: head atraumatic, normocephalic, pupils equal and reactive to light, ears normal TM,, noses showed mild erythema but no source of bleeding,  neck supple Cardiovascular: Normal rate, regular rhythm and normal heart sounds.  No murmur heard. No BLE edema. Pulmonary/Chest: Effort normal and breath sounds normal. No respiratory distress. Abdominal: Soft.  There is no tenderness. Psychiatric: Patient has a normal mood and affect. behavior is normal. Judgment and thought content normal.  PHQ2/9:    03/18/2022   11:28 AM 10/19/2021    9:46 AM 06/18/2021   10:03 AM 04/16/2021    2:28 PM 02/17/2021    9:48 AM  Depression screen PHQ 2/9  Decreased Interest 0 0 0 0 0  Down, Depressed, Hopeless 0 0 0 0 0  PHQ - 2 Score 0 0 0 0 0  Altered sleeping 0 0 0 0 0  Tired, decreased energy 0 0 0 0 0  Change in appetite 0 0 3 0 0  Feeling bad or failure about yourself  0 0 0 0 0  Trouble concentrating 0 0 0 0 0  Moving slowly or fidgety/restless 0 0 0 0 0  Suicidal thoughts 0 0 0 0 0  PHQ-9 Score 0 0 3 0 0    phq 9 is negative   Fall Risk:    03/18/2022   11:28 AM 10/19/2021    9:45 AM 06/18/2021   10:02 AM 04/16/2021    2:33 PM 02/17/2021    9:48 AM  Fall Risk   Falls in the past year? 0 1 0 1 1  Number falls in past yr:  0  1 1  Injury with Fall?  0  0 0  Risk for fall due to : No Fall Risks No Fall Risks No Fall Risks No Fall Risks No Fall Risks  Follow up Falls prevention discussed Falls prevention discussed Falls prevention discussed;Education provided Falls prevention discussed Falls prevention discussed     Functional Status Survey: Is the patient deaf or have difficulty hearing?: No Does the patient have difficulty seeing, even when wearing glasses/contacts?: No Does the patient have difficulty concentrating, remembering,  or making decisions?: No Does the patient have difficulty walking or climbing stairs?: No Does the patient have difficulty dressing or bathing?: No Does the patient have difficulty doing errands alone such as visiting a doctor's office or shopping?: No    Assessment & Plan  1. Epistaxis  Advised to hold aspirin for at least 3 days past last bleeding episode. Avoid placing anything on nostrils.  Add pot with water on wooden stove to decrease dryness inside the house, may also add a moist washcloth in their bedroom, contact me for referral to ENT if recurrence

## 2022-03-24 DIAGNOSIS — E1021 Type 1 diabetes mellitus with diabetic nephropathy: Secondary | ICD-10-CM | POA: Diagnosis not present

## 2022-03-29 IMAGING — CT CT HEART MORP W/ CTA COR W/ SCORE W/ CA W/CM &/OR W/O CM
2 of 13 series · 5 of 20 positions shown, 6 images · non-contrast
Comparison: None.

Addendum:
CLINICAL DATA: chestpain

EXAM:
Cardiac/Coronary  CTA
TECHNIQUE: The patient was scanned on a Siemens Somatoform go.Top scanner.

[Series 27: multiphase % cta coronary 0.60 · axial · 0.41mm/px · z∈[-1090,-1037]mm · 3 of 2690 slices shown, 4 images]
[im 673/2690  vessel]
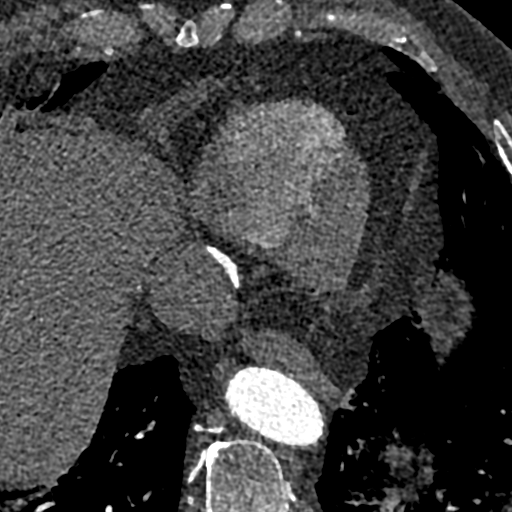
[im 673/2690  lung]
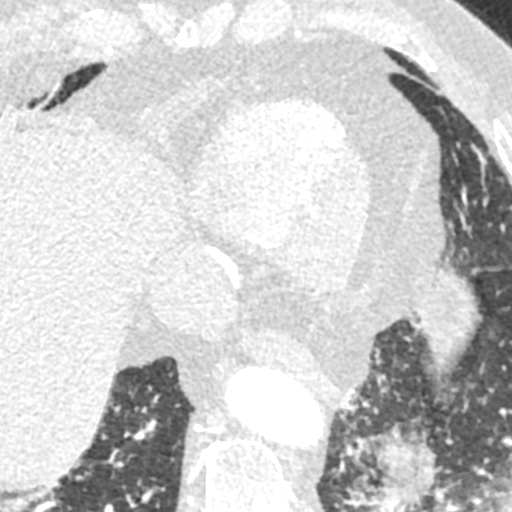
[im 1345/2690  vessel]
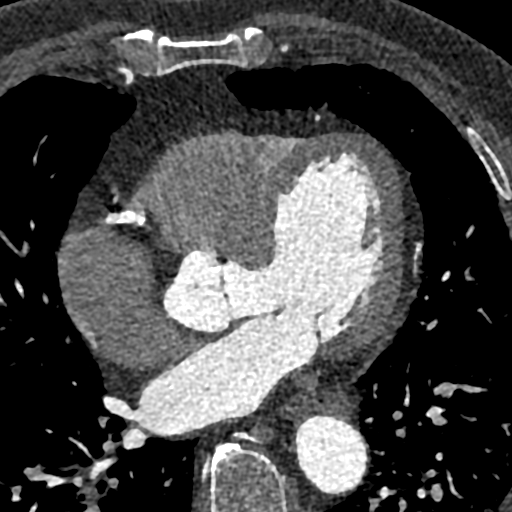
[im 2017/2690  vessel]
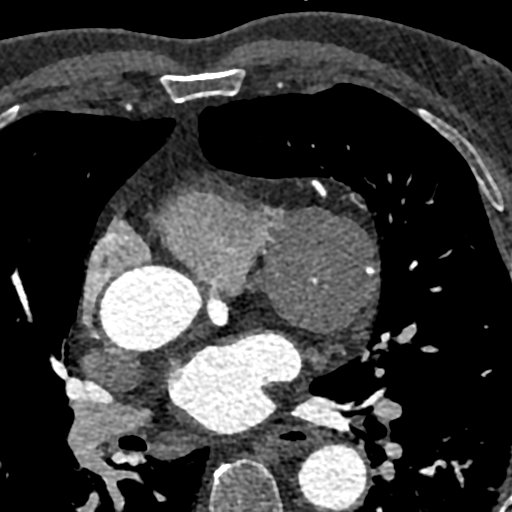

[Series 42: ms multiphase cta coronary 0.60 · axial · 0.41mm/px · z∈[-1082,-1046]mm · 2 of 2152 slices shown]
[im 718/2152  vessel]
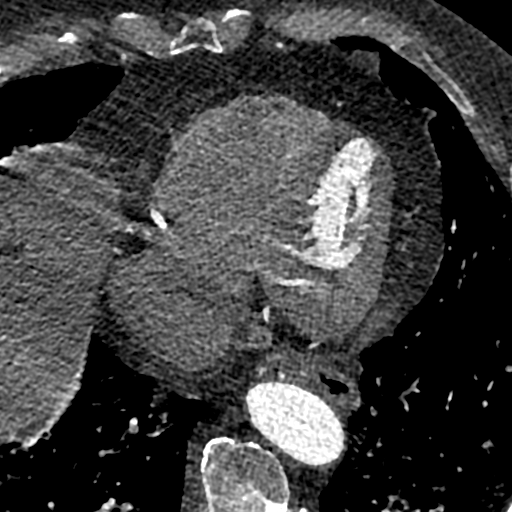
[im 1435/2152  vessel]
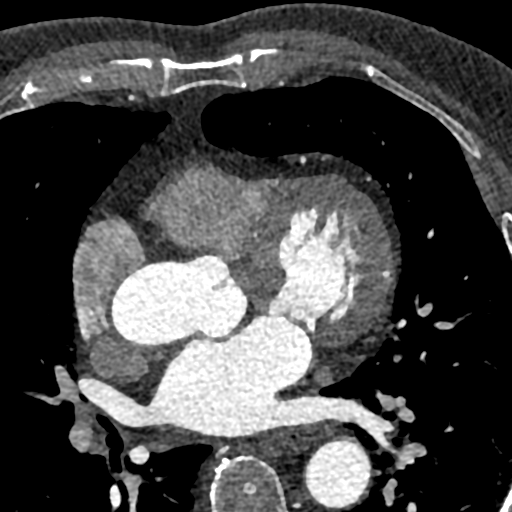

[5 of 20 positions shown; findings below may reference images not displayed]

FINDINGS: A retrospective scan was triggered in the descending thoracic aorta.
Axial non-contrast 3 mm slices were carried out through the heart.
The data set was analyzed on a dedicated work station and scored
using the Agatson method. Gantry rotation speed was 330 msecs and
collimation was .6 mm. 25mg of metoprolol and 0.8 mg of sl NTG was
given. The 3D data set was reconstructed in 5% intervals of the
60-95 % of the R-R cycle. Diastolic phases were analyzed on a
dedicated work station using MPR, MIP and VRT modes. The patient
received 90 cc of contrast.

Aorta:  Normal size.  No calcifications.  No dissection.

Aortic Valve:  Trileaflet.  No calcifications.

Coronary Arteries:  Normal coronary origin.  Right dominance.

RCA is a large dominant artery that gives rise to PDA, PLA and then
supplies an OM branch. There is calcified plaque throughout the
vessel causing moderate (50-69%) stenosis in the proximal segment
and mild stenosis (25-49%) in the mid to distal segments.

Left main is a large artery that gives rise to LAD and LCX arteries.

LAD is a large vessel that gives rise to a large first diagonal
branch There is calcified plaque in the proximal to mid segments
causing mild stenosis (25-49%)

LCX is a very small non-dominant artery. There is no plaque.

Other findings:

Normal pulmonary vein drainage into the left atrium.

Normal left atrial appendage without a thrombus.

Normal size of the pulmonary artery.
IMPRESSION: 1. High coronary calcium score of 5028. This was 79th percentile for
age and sex matched control.

2. Normal coronary origin with right dominance.

3. Calcified plaque in the proximal RCA causing moderate stenosis
(50-69%).

4. Calcified plaque in the proximal to mid LAD, mid to distal RCA
causing mild stenosis (25-49%)

5. CAD-RADS 3. Moderate stenosis. Consider symptom-guided
anti-ischemic pharmacotherapy as well as risk factor modification
per guideline directed care. Additional analysis with CT FFR will be
submitted and reported separately.

EXAM:
OVER-READ INTERPRETATION  CT CHEST

The following report is an over-read performed by radiologist Dr.
over-read does not include interpretation of cardiac or coronary
anatomy or pathology. The coronary calcium score and cardiac CTA
interpretation by the cardiologist is attached.
FINDINGS: Moderate-sized hiatal hernia. Aortic atherosclerosis. Within the
visualized portions of the thorax there are no suspicious appearing
pulmonary nodules or masses, there is no acute consolidative
airspace disease, no pleural effusions, no pneumothorax and no
lymphadenopathy. Visualized portions of the upper abdomen are
unremarkable. There are no aggressive appearing lytic or blastic
lesions noted in the visualized portions of the skeleton.
IMPRESSION: 1.  Aortic Atherosclerosis ([id number]).
2. Moderate-sized hiatal hernia.

*** End of Addendum ***
FINDINGS: A retrospective scan was triggered in the descending thoracic aorta.
Axial non-contrast 3 mm slices were carried out through the heart.
The data set was analyzed on a dedicated work station and scored
using the Agatson method. Gantry rotation speed was 330 msecs and
collimation was .6 mm. 25mg of metoprolol and 0.8 mg of sl NTG was
given. The 3D data set was reconstructed in 5% intervals of the
60-95 % of the R-R cycle. Diastolic phases were analyzed on a
dedicated work station using MPR, MIP and VRT modes. The patient
received 90 cc of contrast.

Aorta:  Normal size.  No calcifications.  No dissection.

Aortic Valve:  Trileaflet.  No calcifications.

Coronary Arteries:  Normal coronary origin.  Right dominance.

RCA is a large dominant artery that gives rise to PDA, PLA and then
supplies an OM branch. There is calcified plaque throughout the
vessel causing moderate (50-69%) stenosis in the proximal segment
and mild stenosis (25-49%) in the mid to distal segments.

Left main is a large artery that gives rise to LAD and LCX arteries.

LAD is a large vessel that gives rise to a large first diagonal
branch There is calcified plaque in the proximal to mid segments
causing mild stenosis (25-49%)

LCX is a very small non-dominant artery. There is no plaque.

Other findings:

Normal pulmonary vein drainage into the left atrium.

Normal left atrial appendage without a thrombus.

Normal size of the pulmonary artery.
IMPRESSION: 1. High coronary calcium score of 5028. This was 79th percentile for
age and sex matched control.

2. Normal coronary origin with right dominance.

3. Calcified plaque in the proximal RCA causing moderate stenosis
(50-69%).

4. Calcified plaque in the proximal to mid LAD, mid to distal RCA
causing mild stenosis (25-49%)

5. CAD-RADS 3. Moderate stenosis. Consider symptom-guided
anti-ischemic pharmacotherapy as well as risk factor modification
per guideline directed care. Additional analysis with CT FFR will be
submitted and reported separately.

## 2022-03-30 DIAGNOSIS — E1165 Type 2 diabetes mellitus with hyperglycemia: Secondary | ICD-10-CM | POA: Diagnosis not present

## 2022-04-20 ENCOUNTER — Ambulatory Visit: Payer: Medicare Other

## 2022-04-22 DIAGNOSIS — E1021 Type 1 diabetes mellitus with diabetic nephropathy: Secondary | ICD-10-CM | POA: Diagnosis not present

## 2022-04-23 ENCOUNTER — Ambulatory Visit: Payer: Medicare Other | Admitting: Family Medicine

## 2022-04-23 ENCOUNTER — Ambulatory Visit: Payer: Medicare Other

## 2022-04-23 DIAGNOSIS — E1021 Type 1 diabetes mellitus with diabetic nephropathy: Secondary | ICD-10-CM | POA: Diagnosis not present

## 2022-04-23 NOTE — Progress Notes (Unsigned)
Name: Drew [name]   MRN: [id number]    DOB: 13-Aug-1940   Date:04/26/2022       Progress Note  Subjective  Chief Complaint  Follow Up  HPI  DMII: he has obesity, dyslipidemia, HTN and CKI. His weight is going down, but BMI is now below 35, A1C had  gone from 6.2 % to 6.9 %, 6.3% , 6.4% , 6.5 %, 6.4 % last visit was  6 % and today is 6.2 %   He is still taking Victoza daily and denies hypoglycemic episodes glucose,  he states glucose has been controlled.  Denies polyphagia, polyuria or polydipsia.  He is on ARB and statin therapy. He denies side effects of medication . He has lower extremity neuropathy. Reminded him again to have a repeat eye exam   OSA: he is complaint with CPAP and states doing well, he occasionally still wakes up with a headache.   HTN/CHF with preserved EF : bp is towards low end of normal again, wife brought all medications and he is taking losartan 25 mg , carvedilol 3.125 mg BID and norvasc 5 mg. He has intermittent chest pain and has noticed feeling tired lately .Marland Kitchen He sees Dr. Saunders Revel - cardiologist , no orthopnea, wheezing but he has intermittent sob with moderate activity that has been stable . He has intermittent lower extremity edema . We will decrease norvasc to 2.5 mg and return in one week for bp check We will add SGL-2 agonist today   Dyslipidemia: he is doing well on medication, he is compliant, last LDL was down to 44. He is under the care of Dr. Saunders Revel    Stable Angina/Atherosclerosis aorta : he has intermittent chest pain but intermittent only,  under the care of Dr. Saunders Revel he does not have NTG at home. He states he has been doing well lately, taking higher dose of Crestor and aspirin daily, he is also on a beta blocker .   Obesity  BMI is now below  35 , he gained 13 lbs since last visit, discussed importance of increasing physical activity - he stopped walking   Controlled gout: on allopurinol , no recent episodes, uric acid below 6. 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 Candiss Norse, no pruritus, good urine output, last GFR was 51  , BP towards low end of normal  we will decrease dose of norvasc today to 2.5 mg and monitor  Hypothyroidism: on the same dose of levothyroxine , denies dysphagia, dry skin or change in bowel movements, last TSH at goal   Patient Active Problem List   Diagnosis Date Noted   Controlled gout 10/19/2021   Diabetic polyneuropathy associated with type 2 diabetes mellitus ([place]) 10/19/2021   Insomnia 05/12/2021   Coronary artery disease of native artery of native heart with stable angina pectoris ([place]) 08/14/2020   Aortic atherosclerosis ([place]) 08/14/2020   Atrophy of kidney 10/10/2019   Edema of lower extremity 10/10/2019   Thrombocytopenia ([place]) 08/13/2019   Diabetes mellitus type 2 in obese ([place]) 08/13/2019   Hyperlipidemia associated with type 2 diabetes mellitus ([place]) 08/13/2019   Neuropathy involving both lower extremities 05/04/2019   Polyneuropathy, unspecified 05/04/2019   Multiple renal cysts 04/09/2019   Stable angina 12/29/2018   Onychomycosis of multiple toenails with type 2 diabetes mellitus ([place]) 11/30/2017   Chronic heart failure with preserved ejection fraction ([place]) 10/20/2017   OSA on CPAP 02/10/2017   Abnormal ankle brachial index (ABI) 11/26/2015  Stage 3a chronic kidney disease ([place]) 08/15/2015   H/O: upper GI bleed    Hypothyroidism due to acquired atrophy of thyroid 03/11/2015   History of prostatitis 11/06/2014   Hypertension associated with type 2 diabetes mellitus ([place]) 11/06/2014   Mixed hyperlipidemia 11/06/2014   Flat feet, bilateral 11/06/2014   Chronic multiple gastric erosions 11/06/2014   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 ([place]) 11/06/2014    Past Surgical History:  Procedure Laterality Date   CATARACT EXTRACTION, BILATERAL Bilateral 11/18/2017   COLONOSCOPY N/A 12/27/2013   Procedure: COLONOSCOPY;  Surgeon: Missy Sabins, MD;  Location: [place];  Service: Endoscopy;  Laterality: N/A;   ENTEROSCOPY N/A 12/21/2013   Procedure: ENTEROSCOPY;  Surgeon: Winfield Cunas., MD;  Location[facility] ENDOSCOPY;  Service: Endoscopy;  Laterality: N/A;   ENTEROSCOPY N/A 12/27/2013   Procedure: ENTEROSCOPY;  Surgeon: Missy Sabins, MD;  Location: [facility] ENDOSCOPY;  Service: Endoscopy;  Laterality: N/A;    Family History  Problem Relation Age of Onset   Hyperlipidemia Father    Kidney disease Father    Heart disease Father    Congestive Heart Failure Mother    Heart disease Mother    Hyperlipidemia Daughter    Hyperlipidemia Son    Diabetes Son    Anxiety disorder Daughter    Depression Daughter    Arthritis Daughter     Social History   Tobacco Use   Smoking status: Former    Years: 1.00    Types: Cigarettes, Cigars    Quit date: 1990    Years since quitting: 34.1   Smokeless tobacco: Never   Tobacco comments:    Smoked afternoon cigar  Substance Use Topics   Alcohol use: No     Current Outpatient Medications:    acetaminophen (TYLENOL) 500 MG tablet, Take 500 mg by mouth every 6 (six) hours as needed., Disp: , Rfl:    albuterol (VENTOLIN HFA) 108 (90 Base) MCG/ACT inhaler, SMARTSIG:2 Puff(s) By Mouth Every 4 Hours PRN, Disp: , Rfl:    allopurinol (ZYLOPRIM) 100 MG tablet, Take 100 mg by mouth 2 (two) times daily., Disp: , Rfl:    amLODipine (NORVASC) 5 MG tablet, TAKE 1 TABLET BY MOUTH DAILY, Disp: 90 tablet, Rfl: 3   aspirin EC 81 MG tablet, Take 81 mg by mouth daily., Disp: , Rfl:    carvedilol (COREG) 3.125 MG tablet, TAKE 1 TABLET BY MOUTH TWICE  DAILY, Disp: 180 tablet, Rfl: 3   Cholecalciferol (VITAMIN D3) 25 MCG (1000 UT) CAPS, Take 1,000 Units by mouth daily., Disp: , Rfl:    Coenzyme Q10 (CO Q 10) 100 MG CAPS, Take 100 mg by mouth daily., Disp: , Rfl:    famotidine (PEPCID) 20 MG tablet, TAKE 1 TABLET BY MOUTH DAILY, Disp: 100 tablet, Rfl: 0   levothyroxine (SYNTHROID) 25 MCG tablet, TAKE 1 TABLET BY MOUTH IN THE   MORNING, Disp: 90 tablet, Rfl: 3   losartan (COZAAR) 25 MG tablet, TAKE 1 TABLET BY MOUTH DAILY, Disp: 90 tablet, Rfl: 2   Multiple Vitamins-Minerals (CENTRUM SILVER PO), Take 200 mg by mouth daily., Disp: , Rfl:    Needle, Disp, 32G X 5/16" MISC, 6 mm by Does not apply route 2 (two) times daily., Disp: 100 each, Rfl: 3   Omega-3 Fatty Acids (FISH OIL) 1000 MG CAPS, Take 1,000 mg by mouth daily., Disp: , Rfl:    rosuvastatin (CRESTOR) 20 MG tablet, TAKE 1 TABLET BY MOUTH DAILY, Disp:  100 tablet, Rfl: 2   traZODone (DESYREL) 50 MG tablet, TAKE 1/2 TO 1 TABLET BY MOUTH AT BEDTIME AS NEEDED FOR SLEEP, Disp: 90 tablet, Rfl: 1   triamcinolone cream (KENALOG) 0.1 %, Apply 1 application topically 2 (two) times daily., Disp: 30 g, Rfl: 0   Turmeric 500 MG CAPS, Take 500 mg by mouth daily., Disp: , Rfl:    VICTOZA 18 MG/3ML SOPN, INJECT SUBCUTANEOUSLY 1.2  MG DAILY, Disp: 18 mL, Rfl: 3   nitroGLYCERIN (NITROSTAT) 0.4 MG SL tablet, Place 1 tablet (0.4 mg total) under the tongue every 5 (five) minutes as needed for chest pain., Disp: 25 tablet, Rfl: 0  Allergies  Allergen Reactions   Niacin And Related Itching and Rash    I personally reviewed active problem list, medication list, allergies, family history, social history, health maintenance with the patient/caregiver today.   ROS  Constitutional: Negative for fever , positive for weight change.  Respiratory: Negative for cough and shortness of breath.   Cardiovascular: Negative for chest pain or palpitations.  Gastrointestinal: Negative for abdominal pain, no bowel changes.  Musculoskeletal: Negative for gait problem or joint swelling.  Skin: Negative for rash.  Neurological: Negative for dizziness or headache.  No other specific complaints in a complete review of systems (except as listed in HPI above).   Objective  Vitals:   04/26/22 1017  BP: 118/64  Pulse: 77  Resp: 18  Temp: 97.7 F (36.5 C)  TempSrc: Oral  SpO2: 93%  Weight: 216  lb 6.4 oz (98.2 kg)  Height: '5\' 8"'$  (1.727 m)    Body mass index is 32.9 kg/m.  Physical Exam  Constitutional: Patient appears well-developed and well-nourished. Obese  No distress.  HEENT: head atraumatic, normocephalic, pupils equal and reactive to light, neck supple Cardiovascular: Normal rate, regular rhythm and normal heart sounds.  No murmur heard. No BLE edema. Pulmonary/Chest: Effort normal and breath sounds normal. No respiratory distress. Abdominal: Soft.  There is no tenderness. Psychiatric: Patient has a normal mood and affect. behavior is normal. Judgment and thought content normal.   Recent Results (from the past 2160 hour(s))  POCT HgB A1C     Status: Abnormal   Collection Time: 04/26/22 10:19 AM  Result Value Ref Range   Hemoglobin A1C 6.2 (A) 4.0 - 5.6 %   HbA1c POC (<> result, manual entry)     HbA1c, POC (prediabetic range)     HbA1c, POC (controlled diabetic range)       PHQ2/9:    04/26/2022   10:18 AM 03/18/2022   11:28 AM 10/19/2021    9:46 AM 06/18/2021   10:03 AM 04/16/2021    2:28 PM  Depression screen PHQ 2/9  Decreased Interest 0 0 0 0 0  Down, Depressed, Hopeless 0 0 0 0 0  PHQ - 2 Score 0 0 0 0 0  Altered sleeping 0 0 0 0 0  Tired, decreased energy 0 0 0 0 0  Change in appetite 0 0 0 3 0  Feeling bad or failure about yourself  0 0 0 0 0  Trouble concentrating 0 0 0 0 0  Moving slowly or fidgety/restless 0 0 0 0 0  Suicidal thoughts 0 0 0 0 0  PHQ-9 Score 0 0 0 3 0    phq 9 is negative   Fall Risk:    04/26/2022   10:18 AM 03/18/2022   11:28 AM 10/19/2021    9:45 AM 06/18/2021   10:02 AM 04/16/2021  2:33 PM  Fall Risk   Falls in the past year? 1 0 1 0 1  Number falls in past yr: 0  0  1  Injury with Fall? 0  0  0  Risk for fall due to : History of fall(s) No Fall Risks No Fall Risks No Fall Risks No Fall Risks  Follow up Falls prevention discussed;Falls evaluation completed;Education provided Falls prevention discussed Falls prevention  discussed Falls prevention discussed;Education provided Falls prevention discussed      Functional Status Survey: Is the patient deaf or have difficulty hearing?: No Does the patient have difficulty seeing, even when wearing glasses/contacts?: No Does the patient have difficulty concentrating, remembering, or making decisions?: No Does the patient have difficulty walking or climbing stairs?: No Does the patient have difficulty dressing or bathing?: No Does the patient have difficulty doing errands alone such as visiting a doctor's office or shopping?: No    Assessment & Plan  1. Type 2 diabetes mellitus with stage 3a chronic kidney disease, without long-term current use of insulin (HCC)  - POCT HgB A1C - liraglutide (VICTOZA) 18 MG/3ML SOPN; Inject 1.8 mg into the skin daily.  Dispense: 9 mL; Refill: 3 - dapagliflozin propanediol (FARXIGA) 10 MG TABS tablet; Take 1 tablet (10 mg total) by mouth daily before breakfast.  Dispense: 90 tablet; Refill: 3  2. OSA on CPAP  Compliant   3. Aortic atherosclerosis ([place])  On statin therapy   4. Chronic heart failure with preserved ejection fraction (HCC)  - dapagliflozin propanediol (FARXIGA) 10 MG TABS tablet; Take 1 tablet (10 mg total) by mouth daily before breakfast.  Dispense: 90 tablet; Refill: 3  5. Controlled gout   6. Hypertension associated with type 2 diabetes mellitus (HCC)  - amLODipine (NORVASC) 5 MG tablet; Take 0.5 tablets (2.5 mg total) by mouth daily.  Dispense: 1 tablet; Refill: 0  7. Stage 3a chronic kidney disease (HCC)  - dapagliflozin propanediol (FARXIGA) 10 MG TABS tablet; Take 1 tablet (10 mg total) by mouth daily before breakfast.  Dispense: 90 tablet; Refill: 3  8. Obesity (BMI 30.0-34.9)  He will start walking again   9. Diabetes mellitus type 2 in obese ([place])

## 2022-04-26 ENCOUNTER — Ambulatory Visit (INDEPENDENT_AMBULATORY_CARE_PROVIDER_SITE_OTHER): Payer: Medicare Other | Admitting: Family Medicine

## 2022-04-26 ENCOUNTER — Encounter: Payer: Self-pay | Admitting: Family Medicine

## 2022-04-26 VITALS — BP 118/64 | HR 77 | Temp 97.7°F | Resp 18 | Ht 68.0 in | Wt 216.4 lb

## 2022-04-26 DIAGNOSIS — E1122 Type 2 diabetes mellitus with diabetic chronic kidney disease: Secondary | ICD-10-CM

## 2022-04-26 DIAGNOSIS — E1159 Type 2 diabetes mellitus with other circulatory complications: Secondary | ICD-10-CM

## 2022-04-26 DIAGNOSIS — E66811 Obesity, class 1: Secondary | ICD-10-CM

## 2022-04-26 DIAGNOSIS — E669 Obesity, unspecified: Secondary | ICD-10-CM

## 2022-04-26 DIAGNOSIS — G4733 Obstructive sleep apnea (adult) (pediatric): Secondary | ICD-10-CM | POA: Diagnosis not present

## 2022-04-26 DIAGNOSIS — E1169 Type 2 diabetes mellitus with other specified complication: Secondary | ICD-10-CM | POA: Diagnosis not present

## 2022-04-26 DIAGNOSIS — I5032 Chronic diastolic (congestive) heart failure: Secondary | ICD-10-CM | POA: Diagnosis not present

## 2022-04-26 DIAGNOSIS — M109 Gout, unspecified: Secondary | ICD-10-CM

## 2022-04-26 DIAGNOSIS — N1831 Chronic kidney disease, stage 3a: Secondary | ICD-10-CM

## 2022-04-26 DIAGNOSIS — I152 Hypertension secondary to endocrine disorders: Secondary | ICD-10-CM

## 2022-04-26 DIAGNOSIS — I700 Atherosclerosis of aorta: Secondary | ICD-10-CM | POA: Diagnosis not present

## 2022-04-26 LAB — POCT GLYCOSYLATED HEMOGLOBIN (HGB A1C): Hemoglobin A1C: 6.2 % — AB (ref 4.0–5.6)

## 2022-04-26 MED ORDER — DAPAGLIFLOZIN PROPANEDIOL 10 MG PO TABS: 10.0000 mg | ORAL_TABLET | Freq: Every day | ORAL | 3 refills | Status: DC

## 2022-04-26 MED ORDER — VICTOZA 18 MG/3ML ~~LOC~~ SOPN: 1.8000 mg | PEN_INJECTOR | Freq: Every day | SUBCUTANEOUS | 3 refills | Status: AC

## 2022-04-26 MED ORDER — AMLODIPINE BESYLATE 5 MG PO TABS: 2.5000 mg | ORAL_TABLET | Freq: Every day | ORAL | 0 refills | Status: DC

## 2022-04-26 MED ORDER — TRULICITY 3 MG/0.5ML ~~LOC~~ SOAJ: 3.0000 mg | SUBCUTANEOUS | 0 refills | Status: DC

## 2022-05-02 ENCOUNTER — Other Ambulatory Visit: Payer: Self-pay | Admitting: Internal Medicine

## 2022-05-09 ENCOUNTER — Other Ambulatory Visit: Payer: Self-pay | Admitting: Family Medicine

## 2022-05-09 DIAGNOSIS — Z8711 Personal history of peptic ulcer disease: Secondary | ICD-10-CM

## 2022-05-10 ENCOUNTER — Ambulatory Visit: Payer: Medicare Other

## 2022-05-10 NOTE — Progress Notes (Signed)
Patient presented today for bp check. It was 112/74. Physician notified and patient was pleasant, expressed no questions or concerns at time of departure.

## 2022-05-17 DIAGNOSIS — E1122 Type 2 diabetes mellitus with diabetic chronic kidney disease: Secondary | ICD-10-CM | POA: Diagnosis not present

## 2022-05-22 ENCOUNTER — Other Ambulatory Visit: Payer: Self-pay | Admitting: Family Medicine

## 2022-05-22 DIAGNOSIS — E034 Atrophy of thyroid (acquired): Secondary | ICD-10-CM

## 2022-05-23 DIAGNOSIS — E1165 Type 2 diabetes mellitus with hyperglycemia: Secondary | ICD-10-CM | POA: Diagnosis not present

## 2022-05-27 ENCOUNTER — Ambulatory Visit (INDEPENDENT_AMBULATORY_CARE_PROVIDER_SITE_OTHER): Payer: Medicare Other

## 2022-05-27 VITALS — Ht 68.0 in | Wt 216.0 lb

## 2022-05-27 DIAGNOSIS — Z0000 Encounter for general adult medical examination without abnormal findings: Secondary | ICD-10-CM

## 2022-05-27 NOTE — Patient Instructions (Signed)
[name] , Thank you for taking time to come for your Medicare Wellness Visit. I appreciate your ongoing commitment to your health goals. Please review the following plan we discussed and let me know if I can assist you in the future.   These are the goals we discussed:  Goals      DIET - EAT MORE FRUITS AND VEGETABLES     Recommend eating 3-4 servings of fruits and vegetables per day     Monitor and Manage My Blood Sugar-Diabetes Type 2     Timeframe:  Long-Range Goal Priority:  High Start Date: 07/16/2020                             Expected End Date:  01/16/2022                      Follow Up Date 11/21/2020   -check blood sugar daily - check blood sugar if I feel it is too high or too low - enter blood sugar readings and medication or insulin into daily log - take the blood sugar log to all doctor visits    Why is this important?   Checking your blood sugar at home helps to keep it from getting very high or very low.  Writing the results in a diary or log helps the doctor know how to care for you.  Your blood sugar log should have the time, date and the results.  Also, write down the amount of insulin or other medicine that you take.  Other information, like what you ate, exercise done and how you were feeling, will also be helpful.     Notes:      Prevent falls     Pt advised to install railing outside home for steps and grab bars in the shower.      Track and Manage Fluids and Swelling-Heart Failure     Timeframe:  Long-Range Goal Priority:  High Start Date:  07/16/2020                           Expected End Date: 01/16/2022                      Follow Up Date 12/23/2020   - call office if I gain more than 2 pounds in one day or 5 pounds in one week - track weight in diary - use salt in moderation - watch for swelling in feet, ankles and legs every day - weigh myself daily    Why is this important?   It is important to check your weight daily and watch how much  salt and liquids you have.  It will help you to manage your heart failure.    Notes:         This is a list of the screening recommended for you and due dates:  Health Maintenance  Topic Date Due   Eye exam for diabetics  06/24/2020   COVID-19 Vaccine (4 - 2023-24 season) 10/23/2021   Yearly kidney health urinalysis for diabetes  06/19/2022   Complete foot exam   06/19/2022   Yearly kidney function blood test for diabetes  07/08/2022   Flu Shot  09/23/2022   Hemoglobin A1C  10/27/2022   Medicare Annual Wellness Visit  05/27/2023   DTaP/Tdap/Td vaccine (3 - Td or Tdap) 06/17/2030  Pneumonia Vaccine  Completed   Zoster (Shingles) Vaccine  Completed   HPV Vaccine  Aged Out    Advanced directives: no..mailed  Conditions/risks identified: low falls risk  Next appointment: Follow up in one year for your annual wellness visit. 06/02/2023@ 8:15AM telephone  Preventive Care 65 Years and Older, Male  Preventive care refers to lifestyle choices and visits with your health care provider that can promote health and wellness. What does preventive care include? A yearly physical exam. This is also called an annual well check. Dental exams once or twice a year. Routine eye exams. Ask your health care provider how often you should have your eyes checked. Personal lifestyle choices, including: Daily care of your teeth and gums. Regular physical activity. Eating a healthy diet. Avoiding tobacco and drug use. Limiting alcohol use. Practicing safe sex. Taking low doses of aspirin every day. Taking vitamin and mineral supplements as recommended by your health care provider. What happens during an annual well check? The services and screenings done by your health care provider during your annual well check will depend on your age, overall health, lifestyle risk factors, and family history of disease. Counseling  Your health care provider may ask you questions about your: Alcohol  use. Tobacco use. Drug use. Emotional well-being. Home and relationship well-being. Sexual activity. Eating habits. History of falls. Memory and ability to understand (cognition). Work and work Statistician. Screening  You may have the following tests or measurements: Height, weight, and BMI. Blood pressure. Lipid and cholesterol levels. These may be checked every 5 years, or more frequently if you are over 96 years old. Skin check. Lung cancer screening. You may have this screening every year starting at age 67 if you have a 30-pack-year history of smoking and currently smoke or have quit within the past 15 years. Fecal occult blood test (FOBT) of the stool. You may have this test every year starting at age 62. Flexible sigmoidoscopy or colonoscopy. You may have a sigmoidoscopy every 5 years or a colonoscopy every 10 years starting at age 46. Prostate cancer screening. Recommendations will vary depending on your family history and other risks. Hepatitis C blood test. Hepatitis B blood test. Sexually transmitted disease (STD) testing. Diabetes screening. This is done by checking your blood sugar (glucose) after you have not eaten for a while (fasting). You may have this done every 1-3 years. Abdominal aortic aneurysm (AAA) screening. You may need this if you are a current or former smoker. Osteoporosis. You may be screened starting at age 52 if you are at high risk. Talk with your health care provider about your test results, treatment options, and if necessary, the need for more tests. Vaccines  Your health care provider may recommend certain vaccines, such as: Influenza vaccine. This is recommended every year. Tetanus, diphtheria, and acellular pertussis (Tdap, Td) vaccine. You may need a Td booster every 10 years. Zoster vaccine. You may need this after age 103. Pneumococcal 13-valent conjugate (PCV13) vaccine. One dose is recommended after age 89. Pneumococcal polysaccharide  (PPSV23) vaccine. One dose is recommended after age 32. Talk to your health care provider about which screenings and vaccines you need and how often you need them. This information is not intended to replace advice given to you by your health care provider. Make sure you discuss any questions you have with your health care provider. Document Released: 03/07/2015 Document Revised: 10/29/2015 Document Reviewed: 12/10/2014 Elsevier Interactive Patient Education  2017 [place] Prevention in the  Home Falls can cause injuries. They can happen to people of all ages. There are many things you can do to make your home safe and to help prevent falls. What can I do on the outside of my home? Regularly fix the edges of walkways and driveways and fix any cracks. Remove anything that might make you trip as you walk through a door, such as a raised step or threshold. Trim any bushes or trees on the path to your home. Use bright outdoor lighting. Clear any walking paths of anything that might make someone trip, such as rocks or tools. Regularly check to see if handrails are loose or broken. Make sure that both sides of any steps have handrails. Any raised decks and porches should have guardrails on the edges. Have any leaves, snow, or ice cleared regularly. Use sand or salt on walking paths during winter. Clean up any spills in your garage right away. This includes oil or grease spills. What can I do in the bathroom? Use night lights. Install grab bars by the toilet and in the tub and shower. Do not use towel bars as grab bars. Use non-skid mats or decals in the tub or shower. If you need to sit down in the shower, use a plastic, non-slip stool. Keep the floor dry. Clean up any water that spills on the floor as soon as it happens. Remove soap buildup in the tub or shower regularly. Attach bath mats securely with double-sided non-slip rug tape. Do not have throw rugs and other things on the  floor that can make you trip. What can I do in the bedroom? Use night lights. Make sure that you have a light by your bed that is easy to reach. Do not use any sheets or blankets that are too big for your bed. They should not hang down onto the floor. Have a firm chair that has side arms. You can use this for support while you get dressed. Do not have throw rugs and other things on the floor that can make you trip. What can I do in the kitchen? Clean up any spills right away. Avoid walking on wet floors. Keep items that you use a lot in easy-to-reach places. If you need to reach something above you, use a strong step stool that has a grab bar. Keep electrical cords out of the way. Do not use floor polish or wax that makes floors slippery. If you must use wax, use non-skid floor wax. Do not have throw rugs and other things on the floor that can make you trip. What can I do with my stairs? Do not leave any items on the stairs. Make sure that there are handrails on both sides of the stairs and use them. Fix handrails that are broken or loose. Make sure that handrails are as long as the stairways. Check any carpeting to make sure that it is firmly attached to the stairs. Fix any carpet that is loose or worn. Avoid having throw rugs at the top or bottom of the stairs. If you do have throw rugs, attach them to the floor with carpet tape. Make sure that you have a light switch at the top of the stairs and the bottom of the stairs. If you do not have them, ask someone to add them for you. What else can I do to help prevent falls? Wear shoes that: Do not have high heels. Have rubber bottoms. Are comfortable and fit you well. Are closed at  the toe. Do not wear sandals. If you use a stepladder: Make sure that it is fully opened. Do not climb a closed stepladder. Make sure that both sides of the stepladder are locked into place. Ask someone to hold it for you, if possible. Clearly mark and make  sure that you can see: Any grab bars or handrails. First and last steps. Where the edge of each step is. Use tools that help you move around (mobility aids) if they are needed. These include: Canes. Walkers. Scooters. Crutches. Turn on the lights when you go into a dark area. Replace any light bulbs as soon as they burn out. Set up your furniture so you have a clear path. Avoid moving your furniture around. If any of your floors are uneven, fix them. If there are any pets around you, be aware of where they are. Review your medicines with your doctor. Some medicines can make you feel dizzy. This can increase your chance of falling. Ask your doctor what other things that you can do to help prevent falls. This information is not intended to replace advice given to you by your health care provider. Make sure you discuss any questions you have with your health care provider. Document Released: 12/05/2008 Document Revised: 07/17/2015 Document Reviewed: 03/15/2014 Elsevier Interactive Patient Education  2017 [employer].

## 2022-05-27 NOTE — Progress Notes (Signed)
I connected with  Drew [name] on 05/27/22 by a audio enabled telemedicine application and verified that I am speaking with the correct person using two identifiers.  Patient Location: Home  Provider Location: Office/Clinic  I discussed the limitations of evaluation and management by telemedicine. The patient expressed understanding and agreed to proceed.  Subjective:   Drew [name] is a 82 y.o. male who presents for Medicare Annual/Subsequent preventive examination.  Review of Systems     Cardiac Risk Factors include: advanced age (>22men, >36 women);diabetes mellitus;dyslipidemia;hypertension;male gender;obesity (BMI >30kg/m2)     Objective:    Today's Vitals   05/27/22 1029  Weight: 216 lb (98 kg)  Height: 5\' 8"  (1.727 m)   Body mass index is 32.84 kg/m.     05/27/2022   10:43 AM 04/16/2021    2:29 PM 04/15/2020    2:23 PM 03/01/2019    2:14 PM 01/27/2018    2:16 PM 11/30/2016    8:41 AM 08/27/2016   10:02 AM  Advanced Directives  Does Patient Have a Medical Advance Directive? No No No Yes No No No  Type of Scientist, research (medical);Living will     Copy of [place] in Chart?    No - copy requested     Would patient like information on creating a medical advance directive?  Yes (MAU/Ambulatory/Procedural Areas - Information given) Yes (MAU/Ambulatory/Procedural Areas - Information given)  Yes (MAU/Ambulatory/Procedural Areas - Information given)      Current Medications (verified) Outpatient Encounter Medications as of 05/27/2022  Medication Sig   acetaminophen (TYLENOL) 500 MG tablet Take 500 mg by mouth every 6 (six) hours as needed.   albuterol (VENTOLIN HFA) 108 (90 Base) MCG/ACT inhaler SMARTSIG:2 Puff(s) By Mouth Every 4 Hours PRN   allopurinol (ZYLOPRIM) 100 MG tablet Take 100 mg by mouth 2 (two) times daily.   amLODipine (NORVASC) 5 MG tablet Take 0.5 tablets (2.5 mg total) by mouth daily.   aspirin EC 81 MG tablet Take 81  mg by mouth daily.   carvedilol (COREG) 3.125 MG tablet TAKE 1 TABLET BY MOUTH TWICE  DAILY   Cholecalciferol (VITAMIN D3) 25 MCG (1000 UT) CAPS Take 1,000 Units by mouth daily.   Coenzyme Q10 (CO Q 10) 100 MG CAPS Take 100 mg by mouth daily.   dapagliflozin propanediol (FARXIGA) 10 MG TABS tablet Take 1 tablet (10 mg total) by mouth daily before breakfast.   famotidine (PEPCID) 20 MG tablet TAKE 1 TABLET BY MOUTH DAILY   levothyroxine (SYNTHROID) 25 MCG tablet TAKE 1 TABLET BY MOUTH IN THE  MORNING   liraglutide (VICTOZA) 18 MG/3ML SOPN Inject 1.8 mg into the skin daily.   losartan (COZAAR) 25 MG tablet TAKE 1 TABLET BY MOUTH DAILY   Multiple Vitamins-Minerals (CENTRUM SILVER PO) Take 200 mg by mouth daily.   Needle, Disp, 32G X 5/16" MISC 6 mm by Does not apply route 2 (two) times daily.   Omega-3 Fatty Acids (FISH OIL) 1000 MG CAPS Take 1,000 mg by mouth daily.   rosuvastatin (CRESTOR) 20 MG tablet TAKE 1 TABLET BY MOUTH DAILY   traZODone (DESYREL) 50 MG tablet TAKE 1/2 TO 1 TABLET BY MOUTH AT BEDTIME AS NEEDED FOR SLEEP   triamcinolone cream (KENALOG) 0.1 % Apply 1 application topically 2 (two) times daily.   Turmeric 500 MG CAPS Take 500 mg by mouth daily.   nitroGLYCERIN (NITROSTAT) 0.4 MG SL tablet Place 1 tablet (0.4 mg  total) under the tongue every 5 (five) minutes as needed for chest pain.   No facility-administered encounter medications on file as of 05/27/2022.    Allergies (verified) Niacin and related   History: Past Medical History:  Diagnosis Date   (HFpEF) heart failure with preserved ejection fraction    a. 02/2016 Echo: EF nl. Gr1 DD. Mild AI. Mod thickened AoV w/o stenosis. Nl RV size and fxn.   Aortic atherosclerosis    CAD (coronary artery disease)    a. 12/2019 Cor CTA: LM nl, LAD 25-49p/m, LCX small, nondom, nl, RCA large, dom, 50-69p (nl FFR), 25-55m, Ca2+ = 1602 (79th%'ile)-->Med rx.   Chronic kidney disease, stage III (moderate) 08/15/2015   Diabetes mellitus  with complication    GERD (gastroesophageal reflux disease)    H/O: upper GI bleed 2015   Hiatal hernia    a. 12/2019 Moderate-sized HH incidentally noted on Cor CTA.   History of stress test    a. 05/2016 Lexiscan MV: EF 72%, No ischemia-->Low risk.   Hypercholesteremia    Hypertension    Obesity (BMI 30-39.9) 08/07/2015   Peripheral vascular disease 12/26/2015   Past Surgical History:  Procedure Laterality Date   CATARACT EXTRACTION, BILATERAL Bilateral 11/18/2017   COLONOSCOPY N/A 12/27/2013   Procedure: COLONOSCOPY;  Surgeon: Missy Sabins, MD;  Location: [place];  Service: Endoscopy;  Laterality: N/A;   ENTEROSCOPY N/A 12/21/2013   Procedure: ENTEROSCOPY;  Surgeon: Winfield Cunas., MD;  Location: [facility] ENDOSCOPY;  Service: Endoscopy;  Laterality: N/A;   ENTEROSCOPY N/A 12/27/2013   Procedure: ENTEROSCOPY;  Surgeon: Missy Sabins, MD;  Location: [facility] ENDOSCOPY;  Service: Endoscopy;  Laterality: N/A;   Family History  Problem Relation Age of Onset   Hyperlipidemia Father    Kidney disease Father    Heart disease Father    Congestive Heart Failure Mother    Heart disease Mother    Hyperlipidemia Daughter    Hyperlipidemia Son    Diabetes Son    Anxiety disorder Daughter    Depression Daughter    Arthritis Daughter    Social History   Socioeconomic History   Marital status: Married    Spouse name: Not on file   Number of children: 4   Years of education: Not on file   Highest education level: High school graduate  Occupational History   Occupation: retired  Tobacco Use   Smoking status: Former    Years: 1    Types: Cigarettes, Cigars    Quit date: 1990    Years since quitting: 34.2   Smokeless tobacco: Never   Tobacco comments:    Smoked afternoon cigar  Vaping Use   Vaping Use: Never used  Substance and Sexual Activity   Alcohol use: No   Drug use: No   Sexual activity: Yes  Other Topics Concern   Not on file  Social History Narrative   Not on file    Social Determinants of Health   Financial Resource Strain: Low Risk  (05/27/2022)   Overall Financial Resource Strain (CARDIA)    Difficulty of Paying Living Expenses: Not hard at all  Food Insecurity: No Food Insecurity (05/27/2022)   Hunger Vital Sign    Worried About Running Out of Food in the Last Year: Never true    [place] in the Last Year: Never true  Transportation Needs: No Transportation Needs (05/27/2022)   PRAPARE - Hydrologist (Medical): No  Lack of Transportation (Non-Medical): No  Physical Activity: Insufficiently Active (05/27/2022)   Exercise Vital Sign    Days of Exercise per Week: 2 days    Minutes of Exercise per Session: 40 min  Stress: No Stress Concern Present (05/27/2022)   [place]    Feeling of Stress : Not at all  Social Connections: Moderately Integrated (05/27/2022)   Social Connection and Isolation Panel [NHANES]    Frequency of Communication with Friends and Family: More than three times a week    Frequency of Social Gatherings with Friends and Family: More than three times a week    Attends Religious Services: More than 4 times per year    Active Member of [employer] or Organizations: No    Attends Music therapist: Never    Marital Status: Married    Tobacco Counseling Counseling given: Not Answered Tobacco comments: Smoked afternoon cigar   Clinical Intake:  Pre-visit preparation completed: Yes  Pain : No/denies pain     BMI - recorded: 32.84 Nutritional Risks: None Diabetes: Yes CBG done?: No Did pt. bring in CBG monitor from home?: No  How often do you need to have someone help you when you read instructions, pamphlets, or other written materials from your doctor or pharmacy?: 1 - Never  Diabetic?NO Interpreter Needed?: No Comments: lives with wife Information entered by :: B.Herberta Pickron,LPN   Activities of Daily  Living    05/27/2022   10:44 AM 04/26/2022   10:18 AM  In your present state of health, do you have any difficulty performing the following activities:  Hearing? 0 0  Vision? 0 0  Difficulty concentrating or making decisions? 0 0  Walking or climbing stairs? 0 0  Dressing or bathing?  0  Doing errands, shopping? 0 0  Preparing Food and eating ? N   Using the Toilet? N   In the past six months, have you accidently leaked urine? N   Do you have problems with loss of bowel control? N   Managing your Medications? N   Managing your Finances? N   Housekeeping or managing your Housekeeping? N     Patient Care Team: Steele Sizer, MD as PCP - General (Family Medicine) End, Harrell Gave, MD as PCP - Cardiology (Cardiology) Murlean Iba, MD (Nephrology) Germaine Pomfret, [facility] as Pharmacist (Pharmacist) Flora Lipps, MD as Consulting Physician (Pulmonary Disease) End, Harrell Gave, MD as Consulting Physician (Cardiology)  Indicate any recent Medical Services you may have received from other than [facility] providers in the past year (date may be approximate).     Assessment:   This is a routine wellness examination for Drew [name].  Hearing/Vision screen Hearing Screening - Comments:: Inadequate hearing-wants hearing test Vision Screening - Comments:: Adequate vision after cataract surgery [facility]  Dietary issues and exercise activities discussed: Current Exercise Habits: Home exercise routine, Type of exercise: walking, Time (Minutes): 30, Frequency (Times/Week): 3, Weekly Exercise (Minutes/Week): 90, Intensity: Mild, Exercise limited by: neurologic condition(s);cardiac condition(s)   Goals Addressed             This Visit's Progress    DIET - EAT MORE FRUITS AND VEGETABLES   On track    Recommend eating 3-4 servings of fruits and vegetables per day     Prevent falls   On track    Pt advised to install railing outside home for steps and grab bars in the shower.         Depression  Screen    05/27/2022   10:40 AM 04/26/2022   10:18 AM 03/18/2022   11:28 AM 10/19/2021    9:46 AM 06/18/2021   10:03 AM 04/16/2021    2:28 PM 02/17/2021    9:48 AM  PHQ 2/9 Scores  PHQ - 2 Score 0 0 0 0 0 0 0  PHQ- 9 Score 0 0 0 0 3 0 0    Fall Risk    05/27/2022   10:36 AM 04/26/2022   10:18 AM 03/18/2022   11:28 AM 10/19/2021    9:45 AM 06/18/2021   10:02 AM  Fall Risk   Falls in the past year? 1 1 0 1 0  Comment fell in yard in [place] no injuries      Number falls in past yr: 0 0  0   Injury with Fall? 0 0  0   Risk for fall due to : No Fall Risks History of fall(s) No Fall Risks No Fall Risks No Fall Risks  Follow up Education provided;Falls prevention discussed Falls prevention discussed;Falls evaluation completed;Education provided Falls prevention discussed Falls prevention discussed Falls prevention discussed;Education provided    FALL RISK PREVENTION PERTAINING TO THE HOME:  Any stairs in or around the home? Yes  If so, are there any without handrails? Yes  Home free of loose throw rugs in walkways, pet beds, electrical cords, etc? Yes  Adequate lighting in your home to reduce risk of falls? Yes   ASSISTIVE DEVICES UTILIZED TO PREVENT FALLS:  Life alert? No  Use of a cane, walker or w/c? No  Grab bars in the bathroom? No  Shower chair or bench in shower? No  Elevated toilet seat or a handicapped toilet? No   Cognitive Function:        05/27/2022   10:47 AM 04/15/2020    2:31 PM 03/01/2019    2:20 PM 01/27/2018    2:33 PM  6CIT Screen  What Year? 0 points 0 points 0 points 0 points  What month? 0 points 0 points 0 points 0 points  What time? 0 points 0 points 0 points 0 points  Count back from 20 0 points 0 points 0 points 0 points  Months in reverse 4 points 2 points 0 points 0 points  Repeat phrase 0 points 4 points 2 points 2 points  Total Score 4 points 6 points 2 points 2 points    Immunizations Immunization History  Administered Date(s)  Administered   Fluad Quad(high Dose 65+) 11/13/2019, 12/18/2020, 12/07/2021   Influenza, High Dose Seasonal PF 11/06/2014, 11/24/2015, 11/30/2016, 11/30/2017   Influenza-Unspecified 12/29/2018   PFIZER(Purple Top)SARS-COV-2 Vaccination 04/05/2019, 04/30/2019, 12/24/2019   Pneumococcal Conjugate-13 12/14/2013   Pneumococcal Polysaccharide-23 10/02/2009   Tdap 10/14/2010, 06/16/2020   Zoster Recombinat (Shingrix) 07/25/2012, 11/27/2020, 04/02/2021    TDAP status: Up to date  Flu Vaccine status: Up to date  Pneumococcal vaccine status: Up to date  Covid-19 vaccine status: Completed vaccines  Qualifies for Shingles Vaccine? Yes   Zostavax completed Yes   Shingrix Completed?: Yes  Screening Tests Health Maintenance  Topic Date Due   OPHTHALMOLOGY EXAM  06/24/2020   COVID-19 Vaccine (4 - 2023-24 season) 10/23/2021   Diabetic kidney evaluation - Urine ACR  06/19/2022   FOOT EXAM  06/19/2022   Diabetic kidney evaluation - eGFR measurement  07/08/2022   INFLUENZA VACCINE  09/23/2022   HEMOGLOBIN A1C  10/27/2022   Medicare Annual Wellness (AWV)  05/27/2023   DTaP/Tdap/Td (3 - Td or  Tdap) 06/17/2030   Pneumonia Vaccine 51+ Years old  Completed   Zoster Vaccines- Shingrix  Completed   HPV VACCINES  Aged Out    Health Maintenance  Health Maintenance Due  Topic Date Due   OPHTHALMOLOGY EXAM  06/24/2020   COVID-19 Vaccine (4 - 2023-24 season) 10/23/2021   Diabetic kidney evaluation - Urine ACR  06/19/2022    Colorectal cancer screening: No longer required.   Lung Cancer Screening: (Low Dose CT Chest recommended if Age 65-80 years, 30 pack-year currently smoking OR have quit w/in 15years.) does not qualify.   Lung Cancer Screening Referral: no  Additional Screening:  Hepatitis C Screening: does not qualify; Completed yes  Vision Screening: Recommended annual ophthalmology exams for early detection of glaucoma and other disorders of the eye. Is the patient up to date with  their annual eye exam?  Yes  Who is the provider or what is the name of the office in which the patient attends annual eye exams? [facility] If pt is not established with a provider, would they like to be referred to a provider to establish care? No .   Dental Screening: Recommended annual dental exams for proper oral hygiene  Community Resource Referral / Chronic Care Management: CRR required this visit?  No   CCM required this visit?  No      Plan:     I have personally reviewed and noted the following in the patient's chart:   Medical and social history Use of alcohol, tobacco or illicit drugs  Current medications and supplements including opioid prescriptions. Patient is not currently taking opioid prescriptions. Functional ability and status Nutritional status Physical activity Advanced directives List of other physicians Hospitalizations, surgeries, and ER visits in previous 12 months Vitals Screenings to include cognitive, depression, and falls Referrals and appointments  In addition, I have reviewed and discussed with patient certain preventive protocols, quality metrics, and best practice recommendations. A written personalized care plan for preventive services as well as general preventive health recommendations were provided to patient.     Roger Shelter, LPN   [id number]   Nurse Notes: The patient states they are doing well and has no concerns or questions at this time.

## 2022-06-16 DIAGNOSIS — E1122 Type 2 diabetes mellitus with diabetic chronic kidney disease: Secondary | ICD-10-CM | POA: Diagnosis not present

## 2022-06-16 DIAGNOSIS — E1142 Type 2 diabetes mellitus with diabetic polyneuropathy: Secondary | ICD-10-CM | POA: Diagnosis not present

## 2022-06-22 DIAGNOSIS — E1165 Type 2 diabetes mellitus with hyperglycemia: Secondary | ICD-10-CM | POA: Diagnosis not present

## 2022-06-23 ENCOUNTER — Other Ambulatory Visit: Payer: Self-pay | Admitting: Family Medicine

## 2022-06-23 ENCOUNTER — Ambulatory Visit: Payer: Medicare Other | Admitting: Internal Medicine

## 2022-06-23 DIAGNOSIS — N1831 Chronic kidney disease, stage 3a: Secondary | ICD-10-CM

## 2022-06-23 NOTE — Progress Notes (Unsigned)
Cardiology Office Note:    Date:  06/24/2022   ID:  Drew [name], DOB 10/26/1940, MRN [id number]  PCP:  Alba Cory, MD  [facility] Cardiologist:  Yvonne Kendall, MD  [facility] Electrophysiologist:  None   Referring MD: Alba Cory, MD   Chief Complaint: 6 month follow-up  History of Present Illness:    Drew [name] is a 82 y.o. male with a hx of HFpEF, OSA on CPAP, nonobstructive CAD, HTN, HLD, CKD stage , GERD, obesity who presents for 6 month follow-up.   He had prior coronary CTA 2021 showing extensive coronary artery calcification with moderate multivessel disease that was not significant by FFR.    Seen 11/20/20 and reported SOB and chest pain. Myoveiw and echo were ordered. Myoview was overall low risk with no evidence of ischemia, coronary artery calcifications noted in the LAD and RCA. Echo showed LVEF 55-60%, no WMA, G1DD, mild MR, mild AI.    Last seen 12/23/21 and reported occasional chest pain with exertion that may occur once a month. He was given 25 tablets SL NTG.  Today, the patient is overall doing well. He still does some gardening. He has occasional lightheadedness or dizziness. Says he has fallen due to tripping over his feet, this rarely happens.   Past Medical History:  Diagnosis Date   (HFpEF) heart failure with preserved ejection fraction (HCC)    a. 02/2016 Echo: EF nl. Gr1 DD. Mild AI. Mod thickened AoV w/o stenosis. Nl RV size and fxn.   Aortic atherosclerosis (HCC)    CAD (coronary artery disease)    a. 12/2019 Cor CTA: LM nl, LAD 25-49p/m, LCX small, nondom, nl, RCA large, dom, 50-69p (nl FFR), 25-72m, Ca2+ = 1602 (79th%'ile)-->Med rx.   Chronic kidney disease, stage III (moderate) (HCC) 08/15/2015   Diabetes mellitus with complication (HCC)    GERD (gastroesophageal reflux disease)    H/O: upper GI bleed 2015   Hiatal hernia    a. 12/2019 Moderate-sized HH incidentally noted on Cor CTA.   History of stress test    a. 05/2016 Lexiscan  MV: EF 72%, No ischemia-->Low risk.   Hypercholesteremia    Hypertension    Obesity (BMI 30-39.9) 08/07/2015   Peripheral vascular disease (HCC) 12/26/2015    Past Surgical History:  Procedure Laterality Date   CATARACT EXTRACTION, BILATERAL Bilateral 11/18/2017   COLONOSCOPY N/A 12/27/2013   Procedure: COLONOSCOPY;  Surgeon: Barrie Folk, MD;  Location: [facility] ENDOSCOPY;  Service: Endoscopy;  Laterality: N/A;   ENTEROSCOPY N/A 12/21/2013   Procedure: ENTEROSCOPY;  Surgeon: Vertell Novak., MD;  Location: [facility] ENDOSCOPY;  Service: Endoscopy;  Laterality: N/A;   ENTEROSCOPY N/A 12/27/2013   Procedure: ENTEROSCOPY;  Surgeon: Barrie Folk, MD;  Location: [facility] ENDOSCOPY;  Service: Endoscopy;  Laterality: N/A;    Current Medications: Current Meds  Medication Sig   acetaminophen (TYLENOL) 500 MG tablet Take 500 mg by mouth every 6 (six) hours as needed.   albuterol (VENTOLIN HFA) 108 (90 Base) MCG/ACT inhaler SMARTSIG:2 Puff(s) By Mouth Every 4 Hours PRN   allopurinol (ZYLOPRIM) 100 MG tablet Take 100 mg by mouth 2 (two) times daily.   amLODipine (NORVASC) 5 MG tablet Take 0.5 tablets (2.5 mg total) by mouth daily.   aspirin EC 81 MG tablet Take 81 mg by mouth daily.   carvedilol (COREG) 3.125 MG tablet TAKE 1 TABLET BY MOUTH TWICE  DAILY   Cholecalciferol (VITAMIN D3) 25 MCG (1000 UT) CAPS Take 1,000 Units by  mouth daily.   Coenzyme Q10 (CO Q 10) 100 MG CAPS Take 100 mg by mouth daily.   dapagliflozin propanediol (FARXIGA) 10 MG TABS tablet Take 1 tablet (10 mg total) by mouth daily before breakfast.   levothyroxine (SYNTHROID) 25 MCG tablet TAKE 1 TABLET BY MOUTH IN THE  MORNING   liraglutide (VICTOZA) 18 MG/3ML SOPN Inject 1.8 mg into the skin daily.   Multiple Vitamins-Minerals (CENTRUM SILVER PO) Take 200 mg by mouth daily.   Needle, Disp, 32G X 5/16" MISC 6 mm by Does not apply route 2 (two) times daily.   nitroGLYCERIN (NITROSTAT) 0.4 MG SL tablet Place 1 tablet (0.4 mg total) under the  tongue every 5 (five) minutes as needed for chest pain.   Omega-3 Fatty Acids (FISH OIL) 1000 MG CAPS Take 1,000 mg by mouth daily.   rosuvastatin (CRESTOR) 20 MG tablet TAKE 1 TABLET BY MOUTH DAILY   traZODone (DESYREL) 50 MG tablet TAKE 1/2 TO 1 TABLET BY MOUTH AT BEDTIME AS NEEDED FOR SLEEP   triamcinolone cream (KENALOG) 0.1 % Apply 1 application topically 2 (two) times daily.   Turmeric 500 MG CAPS Take 500 mg by mouth daily.   [DISCONTINUED] losartan (COZAAR) 25 MG tablet TAKE 1 TABLET BY MOUTH DAILY     Allergies:   Niacin and related   Social History   Socioeconomic History   Marital status: Married    Spouse name: Not on file   Number of children: 4   Years of education: Not on file   Highest education level: High school graduate  Occupational History   Occupation: retired  Tobacco Use   Smoking status: Former    Years: 1    Types: Cigarettes, Cigars    Quit date: 1990    Years since quitting: 34.3   Smokeless tobacco: Never   Tobacco comments:    Smoked afternoon cigar  Vaping Use   Vaping Use: Never used  Substance and Sexual Activity   Alcohol use: No   Drug use: No   Sexual activity: Yes  Other Topics Concern   Not on file  Social History Narrative   Not on file   Social Determinants of Health   Financial Resource Strain: Low Risk  (05/27/2022)   Overall Financial Resource Strain (CARDIA)    Difficulty of Paying Living Expenses: Not hard at all  Food Insecurity: No Food Insecurity (05/27/2022)   Hunger Vital Sign    Worried About Running Out of Food in the Last Year: Never true    Ran Out of Food in the Last Year: Never true  Transportation Needs: No Transportation Needs (05/27/2022)   PRAPARE - Administrator, Civil Service (Medical): No    Lack of Transportation (Non-Medical): No  Physical Activity: Insufficiently Active (05/27/2022)   Exercise Vital Sign    Days of Exercise per Week: 2 days    Minutes of Exercise per Session: 40 min   Stress: No Stress Concern Present (05/27/2022)   [employer] of Occupational Health - Occupational Stress Questionnaire    Feeling of Stress : Not at all  Social Connections: Moderately Integrated (05/27/2022)   Social Connection and Isolation Panel [NHANES]    Frequency of Communication with Friends and Family: More than three times a week    Frequency of Social Gatherings with Friends and Family: More than three times a week    Attends Religious Services: More than 4 times per year    Active Member of Clubs or  Organizations: No    Attends Banker Meetings: Never    Marital Status: Married     Family History: The patient's family history includes Anxiety disorder in his daughter; Arthritis in his daughter; Congestive Heart Failure in his mother; Depression in his daughter; Diabetes in his son; Heart disease in his father and mother; Hyperlipidemia in his daughter, father, and son; Kidney disease in his father.  ROS:   Please see the history of present illness.     All other systems reviewed and are negative.  EKGs/Labs/Other Studies Reviewed:    The following studies were reviewed today:  Echo 12/2020  1. Left ventricular ejection fraction, by estimation, is 55 to 60%. The  left ventricle has normal function. The left ventricle has no regional  wall motion abnormalities. Left ventricular diastolic parameters are  consistent with Grade I diastolic  dysfunction (impaired relaxation).   2. Right ventricular systolic function is normal. The right ventricular  size is normal. There is normal pulmonary artery systolic pressure. The  estimated right ventricular systolic pressure is 24.9 mmHg.   3. The mitral valve is normal in structure. Mild mitral valve  regurgitation. No evidence of mitral stenosis.   4. The aortic valve was not well visualized. Aortic valve regurgitation  is mild. Mild to moderate aortic valve sclerosis/calcification is present,  without any  evidence of aortic stenosis.   5. The inferior vena cava is normal in size with greater than 50%  respiratory variability, suggesting right atrial pressure of 3 mmHg.   Comparison(s): LVEF 55-60%.    Myoview Lexiscan 11/2020   . The study is low risk.   There is no evidence for ischemia.   LV perfusion is normal. There is no evidence of ischemia.   The left ventricular ejection fraction is normal (55-65%).   Coronary artery calcifications noted in the LAD and RCA  EKG:  EKG is ordered today.  The ekg ordered today demonstrates normal sinus rhythm, 63 bpm, LAD, diffuse T wave flattening  Recent Labs: 07/07/2021: BUN 17; Creatinine, Ser 1.20; Potassium 4.1; Sodium 140  Recent Lipid Panel    Component Value Date/Time   CHOL 102 06/18/2021 1052   TRIG 197 (H) 06/18/2021 1052   HDL 32 (L) 06/18/2021 1052   CHOLHDL 3.2 06/18/2021 1052   VLDL 41 (H) 05/19/2020 0929   LDLCALC 43 06/18/2021 1052     Physical Exam:    VS:  BP 120/76 (BP Location: Left Arm, Patient Position: Sitting, Cuff Size: Normal)   Pulse 63   Ht 5\' 8"  (1.727 m)   Wt 218 lb 6.4 oz (99.1 kg)   SpO2 95%   BMI 33.21 kg/m     Wt Readings from Last 3 Encounters:  06/24/22 218 lb 6.4 oz (99.1 kg)  05/27/22 216 lb (98 kg)  04/26/22 216 lb 6.4 oz (98.2 kg)     GEN:  Well nourished, well developed in no acute distress HEENT: Normal NECK: No JVD; No carotid bruits LYMPHATICS: No lymphadenopathy CARDIAC: RRR, no murmurs, rubs, gallops RESPIRATORY:  Clear to auscultation without rales, wheezing or rhonchi  ABDOMEN: Soft, non-tender, non-distended MUSCULOSKELETAL:  No edema; No deformity  SKIN: Warm and dry NEUROLOGIC:  Alert and oriented x 3 PSYCHIATRIC:  Normal affect   ASSESSMENT:    1. Orthostasis   2. Essential hypertension   3. Coronary artery disease of native artery of native heart with stable angina pectoris (HCC)   4. Chronic diastolic heart failure (HCC)   5.  Hyperlipidemia, mixed   6. OSA  (obstructive sleep apnea)    PLAN:    In order of problems listed above:  Orthostasis Hypertension Patient reports orthostatic symptoms.  Patient is on amlodipine 5 mg daily, Coreg 3.125 mg daily and losartan 25 mg daily.  Orthostatics today were negative, but there was a drop from sitting to standing. I will decrease Losartan to 12.5mg  daily.   Moderate CAD Patient denies any significant anginal symptoms.  He has moderate CAD by cardiac CTA in November 2021 with negative FFR.  Subsequent Myoview was unremarkable.  EKG today shows sinus rhythm, with no ischemic changes.  Continue aspirin 81 mg daily Coreg 3.125 mg twice a day, losartan 12.5 mg daily and Crestor.  HFpEF The patient is euvolemic on exam. Echo 12/2020 showed LVEF 55-60%, G1DD. Continue Coreg and losartan.  LDL LDL 43.  Continue Crestor 20 mg daily.  OSA Patient reports compliance with CPAP.  Disposition: Follow up in 6 month(s) with MD    Signed, Jamison Yuhasz David Stall, PA-C  06/24/2022 10:19 AM    [facility]

## 2022-06-24 ENCOUNTER — Ambulatory Visit: Payer: Medicare Other | Attending: Internal Medicine | Admitting: Medical

## 2022-06-24 ENCOUNTER — Encounter: Payer: Self-pay | Admitting: Medical

## 2022-06-24 VITALS — BP 120/76 | HR 63 | Ht 68.0 in | Wt 218.4 lb

## 2022-06-24 DIAGNOSIS — I5032 Chronic diastolic (congestive) heart failure: Secondary | ICD-10-CM | POA: Diagnosis not present

## 2022-06-24 DIAGNOSIS — E782 Mixed hyperlipidemia: Secondary | ICD-10-CM

## 2022-06-24 DIAGNOSIS — I951 Orthostatic hypotension: Secondary | ICD-10-CM | POA: Diagnosis not present

## 2022-06-24 DIAGNOSIS — I25118 Atherosclerotic heart disease of native coronary artery with other forms of angina pectoris: Secondary | ICD-10-CM

## 2022-06-24 DIAGNOSIS — I10 Essential (primary) hypertension: Secondary | ICD-10-CM

## 2022-06-24 DIAGNOSIS — G4733 Obstructive sleep apnea (adult) (pediatric): Secondary | ICD-10-CM | POA: Diagnosis not present

## 2022-06-24 MED ORDER — LOSARTAN POTASSIUM 25 MG PO TABS: 12.5000 mg | ORAL_TABLET | Freq: Every day | ORAL | 0 refills | Status: DC

## 2022-06-24 NOTE — Patient Instructions (Signed)
Medication Instructions:  Your physician has recommended you make the following change in your medication:  START - losartan (COZAAR) 25 MG tablet - Take 0.5 tablets (12.5 mg total) by mouth daily  *If you need a refill on your cardiac medications before your next appointment, please call your pharmacy*   Lab Work: -None ordered If you have labs (blood work) drawn today and your tests are completely normal, you will receive your results only by: MyChart Message (if you have MyChart) OR A paper copy in the mail If you have any lab test that is abnormal or we need to change your treatment, we will call you to review the results.   Testing/Procedures: -None ordered  Follow-Up: At [facility], you and your health needs are our priority.  As part of our continuing mission to provide you with exceptional heart care, we have created designated Provider Care Teams.  These Care Teams include your primary Cardiologist (physician) and Advanced Practice Providers (APPs -  Physician Assistants and Nurse Practitioners) who all work together to provide you with the care you need, when you need it.  We recommend signing up for the patient portal called "MyChart".  Sign up information is provided on this After Visit Summary.  MyChart is used to connect with patients for Virtual Visits (Telemedicine).  Patients are able to view lab/test results, encounter notes, upcoming appointments, etc.  Non-urgent messages can be sent to your provider as well.   To learn more about what you can do with MyChart, go to [email or web address].    Your next appointment:   6 month(s)  Provider:   Yvonne Kendall, MD    Other Instructions -None

## 2022-07-02 DIAGNOSIS — E1165 Type 2 diabetes mellitus with hyperglycemia: Secondary | ICD-10-CM | POA: Diagnosis not present

## 2022-07-16 DIAGNOSIS — E1159 Type 2 diabetes mellitus with other circulatory complications: Secondary | ICD-10-CM | POA: Diagnosis not present

## 2022-07-16 DIAGNOSIS — E1122 Type 2 diabetes mellitus with diabetic chronic kidney disease: Secondary | ICD-10-CM | POA: Diagnosis not present

## 2022-07-23 DIAGNOSIS — E1165 Type 2 diabetes mellitus with hyperglycemia: Secondary | ICD-10-CM | POA: Diagnosis not present

## 2022-07-24 ENCOUNTER — Other Ambulatory Visit: Payer: Self-pay | Admitting: Internal Medicine

## 2022-08-02 ENCOUNTER — Other Ambulatory Visit
Admission: RE | Admit: 2022-08-02 | Discharge: 2022-08-02 | Disposition: A | Discharge status: Home / Self Care | Payer: Medicare Other | Source: Ambulatory Visit | Attending: Nephrology | Admitting: Nephrology

## 2022-08-02 ENCOUNTER — Inpatient Hospital Stay: Admit: 2022-08-02 | Payer: Medicare Other

## 2022-08-02 DIAGNOSIS — I10 Essential (primary) hypertension: Secondary | ICD-10-CM | POA: Diagnosis not present

## 2022-08-02 DIAGNOSIS — I129 Hypertensive chronic kidney disease with stage 1 through stage 4 chronic kidney disease, or unspecified chronic kidney disease: Secondary | ICD-10-CM | POA: Diagnosis not present

## 2022-08-02 DIAGNOSIS — N281 Cyst of kidney, acquired: Secondary | ICD-10-CM | POA: Diagnosis not present

## 2022-08-02 DIAGNOSIS — N261 Atrophy of kidney (terminal): Secondary | ICD-10-CM | POA: Diagnosis not present

## 2022-08-02 DIAGNOSIS — E1122 Type 2 diabetes mellitus with diabetic chronic kidney disease: Secondary | ICD-10-CM | POA: Insufficient documentation

## 2022-08-02 DIAGNOSIS — E1129 Type 2 diabetes mellitus with other diabetic kidney complication: Secondary | ICD-10-CM | POA: Diagnosis not present

## 2022-08-02 DIAGNOSIS — N1831 Chronic kidney disease, stage 3a: Secondary | ICD-10-CM | POA: Insufficient documentation

## 2022-08-02 LAB — RENAL FUNCTION PANEL
Albumin: 4.3 g/dL (ref 3.5–5.0)
Anion gap: 11 (ref 5–15)
BUN: 19 mg/dL (ref 8–23)
CO2: 22 mmol/L (ref 22–32)
Calcium: 9.6 mg/dL (ref 8.9–10.3)
Chloride: 106 mmol/L (ref 98–111)
Creatinine, Ser: 1.36 mg/dL — ABNORMAL HIGH (ref 0.61–1.24)
GFR, Estimated: 52 mL/min — ABNORMAL LOW (ref >60)
Glucose, Bld: 95 mg/dL (ref 70–99)
Phosphorus: 3.6 mg/dL (ref 2.5–4.6)
Potassium: 4.1 mmol/L (ref 3.5–5.1)
Sodium: 139 mmol/L (ref 135–145)

## 2022-08-02 LAB — CBC
HCT: 43.6 % (ref 39.0–52.0)
Hemoglobin: 14.6 g/dL (ref 13.0–17.0)
MCH: 33 pg (ref 26.0–34.0)
MCHC: 33.5 g/dL (ref 30.0–36.0)
MCV: 98.6 fL (ref 80.0–100.0)
Platelets: 143 10*3/uL — ABNORMAL LOW (ref 150–400)
RBC: 4.42 MIL/uL (ref 4.22–5.81)
RDW: 13.2 % (ref 11.5–15.5)
WBC: 6.7 10*3/uL (ref 4.0–10.5)
nRBC: 0 % (ref 0.0–0.2)

## 2022-08-02 LAB — URINALYSIS, COMPLETE (UACMP) WITH MICROSCOPIC
Bacteria, UA: NONE SEEN
Bilirubin Urine: NEGATIVE
Glucose, UA: NEGATIVE mg/dL
Hgb urine dipstick: NEGATIVE
Ketones, ur: NEGATIVE mg/dL
Leukocytes,Ua: NEGATIVE
Nitrite: NEGATIVE
Protein, ur: NEGATIVE mg/dL
Specific Gravity, Urine: 1.011 (ref 1.005–1.030)
Squamous Epithelial / HPF: NONE SEEN /HPF (ref 0–5)
pH: 6 (ref 5.0–8.0)

## 2022-08-02 LAB — URIC ACID: Uric Acid, Serum: 5.9 mg/dL (ref 3.7–8.6)

## 2022-08-02 LAB — PROTEIN / CREATININE RATIO, URINE
Creatinine, Urine: 85 mg/dL
Total Protein, Urine: <6 mg/dL

## 2022-08-02 LAB — MAGNESIUM: Magnesium: 2.3 mg/dL (ref 1.7–2.4)

## 2022-08-03 LAB — PARATHYROID HORMONE, INTACT (NO CA): PTH: 16 pg/mL (ref 15–65)

## 2022-08-05 ENCOUNTER — Other Ambulatory Visit: Payer: Self-pay | Admitting: Family Medicine

## 2022-08-05 DIAGNOSIS — E034 Atrophy of thyroid (acquired): Secondary | ICD-10-CM

## 2022-08-10 ENCOUNTER — Other Ambulatory Visit: Payer: Self-pay | Admitting: Family Medicine

## 2022-08-10 DIAGNOSIS — G4709 Other insomnia: Secondary | ICD-10-CM

## 2022-08-14 ENCOUNTER — Other Ambulatory Visit: Payer: Self-pay | Admitting: Internal Medicine

## 2022-08-14 ENCOUNTER — Other Ambulatory Visit: Payer: Self-pay | Admitting: Family Medicine

## 2022-08-14 DIAGNOSIS — Z8711 Personal history of peptic ulcer disease: Secondary | ICD-10-CM

## 2022-08-14 DIAGNOSIS — I152 Hypertension secondary to endocrine disorders: Secondary | ICD-10-CM

## 2022-08-17 DIAGNOSIS — E1122 Type 2 diabetes mellitus with diabetic chronic kidney disease: Secondary | ICD-10-CM | POA: Diagnosis not present

## 2022-08-22 ENCOUNTER — Other Ambulatory Visit: Payer: Self-pay | Admitting: Family Medicine

## 2022-08-22 DIAGNOSIS — E034 Atrophy of thyroid (acquired): Secondary | ICD-10-CM

## 2022-08-22 DIAGNOSIS — E1165 Type 2 diabetes mellitus with hyperglycemia: Secondary | ICD-10-CM | POA: Diagnosis not present

## 2022-08-25 NOTE — Progress Notes (Signed)
Name: Drew [name]   MRN: [id number]    DOB: [birth year]   Date:08/30/2022       Progress Note  Subjective  Chief Complaint  Follow Up  HPI  DMII: he has obesity, dyslipidemia, HTN and CKI. His weight is going down, but BMI is now below 35, A1C had  gone from 6.2 % to 6.9 %, 6.3% , 6.4% , 6.5 %, 6.4 % ,  6 % , 6.2 %  and today is down to 5.9 %  He is still taking Victoza daily and denies hypoglycemic episodes glucose,  he states glucose has been controlled, he is also focusing on a diabetic diet .  Denies polyphagia, polyuria or polydipsia.  He is on ARB and statin therapy. He denies side effects of medication .Reminded him again to have a repeat eye exam   Thrombocytopenia: we will continue to monitor, labs recently done by Dr. Thedore Mins   OSA: he is complaint with CPAP and states doing well, he occasionally still wakes up with a headache. Unchanged   HTN/CHF with preserved EF : bp is towards low end of normal again, wife brought all medications and he is taking losartan 25 mg half pill , carvedilol 3.125 mg BID and norvasc 2.5  mg. He has intermittent chest pain He sees Dr. Okey Dupre - cardiologist , no orthopnea, wheezing but he has intermittent sob with moderate activity that has been stable . He has intermittent lower extremity edema .He is now on [place] and tolerating it well   Dyslipidemia: he is doing well on medication, he is compliant, last LDL was down to 44. He is under the care of Dr. Okey Dupre.  We will recheck labs and hepatic function test   Stable Angina/Atherosclerosis aorta : he has intermittent chest pain but intermittent only,  under the care of Dr. Okey Dupre he does not have NTG at home. He states he has been doing well lately, taking higher dose of Crestor and aspirin daily, he is also on a beta blocker . He states only has chest pain when active in his yard but resolves with rest   Obesity  BMI is now below  35 , Weight stable since last visit   Controlled gout: on allopurinol , no recent  episodes, uric acid below 6. 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 Thedore Mins, no pruritus, good urine output, last GFR was 52 and stable. BP is good with lower dose of norvasc   Hypothyroidism: on the same dose of levothyroxine , denies dysphagia, dry skin or change in bowel movements, last TSH at goal , however today we found out he was taking famotidine with levothyroxine at night, he will switch levothyroxine to the morning again   Patient Active Problem List   Diagnosis Date Noted   Controlled gout 10/19/2021   Diabetic polyneuropathy associated with type 2 diabetes mellitus (HCC) 10/19/2021   Insomnia 05/12/2021   Coronary artery disease of native artery of native heart with stable angina pectoris (HCC) 08/14/2020   Aortic atherosclerosis (HCC) 08/14/2020   Atrophy of kidney 10/10/2019   Edema of lower extremity 10/10/2019   Thrombocytopenia (HCC) 08/13/2019   Diabetes mellitus type 2 in obese 08/13/2019   Hyperlipidemia associated with type 2 diabetes mellitus (HCC) 08/13/2019   Neuropathy involving both lower extremities 05/04/2019   Polyneuropathy, unspecified 05/04/2019   Multiple renal cysts 04/09/2019   Stable angina 12/29/2018   Onychomycosis of multiple toenails with type 2 diabetes mellitus (HCC) 11/30/2017  Chronic heart failure with preserved ejection fraction (HCC) 10/20/2017   OSA on CPAP 02/10/2017   Abnormal ankle brachial index (ABI) 11/26/2015   Stage 3a chronic kidney disease (HCC) 08/15/2015   H/O: upper GI bleed    Hypothyroidism due to acquired atrophy of thyroid 03/11/2015   History of prostatitis 11/06/2014   Hypertension associated with type 2 diabetes mellitus (HCC) 11/06/2014   Mixed hyperlipidemia 11/06/2014   Flat feet, bilateral 11/06/2014   Chronic multiple gastric erosions 11/06/2014   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) 11/06/2014    Past Surgical History:  Procedure Laterality Date    CATARACT EXTRACTION, BILATERAL Bilateral 11/18/2017   COLONOSCOPY N/A 12/27/2013   Procedure: COLONOSCOPY;  Surgeon: Barrie Folk, MD;  Location: [facility] ENDOSCOPY;  Service: Endoscopy;  Laterality: N/A;   ENTEROSCOPY N/A 12/21/2013   Procedure: ENTEROSCOPY;  Surgeon: Vertell Novak., MD;  Location: [facility] ENDOSCOPY;  Service: Endoscopy;  Laterality: N/A;   ENTEROSCOPY N/A 12/27/2013   Procedure: ENTEROSCOPY;  Surgeon: Barrie Folk, MD;  Location: [facility] ENDOSCOPY;  Service: Endoscopy;  Laterality: N/A;    Family History  Problem Relation Age of Onset   Hyperlipidemia Father    Kidney disease Father    Heart disease Father    Congestive Heart Failure Mother    Heart disease Mother    Hyperlipidemia Daughter    Hyperlipidemia Son    Diabetes Son    Anxiety disorder Daughter    Depression Daughter    Arthritis Daughter     Social History   Tobacco Use   Smoking status: Former    Years: 1    Types: Cigarettes, Cigars    Quit date: 1990    Years since quitting: 34.5   Smokeless tobacco: Never   Tobacco comments:    Smoked afternoon cigar  Substance Use Topics   Alcohol use: No     Current Outpatient Medications:    acetaminophen (TYLENOL) 500 MG tablet, Take 500 mg by mouth every 6 (six) hours as needed., Disp: , Rfl:    albuterol (VENTOLIN HFA) 108 (90 Base) MCG/ACT inhaler, SMARTSIG:2 Puff(s) By Mouth Every 4 Hours PRN, Disp: , Rfl:    allopurinol (ZYLOPRIM) 100 MG tablet, Take 100 mg by mouth 2 (two) times daily., Disp: , Rfl:    amLODipine (NORVASC) 5 MG tablet, Take 0.5 tablets (2.5 mg total) by mouth daily., Disp: 1 tablet, Rfl: 0   aspirin EC 81 MG tablet, Take 81 mg by mouth daily., Disp: , Rfl:    carvedilol (COREG) 3.125 MG tablet, TAKE 1 TABLET BY MOUTH TWICE  DAILY, Disp: 180 tablet, Rfl: 1   Cholecalciferol (VITAMIN D3) 25 MCG (1000 UT) CAPS, Take 1,000 Units by mouth daily., Disp: , Rfl:    Coenzyme Q10 (CO Q 10) 100 MG CAPS, Take 100 mg by mouth daily., Disp: , Rfl:     dapagliflozin propanediol (FARXIGA) 10 MG TABS tablet, Take 1 tablet (10 mg total) by mouth daily before breakfast., Disp: 90 tablet, Rfl: 3   levothyroxine (SYNTHROID) 25 MCG tablet, TAKE 1 TABLET BY MOUTH IN THE  MORNING, Disp: 30 tablet, Rfl: 0   liraglutide (VICTOZA) 18 MG/3ML SOPN, Inject 1.8 mg into the skin daily., Disp: 9 mL, Rfl: 3   losartan (COZAAR) 25 MG tablet, Take 0.5 tablets (12.5 mg total) by mouth daily., Disp: 90 tablet, Rfl: 0   Multiple Vitamins-Minerals (CENTRUM SILVER PO), Take 200 mg by mouth daily., Disp: , Rfl:  Needle, Disp, 32G X 5/16" MISC, 6 mm by Does not apply route 2 (two) times daily., Disp: 100 each, Rfl: 3   Omega-3 Fatty Acids (FISH OIL) 1000 MG CAPS, Take 1,000 mg by mouth daily., Disp: , Rfl:    rosuvastatin (CRESTOR) 20 MG tablet, TAKE 1 TABLET BY MOUTH DAILY, Disp: 100 tablet, Rfl: 2   traZODone (DESYREL) 50 MG tablet, TAKE 1/2 TO 1 TABLET BY MOUTH AT BEDTIME AS NEEDED FOR SLEEP, Disp: 90 tablet, Rfl: 3   triamcinolone cream (KENALOG) 0.1 %, Apply 1 application topically 2 (two) times daily., Disp: 30 g, Rfl: 0   Turmeric 500 MG CAPS, Take 500 mg by mouth daily., Disp: , Rfl:    famotidine (PEPCID) 20 MG tablet, TAKE 1 TABLET BY MOUTH DAILY (Patient not taking: Reported on 08/30/2022), Disp: 90 tablet, Rfl: 1   nitroGLYCERIN (NITROSTAT) 0.4 MG SL tablet, Place 1 tablet (0.4 mg total) under the tongue every 5 (five) minutes as needed for chest pain., Disp: 25 tablet, Rfl: 0  Allergies  Allergen Reactions   Niacin And Related Itching and Rash    I personally reviewed active problem list, medication list, allergies, family history, social history, health maintenance with the patient/caregiver today.   ROS  Ten systems reviewed and is negative except as mentioned in HPI   Objective  Vitals:   08/30/22 1039  BP: 118/80  Pulse: 69  Resp: 16  Temp: 97.8 F (36.6 C)  TempSrc: Oral  SpO2: 93%  Weight: 217 lb 14.4 oz (98.8 kg)  Height: 5\' 8"   (1.727 m)    Body mass index is 33.13 kg/m.  Physical Exam  Constitutional: Patient appears well-developed and well-nourished. Obese  No distress.  HEENT: head atraumatic, normocephalic, pupils equal and reactive to light,, neck supple Cardiovascular: Normal rate, regular rhythm and normal heart sounds.  No murmur heard. No BLE edema. Pulmonary/Chest: Effort normal and breath sounds normal. No respiratory distress. Abdominal: Soft.  There is no tenderness. Psychiatric: Patient has a normal mood and affect. behavior is normal. Judgment and thought content normal.    Diabetic Foot Exam: Diabetic Foot Exam - Simple   Simple Foot Form Visual Inspection See comments: Yes Sensation Testing Intact to touch and monofilament testing bilaterally: Yes Pulse Check Posterior Tibialis and Dorsalis pulse intact bilaterally: Yes Comments Corn on firs right toe      PHQ2/9:    08/30/2022   10:42 AM 05/27/2022   10:40 AM 04/26/2022   10:18 AM 03/18/2022   11:28 AM 10/19/2021    9:46 AM  Depression screen PHQ 2/9  Decreased Interest 0 0 0 0 0  Down, Depressed, Hopeless 0 0 0 0 0  PHQ - 2 Score 0 0 0 0 0  Altered sleeping 0 0 0 0 0  Tired, decreased energy 0 0 0 0 0  Change in appetite 0  0 0 0  Feeling bad or failure about yourself  0 0 0 0 0  Trouble concentrating 0 0 0 0 0  Moving slowly or fidgety/restless 0 0 0 0 0  Suicidal thoughts 0 0 0 0 0  PHQ-9 Score 0 0 0 0 0    phq 9 is negative   Fall Risk:    08/30/2022   10:41 AM 05/27/2022   10:36 AM 04/26/2022   10:18 AM 03/18/2022   11:28 AM 10/19/2021    9:45 AM  Fall Risk   Falls in the past year? 0 1 1 0 1  Comment  fell in yard in [place] no injuries     Number falls in past yr:  0 0  0  Injury with Fall?  0 0  0  Risk for fall due to : No Fall Risks No Fall Risks History of fall(s) No Fall Risks No Fall Risks  Follow up Falls prevention discussed Education provided;Falls prevention discussed Falls prevention discussed;Falls  evaluation completed;Education provided Falls prevention discussed Falls prevention discussed      Functional Status Survey: Is the patient deaf or have difficulty hearing?: No Does the patient have difficulty seeing, even when wearing glasses/contacts?: No Does the patient have difficulty concentrating, remembering, or making decisions?: No Does the patient have difficulty walking or climbing stairs?: No Does the patient have difficulty dressing or bathing?: No Does the patient have difficulty doing errands alone such as visiting a doctor's office or shopping?: No    Assessment & Plan  1. Type 2 diabetes mellitus with stage 3a chronic kidney disease, without long-term current use of insulin (HCC)  - POCT HgB A1C - HM Diabetes Foot Exam - Ambulatory referral to Ophthalmology  2. Thrombocytopenia (HCC)  Last level improved  3. Aortic atherosclerosis (HCC)  - Lipid panel  4. Chronic heart failure with preserved ejection fraction (HFpEF) (HCC)  Stable  5. Stage 3a chronic kidney disease (HCC)  Keep follow up with nephrologist   6. Diabetic polyneuropathy associated with type 2 diabetes mellitus (HCC)   7. Stable angina  Keep follow with   8. Hypothyroidism due to acquired atrophy of thyroid  - TSH  9. Controlled gout  Continue medication   10. OSA on CPAP  Continue wearing CPAP every night  11. Long-term use of high-risk medication  - Hepatic function panel

## 2022-08-30 ENCOUNTER — Encounter: Payer: Self-pay | Admitting: Family Medicine

## 2022-08-30 ENCOUNTER — Ambulatory Visit (INDEPENDENT_AMBULATORY_CARE_PROVIDER_SITE_OTHER): Payer: Medicare Other | Admitting: Family Medicine

## 2022-08-30 VITALS — BP 118/80 | HR 69 | Temp 97.8°F | Resp 16 | Ht 68.0 in | Wt 217.9 lb

## 2022-08-30 DIAGNOSIS — D696 Thrombocytopenia, unspecified: Secondary | ICD-10-CM

## 2022-08-30 DIAGNOSIS — G4733 Obstructive sleep apnea (adult) (pediatric): Secondary | ICD-10-CM | POA: Diagnosis not present

## 2022-08-30 DIAGNOSIS — E1142 Type 2 diabetes mellitus with diabetic polyneuropathy: Secondary | ICD-10-CM | POA: Diagnosis not present

## 2022-08-30 DIAGNOSIS — E034 Atrophy of thyroid (acquired): Secondary | ICD-10-CM | POA: Diagnosis not present

## 2022-08-30 DIAGNOSIS — N1831 Chronic kidney disease, stage 3a: Secondary | ICD-10-CM

## 2022-08-30 DIAGNOSIS — M109 Gout, unspecified: Secondary | ICD-10-CM | POA: Diagnosis not present

## 2022-08-30 DIAGNOSIS — Z79899 Other long term (current) drug therapy: Secondary | ICD-10-CM

## 2022-08-30 DIAGNOSIS — I700 Atherosclerosis of aorta: Secondary | ICD-10-CM | POA: Diagnosis not present

## 2022-08-30 DIAGNOSIS — Z7985 Long-term (current) use of injectable non-insulin antidiabetic drugs: Secondary | ICD-10-CM

## 2022-08-30 DIAGNOSIS — I2089 Other forms of angina pectoris: Secondary | ICD-10-CM

## 2022-08-30 DIAGNOSIS — I5032 Chronic diastolic (congestive) heart failure: Secondary | ICD-10-CM | POA: Diagnosis not present

## 2022-08-30 DIAGNOSIS — E1122 Type 2 diabetes mellitus with diabetic chronic kidney disease: Secondary | ICD-10-CM | POA: Diagnosis not present

## 2022-08-30 LAB — POCT GLYCOSYLATED HEMOGLOBIN (HGB A1C): Hemoglobin A1C: 5.9 % — AB (ref 4.0–5.6)

## 2022-08-31 LAB — LIPID PANEL
Cholesterol: 98 mg/dL (ref <200)
HDL: 30 mg/dL — ABNORMAL LOW (ref >=40)
LDL Cholesterol (Calc): 28 mg/dL (calc)
Non-HDL Cholesterol (Calc): 68 mg/dL (calc) (ref <130)
Total CHOL/HDL Ratio: 3.3 (calc) (ref <5.0)
Triglycerides: 379 mg/dL — ABNORMAL HIGH (ref <150)

## 2022-08-31 LAB — HEPATIC FUNCTION PANEL
AG Ratio: 1.9 (calc) (ref 1.0–2.5)
ALT: 52 U/L — ABNORMAL HIGH (ref 9–46)
AST: 41 U/L — ABNORMAL HIGH (ref 10–35)
Albumin: 4 g/dL (ref 3.6–5.1)
Alkaline phosphatase (APISO): 59 U/L (ref 35–144)
Bilirubin, Direct: 0.1 mg/dL (ref 0.0–0.2)
Globulin: 2.1 g/dL (calc) (ref 1.9–3.7)
Indirect Bilirubin: 0.6 mg/dL (calc) (ref 0.2–1.2)
Total Bilirubin: 0.7 mg/dL (ref 0.2–1.2)
Total Protein: 6.1 g/dL (ref 6.1–8.1)

## 2022-08-31 LAB — TSH: TSH: 2.49 mIU/L (ref 0.40–4.50)

## 2022-09-16 DIAGNOSIS — E1122 Type 2 diabetes mellitus with diabetic chronic kidney disease: Secondary | ICD-10-CM | POA: Diagnosis not present

## 2022-09-19 ENCOUNTER — Other Ambulatory Visit: Payer: Self-pay | Admitting: Family Medicine

## 2022-09-19 DIAGNOSIS — E034 Atrophy of thyroid (acquired): Secondary | ICD-10-CM

## 2022-09-20 ENCOUNTER — Other Ambulatory Visit: Payer: Self-pay

## 2022-09-20 DIAGNOSIS — E034 Atrophy of thyroid (acquired): Secondary | ICD-10-CM

## 2022-09-22 DIAGNOSIS — E1165 Type 2 diabetes mellitus with hyperglycemia: Secondary | ICD-10-CM | POA: Diagnosis not present

## 2022-09-23 ENCOUNTER — Ambulatory Visit: Payer: Medicare Other | Admitting: Internal Medicine

## 2022-09-23 ENCOUNTER — Encounter: Payer: Self-pay | Admitting: Internal Medicine

## 2022-09-23 VITALS — BP 122/80 | HR 71 | Temp 97.8°F | Ht 68.0 in | Wt 214.6 lb

## 2022-09-23 DIAGNOSIS — G4733 Obstructive sleep apnea (adult) (pediatric): Secondary | ICD-10-CM | POA: Diagnosis not present

## 2022-09-23 NOTE — Progress Notes (Signed)
PULMONARY/SLEEP OFFICE FOLLOW-UP NOTE   Requesting MD/Service: Yvonne Kendall, MD    PT PROFILE: 82 y.o. male former smoker referred for evaluation of fatigue and daytime sleepiness  DATA: 03/17/16 echocardiogram: Grade 1 diastolic dysfunction. Otherwise normal. 11/11/16 PSG: AHI 29.5/hr. AutoSet 5-20 cm H2O recommended 01/06-02/04/20 CPAP compliance: Usage 30/30.  >4 hours: 30/30.  Median pressure 6.7 cm H2O.  Mean AHI 1.3/hour. 02/2020 compliance DL-->Compliance report reviewed with patient in detail 100% compliance for days and >4 hrs AHI reduced to 0.7 Cpap 5-20      CC Follow-up assessment of OSA   HPI No exacerbation at this time No evidence of heart failure at this time No evidence or signs of infection at this time No respiratory distress No fevers, chills, nausea, vomiting, diarrhea No evidence of lower extremity edema No evidence hemoptysis  Doing well with CPAP More energy less fatigue More refreshed sleep Auto CPAP 5-15 AHI reduced to 1.9 100% compliance   Compliance report reviewed with patient in detail 100% compliance for days and >4 hrs AHI reduced to 0.7 Cpap 5-15       BP 122/80 (BP Location: Left Arm, Cuff Size: Normal)   Pulse 71   Temp 97.8 F (36.6 C) (Temporal)   Ht 5\' 8"  (1.727 m)   Wt 214 lb 9.6 oz (97.3 kg)   SpO2 94%   BMI 32.63 kg/m       Review of Systems: Gen:  Denies  fever, sweats, chills weight loss  HEENT: Denies blurred vision, double vision, ear pain, eye pain, hearing loss, nose bleeds, sore throat Cardiac:  No dizziness, chest pain or heaviness, chest tightness,edema, No JVD Resp:   No cough, -sputum production, -shortness of breath,-wheezing, -hemoptysis,  Other:  All other systems negative   Physical Examination:   General Appearance: No distress  EYES PERRLA, EOM intact.   NECK Supple, No JVD Pulmonary: normal breath sounds, No wheezing.  CardiovascularNormal S1,S2.  No m/r/g.   Abdomen: Benign,  Soft, non-tender. Neurology UE/LE 5/5 strength, no focal deficits Ext pulses intact, cap refill intact ALL OTHER ROS ARE NEGATIVE     DATA:      Latest Ref Rng & Units 08/02/2022   10:41 AM 07/07/2021   10:10 AM 06/18/2021   10:52 AM  BMP  Glucose 70 - 99 mg/dL 95  161  096   BUN 8 - 23 mg/dL 19  17  15    Creatinine 0.61 - 1.24 mg/dL 0.45  4.09  8.11   BUN/Creat Ratio 6 - 22 (calc)   12   Sodium 135 - 145 mmol/L 139  140  142   Potassium 3.5 - 5.1 mmol/L 4.1  4.1  4.5   Chloride 98 - 111 mmol/L 106  109  108   CO2 22 - 32 mmol/L 22  22  25    Calcium 8.9 - 10.3 mg/dL 9.6  9.9  9.9        Latest Ref Rng & Units 08/02/2022   10:41 AM 06/18/2021   10:52 AM 11/20/2020   12:12 PM  CBC  WBC 4.0 - 10.5 K/uL 6.7  7.2  6.7   Hemoglobin 13.0 - 17.0 g/dL 91.4  78.2  95.6   Hematocrit 39.0 - 52.0 % 43.6  44.5  40.8   Platelets 150 - 400 K/uL 143  152  161     CXR:  No recent film   Assessment and plan 82 year old pleasant white male seen today for follow-up assessment of OSA  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  Important to get eight or more hours of sleep  Limiting the use of the computer and television before bedtime.  Decrease naps during the day, so night time sleep will become enhanced.  Limit caffeine, and sleep deprivation.  HTN, stroke, uncontrolled diabetes and heart failure are potential risk factors.  Risk of untreated sleep apnea including cardiac arrhthymias, stroke, DM, pulm HTN.      MEDICATION ADJUSTMENTS/LABS AND TESTS ORDERED: CONTINUE CPAP AS PRESCRIBED ADVISED TO WEAR CPAP DURING NAP TIME   CURRENT MEDICATIONS REVIEWED AT LENGTH WITH PATIENT TODAY   Patient  satisfied with Plan of action and management. All questions answered  Follow up in 1 year  Total time spent 25 mins   Aleeza [place] Santiago Glad, M.D.  [facility]  Medical Director [facility] Medical Director [facility] Cardio-Pulmonary Department

## 2022-09-23 NOTE — Patient Instructions (Signed)
EXCELLENT JOB!! A+  Continue CPAP as prescribed  Avoid humidity and heat

## 2022-09-27 ENCOUNTER — Telehealth: Payer: Self-pay | Admitting: Family Medicine

## 2022-09-27 NOTE — Telephone Encounter (Signed)
Completed.

## 2022-09-27 NOTE — Telephone Encounter (Signed)
Victorino Dike w/ [facility] needs the noat recent OV notes for te pt in order to fill his Rx  Fax  [phone]  Cb [phone]  This is urgent per Victorino Dike and hopes yoou can sendasap

## 2022-10-07 ENCOUNTER — Other Ambulatory Visit: Payer: Self-pay | Admitting: Medical

## 2022-10-08 ENCOUNTER — Other Ambulatory Visit: Payer: Self-pay | Admitting: Internal Medicine

## 2022-10-08 DIAGNOSIS — E1159 Type 2 diabetes mellitus with other circulatory complications: Secondary | ICD-10-CM

## 2022-10-08 DIAGNOSIS — I152 Hypertension secondary to endocrine disorders: Secondary | ICD-10-CM

## 2022-10-16 DIAGNOSIS — E1122 Type 2 diabetes mellitus with diabetic chronic kidney disease: Secondary | ICD-10-CM | POA: Diagnosis not present

## 2022-10-23 DIAGNOSIS — E1165 Type 2 diabetes mellitus with hyperglycemia: Secondary | ICD-10-CM | POA: Diagnosis not present

## 2022-10-29 ENCOUNTER — Other Ambulatory Visit: Payer: Self-pay | Admitting: Family Medicine

## 2022-10-29 ENCOUNTER — Other Ambulatory Visit: Payer: Self-pay | Admitting: Internal Medicine

## 2022-10-29 ENCOUNTER — Other Ambulatory Visit: Payer: Self-pay | Admitting: Medical

## 2022-10-29 DIAGNOSIS — I2089 Other forms of angina pectoris: Secondary | ICD-10-CM

## 2022-10-29 DIAGNOSIS — I700 Atherosclerosis of aorta: Secondary | ICD-10-CM

## 2022-10-29 DIAGNOSIS — I152 Hypertension secondary to endocrine disorders: Secondary | ICD-10-CM

## 2022-10-29 DIAGNOSIS — Z8711 Personal history of peptic ulcer disease: Secondary | ICD-10-CM

## 2022-11-03 ENCOUNTER — Telehealth: Payer: Self-pay | Admitting: *Deleted

## 2022-11-03 NOTE — Patient Outreach (Signed)
  Care Coordination   Care Coordination  Visit Note   11/03/2022 Name: Drew [name] MRN: [id number] DOB: Mar 10, 1940  Drew [name] is a 82 y.o. year old male who sees Alba Cory, MD for primary care. I spoke with  [name] by phone today.  What matters to the patients health and wellness today?  Patient denies having any community resource needs, however is interested in follow up from the [facility] for diabetes management. Initial appointment scheduled for 11/29/22.   Goals Addressed             This Visit's Progress    care coordination activities       Interventions Today    Flowsheet Row Most Recent Value  General Interventions   General Interventions Discussed/Reviewed General Interventions Discussed  [care coordination program discussed, patient assessed for care coordination needs, SDOH completed patient denied having any community resource needs but is interested in call from RN for diabetes management]              SDOH assessments and interventions completed:  Yes  SDOH Interventions Today    Flowsheet Row Most Recent Value  SDOH Interventions   Food Insecurity Interventions Intervention Not Indicated  Housing Interventions Intervention Not Indicated  Transportation Interventions Intervention Not Indicated  Utilities Interventions Intervention Not Indicated        Care Coordination Interventions:  Yes, provided   Follow up plan: Follow up call scheduled for 11/29/22    Encounter Outcome:  Patient Visit Completed

## 2022-11-16 ENCOUNTER — Telehealth: Payer: Self-pay | Admitting: Family Medicine

## 2022-11-16 NOTE — Telephone Encounter (Signed)
Herbert Spires from [facility] requested medical notes for the [facility] in order for them to fill script. Please send to fax#[phone]

## 2022-11-22 DIAGNOSIS — E1165 Type 2 diabetes mellitus with hyperglycemia: Secondary | ICD-10-CM | POA: Diagnosis not present

## 2022-11-24 ENCOUNTER — Telehealth: Payer: Self-pay | Admitting: Family Medicine

## 2022-11-24 NOTE — Telephone Encounter (Signed)
[employer] is calling in because they sent a request for Office Notes over on 09/27/22. Kathlene November says they need an addended version sent over that says the pt uses the CGM freestlye libre.

## 2022-11-26 NOTE — Telephone Encounter (Signed)
Kathlene November, with [employer] has called to check on the status of this request. Per Kathlene November, the office visit notes for patient was received for service date 08/30/2022 but Kathlene November states [employer] is requesting an addedum stating that patient is using CGM freestyle libre 2.  Addendum can be faxed to #: [phone]  [employer] Contact # [phone]

## 2022-11-26 NOTE — Telephone Encounter (Signed)
I called and let them know Dr. Carlynn Purl will be back next week and we can get this taken care of at that time.

## 2022-11-29 ENCOUNTER — Ambulatory Visit: Payer: Self-pay | Admitting: *Deleted

## 2022-11-29 NOTE — Patient Outreach (Signed)
Care Coordination   Initial Visit Note   11/29/2022 Name: Drew [name] MRN: [id number] DOB: [birth year]  Drew [name] is a 82 y.o. year old male who sees Alba Cory, MD for primary care. I spoke with  [name] by phone today.  What matters to the patients health and wellness today?  Patient report he feels he is doing well with management of chronic conditions, monitoring blood sugar daily, will restart BP monitoring at least twice a week.  Denies need for follow up call, but will call this care manager if needs change.  Denies any urgent concerns, encouraged to contact this care manager with questions.      Goals Addressed             This Visit's Progress    COMPLETED: care coordination activities - no follow up needed   On track      Interventions Today    Flowsheet Row Most Recent Value  Chronic Disease   Chronic disease during today's visit Congestive Heart Failure (CHF), Diabetes, Hypertension (HTN)  General Interventions   General Interventions Discussed/Reviewed General Interventions Reviewed, Doctor Visits, Annual Eye Exam, Labs, Vaccines  Labs Hgb A1c every 3 months  [A1C is less than 6]  Vaccines Flu  Doctor Visits Discussed/Reviewed Doctor Visits Reviewed, PCP, Specialist  [facility] and Cardiology on 11/12 and 11/13]  PCP/Specialist Visits Compliance with follow-up visit  Exercise Interventions   Exercise Discussed/Reviewed Physical Activity  Physical Activity Discussed/Reviewed Physical Activity Reviewed  Education Interventions   Education Provided Provided Education  Provided Verbal Education On Nutrition, Eye Care, Labs, Blood Sugar Monitoring, Medication, When to see the doctor  [blood sugars range 130-140]  Labs Reviewed Hgb A1c  Nutrition Interventions   Nutrition Discussed/Reviewed Nutrition Discussed, Carbohydrate meal planning, Adding fruits and vegetables, Portion sizes, Decreasing sugar intake              SDOH assessments and  interventions completed:  No{THN Tip this will not be part of the note when signed-REQUIRED REPORT FIELD DO NOT DELETE (Optional):27901}     Care Coordination Interventions:  Yes, provided {THN Tip this will not be part of the note when signed-REQUIRED REPORT FIELD DO NOT DELETE (Optional):27901}  Follow up plan: No further intervention required.   Encounter Outcome:  Patient Visit Completed {THN Tip this will not be part of the note when signed-REQUIRED REPORT FIELD DO NOT DELETE (Optional):27901}  Kemper Durie, RN, MSN, [facility] Care Management Care Management Coordinator [phone]

## 2022-11-30 ENCOUNTER — Other Ambulatory Visit: Payer: Self-pay | Admitting: Family Medicine

## 2022-11-30 DIAGNOSIS — E034 Atrophy of thyroid (acquired): Secondary | ICD-10-CM

## 2022-11-30 NOTE — Patient Instructions (Signed)
Visit Information  Thank you for taking time to visit with me today. Please don't hesitate to contact me if I can be of assistance to you before our next scheduled telephone appointment.  Following are the goals we discussed today:  Continue monitoring blood sugars daily. Monitor blood pressure/heart rate at least 2-3 times a week. Call to schedule routine diabetic eye exam. Follow diabetic diet, watching carbohydrate and sugar intake.  Please call the care guide team at [phone] if you need to cancel or reschedule your appointment.   Please call the Suicide and Crisis Lifeline: 988 call the [place] National Suicide Prevention Lifeline: 3164065505 or TTY: 607-469-0329 TTY 870-353-3729) to talk to a trained counselor call 1-800-273-TALK (toll free, 24 hour hotline) call 911 if you are experiencing a Mental Health or Behavioral Health Crisis or need someone to talk to.  The patient verbalized understanding of instructions, educational materials, and care plan provided today and agreed to receive a mailed copy of patient instructions, educational materials, and care plan.   The patient has been provided with contact information for the care management team and has been advised to call with any health related questions or concerns.   Kemper Durie [facility] Care Management Care Management Coordinator [phone]

## 2022-12-02 ENCOUNTER — Other Ambulatory Visit: Payer: Self-pay | Admitting: Medical

## 2022-12-23 DIAGNOSIS — E1165 Type 2 diabetes mellitus with hyperglycemia: Secondary | ICD-10-CM | POA: Diagnosis not present

## 2022-12-27 ENCOUNTER — Other Ambulatory Visit: Payer: Medicare Other | Admitting: Pharmacist

## 2022-12-27 NOTE — Progress Notes (Signed)
12/27/2022 Name: Drew [name] MRN: [id number] DOB: November 29, 1940  Chief Complaint  Patient presents with   Medication Assistance    Drew [name] is a 82 y.o. year old male who presented for a telephone visit.   They were referred to the pharmacist for assistance in managing medication access.   Conversation limited today as patient reports that he is not currently home; unable to complete medication review  Subjective:  Care Team: Primary Care Provider: Alba Cory, MD ; Next Scheduled Visit: 01/04/2023 Cardiologist: Yvonne Kendall, MD; Next Scheduled Visit: 01/05/2023 Nephrologist: Mosetta Pigeon, MD; Next Scheduled Visit: 02/01/2023  Medication Access/Adherence  Current Pharmacy:  [facility]) - [place], [place] - [address] [facility] [address] [place] [address] [place] [address] Phone: [phone] Fax: [phone]  [id number] - [address], [place] - [address] [facility] [place] [address] Phone: [phone] Fax: [phone]  [facility] - [place], [place] - [address] [place] [address] Phone: [phone] Fax: [phone]   Patient reports affordability concerns with their medications: No  Patient reports access/transportation concerns to their pharmacy: No  Patient reports adherence concerns with their medications:  No    Reports takes medications from weekly pillbox as filled by his wife   Diabetes:  Current medications:  - Farxiga 10 mg daily before breakfast - Victoza 1.8 mg injection daily  Current glucose readings: after breakfast ranging 125-160  Statin therapy: rosuvastatin 20 mg daily  Current medication access support: reports enrolled in patient assistance for Farxiga from [employer] and Victoza from [employer] and requests assistance with re-enrollment   Objective:  Lab Results  Component Value Date   HGBA1C 5.9 (A)  08/30/2022    Lab Results  Component Value Date   CREATININE 1.36 (H) 08/02/2022   BUN 19 08/02/2022   NA 139 08/02/2022   K 4.1 08/02/2022   CL 106 08/02/2022   CO2 22 08/02/2022    Lab Results  Component Value Date   CHOL 98 08/30/2022   HDL 30 (L) 08/30/2022   LDLCALC 28 08/30/2022   TRIG 379 (H) 08/30/2022   CHOLHDL 3.3 08/30/2022   BP Readings from Last 3 Encounters:  09/23/22 122/80  08/30/22 118/80  06/24/22 120/76   Pulse Readings from Last 3 Encounters:  09/23/22 71  08/30/22 69  06/24/22 63    Current Outpatient Medications on File Prior to Visit  Medication Sig Dispense Refill   acetaminophen (TYLENOL) 500 MG tablet Take 500 mg by mouth every 6 (six) hours as needed.     albuterol (VENTOLIN HFA) 108 (90 Base) MCG/ACT inhaler SMARTSIG:2 Puff(s) By Mouth Every 4 Hours PRN     allopurinol (ZYLOPRIM) 100 MG tablet Take 100 mg by mouth 2 (two) times daily.     amLODipine (NORVASC) 5 MG tablet TAKE 1 TABLET BY MOUTH DAILY 90 tablet 3   aspirin EC 81 MG tablet Take 81 mg by mouth daily.     carvedilol (COREG) 3.125 MG tablet TAKE 1 TABLET BY MOUTH TWICE  DAILY 180 tablet 0   Cholecalciferol (VITAMIN D3) 25 MCG (1000 UT) CAPS Take 1,000 Units by mouth daily.     Coenzyme Q10 (CO Q 10) 100 MG CAPS Take 100 mg by mouth daily.     dapagliflozin propanediol (FARXIGA) 10 MG TABS tablet Take 1 tablet (10 mg total) by mouth daily before breakfast. 90 tablet 3  famotidine (PEPCID) 20 MG tablet TAKE 1 TABLET BY MOUTH DAILY 80 tablet 3   levothyroxine (SYNTHROID) 25 MCG tablet TAKE 1 TABLET BY MOUTH IN THE  MORNING 100 tablet 2   liraglutide (VICTOZA) 18 MG/3ML SOPN Inject 1.8 mg into the skin daily. 9 mL 3   losartan (COZAAR) 25 MG tablet TAKE ONE-HALF TABLET BY MOUTH  DAILY 45 tablet 0   Multiple Vitamins-Minerals (CENTRUM SILVER PO) Take 200 mg by mouth daily.     Needle, Disp, 32G X 5/16" MISC 6 mm by Does not apply route 2 (two) times daily. 100 each 3   nitroGLYCERIN  (NITROSTAT) 0.4 MG SL tablet DISSOLVE 1 TABLET UNDER THE  TONGUE EVERY 5 MINUTES AS NEEDED FOR CHEST PAIN. MAX OF 3 TABLETS IN 15 MINUTES. CALL 911 IF PAIN  PERSISTS. 25 tablet 1   Omega-3 Fatty Acids (FISH OIL) 1000 MG CAPS Take 1,000 mg by mouth daily.     rosuvastatin (CRESTOR) 20 MG tablet TAKE 1 TABLET BY MOUTH DAILY 100 tablet 0   traZODone (DESYREL) 50 MG tablet TAKE 1/2 TO 1 TABLET BY MOUTH AT BEDTIME AS NEEDED FOR SLEEP 90 tablet 3   triamcinolone cream (KENALOG) 0.1 % Apply 1 application topically 2 (two) times daily. 30 g 0   Turmeric 500 MG CAPS Take 500 mg by mouth daily.     No current facility-administered medications on file prior to visit.     Assessment/Plan:   Unable to complete medication review today; will plan to complete medication review during our next telephone appointment - Patient reports will bring his medications with him to upcoming appointment with PCP  Reports has recently started monitoring his home blood pressure, but unable to review results today as he is not currently home - Encourage patient to bring record of these readings with him to upcoming appointment with PCP  Diabetes: - Currently controlled - Reviewed long term cardiovascular and renal outcomes of uncontrolled blood sugar - Reviewed goal A1c, goal fasting, and goal 2 hour post prandial glucose - Recommend to check glucose, keep log of results and have this record to review at upcoming medical appointments. Patient to contact provider office sooner if needed for readings outside of established parameters or symptoms - Will collaborate with PCP and CPhT to support patient with re-enrollment for patient assistance for Farxiga from [employer] and Victoza from [employer] for 2025 calendar year   Follow Up Plan: Clinical Pharmacist will follow up with patient by telephone on 02/09/2023 at 8:30 AM   Estelle Grumbles, PharmD, [facility] [phone]

## 2022-12-27 NOTE — Patient Instructions (Signed)
Goals Addressed             This Visit's Progress    Pharmacy Goals       The goal A1c is less than 7%. This is the best way to reduce the risk of the long term complications of diabetes, including heart disease, kidney disease, eye disease, strokes, and nerve damage. An A1c of less than 7% corresponds with fasting sugars less than 130 and 2 hour after meal sugars less than 180.  Please watch the mail for an envelope from [employer] containing the patient assistance program application. Please complete this application and mail back to [facility] Pharmacy Technician Noreene Larsson [place] along with a copy of your Medicare Part D prescription card and a copy of your proof of income document OR you can bring these documents to the office to have them faxed back to Attention: Pattricia Boss at Fax # [phone]   If you need to call Noreene Larsson, you can reach her at [phone]   If you need to reach out to patient assistance programs regarding refills or to find out the status of your application, you can do so by calling:   [employer] at [phone] [employer] at [phone]  Thank you!  Estelle Grumbles, PharmD, [facility] [phone]

## 2023-01-02 ENCOUNTER — Other Ambulatory Visit: Payer: Self-pay | Admitting: Medical

## 2023-01-03 NOTE — Progress Notes (Unsigned)
Name: Drew [name]   MRN: [id number]    DOB: 1940-08-27   Date:01/04/2023       Progress Note  Subjective  Chief Complaint  Follow Up  HPI  DMII: he has obesity, dyslipidemia, HTN and CKI. His weight is going down, but BMI is now below 35, A1C had  gone from 6.2 % to 6.9 %, 6.3% , 6.4% , 6.5 %, 6.4 % ,  6 % , 6.2 %  and past two visits stable at  5.9 %  He is still taking Victoza and [place] daily and denies hypoglycemic episodes glucose,  he states glucose has been controlled, he is also focusing on a diabetic diet .  Denies polyphagia, polyuria or polydipsia.  He is on ARB and statin therapy. He denies side effects of medication . He has occasional neuropathic pain on feet   Thrombocytopenia: continue to monitor   OSA: he is complaint with CPAP and states doing well, he occasionally woke up past couple of days with a headache, he states the mask is leaking , advised to contact the company to replace the mask   HTN/CHF with preserved EF : bp is towards low end of normal again, wife brought all medications and he is taking losartan 25 mg half pill , carvedilol 3.125 mg BID and norvasc 5  mg. He has intermittent chest pain He sees Dr. Okey Dupre - cardiologist , no orthopnea, wheezing but he has intermittent sob with moderate activity that has been stable . He has intermittent lower extremity edema .He is on SGL-2 agonist also   Dyslipidemia: he is doing well on medication, he is compliant, last LDL was down to 44. He is under the care of Dr. Okey Dupre.  We will recheck labs and hepatic function test   Stable Angina/Atherosclerosis aorta : he has intermittent chest pain but intermittent only,  under the care of Dr. Okey Dupre he does not have NTG at home. He states he has been doing well lately, taking higher dose of Crestor and aspirin daily, he is also on a beta blocker . LDL is at goal. He states no recent episodes of chest pain and has been walking in his yard for exercise   Obesity  BMI is now below  35 and  stable   Controlled gout: on allopurinol , no recent episodes, uric acid below 6. 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 Thedore Mins, no pruritus, good urine output, last GFR was 52 and stable. Unchanged   Hypothyroidism: on the same dose of levothyroxine , denies dysphagia, dry skin or change in bowel movements, last TSH at goal . He is no longer taking famotidine with levothyroxine   Patient Active Problem List   Diagnosis Date Noted   Controlled gout 10/19/2021   Diabetic polyneuropathy associated with type 2 diabetes mellitus (HCC) 10/19/2021   Insomnia 05/12/2021   Coronary artery disease of native artery of native heart with stable angina pectoris (HCC) 08/14/2020   Aortic atherosclerosis (HCC) 08/14/2020   Atrophy of kidney 10/10/2019   Edema of lower extremity 10/10/2019   Thrombocytopenia (HCC) 08/13/2019   Type 2 diabetes mellitus with obesity (HCC) 08/13/2019   Hyperlipidemia associated with type 2 diabetes mellitus (HCC) 08/13/2019   Neuropathy involving both lower extremities 05/04/2019   Polyneuropathy, unspecified 05/04/2019   Multiple renal cysts 04/09/2019   Stable angina (HCC) 12/29/2018   Onychomycosis of multiple toenails with type 2 diabetes mellitus (HCC) 11/30/2017   Chronic heart failure with  preserved ejection fraction (HCC) 10/20/2017   OSA on CPAP 02/10/2017   Abnormal ankle brachial index (ABI) 11/26/2015   Stage 3a chronic kidney disease (HCC) 08/15/2015   H/O: upper GI bleed    Hypothyroidism due to acquired atrophy of thyroid 03/11/2015   History of prostatitis 11/06/2014   Hypertension associated with type 2 diabetes mellitus (HCC) 11/06/2014   Mixed hyperlipidemia 11/06/2014   Flat feet, bilateral 11/06/2014   Chronic multiple gastric erosions 11/06/2014   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) 11/06/2014    Past Surgical History:  Procedure Laterality Date   CATARACT EXTRACTION, BILATERAL Bilateral  11/18/2017   COLONOSCOPY N/A 12/27/2013   Procedure: COLONOSCOPY;  Surgeon: Barrie Folk, MD;  Location: [facility] ENDOSCOPY;  Service: Endoscopy;  Laterality: N/A;   ENTEROSCOPY N/A 12/21/2013   Procedure: ENTEROSCOPY;  Surgeon: Vertell Novak., MD;  Location: [facility] ENDOSCOPY;  Service: Endoscopy;  Laterality: N/A;   ENTEROSCOPY N/A 12/27/2013   Procedure: ENTEROSCOPY;  Surgeon: Barrie Folk, MD;  Location[facility] ENDOSCOPY;  Service: Endoscopy;  Laterality: N/A;    Family History  Problem Relation Age of Onset   Hyperlipidemia Father    Kidney disease Father    Heart disease Father    Congestive Heart Failure Mother    Heart disease Mother    Hyperlipidemia Daughter    Hyperlipidemia Son    Diabetes Son    Anxiety disorder Daughter    Depression Daughter    Arthritis Daughter     Social History   Tobacco Use   Smoking status: Former    Current packs/day: 0.00    Types: Cigarettes, Cigars    Start date: 66    Quit date: 1990    Years since quitting: 34.8   Smokeless tobacco: Never   Tobacco comments:    Smoked afternoon cigar  Substance Use Topics   Alcohol use: No     Current Outpatient Medications:    acetaminophen (TYLENOL) 500 MG tablet, Take 500 mg by mouth every 6 (six) hours as needed., Disp: , Rfl:    albuterol (VENTOLIN HFA) 108 (90 Base) MCG/ACT inhaler, SMARTSIG:2 Puff(s) By Mouth Every 4 Hours PRN, Disp: , Rfl:    allopurinol (ZYLOPRIM) 100 MG tablet, Take 100 mg by mouth 2 (two) times daily., Disp: , Rfl:    amLODipine (NORVASC) 5 MG tablet, TAKE 1 TABLET BY MOUTH DAILY, Disp: 90 tablet, Rfl: 3   aspirin EC 81 MG tablet, Take 81 mg by mouth daily., Disp: , Rfl:    carvedilol (COREG) 3.125 MG tablet, TAKE 1 TABLET BY MOUTH TWICE  DAILY, Disp: 180 tablet, Rfl: 0   Cholecalciferol (VITAMIN D3) 25 MCG (1000 UT) CAPS, Take 1,000 Units by mouth daily., Disp: , Rfl:    Coenzyme Q10 (CO Q 10) 100 MG CAPS, Take 100 mg by mouth daily., Disp: , Rfl:    dapagliflozin  propanediol (FARXIGA) 10 MG TABS tablet, Take 1 tablet (10 mg total) by mouth daily before breakfast., Disp: 90 tablet, Rfl: 3   famotidine (PEPCID) 20 MG tablet, TAKE 1 TABLET BY MOUTH DAILY, Disp: 80 tablet, Rfl: 3   Insulin Pen Needle 32G X 6 MM MISC, 1 each by Does not apply route daily at 12 noon., Disp: 100 each, Rfl: 1   levothyroxine (SYNTHROID) 25 MCG tablet, TAKE 1 TABLET BY MOUTH IN THE  MORNING, Disp: 100 tablet, Rfl: 2   liraglutide (VICTOZA) 18 MG/3ML SOPN, Inject 1.8 mg into the skin daily.,  Disp: 9 mL, Rfl: 3   losartan (COZAAR) 25 MG tablet, TAKE ONE-HALF TABLET BY MOUTH  DAILY, Disp: 45 tablet, Rfl: 0   Multiple Vitamins-Minerals (CENTRUM SILVER PO), Take 200 mg by mouth daily., Disp: , Rfl:    nitroGLYCERIN (NITROSTAT) 0.4 MG SL tablet, DISSOLVE 1 TABLET UNDER THE  TONGUE EVERY 5 MINUTES AS NEEDED FOR CHEST PAIN. MAX OF 3 TABLETS IN 15 MINUTES. CALL 911 IF PAIN  PERSISTS., Disp: 25 tablet, Rfl: 1   Omega-3 Fatty Acids (FISH OIL) 1000 MG CAPS, Take 1,000 mg by mouth daily., Disp: , Rfl:    traZODone (DESYREL) 50 MG tablet, TAKE 1/2 TO 1 TABLET BY MOUTH AT BEDTIME AS NEEDED FOR SLEEP, Disp: 90 tablet, Rfl: 3   triamcinolone cream (KENALOG) 0.1 %, Apply 1 application topically 2 (two) times daily., Disp: 30 g, Rfl: 0   Turmeric 500 MG CAPS, Take 500 mg by mouth daily., Disp: , Rfl:    rosuvastatin (CRESTOR) 20 MG tablet, Take 1 tablet (20 mg total) by mouth daily., Disp: 100 tablet, Rfl: 1  Allergies  Allergen Reactions   Niacin And Related Itching and Rash    I personally reviewed active problem list, medication list, allergies, family history, social history, health maintenance with the patient/caregiver today.   ROS  Ten systems reviewed and is negative except as mentioned in HPI    Objective  Vitals:   01/04/23 1042  BP: 124/76  Pulse: 67  Resp: 14  Temp: 97.6 F (36.4 C)  TempSrc: Oral  SpO2: 97%  Weight: 215 lb (97.5 kg)  Height: 5\' 8"  (1.727 m)    Body  mass index is 32.69 kg/m.  Physical Exam  Constitutional: Patient appears well-developed and well-nourished. Obese  No distress.  HEENT: head atraumatic, normocephalic, pupils equal and reactive to light, neck supple Cardiovascular: Normal rate, regular rhythm and normal heart sounds.  No murmur heard. No BLE edema. Pulmonary/Chest: Effort normal and breath sounds normal. No respiratory distress. Abdominal: Soft.  There is no tenderness. Psychiatric: Patient has a normal mood and affect. behavior is normal. Judgment and thought content normal.   Recent Results (from the past 2160 hour(s))  POCT HgB A1C     Status: Abnormal   Collection Time: 01/04/23 10:45 AM  Result Value Ref Range   Hemoglobin A1C 5.9 (A) 4.0 - 5.6 %   HbA1c POC (<> result, manual entry)     HbA1c, POC (prediabetic range)     HbA1c, POC (controlled diabetic range)       PHQ2/9:    01/04/2023   10:43 AM 11/03/2022   11:58 AM 08/30/2022   10:42 AM 05/27/2022   10:40 AM 04/26/2022   10:18 AM  Depression screen PHQ 2/9  Decreased Interest 0 0 0 0 0  Down, Depressed, Hopeless 0 0 0 0 0  PHQ - 2 Score 0 0 0 0 0  Altered sleeping 0  0 0 0  Tired, decreased energy 0  0 0 0  Change in appetite 0  0  0  Feeling bad or failure about yourself  0  0 0 0  Trouble concentrating 0  0 0 0  Moving slowly or fidgety/restless 0  0 0 0  Suicidal thoughts 0  0 0 0  PHQ-9 Score 0  0 0 0    phq 9 is negative   Fall Risk:    01/04/2023   10:43 AM 08/30/2022   10:41 AM 05/27/2022   10:36 AM 04/26/2022  10:18 AM 03/18/2022   11:28 AM  Fall Risk   Falls in the past year? 1 0 1 1 0  Comment   fell in yard in [place] no injuries    Number falls in past yr: 0  0 0   Injury with Fall? 0  0 0   Risk for fall due to : History of fall(s) No Fall Risks No Fall Risks History of fall(s) No Fall Risks  Follow up Falls prevention discussed;Education provided;Falls evaluation completed Falls prevention discussed Education provided;Falls  prevention discussed Falls prevention discussed;Falls evaluation completed;Education provided Falls prevention discussed      Functional Status Survey: Is the patient deaf or have difficulty hearing?: No Does the patient have difficulty seeing, even when wearing glasses/contacts?: No Does the patient have difficulty concentrating, remembering, or making decisions?: No Does the patient have difficulty walking or climbing stairs?: No Does the patient have difficulty dressing or bathing?: No Does the patient have difficulty doing errands alone such as visiting a doctor's office or shopping?: No    Assessment & Plan  1. Type 2 diabetes mellitus with stage 3a chronic kidney disease, without long-term current use of insulin (HCC)  - POCT HgB A1C - Insulin Pen Needle 32G X 6 MM MISC; 1 each by Does not apply route daily at 12 noon.  Dispense: 100 each; Refill: 1  2. Need for immunization against influenza  - Flu Vaccine Trivalent High Dose (Fluad)  3. Stable angina (HCC)  - rosuvastatin (CRESTOR) 20 MG tablet; Take 1 tablet (20 mg total) by mouth daily.  Dispense: 100 tablet; Refill: 1  4. Aortic atherosclerosis (HCC)  - rosuvastatin (CRESTOR) 20 MG tablet; Take 1 tablet (20 mg total) by mouth daily.  Dispense: 100 tablet; Refill: 1  5. Thrombocytopenia (HCC)  We will continue to monitor   6. Chronic heart failure with preserved ejection fraction (HCC)  Compliant with medication  7. Stage 3a chronic kidney disease (HCC)  Keep follow up with nephrologist   8. Controlled gout  Last uric acid at goal  9. Hypothyroidism due to acquired atrophy of thyroid  Last TSH at goal   10. OSA on CPAP  Needs to replace the mask  11. Other insomnia

## 2023-01-03 NOTE — Progress Notes (Unsigned)
Cardiology Office Note:  .   Date:  01/05/2023  ID:  JALYNN HUTZELL, DOB December 18, 1940, MRN [id number] PCP: Alba Cory, MD  [facility] Providers Cardiologist:  Yvonne Kendall, MD     History of Present Illness: .   AKHARI WASHKO is a 82 y.o. male with history of chronic HFpEF, hypertension, hyperlipidemia, chronic kidney disease stage III, GERD, and obesity, who presents for follow-up of HFpEF and orthostatic lightheadedness.  He was last seen in our office in May by Cadence Furth, [place], at which time he was doing fairly well but continued to have occasional orthostatic lightheadedness.  Losartan was reduced to 12.5 mg daily.  Today, Mr. Bristow reports that his orthostatic lightheadedness has improved with de-escalation of losartan.  He still has occasional exertional dyspnea that is unchanged from prior visits.  He also has intermittent lower extremity edema that is stable.  He has not had any chest pain or palpitations.  He intermittently checks his blood pressures at home and notes systolic readings in the 130s to 140s.  He is tolerating his medications well.  ROS: See HPI  Studies Reviewed: Marland Kitchen   EKG Interpretation Date/Time:  Wednesday January 05 2023 09:00:23 EST Ventricular Rate:  57 PR Interval:  166 QRS Duration:  78 QT Interval:  438 QTC Calculation: 426 R Axis:   -40  Text Interpretation: Sinus bradycardia Left axis deviation Nonspecific T wave abnormality When compared with ECG of 24-Jun-2022 No significant change was found Confirmed by Gracianna Vink ([id number]) on 01/05/2023 9:14:09 AM    TTE (12/25/2020): Normal LV size and wall thickness.  LVEF 55-6% with normal wall motion and grade 1 diastolic dysfunction.  Normal RV size and function.  Normal PA pressure.  Normal biatrial size.  No pericardial effusion.  Mild mitral regurgitation.  Aortic sclerosis with mild regurgitation.  Normal CVP. Pharmacologic MPI (11/26/2020): Low risk study without evidence of ischemia or  scar.  LVEF 55-65%.  Coronary artery calcification noted. Coronary CTA (01/10/2020): High coronary calcium score (02/28/2000; 79th percentile).  LMCA normal.  LAD with 25-50% calcified plaque in the proximal and mid segments.  Normal LCx.  Dominant RCA with 50-69% proximal and 25-49% mid vessel disease.  CT FFR was negative.  Moderate size hiatal hernia and aortic atherosclerosis were also noted. Pharmacologic MPI (06/09/16): Low risk study without ischemia. Normal wall motion with LVEF of 72%. TTE (03/17/16): Normal LV size percent. There is grade 1 diastolic dysfunction. Aortic valve is moderately thickened without stenosis. There is mild aortic regurgitation. Normal RV size and function. Lower extremity arterial vascular study (12/22/15): ABIs right 1.5, left 1.6. TBIs right 1.3, left 1.1.  Risk Assessment/Calculations:             Physical Exam:   VS:  BP 122/60 (BP Location: Left Arm, Patient Position: Sitting, Cuff Size: Normal)   Pulse (!) 57   Ht 5\' 8"  (1.727 m)   Wt 220 lb (99.8 kg)   SpO2 94%   BMI 33.45 kg/m    Wt Readings from Last 3 Encounters:  01/05/23 220 lb (99.8 kg)  01/04/23 215 lb (97.5 kg)  09/23/22 214 lb 9.6 oz (97.3 kg)    General:  NAD. Neck: No JVD or HJR. Lungs: Clear to auscultation bilaterally without wheezes or crackles. Heart: Bradycardic but regular with 1/6 systolic murmur.  No rubs or gallops. Abdomen: Soft, nontender, nondistended. Extremities: No lower extremity edema.  ASSESSMENT AND PLAN: .    Chronic HFpEF: Mr. Guernsey reports stable  NYHA class II symptoms and appears euvolemic on examination today.  Importance of sodium restriction was reinforced.  Continue current regimen of carvedilol, dapagliflozin, and losartan.  Defer initiation of a diuretic, as he does not appear volume overloaded today and I do not wish to worsen his renal dysfunction or orthostatic hypotension by causing intravascular volume depletion.  Coronary artery disease with stable  angina: No angina reported.  Prior coronary CTA showed significant coronary artery calcification but nonobstructive disease.  Subsequent MPI was without ischemia.  Continue aspirin and statin therapy to prevent progression of disease, as well as antianginal therapy for possible component of microvascular dysfunction with amlodipine and carvedilol.  Hypertension and orthostatic lightheadedness: Blood pressure well-controlled today.  Orthostatic lightheadedness has improved with de-escalation of losartan at our last visit.  Continue current regimen of amlodipine, carvedilol, and losartan.  Hyperlipidemia associated with type 2 diabetes mellitus: LDL well-controlled on last check in July.  Triglycerides were moderately elevated.  Continue rosuvastatin 20 mg daily and fish oil.  Lifestyle modifications encouraged.  Chronic kidney disease stage 3a: Continue current medications with avoidance of nephrotoxic agents.  Continue follow-up with nephrology as planned next month.    Dispo: Return to clinic in 1 year.  Signed, Yvonne Kendall, MD

## 2023-01-04 ENCOUNTER — Ambulatory Visit: Payer: Medicare Other | Admitting: Family Medicine

## 2023-01-04 ENCOUNTER — Encounter: Payer: Self-pay | Admitting: Family Medicine

## 2023-01-04 ENCOUNTER — Other Ambulatory Visit: Payer: Self-pay | Admitting: Medical

## 2023-01-04 VITALS — BP 124/76 | HR 67 | Temp 97.6°F | Resp 14 | Ht 68.0 in | Wt 215.0 lb

## 2023-01-04 DIAGNOSIS — E1122 Type 2 diabetes mellitus with diabetic chronic kidney disease: Secondary | ICD-10-CM | POA: Diagnosis not present

## 2023-01-04 DIAGNOSIS — I5032 Chronic diastolic (congestive) heart failure: Secondary | ICD-10-CM

## 2023-01-04 DIAGNOSIS — D696 Thrombocytopenia, unspecified: Secondary | ICD-10-CM | POA: Diagnosis not present

## 2023-01-04 DIAGNOSIS — I2089 Other forms of angina pectoris: Secondary | ICD-10-CM | POA: Diagnosis not present

## 2023-01-04 DIAGNOSIS — G4709 Other insomnia: Secondary | ICD-10-CM

## 2023-01-04 DIAGNOSIS — E034 Atrophy of thyroid (acquired): Secondary | ICD-10-CM | POA: Diagnosis not present

## 2023-01-04 DIAGNOSIS — G4733 Obstructive sleep apnea (adult) (pediatric): Secondary | ICD-10-CM

## 2023-01-04 DIAGNOSIS — M109 Gout, unspecified: Secondary | ICD-10-CM

## 2023-01-04 DIAGNOSIS — I700 Atherosclerosis of aorta: Secondary | ICD-10-CM | POA: Diagnosis not present

## 2023-01-04 DIAGNOSIS — N1831 Chronic kidney disease, stage 3a: Secondary | ICD-10-CM

## 2023-01-04 DIAGNOSIS — Z23 Encounter for immunization: Secondary | ICD-10-CM

## 2023-01-04 LAB — POCT GLYCOSYLATED HEMOGLOBIN (HGB A1C): Hemoglobin A1C: 5.9 % — AB (ref 4.0–5.6)

## 2023-01-04 MED ORDER — INSULIN PEN NEEDLE 32G X 6 MM MISC
1.0000 | Freq: Every day | 1 refills | Status: DC
Start: 2023-01-04 — End: 2023-06-24

## 2023-01-04 MED ORDER — ROSUVASTATIN CALCIUM 20 MG PO TABS: 20.0000 mg | ORAL_TABLET | Freq: Every day | ORAL | 1 refills | Status: DC

## 2023-01-04 NOTE — Telephone Encounter (Signed)
last visit 06/24/22 with plan to f/u in  6 months.    next visit: 01/05/23

## 2023-01-05 ENCOUNTER — Encounter: Payer: Self-pay | Admitting: Internal Medicine

## 2023-01-05 ENCOUNTER — Ambulatory Visit: Payer: Medicare Other | Attending: Internal Medicine | Admitting: Internal Medicine

## 2023-01-05 VITALS — BP 122/60 | HR 57 | Ht 68.0 in | Wt 220.0 lb

## 2023-01-05 DIAGNOSIS — I5032 Chronic diastolic (congestive) heart failure: Secondary | ICD-10-CM | POA: Diagnosis not present

## 2023-01-05 DIAGNOSIS — I25118 Atherosclerotic heart disease of native coronary artery with other forms of angina pectoris: Secondary | ICD-10-CM

## 2023-01-05 DIAGNOSIS — E785 Hyperlipidemia, unspecified: Secondary | ICD-10-CM

## 2023-01-05 DIAGNOSIS — N1831 Chronic kidney disease, stage 3a: Secondary | ICD-10-CM

## 2023-01-05 DIAGNOSIS — I10 Essential (primary) hypertension: Secondary | ICD-10-CM

## 2023-01-05 DIAGNOSIS — I951 Orthostatic hypotension: Secondary | ICD-10-CM

## 2023-01-05 DIAGNOSIS — E1169 Type 2 diabetes mellitus with other specified complication: Secondary | ICD-10-CM | POA: Diagnosis not present

## 2023-01-05 NOTE — Patient Instructions (Signed)
 Medication Instructions:  Your physician recommends that you continue on your current medications as directed. Please refer to the Current Medication list given to you today.   *If you need a refill on your cardiac medications before your next appointment, please call your pharmacy*   Lab Work: No labs ordered today    Testing/Procedures: No test ordered today    Follow-Up: At [facility], you and your health needs are our priority.  As part of our continuing mission to provide you with exceptional heart care, we have created designated Provider Care Teams.  These Care Teams include your primary Cardiologist (physician) and Advanced Practice Providers (APPs -  Physician Assistants and Nurse Practitioners) who all work together to provide you with the care you need, when you need it.  We recommend signing up for the patient portal called "MyChart".  Sign up information is provided on this After Visit Summary.  MyChart is used to connect with patients for Virtual Visits (Telemedicine).  Patients are able to view lab/test results, encounter notes, upcoming appointments, etc.  Non-urgent messages can be sent to your provider as well.   To learn more about what you can do with MyChart, go to [email or web address].    Your next appointment:   1 year(s)  Provider:   You may see Yvonne Kendall, MD or one of the following Advanced Practice Providers on your designated Care Team:   Nicolasa Ducking, NP Eula Listen, PA-C Cadence Fransico Michael, PA-C Charlsie Quest, NP Carlos Levering, NP

## 2023-01-10 ENCOUNTER — Other Ambulatory Visit: Payer: Self-pay | Admitting: Pharmacy Technician

## 2023-01-10 DIAGNOSIS — Z5986 Financial insecurity: Secondary | ICD-10-CM

## 2023-01-10 NOTE — Progress Notes (Signed)
Pharmacy Medication Assistance Program Note    01/10/2023  Patient ID: Drew [name], male   DOB: 09-22-1940, 82 y.o.   MRN: [id number]     01/10/2023  Outreach Medication One  Initial Outreach Date (Medication One) 01/10/2023  Manufacturer Medication One [employer] [facility] Victoza  Dose of Victoza 18mg /64ml  Type of Radiographer, therapeutic Assistance  Date Application Sent to Patient 01/12/2023  Application Items Requested Application;Proof of Income;Other  Date Application Sent to Prescriber 01/12/2023  Name of Prescriber Alba Cory          01/10/2023  Outreach Medication Two  Initial Outreach Date (Medication Two) 01/10/2023  Manufacturer Medication Two [employer]  [employer] [facility] Farxiga  Dose of Farxiga 10mg   Type of Radiographer, therapeutic Assistance  Date Application Sent to Patient 01/12/2023  Application Items Requested Application;Proof of Income;Other  Date Application Sent to Prescriber 01/12/2023  Name of Prescriber [facility]       Signature  Pattricia Boss, CPhT [facility]  Office: [phone] Fax: [phone] Email: [email or web address]

## 2023-01-21 ENCOUNTER — Other Ambulatory Visit: Payer: Self-pay | Admitting: Internal Medicine

## 2023-01-22 DIAGNOSIS — E1165 Type 2 diabetes mellitus with hyperglycemia: Secondary | ICD-10-CM | POA: Diagnosis not present

## 2023-01-27 ENCOUNTER — Other Ambulatory Visit: Payer: Self-pay | Admitting: Pharmacy Technician

## 2023-01-27 DIAGNOSIS — Z5986 Financial insecurity: Secondary | ICD-10-CM

## 2023-01-27 NOTE — Progress Notes (Signed)
Pharmacy Medication Assistance Program Note    01/27/2023  Patient ID: Drew [name], male   DOB: 1940-06-17, 82 y.o.   MRN: [id number]     01/10/2023 01/27/2023  Outreach Medication One  Initial Outreach Date (Medication One) 01/10/2023   Manufacturer Medication One [employer] [facility] Victoza   Dose of Victoza 18mg /94ml   Type of Radiographer, therapeutic Assistance   Date Application Sent to Patient 01/12/2023   Application Items Requested Application;Proof of Income;Other   Date Application Sent to Prescriber 01/12/2023   Name of Prescriber Alba Cory   Date Application Received From Patient  01/26/2023  Application Items Received From Patient  Application;Other;Proof of Income  Date Application Received From Provider  01/12/2023  Date Application Submitted to Manufacturer  01/26/2023  Method Application Sent to Manufacturer  Fax         01/10/2023 01/27/2023  Outreach Medication Two  Initial Outreach Date (Medication Two) 01/10/2023   Manufacturer Medication Two [employer]   [employer] [facility] Farxiga   Dose of Farxiga 10mg    Type of Radiographer, therapeutic Assistance   Date Application Sent to Patient 01/12/2023   Application Items Requested Application;Proof of Income;Other   Date Application Sent to Prescriber 01/12/2023   Name of Prescriber Alba Cory   Date Application Received From Patient  01/26/2023  Application Items Received From Patient  Application;Proof of Income;Other  Date Application Received From Provider  01/12/2023  Method Application Sent to Manufacturer  Fax  Date Application Submitted to Manufacturer  01/26/2023       Signature

## 2023-02-01 DIAGNOSIS — N281 Cyst of kidney, acquired: Secondary | ICD-10-CM | POA: Diagnosis not present

## 2023-02-01 DIAGNOSIS — E1129 Type 2 diabetes mellitus with other diabetic kidney complication: Secondary | ICD-10-CM | POA: Diagnosis not present

## 2023-02-01 DIAGNOSIS — I10 Essential (primary) hypertension: Secondary | ICD-10-CM | POA: Diagnosis not present

## 2023-02-01 DIAGNOSIS — N261 Atrophy of kidney (terminal): Secondary | ICD-10-CM | POA: Diagnosis not present

## 2023-02-01 DIAGNOSIS — N1831 Chronic kidney disease, stage 3a: Secondary | ICD-10-CM | POA: Diagnosis not present

## 2023-02-09 ENCOUNTER — Other Ambulatory Visit: Payer: Self-pay | Admitting: Pharmacist

## 2023-02-09 NOTE — Progress Notes (Signed)
02/14/2023 Name: Drew [name] MRN: [id number] DOB: 01-Oct-1940  Chief Complaint  Patient presents with   Medication Assistance   Medication Management    Drew [name] is a 82 y.o. year old male who presented for a telephone visit.   They were referred to the pharmacist for assistance in managing medication access.    Subjective:  Care Team: Primary Care Provider: Alba Cory, MD ; Next Scheduled Visit: 05/09/2023 Cardiologist: Yvonne Kendall, MD Nephrologist: Mosetta Pigeon, MD  Medication Access/Adherence  Current Pharmacy:  [facility]) - [place], [place] - [address] [facility] [address] [address] [place] [address] Phone: [phone] Fax: [phone]  [facility] - [address], [place] - [address] [facility] [place] [address] Phone: [phone] Fax: [phone]  [facility] Delivery - [place], [place] - [address] [place] [address] Phone: [phone] Fax: [phone]   Patient reports affordability concerns with their medications: No  Patient reports access/transportation concerns to their pharmacy: No  Patient reports adherence concerns with their medications:  No     Reports takes medications from weekly pillbox as filled by his wife     Diabetes:   Current medications:  - Victoza 1.8 mg injection daily - Find that patient is not taking Farxiga (10 mg daily before breakfast) as not previously enrolled in/did not receive from assistance program   Current glucose readings: before breakfast ranging 98-150   Statin therapy: rosuvastatin 20 mg daily   Current medication access support: Collaborating with PCP and CPhT to aid patient with enrollment in [place] from AZ[employer]Me and re-enrollment for Victoza from [employer] - CPhT faxed both applications to manufacturers on 01/26/2023 - Approved for Farxiga patient assistance from [employer]  through 02/22/2024  Hypertension/Heart Failure:  Current medications:  ACEi/ARB/ARNI: losartan 25 mg - 1/2 tablet (12.5 mg) daily SGLT2i: Farxiga 10 mg daily before breakfast Beta blocker: carvedilol 3.125 mg twice daily Amlodipine 5 mg - 1/2 (2.5 mg) daily  Patient has an automated, upper arm home BP cuff Current home blood pressure readings: last checked: - 12/14: 139/78, HR 61 - 12/11: 127/85  Patient denies hypotensive s/sx including dizziness, lightheadedness as long as takes positional changes slowly   Current physical activity: reports walks his dog ~6 minutes daily. Reports working on gradually walking longer  Current medication access support: Collaborating with PCP and CPhT to aid patient with enrollment in Farxiga from [employer]Me - Approved for Farxiga patient assistance from [employer] through 02/22/2024  Objective:  Lab Results  Component Value Date   HGBA1C 5.9 (A) 01/04/2023    Lab Results  Component Value Date   CREATININE 1.36 (H) 08/02/2022   BUN 19 08/02/2022   NA 139 08/02/2022   K 4.1 08/02/2022   CL 106 08/02/2022   CO2 22 08/02/2022    Lab Results  Component Value Date   CHOL 98 08/30/2022   HDL 30 (L) 08/30/2022   LDLCALC 28 08/30/2022   TRIG 379 (H) 08/30/2022   CHOLHDL 3.3 08/30/2022   BP Readings from Last 3 Encounters:  01/05/23 122/60  01/04/23 124/76  09/23/22 122/80   Pulse Readings from Last 3 Encounters:  01/05/23 (!) 57  01/04/23 67  09/23/22 71     Medications Reviewed Today     Reviewed by Manuela Neptune, RPH-CPP (Pharmacist) on 02/14/23 at 1412  Med List Status: <None>   Medication Order Taking? Sig  Documenting Provider Last Dose Status Informant  acetaminophen (TYLENOL) 500 MG tablet [id number] Yes Take 500 mg by mouth every 6 (six) hours as needed. [provider] Taking Active Self  albuterol (VENTOLIN HFA) 108 (90 Base) MCG/ACT inhaler [id number]  SMARTSIG:2 Puff(s) By Mouth Every 4 Hours PRN [provider]  Active   allopurinol (ZYLOPRIM) 100 MG tablet [id number] Yes Take 100 mg by mouth 2 (two) times daily. [provider] Taking Active Self           Med Note (NEWCOMER [place], BRANDY L   Wed Aug 13, 2020 10:19 AM)    amLODipine (NORVASC) 5 MG tablet [id number] Yes TAKE 1 TABLET BY MOUTH DAILY  Patient taking differently: Take 2.5 mg by mouth daily.   End, Cristal Deer, MD Taking Active   aspirin EC 81 MG tablet [id number] Yes Take 81 mg by mouth daily. [provider] Taking Active Self  carvedilol (COREG) 3.125 MG tablet [id number]  TAKE 1 TABLET BY MOUTH TWICE  DAILY End, Christopher, MD  Active   Cholecalciferol (VITAMIN D3) 25 MCG (1000 UT) CAPS [id number] Yes Take 1,000 Units by mouth daily. [provider] Taking Active Self  Coenzyme Q10 (CO Q 10) 100 MG CAPS [id number] Yes Take 100 mg by mouth daily. [provider] Taking Active Self  dapagliflozin propanediol (FARXIGA) 10 MG TABS tablet [id number]  Take 1 tablet (10 mg total) by mouth daily before breakfast. Alba Cory, MD  Active   famotidine (PEPCID) 20 MG tablet [id number] Yes TAKE 1 TABLET BY MOUTH DAILY Sowles, Danna Hefty, MD Taking Active   Insulin Pen Needle 32G X 6 MM MISC [id number]  1 each by Does not apply route daily at 12 noon. Alba Cory, MD  Active   levothyroxine (SYNTHROID) 25 MCG tablet [id number] Yes TAKE 1 TABLET BY MOUTH IN THE  Unice Cobble, Danna Hefty, MD Taking Active   liraglutide (VICTOZA) 18 MG/3ML SOPN [id number] Yes Inject 1.8 mg into the skin daily. Alba Cory, MD Taking Active   losartan (COZAAR) 25 MG tablet [id number] Yes TAKE ONE-HALF TABLET BY MOUTH  DAILY End, Cristal Deer, MD Taking Active   Multiple Vitamins-Minerals (CENTRUM SILVER PO) [id number] Yes Take 1 tablet by mouth daily. [provider] Taking Active Self  nitroGLYCERIN (NITROSTAT) 0.4 MG SL tablet [id number]  DISSOLVE 1 TABLET UNDER THE  TONGUE EVERY 5 MINUTES AS NEEDED FOR CHEST PAIN.  MAX OF 3 TABLETS IN 15 MINUTES. CALL 911 IF PAIN  PERSISTS. End, Christopher, MD  Active   Omega-3 Fatty Acids (FISH OIL) 1000 MG CAPS [id number] Yes Take 1,000 mg by mouth daily. [provider] Taking Active Self  rosuvastatin (CRESTOR) 20 MG tablet [id number] Yes Take 1 tablet (20 mg total) by mouth daily. Alba Cory, MD Taking Active   traZODone (DESYREL) 50 MG tablet [id number] Yes TAKE 1/2 TO 1 TABLET BY MOUTH AT BEDTIME AS NEEDED FOR SLEEP Sowles, Danna Hefty, MD Taking Active   triamcinolone cream (KENALOG) 0.1 % [id number] No Apply 1 application topically 2 (two) times daily.  Patient not taking: Reported on 02/09/2023   Alba Cory, MD Not Taking Active               Assessment/Plan:   Comprehensive medication review performed; medication list updated in electronic medical record - Identify patient in need of refill of carvedilol. From review of dispensing history in chart, appears [facility] last filed this prescription for patient on 02/03/2023.  States will check around the house to see if this has  arrived and, if not, follow up with [facility]  - Counsel on importance of separating administration of multivitamins and levothyroxine by at least 4 hours  Diabetes: - Currently controlled - Reviewed long term cardiovascular and renal outcomes of uncontrolled blood sugar - Reviewed goal A1c, goal fasting, and goal 2 hour post prandial glucose - Reviewed dietary modifications including importance of having regular well-balanced meals and snacks throughout the day, while controlling carbohydrate portion sizes - Recommend to check glucose, keep log of results and have this record to review at upcoming medical appointments. Patient to contact provider office sooner if needed for readings outside of established parameters or symptoms - Collaborating with PCP and CPhT to aid patient with re-enrollment in [place] from AZ&Me and Victoza from [employer] to AZ[employer]Me today on  behalf of patient. Speak with representative Lana who advises: - Patient is approved for enrollment in AZ[employer]Me patient assistance from 01/27/2023-02/22/2024 - Reports current shipment of Marcelline Deist is on its way to patient; shipped from program on 12/10 - Advises patient was not previously enrolled in AZ&Me assistance program in 2024 - Counsel on Farxiga, including mechanism of action, side effects, and benefits. Discussed potential side effects of dehydration, genitourinary infections. Encouraged adequate hydration and genital hygiene. Advised on sick day rules (if a day with significantly reduced oral intake, serious vomiting, or diarrhea, hold SGLT2). Patient verbalized understanding.     Hypertension/Heart Failure: - Reviewed long term cardiovascular and renal outcomes of uncontrolled blood pressure - Recommend to monitor home blood pressure, keep log of results and have this record to review at upcoming medical appointments. Patient to contact provider office sooner if needed for readings outside of established parameters or symptoms    Follow Up Plan: Clinical Pharmacist will follow up with patient by telephone on 03/13/2022 at 11:30 am   Estelle Grumbles, PharmD, [facility] [phone]

## 2023-02-14 NOTE — Patient Instructions (Addendum)
Goals Addressed             This Visit's Progress    Pharmacy Goals       Take Farxiga in the morning, as it can cause more frequent urination with more sugar in the urine. It may worsen risk for dehydration or genital infections. Focus on staying well hydrated and using good genital hygiene. Stop the medication and call our office if you develop any symptoms of genital infections, such as burning, itching, or pain while urinating or itching with redness that could be a yeast infection. If you have a day that you are vomiting or having diarrhea and you are very dehydrated, please hold this medication until you feel better.    The goal A1c is less than 7%. This is the best way to reduce the risk of the long term complications of diabetes, including heart disease, kidney disease, eye disease, strokes, and nerve damage. An A1c of less than 7% corresponds with fasting sugars less than 130 and 2 hour after meal sugars less than 180.   If you need to call Noreene Larsson, you can reach her at [phone]  If you need to reach out to patient assistance programs regarding refills or to find out the status of your application, you can do so by calling:  [employer] at [phone] AZ[employer]Me at [phone]  Thank you!  Estelle Grumbles, PharmD, [facility] [phone]

## 2023-02-21 DIAGNOSIS — E1165 Type 2 diabetes mellitus with hyperglycemia: Secondary | ICD-10-CM | POA: Diagnosis not present

## 2023-03-04 ENCOUNTER — Telehealth: Payer: Self-pay | Admitting: Pharmacy Technician

## 2023-03-04 DIAGNOSIS — Z5986 Financial insecurity: Secondary | ICD-10-CM

## 2023-03-04 NOTE — Progress Notes (Signed)
 Pharmacy Medication Assistance Program Note    03/04/2023  Patient ID: Drew [name], male   DOB: Jan 08, 1941, 83 y.o.   MRN: [id number]     01/10/2023 01/27/2023 03/04/2023  Outreach Medication One  Initial Outreach Date (Medication One) 01/10/2023    Manufacturer Medication One [employer]    [facility] Victoza     Dose of Victoza  18mg /35ml    Type of Radiographer, Therapeutic Assistance    Date Application Sent to Patient 01/12/2023    Application Items Requested Application;Proof of Income;Other    Date Application Sent to Prescriber 01/12/2023    Name of Prescriber Drew [name]    Date Application Received From Patient  01/26/2023   Application Items Received From Patient  Application;Other;Proof of Income   Date Application Received From Provider  01/12/2023   Date Application Submitted to Manufacturer  01/26/2023   Method Application Sent to Manufacturer  Fax   Patient Assistance Determination   Approved  Approval Start Date   02/23/2023  Approval End Date   02/22/2024  Additional Outreach Contact   Provider  Contacted Provider   Message        01/10/2023 01/27/2023 03/04/2023  Outreach Medication Two  Initial Outreach Date (Medication Two) 01/10/2023    Manufacturer Medication Two [employer]    [employer] [facility] Farxiga     Dose of Farxiga  10mg     Type of Radiographer, Therapeutic Assistance    Date Application Sent to Patient 01/12/2023    Application Items Requested Application;Proof of Income;Other    Date Application Sent to Prescriber 01/12/2023    Name of Prescriber Drew [name]    Date Application Received From Patient  01/26/2023   Application Items Received From Patient  Application;Proof of Income;Other   Date Application Received From Provider  01/12/2023   Method Application Sent to Manufacturer  Fax   Date Application Submitted to Manufacturer  01/26/2023   Patient Assistance Determination   Approved  Approval Start Date   02/23/2023  Additional Outreach  Contact   Provider  Contacted Provider   Message   Two care coordination calls placed to AZ[employer]ME in regard to Farxiga  and [employer] in regard to Victoza    Per the AZ&ME IVR system patient is APPROVED 02/23/23/02/22/24 for Farxiga . Medication will auto fill and ship to patient's home. Patient may call AZ[employer]ME at any time to check on next shipment by calling [phone].   Per Lashonda at [employer], patient is APPROVED 02/23/23-02/22/24 for Victoza .  Medication will auto fill and ship to prescriber's office. Patient may call [employer] at any time to check on next shipment by calling [phone].   Will route message to [facility] PHarmD to notify patient at next scheduled office visit.    Signature  Drew [name]  Office: [phone] Fax: [phone] Email: [name]

## 2023-03-14 ENCOUNTER — Other Ambulatory Visit: Payer: Self-pay | Admitting: Pharmacist

## 2023-03-14 DIAGNOSIS — E669 Obesity, unspecified: Secondary | ICD-10-CM

## 2023-03-14 NOTE — Progress Notes (Signed)
03/14/2023 Name: Drew [name] MRN: [id number] DOB: 21-Jan-1941  Chief Complaint  Patient presents with   Medication Assistance   Medication Management    Drew [name] is a 83 y.o. year old male who presented for a telephone visit.   They were referred to the pharmacist for assistance in managing medication access.      Subjective:   Care Team: Primary Care Provider: Alba Cory, MD ; Next Scheduled Visit: 05/09/2023 Cardiologist: Yvonne Kendall, MD Nephrologist: Mosetta Pigeon, MD  Medication Access/Adherence  Current Pharmacy:  [facility]) - [place], [place] - [address] [facility] [address] [place] [address] [place] [address] Phone: [phone] Fax: [phone]  [facility] [address], [place] - [address] [facility] [place] [address] Phone: [phone] Fax: [phone]  [facility] - [place], [place] - [address] [place] [address] Phone: [phone] Fax: [phone]   Patient reports affordability concerns with their medications: No  Patient reports access/transportation concerns to their pharmacy: No  Patient reports adherence concerns with their medications:  No     Reports takes medications from weekly pillbox as filled by his wife     Diabetes:   Current medications:  - Victoza 1.8 mg injection daily  Previous therapies tried: [place] (itching)   Reports started [place] when received it in mail from assistance program. Took for ~1 week, but then started to have itching all over his body (arms, legs and back). Stopped Marcelline Deist for a couple of days and itching resolved. Retrialed and itching started again, so has discontinued    Current glucose readings: before breakfast ranging 69-145   Statin therapy: rosuvastatin 20 mg daily   Current medication access support: Enrolled in patient assistance for Victoza from [employer]  through 02/22/2024    Hypertension/Heart Failure:   Current medications:  ACEi/ARB/ARNI: losartan 25 mg - 1/2 tablet (12.5 mg) daily Beta blocker: carvedilol 3.125 mg twice daily Amlodipine 5 mg - 1/2 (2.5 mg) daily   Patient has an automated, upper arm home BP cuff Current home blood pressure readings: last checked: - Yesterday: 130/76, HR 55   Patient denies hypotensive s/sx including dizziness, lightheadedness as long as takes positional changes slowly    Current physical activity: reports walks his dog ~6 minutes daily. Reports working on gradually walking longer      Objective:  Lab Results  Component Value Date   HGBA1C 5.9 (A) 01/04/2023    Lab Results  Component Value Date   CREATININE 1.36 (H) 08/02/2022   BUN 19 08/02/2022   NA 139 08/02/2022   K 4.1 08/02/2022   CL 106 08/02/2022   CO2 22 08/02/2022    Lab Results  Component Value Date   CHOL 98 08/30/2022   HDL 30 (L) 08/30/2022   LDLCALC 28 08/30/2022   TRIG 379 (H) 08/30/2022   CHOLHDL 3.3 08/30/2022   BP Readings from Last 3 Encounters:  01/05/23 122/60  01/04/23 124/76  09/23/22 122/80   Pulse Readings from Last 3 Encounters:  01/05/23 (!) 57  01/04/23 67  09/23/22 71     Medications Reviewed Today     Reviewed by Manuela Neptune, RPH-CPP (Pharmacist) on 03/14/23 at 1152  Med List Status: <None>   Medication Order Taking? Sig Documenting Provider Last Dose Status Informant  acetaminophen (TYLENOL) 500 MG tablet [id number]  Take 500 mg by mouth every 6 (six)  hours as needed. [provider]  Active Self  albuterol (VENTOLIN HFA) 108 (90 Base) MCG/ACT inhaler [id number]  SMARTSIG:2 Puff(s) By Mouth Every 4 Hours PRN [provider]  Active   allopurinol (ZYLOPRIM) 100 MG tablet [id number]  Take 100 mg by mouth 2 (two) times daily. [provider]  Active Self           Med Note (NEWCOMER [place], BRANDY L   Wed Aug 13, 2020 10:19 AM)    amLODipine (NORVASC)  5 MG tablet [id number]  TAKE 1 TABLET BY MOUTH DAILY  Patient taking differently: Take 2.5 mg by mouth daily.   End, Cristal Deer, MD  Active   aspirin EC 81 MG tablet [id number]  Take 81 mg by mouth daily. [provider]  Active Self  carvedilol (COREG) 3.125 MG tablet [id number]  TAKE 1 TABLET BY MOUTH TWICE  DAILY End, Christopher, MD  Active   Cholecalciferol (VITAMIN D3) 25 MCG (1000 UT) CAPS [id number]  Take 1,000 Units by mouth daily. [provider]  Active Self  Coenzyme Q10 (CO Q 10) 100 MG CAPS [id number]  Take 100 mg by mouth daily. [provider]  Active Self  famotidine (PEPCID) 20 MG tablet [id number]  TAKE 1 TABLET BY MOUTH DAILY Sowles, Danna Hefty, MD  Active   Insulin Pen Needle 32G X 6 MM MISC [id number]  1 each by Does not apply route daily at 12 noon. Alba Cory, MD  Active   levothyroxine (SYNTHROID) 25 MCG tablet [id number]  TAKE 1 TABLET BY MOUTH IN THE  Unice Cobble, Danna Hefty, MD  Active   liraglutide (VICTOZA) 18 MG/3ML SOPN [id number] Yes Inject 1.8 mg into the skin daily. Alba Cory, MD Taking Active   losartan (COZAAR) 25 MG tablet [id number]  TAKE ONE-HALF TABLET BY MOUTH  DAILY End, Cristal Deer, MD  Active   Multiple Vitamins-Minerals (CENTRUM SILVER PO) [id number]  Take 1 tablet by mouth daily. [provider]  Active Self  nitroGLYCERIN (NITROSTAT) 0.4 MG SL tablet [id number]  DISSOLVE 1 TABLET UNDER THE  TONGUE EVERY 5 MINUTES AS NEEDED FOR CHEST PAIN. MAX OF 3 TABLETS IN 15 MINUTES. CALL 911 IF PAIN  PERSISTS. End, Cristal Deer, MD  Active   Omega-3 Fatty Acids (FISH OIL) 1000 MG CAPS [id number]  Take 1,000 mg by mouth daily. [provider]  Active Self  rosuvastatin (CRESTOR) 20 MG tablet [id number]  Take 1 tablet (20 mg total) by mouth daily. Alba Cory, MD  Active   traZODone (DESYREL) 50 MG tablet [id number]  TAKE 1/2 TO 1 TABLET BY MOUTH AT BEDTIME AS NEEDED FOR SLEEP Sowles, Danna Hefty, MD  Active   triamcinolone cream  (KENALOG) 0.1 % [id number]  Apply 1 application topically 2 (two) times daily.  Patient not taking: Reported on 02/09/2023   Alba Cory, MD  Active               Assessment/Plan:    Diabetes: - Currently controlled - Reviewed long term cardiovascular and renal outcomes of uncontrolled blood sugar - Reviewed goal A1c, goal fasting, and goal 2 hour post prandial glucose - Reviewed dietary modifications including importance of having regular well-balanced meals and snacks throughout the day, while controlling carbohydrate portion sizes - Recommend to check glucose, keep log of results and have this record to review at upcoming medical appointments. Patient to contact provider office sooner if needed for readings outside of established parameters or symptoms - Patient to follow up with [employer] patient assistance program as needed  for refills of Victoza - Patient plans to contact AZ[employer]Me patient assistance program today to cancel further shipments of Farxiga     Hypertension/Heart Failure: - Reviewed long term cardiovascular and renal outcomes of uncontrolled blood pressure - Recommend to monitor home blood pressure, keep log of results and have this record to review at upcoming medical appointments. Patient to contact provider office sooner if needed for readings outside of established parameters or symptoms       Follow Up Plan: Clinical Pharmacist will follow up with patient by telephone on 08/10/2023 at 10:00 AM    Estelle Grumbles, PharmD, [facility] [phone]

## 2023-03-14 NOTE — Patient Instructions (Signed)
Goals Addressed             This Visit's Progress    Pharmacy Goals       The goal A1c is less than 7%. This is the best way to reduce the risk of the long term complications of diabetes, including heart disease, kidney disease, eye disease, strokes, and nerve damage. An A1c of less than 7% corresponds with fasting sugars less than 130 and 2 hour after meal sugars less than 180.   If you need to reach out to patient assistance programs regarding refills or to find out the status of your application, you can do so by calling:  [employer] at [phone]  Thank you!  Estelle Grumbles, PharmD, [facility] [phone]

## 2023-03-23 DIAGNOSIS — E1165 Type 2 diabetes mellitus with hyperglycemia: Secondary | ICD-10-CM | POA: Diagnosis not present

## 2023-03-31 ENCOUNTER — Other Ambulatory Visit: Payer: Self-pay | Admitting: Internal Medicine

## 2023-04-11 ENCOUNTER — Encounter: Payer: Self-pay | Admitting: Pharmacist

## 2023-04-11 ENCOUNTER — Other Ambulatory Visit: Payer: Self-pay | Admitting: Pharmacist

## 2023-04-11 DIAGNOSIS — E669 Obesity, unspecified: Secondary | ICD-10-CM

## 2023-04-11 NOTE — Progress Notes (Deleted)
   04/11/2023  Patient ID: Drew [name], male   DOB: 06/05/1940, 83 y.o.   MRN: [id number]  Receive a message from CPhT Pattricia Boss advising that per [employer], the program will no longer be supplying Victoza.  Place call to [employer] on behalf of patient. Speak with representative Dionna who shares that effective 04/06/2023 [employer] will no longer fulfill orders/supply.

## 2023-04-11 NOTE — Progress Notes (Signed)
   04/11/2023  Patient ID: Drew [name], male   DOB: 05-13-1940, 83 y.o.   MRN: [id number]  Receive a message from CPhT Pattricia Boss advising that per [employer], the program will no longer be supplying Victoza.   Place call to [employer] on behalf of patient. Speak with representative Dionna who shares that effective 04/06/2023 [employer] will no longer fulfill orders/supply.

## 2023-04-12 NOTE — Patient Instructions (Signed)
 Goals Addressed             This Visit's Progress    Pharmacy Goals       The goal A1c is less than 7%. This is the best way to reduce the risk of the long term complications of diabetes, including heart disease, kidney disease, eye disease, strokes, and nerve damage. An A1c of less than 7% corresponds with fasting sugars less than 130 and 2 hour after meal sugars less than 180.   If you need to reach out to patient assistance programs regarding refills or to find out the status of your application, you can do so by calling:  [employer] at [phone]  Thank you!  Estelle Grumbles, PharmD, [facility] [phone]

## 2023-04-12 NOTE — Progress Notes (Signed)
Form filled out and sent.  

## 2023-04-22 DIAGNOSIS — E1165 Type 2 diabetes mellitus with hyperglycemia: Secondary | ICD-10-CM | POA: Diagnosis not present

## 2023-04-28 ENCOUNTER — Other Ambulatory Visit: Payer: Self-pay | Admitting: Pharmacist

## 2023-04-28 DIAGNOSIS — E669 Obesity, unspecified: Secondary | ICD-10-CM

## 2023-04-28 NOTE — Patient Instructions (Signed)
 Goals Addressed             This Visit's Progress    Pharmacy Goals       The goal A1c is less than 7%. This is the best way to reduce the risk of the long term complications of diabetes, including heart disease, kidney disease, eye disease, strokes, and nerve damage. An A1c of less than 7% corresponds with fasting sugars less than 130 and 2 hour after meal sugars less than 180.   If you need to reach out to patient assistance programs regarding refills or to find out the status of your application, you can do so by calling:  [employer] at [phone]  Thank you!  Estelle Grumbles, PharmD, [facility] [phone]

## 2023-04-28 NOTE — Progress Notes (Signed)
   04/28/2023  Patient ID: [name], male   DOB: 11-28-1940, 83 y.o.   MRN: [id number]   Outreach to [employer] on behalf of patient today to follow up regarding switch from Victoza to [employer] (due to availability of product from patient assistance program). Note change request form was faxed to program by office on 04/11/2023.  Today [employer] confirms receipt of Ozempic prescription and advises order went to processing on 04/13/2023. Expected to ship to patient within 10-14 business days of start of processing. - Follow up with patient to provide update. Patient states still has >3 month supply of Victoza remaining and plans to use this up before starting Ozempic 1 mg WEEKLY, as prescribed by PCP  Follow Up Plan: Clinical Pharmacist will follow up with patient by telephone on 08/10/2023 at 10:00 AM    Estelle Grumbles, PharmD, [facility] [phone]

## 2023-05-09 ENCOUNTER — Ambulatory Visit: Payer: Self-pay | Admitting: Family Medicine

## 2023-05-16 ENCOUNTER — Encounter: Payer: Self-pay | Admitting: Family Medicine

## 2023-05-16 ENCOUNTER — Ambulatory Visit: Admitting: Family Medicine

## 2023-05-16 VITALS — BP 118/72 | HR 69 | Resp 16 | Ht 68.0 in | Wt 221.5 lb

## 2023-05-16 DIAGNOSIS — E1122 Type 2 diabetes mellitus with diabetic chronic kidney disease: Secondary | ICD-10-CM

## 2023-05-16 DIAGNOSIS — E034 Atrophy of thyroid (acquired): Secondary | ICD-10-CM

## 2023-05-16 DIAGNOSIS — D696 Thrombocytopenia, unspecified: Secondary | ICD-10-CM | POA: Diagnosis not present

## 2023-05-16 DIAGNOSIS — I700 Atherosclerosis of aorta: Secondary | ICD-10-CM

## 2023-05-16 DIAGNOSIS — E1142 Type 2 diabetes mellitus with diabetic polyneuropathy: Secondary | ICD-10-CM

## 2023-05-16 DIAGNOSIS — I152 Hypertension secondary to endocrine disorders: Secondary | ICD-10-CM

## 2023-05-16 DIAGNOSIS — N1831 Chronic kidney disease, stage 3a: Secondary | ICD-10-CM | POA: Diagnosis not present

## 2023-05-16 DIAGNOSIS — G4733 Obstructive sleep apnea (adult) (pediatric): Secondary | ICD-10-CM

## 2023-05-16 DIAGNOSIS — E669 Obesity, unspecified: Secondary | ICD-10-CM

## 2023-05-16 DIAGNOSIS — G4709 Other insomnia: Secondary | ICD-10-CM

## 2023-05-16 DIAGNOSIS — E1169 Type 2 diabetes mellitus with other specified complication: Secondary | ICD-10-CM

## 2023-05-16 DIAGNOSIS — I2089 Other forms of angina pectoris: Secondary | ICD-10-CM

## 2023-05-16 DIAGNOSIS — I5032 Chronic diastolic (congestive) heart failure: Secondary | ICD-10-CM

## 2023-05-16 DIAGNOSIS — M109 Gout, unspecified: Secondary | ICD-10-CM

## 2023-05-16 DIAGNOSIS — E1159 Type 2 diabetes mellitus with other circulatory complications: Secondary | ICD-10-CM

## 2023-05-16 LAB — POCT GLYCOSYLATED HEMOGLOBIN (HGB A1C): Hemoglobin A1C: 6.1 % — AB (ref 4.0–5.6)

## 2023-05-16 MED ORDER — ROSUVASTATIN CALCIUM 20 MG PO TABS: 20.0000 mg | ORAL_TABLET | Freq: Every day | ORAL | 1 refills | Status: DC

## 2023-05-16 MED ORDER — AMLODIPINE BESYLATE 2.5 MG PO TABS: 2.5000 mg | ORAL_TABLET | Freq: Every day | ORAL | 1 refills | Status: DC

## 2023-05-16 MED ORDER — TRAZODONE HCL 50 MG PO TABS: 25.0000 mg | ORAL_TABLET | Freq: Every evening | ORAL | 3 refills | Status: DC | PRN

## 2023-05-16 NOTE — Progress Notes (Signed)
 Name: Drew [name]   MRN: [id number]    DOB: 07-Aug-1940   Date:05/16/2023       Progress Note  Subjective  Chief Complaint  Chief Complaint  Patient presents with   Medical Management of Chronic Issues   HPI   DMII: he has obesity, dyslipidemia, HTN and CKI. His weight is going down, but BMI is now below 35, A1C is at goal, today was 6.1 %  He is only on Victoza, he stopped [place] due to pruritus. Denies polyphagia, polyuria or polydipsia.  He is on ARB and statin therapy. He denies side effects of medication . He has occasional neuropathic pain on feet    Thrombocytopenia: reviewed last labs, stable    OSA: he is complaint with CPAP and states doing well, he occasionally woke up past couple of days with a headache, he figured out why his mask was leaking and doing better now.    HTN/CHF with preserved EF : bp is towards low end of normal again, wife brought all medications and he is taking losartan 25 mg half pill , carvedilol 3.125 mg BID and norvasc  half of 5 mg and we will change to 2.5 mg dose  He has intermittent chest pain but not lately  He sees Dr. Okey Dupre - cardiologist , he has stable orthopnea - uses 2 pillows, wheezing but he has intermittent sob with moderate activity that has been stable . He has intermittent lower extremity edema  He is off SGL-2 agonist due to side effect   Dyslipidemia: he is doing well on medication, he is compliant, last LDL was down to 44. He is under the care of Dr. Okey Dupre.     Stable Angina/Atherosclerosis aorta : he is  under the care of Dr. Okey Dupre he does not have NTG at home. He states he has been doing well lately, taking higher dose of Crestor and aspirin daily, he is also on a beta blocker . LDL is at goal. He states no recent episodes of chest pain and has been walking in his yard for exercise , he has noticed some orthostatic changes and we will change rx of norvasc to 2.5 mg since he is supposed to be taking half dose of norvasc 5 mg at this time     Obesity/HTN/DM :  BMI is now below  35 and stable , he is trying to lose more weight , he is avoiding going back for seconds    Controlled gout: on allopurinol , no recent episodes, uric acid below 6. 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 Thedore Mins, no pruritus, good urine output, last GFR was 52 and stable. Up to date with labs and visit    Hypothyroidism: on the same dose of levothyroxine , denies dysphagia, dry skin or change in bowel movements, last TSH at goal . He is no longer taking famotidine with levothyroxine . WE will recheck labs yearly   Patient Active Problem List   Diagnosis Date Noted   Controlled gout 10/19/2021   Diabetic polyneuropathy associated with type 2 diabetes mellitus (HCC) 10/19/2021   Insomnia 05/12/2021   Coronary artery disease of native artery of native heart with stable angina pectoris (HCC) 08/14/2020   Aortic atherosclerosis (HCC) 08/14/2020   Atrophy of kidney 10/10/2019   Edema of lower extremity 10/10/2019   Thrombocytopenia (HCC) 08/13/2019   Type 2 diabetes mellitus with obesity (HCC) 08/13/2019   Hyperlipidemia associated with type 2  diabetes mellitus (HCC) 08/13/2019   Neuropathy involving both lower extremities 05/04/2019   Polyneuropathy, unspecified 05/04/2019   Multiple renal cysts 04/09/2019   Stable angina (HCC) 12/29/2018   Onychomycosis of multiple toenails with type 2 diabetes mellitus (HCC) 11/30/2017   Chronic heart failure with preserved ejection fraction (HCC) 10/20/2017   OSA on CPAP 02/10/2017   Abnormal ankle brachial index (ABI) 11/26/2015   Stage 3a chronic kidney disease (HCC) 08/15/2015   H/O: upper GI bleed    Hypothyroidism due to acquired atrophy of thyroid 03/11/2015   History of prostatitis 11/06/2014   Hypertension associated with type 2 diabetes mellitus (HCC) 11/06/2014   Mixed hyperlipidemia 11/06/2014   Flat feet, bilateral 11/06/2014   Chronic multiple gastric erosions 11/06/2014    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) 11/06/2014    Past Surgical History:  Procedure Laterality Date   CATARACT EXTRACTION, BILATERAL Bilateral 11/18/2017   COLONOSCOPY N/A 12/27/2013   Procedure: COLONOSCOPY;  Surgeon: Barrie Folk, MD;  Location[facility] ENDOSCOPY;  Service: Endoscopy;  Laterality: N/A;   ENTEROSCOPY N/A 12/21/2013   Procedure: ENTEROSCOPY;  Surgeon: Vertell Novak., MD;  Location: [facility] ENDOSCOPY;  Service: Endoscopy;  Laterality: N/A;   ENTEROSCOPY N/A 12/27/2013   Procedure: ENTEROSCOPY;  Surgeon: Barrie Folk, MD;  Location: [facility] ENDOSCOPY;  Service: Endoscopy;  Laterality: N/A;    Family History  Problem Relation Age of Onset   Hyperlipidemia Father    Kidney disease Father    Heart disease Father    Congestive Heart Failure Mother    Heart disease Mother    Hyperlipidemia Daughter    Hyperlipidemia Son    Diabetes Son    Anxiety disorder Daughter    Depression Daughter    Arthritis Daughter     Social History   Tobacco Use   Smoking status: Former    Current packs/day: 0.00    Types: Cigarettes, Cigars    Start date: 42    Quit date: 1990    Years since quitting: 35.2   Smokeless tobacco: Never   Tobacco comments:    Smoked afternoon cigar  Substance Use Topics   Alcohol use: No     Current Outpatient Medications:    acetaminophen (TYLENOL) 500 MG tablet, Take 500 mg by mouth every 6 (six) hours as needed., Disp: , Rfl:    albuterol (VENTOLIN HFA) 108 (90 Base) MCG/ACT inhaler, SMARTSIG:2 Puff(s) By Mouth Every 4 Hours PRN, Disp: , Rfl:    allopurinol (ZYLOPRIM) 100 MG tablet, Take 100 mg by mouth 2 (two) times daily., Disp: , Rfl:    amLODipine (NORVASC) 2.5 MG tablet, Take 1 tablet (2.5 mg total) by mouth daily. New dose take full pill, Disp: 90 tablet, Rfl: 1   aspirin EC 81 MG tablet, Take 81 mg by mouth daily., Disp: , Rfl:    carvedilol (COREG) 3.125 MG tablet, TAKE 1 TABLET BY MOUTH TWICE  DAILY, Disp:  180 tablet, Rfl: 3   Cholecalciferol (VITAMIN D3) 25 MCG (1000 UT) CAPS, Take 1,000 Units by mouth daily., Disp: , Rfl:    Coenzyme Q10 (CO Q 10) 100 MG CAPS, Take 100 mg by mouth daily., Disp: , Rfl:    famotidine (PEPCID) 20 MG tablet, TAKE 1 TABLET BY MOUTH DAILY, Disp: 80 tablet, Rfl: 3   Insulin Pen Needle 32G X 6 MM MISC, 1 each by Does not apply route daily at 12 noon., Disp: 100 each, Rfl: 1   levothyroxine (SYNTHROID)  25 MCG tablet, TAKE 1 TABLET BY MOUTH IN THE  MORNING, Disp: 100 tablet, Rfl: 2   liraglutide (VICTOZA) 18 MG/3ML SOPN, Inject 1.8 mg into the skin daily., Disp: 9 mL, Rfl: 3   losartan (COZAAR) 25 MG tablet, TAKE ONE-HALF TABLET BY MOUTH  DAILY, Disp: 45 tablet, Rfl: 6   Multiple Vitamins-Minerals (CENTRUM SILVER PO), Take 1 tablet by mouth daily., Disp: , Rfl:    nitroGLYCERIN (NITROSTAT) 0.4 MG SL tablet, DISSOLVE 1 TABLET UNDER THE  TONGUE EVERY 5 MINUTES AS NEEDED FOR CHEST PAIN. MAX OF 3 TABLETS IN 15 MINUTES. CALL 911 IF PAIN  PERSISTS., Disp: 50 tablet, Rfl: 7   Omega-3 Fatty Acids (FISH OIL) 1000 MG CAPS, Take 1,000 mg by mouth daily., Disp: , Rfl:    triamcinolone cream (KENALOG) 0.1 %, Apply 1 application topically 2 (two) times daily., Disp: 30 g, Rfl: 0   rosuvastatin (CRESTOR) 20 MG tablet, Take 1 tablet (20 mg total) by mouth daily., Disp: 100 tablet, Rfl: 1   traZODone (DESYREL) 50 MG tablet, Take 0.5-1 tablets (25-50 mg total) by mouth at bedtime as needed. for sleep, Disp: 90 tablet, Rfl: 3  Allergies  Allergen Reactions   Farxiga [Dapagliflozin] Itching   Niacin And Related Itching and Rash    I personally reviewed active problem list, medication list, allergies with the patient/caregiver today.   ROS  Ten systems reviewed and is negative except as mentioned in HPI    Objective  Vitals:   05/16/23 1009  BP: 118/72  Pulse: 69  Resp: 16  SpO2: 94%  Weight: 221 lb 8 oz (100.5 kg)  Height: 5\' 8"  (1.727 m)    Body mass index is 33.68  kg/m.  Physical Exam  Constitutional: Patient appears well-developed and well-nourished. Obese  No distress.  HEENT: head atraumatic, normocephalic, pupils equal and reactive to light, neck supple Cardiovascular: Normal rate, regular rhythm and normal heart sounds.  No murmur heard. No BLE edema. Pulmonary/Chest: Effort normal and breath sounds normal. No respiratory distress. Abdominal: Soft.  There is no tenderness. Psychiatric: Patient has a normal mood and affect. behavior is normal. Judgment and thought content normal.   Recent Results (from the past 2160 hours)  POCT glycosylated hemoglobin (Hb A1C)     Status: Abnormal   Collection Time: 05/16/23 10:15 AM  Result Value Ref Range   Hemoglobin A1C 6.1 (A) 4.0 - 5.6 %   HbA1c POC (<> result, manual entry)     HbA1c, POC (prediabetic range)     HbA1c, POC (controlled diabetic range)      Diabetic Foot Exam:     PHQ2/9:    05/16/2023   10:08 AM 01/04/2023   10:43 AM 11/03/2022   11:58 AM 08/30/2022   10:42 AM 05/27/2022   10:40 AM  Depression screen PHQ 2/9  Decreased Interest 0 0 0 0 0  Down, Depressed, Hopeless 0 0 0 0 0  PHQ - 2 Score 0 0 0 0 0  Altered sleeping 0 0  0 0  Tired, decreased energy 0 0  0 0  Change in appetite 0 0  0   Feeling bad or failure about yourself  0 0  0 0  Trouble concentrating 0 0  0 0  Moving slowly or fidgety/restless 0 0  0 0  Suicidal thoughts 0 0  0 0  PHQ-9 Score 0 0  0 0  Difficult doing work/chores Not difficult at all  phq 9 is negative  Fall Risk:    05/16/2023    9:58 AM 01/04/2023   10:43 AM 08/30/2022   10:41 AM 05/27/2022   10:36 AM 04/26/2022   10:18 AM  Fall Risk   Falls in the past year? 0 1 0 1 1  Comment    fell in yard in Feb'24 no injuries   Number falls in past yr: 0 0  0 0  Injury with Fall? 0 0  0 0  Risk for fall due to : No Fall Risks History of fall(s) No Fall Risks No Fall Risks History of fall(s)  Follow up Falls prevention discussed;Education  provided;Falls evaluation completed Falls prevention discussed;Education provided;Falls evaluation completed Falls prevention discussed Education provided;Falls prevention discussed Falls prevention discussed;Falls evaluation completed;Education provided     Assessment & Plan  1. Type 2 diabetes mellitus with stage 3a chronic kidney disease, without long-term current use of insulin (HCC) (Primary)  - POCT glycosylated hemoglobin (Hb A1C)  2. Aortic atherosclerosis (HCC)  - rosuvastatin (CRESTOR) 20 MG tablet; Take 1 tablet (20 mg total) by mouth daily.  Dispense: 100 tablet; Refill: 1  3. Thrombocytopenia (HCC)  stable  4. Stable angina (HCC)  - rosuvastatin (CRESTOR) 20 MG tablet; Take 1 tablet (20 mg total) by mouth daily.  Dispense: 100 tablet; Refill: 1  5. Chronic heart failure with preserved ejection fraction (HCC)  Under the care of cardiologist  6. Stage 3a chronic kidney disease (HCC)  Under the care of Dr. Thedore Mins  7. Diabetic polyneuropathy associated with type 2 diabetes mellitus (HCC)  Stable, A1C is at goal   8. OSA on CPAP  Compliant   9. Hypothyroidism due to acquired atrophy of thyroid  Last level at goal, recheck yearly   10. Controlled gout  Recheck in July   11. Obesity, diabetes, and hypertension syndrome (HCC)  - amLODipine (NORVASC) 2.5 MG tablet; Take 1 tablet (2.5 mg total) by mouth daily. New dose take full pill  Dispense: 90 tablet; Refill: 1  12. Other insomnia  - traZODone (DESYREL) 50 MG tablet; Take 0.5-1 tablets (25-50 mg total) by mouth at bedtime as needed. for sleep  Dispense: 90 tablet; Refill: 3

## 2023-05-17 NOTE — Addendum Note (Signed)
 Addended by: Alba Cory F on: 05/17/2023 12:45 PM   Modules accepted: Level of Service

## 2023-06-02 ENCOUNTER — Ambulatory Visit: Payer: Medicare Other

## 2023-06-02 DIAGNOSIS — Z0000 Encounter for general adult medical examination without abnormal findings: Secondary | ICD-10-CM

## 2023-06-02 NOTE — Progress Notes (Signed)
 Subjective:   Drew [name] is a 83 y.o. who presents for a Medicare Wellness preventive visit.  Visit Complete: Virtual I connected with  Drew [name] on 06/02/23 by a audio enabled telemedicine application and verified that I am speaking with the correct person using two identifiers.  Patient Location: Home  Provider Location: Office/Clinic  I discussed the limitations of evaluation and management by telemedicine. The patient expressed understanding and agreed to proceed.  Vital Signs: Because this visit was a virtual/telehealth visit, some criteria may be missing or patient reported. Any vitals not documented were not able to be obtained and vitals that have been documented are patient reported.  VideoDeclined- This patient declined Librarian, academic. Therefore the visit was completed with audio only.  Persons Participating in Visit: Patient.  AWV Questionnaire: No: Patient Medicare AWV questionnaire was not completed prior to this visit.  Cardiac Risk Factors include: advanced age (>85men, >65 women);hypertension;male gender;dyslipidemia;diabetes mellitus;sedentary lifestyle;obesity (BMI >30kg/m2)     Objective:    There were no vitals filed for this visit. There is no height or weight on file to calculate BMI.     06/02/2023    8:22 AM 05/27/2022   10:43 AM 04/16/2021    2:29 PM 04/15/2020    2:23 PM 03/01/2019    2:14 PM 01/27/2018    2:16 PM 11/30/2016    8:41 AM  Advanced Directives  Does Patient Have a Medical Advance Directive? No No No No Yes No No  Type of Agricultural consultant;Living will    Copy of Healthcare Power of Attorney in Chart?     No - copy requested    Would patient like information on creating a medical advance directive? No - Patient declined  Yes (MAU/Ambulatory/Procedural Areas - Information given) Yes (MAU/Ambulatory/Procedural Areas - Information given)  Yes (MAU/Ambulatory/Procedural Areas -  Information given)     Current Medications (verified) Outpatient Encounter Medications as of 06/02/2023  Medication Sig   acetaminophen (TYLENOL) 500 MG tablet Take 500 mg by mouth every 6 (six) hours as needed.   albuterol (VENTOLIN HFA) 108 (90 Base) MCG/ACT inhaler SMARTSIG:2 Puff(s) By Mouth Every 4 Hours PRN   allopurinol (ZYLOPRIM) 100 MG tablet Take 100 mg by mouth 2 (two) times daily.   amLODipine (NORVASC) 2.5 MG tablet Take 1 tablet (2.5 mg total) by mouth daily. New dose take full pill   aspirin EC 81 MG tablet Take 81 mg by mouth daily.   carvedilol (COREG) 3.125 MG tablet TAKE 1 TABLET BY MOUTH TWICE  DAILY   Cholecalciferol (VITAMIN D3) 25 MCG (1000 UT) CAPS Take 1,000 Units by mouth daily.   Coenzyme Q10 (CO Q 10) 100 MG CAPS Take 100 mg by mouth daily.   famotidine (PEPCID) 20 MG tablet TAKE 1 TABLET BY MOUTH DAILY   Insulin Pen Needle 32G X 6 MM MISC 1 each by Does not apply route daily at 12 noon.   levothyroxine (SYNTHROID) 25 MCG tablet TAKE 1 TABLET BY MOUTH IN THE  MORNING   liraglutide (VICTOZA) 18 MG/3ML SOPN Inject 1.8 mg into the skin daily.   losartan (COZAAR) 25 MG tablet TAKE ONE-HALF TABLET BY MOUTH  DAILY   Multiple Vitamins-Minerals (CENTRUM SILVER PO) Take 1 tablet by mouth daily.   nitroGLYCERIN (NITROSTAT) 0.4 MG SL tablet DISSOLVE 1 TABLET UNDER THE  TONGUE EVERY 5 MINUTES AS NEEDED FOR CHEST PAIN. MAX OF 3 TABLETS IN 15 MINUTES.  CALL 911 IF PAIN  PERSISTS.   Omega-3 Fatty Acids (FISH OIL) 1000 MG CAPS Take 1,000 mg by mouth daily.   rosuvastatin (CRESTOR) 20 MG tablet Take 1 tablet (20 mg total) by mouth daily.   traZODone (DESYREL) 50 MG tablet Take 0.5-1 tablets (25-50 mg total) by mouth at bedtime as needed. for sleep   triamcinolone cream (KENALOG) 0.1 % Apply 1 application topically 2 (two) times daily.   No facility-administered encounter medications on file as of 06/02/2023.    Allergies (verified) Farxiga [dapagliflozin] and Niacin and related    History: Past Medical History:  Diagnosis Date   (HFpEF) heart failure with preserved ejection fraction (HCC)    a. 02/2016 Echo: EF nl. Gr1 DD. Mild AI. Mod thickened AoV w/o stenosis. Nl RV size and fxn.   Aortic atherosclerosis (HCC)    CAD (coronary artery disease)    a. 12/2019 Cor CTA: LM nl, LAD 25-49p/m, LCX small, nondom, nl, RCA large, dom, 50-69p (nl FFR), 25-68m, Ca2+ = 1602 (79th%'ile)-->Med rx.   Chronic kidney disease, stage III (moderate) (HCC) 08/15/2015   Diabetes mellitus with complication (HCC)    GERD (gastroesophageal reflux disease)    H/O: upper GI bleed 2015   Hiatal hernia    a. 12/2019 Moderate-sized HH incidentally noted on Cor CTA.   History of stress test    a. 05/2016 Lexiscan MV: EF 72%, No ischemia-->Low risk.   Hypercholesteremia    Hypertension    Obesity (BMI 30-39.9) 08/07/2015   Peripheral vascular disease (HCC) 12/26/2015   Past Surgical History:  Procedure Laterality Date   CATARACT EXTRACTION, BILATERAL Bilateral 11/18/2017   COLONOSCOPY N/A 12/27/2013   Procedure: COLONOSCOPY;  Surgeon: Barrie Folk, MD;  Location: [facility] ENDOSCOPY;  Service: Endoscopy;  Laterality: N/A;   ENTEROSCOPY N/A 12/21/2013   Procedure: ENTEROSCOPY;  Surgeon: Vertell Novak., MD;  Location: [facility] ENDOSCOPY;  Service: Endoscopy;  Laterality: N/A;   ENTEROSCOPY N/A 12/27/2013   Procedure: ENTEROSCOPY;  Surgeon: Barrie Folk, MD;  Location: [facility] ENDOSCOPY;  Service: Endoscopy;  Laterality: N/A;   Family History  Problem Relation Age of Onset   Hyperlipidemia Father    Kidney disease Father    Heart disease Father    Congestive Heart Failure Mother    Heart disease Mother    Hyperlipidemia Daughter    Hyperlipidemia Son    Diabetes Son    Anxiety disorder Daughter    Depression Daughter    Arthritis Daughter    Social History   Socioeconomic History   Marital status: Married    Spouse name: Not on file   Number of children: 4   Years of education: Not on file    Highest education level: High school graduate  Occupational History   Occupation: retired  Tobacco Use   Smoking status: Former    Current packs/day: 0.00    Types: Cigarettes, Cigars    Start date: 1989    Quit date: 1990    Years since quitting: 35.2   Smokeless tobacco: Never   Tobacco comments:    Smoked afternoon cigar  Vaping Use   Vaping status: Never Used  Substance and Sexual Activity   Alcohol use: No   Drug use: No   Sexual activity: Yes  Other Topics Concern   Not on file  Social History Narrative   Not on file   Social Drivers of Health   Financial Resource Strain: Low Risk  (06/02/2023)   Overall Physicist, medical Strain (  CARDIA)    Difficulty of Paying Living Expenses: Not hard at all  Food Insecurity: No Food Insecurity (06/02/2023)   Hunger Vital Sign    Worried About Running Out of Food in the Last Year: Never true    Ran Out of Food in the Last Year: Never true  Transportation Needs: No Transportation Needs (06/02/2023)   PRAPARE - Administrator, Civil Service (Medical): No    Lack of Transportation (Non-Medical): No  Physical Activity: Insufficiently Active (06/02/2023)   Exercise Vital Sign    Days of Exercise per Week: 2 days    Minutes of Exercise per Session: 30 min  Stress: No Stress Concern Present (06/02/2023)   [employer] of Occupational Health - Occupational Stress Questionnaire    Feeling of Stress : Not at all  Social Connections: Moderately Integrated (06/02/2023)   Social Connection and Isolation Panel [NHANES]    Frequency of Communication with Friends and Family: More than three times a week    Frequency of Social Gatherings with Friends and Family: More than three times a week    Attends Religious Services: More than 4 times per year    Active Member of [employer] or Organizations: No    Attends Engineer, structural: Never    Marital Status: Married    Tobacco Counseling Counseling given: Not  Answered Tobacco comments: Smoked afternoon cigar    Clinical Intake:  Pre-visit preparation completed: Yes  Pain : No/denies pain     BMI - recorded: 33.6 Nutritional Status: BMI > 30  Obese Nutritional Risks: None Diabetes: Yes CBG done?: No Did pt. bring in CBG monitor from home?: No  Lab Results  Component Value Date   HGBA1C 6.1 (A) 05/16/2023   HGBA1C 5.9 (A) 01/04/2023   HGBA1C 5.9 (A) 08/30/2022     How often do you need to have someone help you when you read instructions, pamphlets, or other written materials from your doctor or pharmacy?: 1 - Never  Interpreter Needed?: No  Information entered by :: Kennedy Bucker, LPN   Activities of Daily Living    06/02/2023    8:23 AM 01/04/2023   10:43 AM  In your present state of health, do you have any difficulty performing the following activities:  Hearing? 0 0  Vision? 0 0  Difficulty concentrating or making decisions? 0 0  Walking or climbing stairs? 0 0  Dressing or bathing? 0 0  Doing errands, shopping? 0 0  Preparing Food and eating ? N   Using the Toilet? N   In the past six months, have you accidently leaked urine? N   Do you have problems with loss of bowel control? N   Managing your Medications? N   Managing your Finances? N   Housekeeping or managing your Housekeeping? N     Patient Care Team: Alba Cory, MD as PCP - General (Family Medicine) End, Cristal Deer, MD as PCP - Cardiology (Cardiology) Mosetta Pigeon, MD (Nephrology) Erin Fulling, MD as Consulting Physician (Pulmonary Disease) End, Cristal Deer, MD as Consulting Physician (Cardiology) Ronney Asters, Jackelyn Poling, RPH-CPP as Pharmacist  Indicate any recent Medical Services you may have received from other than [facility] providers in the past year (date may be approximate).     Assessment:   This is a routine wellness examination for Farrel.  Hearing/Vision screen Hearing Screening - Comments:: NO AIDS Vision Screening - Comments::  READERS, HAD CATARACT SGY- MD [facility]   Goals Addressed  This Visit's Progress    DIET - INCREASE WATER INTAKE         Depression Screen     06/02/2023    8:18 AM 05/16/2023   10:08 AM 01/04/2023   10:43 AM 11/03/2022   11:58 AM 08/30/2022   10:42 AM 05/27/2022   10:40 AM 04/26/2022   10:18 AM  PHQ 2/9 Scores  PHQ - 2 Score 0 0 0 0 0 0 0  PHQ- 9 Score 0 0 0  0 0 0    Fall Risk     06/02/2023    8:23 AM 05/16/2023    9:58 AM 01/04/2023   10:43 AM 08/30/2022   10:41 AM 05/27/2022   10:36 AM  Fall Risk   Falls in the past year? 1 0 1 0 1  Comment     fell in yard in [place] no injuries  Number falls in past yr: 0 0 0  0  Injury with Fall? 0 0 0  0  Risk for fall due to :  No Fall Risks History of fall(s) No Fall Risks No Fall Risks  Follow up Falls prevention discussed;Falls evaluation completed Falls prevention discussed;Education provided;Falls evaluation completed Falls prevention discussed;Education provided;Falls evaluation completed Falls prevention discussed Education provided;Falls prevention discussed    MEDICARE RISK AT HOME:  Medicare Risk at Home Any stairs in or around the home?: Yes If so, are there any without handrails?: No Home free of loose throw rugs in walkways, pet beds, electrical cords, etc?: Yes Adequate lighting in your home to reduce risk of falls?: Yes Life alert?: No Use of a cane, walker or w/c?: Yes (CANE OCCASIONALLY) Grab bars in the bathroom?: No Shower chair or bench in shower?: No Elevated toilet seat or a handicapped toilet?: No  TIMED UP AND GO:  Was the test performed?  No  Cognitive Function: 6CIT completed        06/02/2023    8:26 AM 05/27/2022   10:47 AM 04/15/2020    2:31 PM 03/01/2019    2:20 PM 01/27/2018    2:33 PM  6CIT Screen  What Year? 0 points 0 points 0 points 0 points 0 points  What month? 0 points 0 points 0 points 0 points 0 points  What time? 0 points 0 points 0 points 0 points 0 points  Count back  from 20 0 points 0 points 0 points 0 points 0 points  Months in reverse 0 points 4 points 2 points 0 points 0 points  Repeat phrase 2 points 0 points 4 points 2 points 2 points  Total Score 2 points 4 points 6 points 2 points 2 points    Immunizations Immunization History  Administered Date(s) Administered   Fluad Quad(high Dose 65+) 11/13/2019, 12/18/2020, 12/07/2021   Fluad Trivalent(High Dose 65+) 01/04/2023   Influenza, High Dose Seasonal PF 11/06/2014, 11/24/2015, 11/30/2016, 11/30/2017   Influenza-Unspecified 12/29/2018   PFIZER(Purple Top)SARS-COV-2 Vaccination 04/05/2019, 04/30/2019, 12/24/2019   Pneumococcal Conjugate-13 12/14/2013   Pneumococcal Polysaccharide-23 10/02/2009   Tdap 10/14/2010, 06/16/2020   Zoster Recombinant(Shingrix) 07/25/2012, 11/27/2020, 04/02/2021    Screening Tests Health Maintenance  Topic Date Due   OPHTHALMOLOGY EXAM  06/24/2020   COVID-19 Vaccine (4 - 2024-25 season) 10/24/2022   Diabetic kidney evaluation - eGFR measurement  08/02/2023   Diabetic kidney evaluation - Urine ACR  08/02/2023   FOOT EXAM  08/30/2023   INFLUENZA VACCINE  09/23/2023   HEMOGLOBIN A1C  11/16/2023   Medicare Annual Wellness (AWV)  06/01/2024  DTaP/Tdap/Td (3 - Td or Tdap) 06/17/2030   Pneumonia Vaccine 45+ Years old  Completed   Zoster Vaccines- Shingrix  Completed   HPV VACCINES  Aged Out   Meningococcal B Vaccine  Aged Out    Health Maintenance  Health Maintenance Due  Topic Date Due   OPHTHALMOLOGY EXAM  06/24/2020   COVID-19 Vaccine (4 - 2024-25 season) 10/24/2022   Health Maintenance Items Addressed: HAS AN EYE EXAM AT END OF MONTH- UP TO DATE ON SHOTS EXCEPT PNA  Additional Screening:  Vision Screening: Recommended annual ophthalmology exams for early detection of glaucoma and other disorders of the eye.  Dental Screening: Recommended annual dental exams for proper oral hygiene  Community Resource Referral / Chronic Care Management: CRR required  this visit?  No   CCM required this visit?  No     Plan:     I have personally reviewed and noted the following in the patient's chart:   Medical and social history Use of alcohol, tobacco or illicit drugs  Current medications and supplements including opioid prescriptions. Patient is not currently taking opioid prescriptions. Functional ability and status Nutritional status Physical activity Advanced directives List of other physicians Hospitalizations, surgeries, and ER visits in previous 12 months Vitals Screenings to include cognitive, depression, and falls Referrals and appointments  In addition, I have reviewed and discussed with patient certain preventive protocols, quality metrics, and best practice recommendations. A written personalized care plan for preventive services as well as general preventive health recommendations were provided to patient.     Hal Hope, LPN   10/02/9145   After Visit Summary: (MyChart) Due to this being a telephonic visit, the after visit summary with patients personalized plan was offered to patient via MyChart   Notes: Nothing significant to report at this time.

## 2023-06-02 NOTE — Patient Instructions (Addendum)
 [name] , Thank you for taking time to come for your Medicare Wellness Visit. I appreciate your ongoing commitment to your health goals. Please review the following plan we discussed and let me know if I can assist you in the future.   Referrals/Orders/Follow-Ups/Clinician Recommendations: NONE  This is a list of the screening recommended for you and due dates:  Health Maintenance  Topic Date Due   Eye exam for diabetics  06/24/2020   COVID-19 Vaccine (4 - 2024-25 season) 10/24/2022   Yearly kidney function blood test for diabetes  08/02/2023   Yearly kidney health urinalysis for diabetes  08/02/2023   Complete foot exam   08/30/2023   Flu Shot  09/23/2023   Hemoglobin A1C  11/16/2023   Medicare Annual Wellness Visit  06/01/2024   DTaP/Tdap/Td vaccine (3 - Td or Tdap) 06/17/2030   Pneumonia Vaccine  Completed   Zoster (Shingles) Vaccine  Completed   HPV Vaccine  Aged Out   Meningitis B Vaccine  Aged Out    Advanced directives: (ACP Link)Information on Advanced Care Planning can be found at [facility] Advance Health Care Directives Advance Health Care Directives. [email or web address]   Next Medicare Annual Wellness Visit scheduled for next year: Yes   06/07/24 @ 8:50 AM BY PHONE

## 2023-06-06 ENCOUNTER — Telehealth: Payer: Self-pay | Admitting: Family Medicine

## 2023-06-06 NOTE — Telephone Encounter (Signed)
Have not received any paperwork yet

## 2023-06-06 NOTE — Telephone Encounter (Signed)
 Copied from CRM [id number]. Topic: Medical Record Request - Other >> Jun 06, 2023 11:58 AM Rosaria Common wrote: Reason for CRM: Gregory Leash from [facility] is inquiring if a UTI Testing fax was received today. His call back number is [phone] and fax needs to be dated and signed by provider.

## 2023-06-23 ENCOUNTER — Other Ambulatory Visit: Payer: Self-pay | Admitting: Family Medicine

## 2023-06-23 DIAGNOSIS — N1831 Chronic kidney disease, stage 3a: Secondary | ICD-10-CM

## 2023-07-20 ENCOUNTER — Other Ambulatory Visit: Payer: Self-pay | Admitting: Family Medicine

## 2023-07-20 DIAGNOSIS — E669 Obesity, unspecified: Secondary | ICD-10-CM

## 2023-07-21 ENCOUNTER — Other Ambulatory Visit: Payer: Self-pay | Admitting: Family Medicine

## 2023-07-21 DIAGNOSIS — Z8711 Personal history of peptic ulcer disease: Secondary | ICD-10-CM

## 2023-07-28 ENCOUNTER — Telehealth: Payer: Self-pay

## 2023-07-28 NOTE — Telephone Encounter (Signed)
 Received a request from [employer] requesting a new prescription to be fax to [facility] [phone]

## 2023-08-05 ENCOUNTER — Other Ambulatory Visit: Payer: Self-pay | Admitting: Family Medicine

## 2023-08-05 DIAGNOSIS — E669 Obesity, unspecified: Secondary | ICD-10-CM

## 2023-08-05 DIAGNOSIS — Z8711 Personal history of peptic ulcer disease: Secondary | ICD-10-CM

## 2023-08-10 ENCOUNTER — Other Ambulatory Visit: Payer: Medicare Other | Admitting: Pharmacist

## 2023-08-10 DIAGNOSIS — E1169 Type 2 diabetes mellitus with other specified complication: Secondary | ICD-10-CM

## 2023-08-10 DIAGNOSIS — I152 Hypertension secondary to endocrine disorders: Secondary | ICD-10-CM

## 2023-08-10 NOTE — Progress Notes (Signed)
 08/10/2023 Name: Drew [name] MRN: [id number] DOB: 06-11-1940  Chief Complaint  Patient presents with   Medication Management    Drew [name] is a 83 y.o. year old male who presented for a telephone visit.   They were referred to the pharmacist for assistance in managing medication access.      Subjective:   Care Team: Primary Care Provider: Sowles, Krichna, MD ; Next Scheduled Visit: 09/15/2023 Cardiologist: Sammy Crisp, MD Nephrologist: Rica Chalet, MD; Next Scheduled Visit: 02/02/2024 Pulmonologist: Cleve Dale, MD; Next Scheduled Visit: 10/07/2023  Medication Access/Adherence  Current Pharmacy:  [facility] - [place], [place] - [address] [facility] [address] [place] [address] Phone: [phone] Fax: [phone]  [facility] [address], [place] - [address] [facility] [place] [address] Phone: [phone] Fax: [phone]  [facility] - [place], [place] - [address] [place] [address] Phone: [phone] Fax: [phone]   Patient reports affordability concerns with their medications: No  Patient reports access/transportation concerns to their pharmacy: No  Patient reports adherence concerns with their medications:  No     Reports takes medications from weekly pillbox as filled by his wife. Reports wife administers his daily Victoza  injection    Diabetes:   Current medications:  - Victoza  1.8 mg injection daily Find that spouse has recently been giving Victoza  1.2 daily dose recently (not sure for how long)   Previous therapies tried: Farxiga  (itching)    Current glucose readings: before breakfast mostly ranging 120-135   Statin therapy: rosuvastatin  20 mg daily  Current physical activity: reports walks his dog ~5-10 minutes daily. Reports working on gradually walking longer   Current medication access  support: Enrolled in patient assistance for Ozempic from [employer] through 02/22/2024 - Patient states still has >2 month supply of Victoza  remaining and plans to use this up before starting Ozempic 1 mg WEEKLY, as prescribed by PCP     Hypertension/Heart Failure:   Current medications:  ACEi/ARB/ARNI: losartan  25 mg - 1/2 tablet (12.5 mg) daily Beta blocker: carvedilol  3.125 mg twice daily Amlodipine  2.5 mg - 1 tablet daily   Patient has an automated, upper arm home BP cuff Denies checking home BP recently   Patient denies hypotensive s/sx including dizziness, lightheadedness as long as takes positional changes slowly    Current physical activity: reports walks his dog ~5-10 minutes daily. Reports working on gradually walking longer   Objective:  Lab Results  Component Value Date   HGBA1C 6.1 (A) 05/16/2023    Lab Results  Component Value Date   CREATININE 1.36 (H) 08/02/2022   BUN 19 08/02/2022   NA 139 08/02/2022   K 4.1 08/02/2022   CL 106 08/02/2022   CO2 22 08/02/2022    Lab Results  Component Value Date   CHOL 98 08/30/2022   HDL 30 (L) 08/30/2022   LDLCALC 28 08/30/2022   TRIG 379 (H) 08/30/2022   CHOLHDL 3.3 08/30/2022   BP Readings from Last 3 Encounters:  05/16/23 118/72  01/05/23 122/60  01/04/23 124/76   Pulse Readings from Last 3 Encounters:  05/16/23 69  01/05/23 (!) 57  01/04/23 67     Current Outpatient Medications on File Prior to Visit  Medication Sig Dispense Refill   amLODipine  (NORVASC ) 2.5 MG tablet TAKE 1 TABLET BY MOUTH DAILY 90 tablet 0   carvedilol  (COREG )  3.125 MG tablet TAKE 1 TABLET BY MOUTH TWICE  DAILY 180 tablet 3   liraglutide  (VICTOZA ) 18 MG/3ML SOPN Inject 1.8 mg into the skin daily. 9 mL 3   losartan  (COZAAR ) 25 MG tablet TAKE ONE-HALF TABLET BY MOUTH  DAILY 45 tablet 6   acetaminophen (TYLENOL) 500 MG tablet Take 500 mg by mouth every 6 (six) hours as needed.     albuterol (VENTOLIN HFA) 108 (90 Base) MCG/ACT  inhaler SMARTSIG:2 Puff(s) By Mouth Every 4 Hours PRN     allopurinol  (ZYLOPRIM ) 100 MG tablet Take 100 mg by mouth 2 (two) times daily.     aspirin  EC 81 MG tablet Take 81 mg by mouth daily.     Cholecalciferol (VITAMIN D3) 25 MCG (1000 UT) CAPS Take 1,000 Units by mouth daily.     Coenzyme Q10 (CO Q 10) 100 MG CAPS Take 100 mg by mouth daily.     famotidine  (PEPCID ) 20 MG tablet TAKE 1 TABLET BY MOUTH DAILY 90 tablet 0   Insulin  Pen Needle (BD PEN NEEDLE NANO U/F) 32G X 4 MM MISC USE SUBCUTANEOUSLY ONCE DAILY AT 12 NOON 100 each 2   levothyroxine  (SYNTHROID ) 25 MCG tablet TAKE 1 TABLET BY MOUTH IN THE  MORNING 100 tablet 2   Multiple Vitamins-Minerals (CENTRUM SILVER PO) Take 1 tablet by mouth daily.     nitroGLYCERIN  (NITROSTAT ) 0.4 MG SL tablet DISSOLVE 1 TABLET UNDER THE  TONGUE EVERY 5 MINUTES AS NEEDED FOR CHEST PAIN. MAX OF 3 TABLETS IN 15 MINUTES. CALL 911 IF PAIN  PERSISTS. 50 tablet 7   Omega-3 Fatty Acids (FISH OIL) 1000 MG CAPS Take 1,000 mg by mouth daily.     rosuvastatin  (CRESTOR ) 20 MG tablet Take 1 tablet (20 mg total) by mouth daily. 100 tablet 1   traZODone  (DESYREL ) 50 MG tablet Take 0.5-1 tablets (25-50 mg total) by mouth at bedtime as needed. for sleep 90 tablet 3   triamcinolone  cream (KENALOG ) 0.1 % Apply 1 application topically 2 (two) times daily. 30 g 0   No current facility-administered medications on file prior to visit.        Assessment/Plan:   Counsel patient/caregiver to give Victoza  1.8 mg daily as directed until uses up supply of Victoza . Then plans to start Ozempic 1 mg WEEKLY, as prescribed by PCP  Both patient and spouse verbalize understanding of plan  Diabetes: - Currently controlled - Reviewed long term cardiovascular and renal outcomes of uncontrolled blood sugar - Reviewed goal A1c, goal fasting, and goal 2 hour post prandial glucose - Reviewed dietary modifications including importance of having regular well-balanced meals and snacks  throughout the day, while controlling carbohydrate portion sizes  Encourage patient to limit portion sizes of potatoes - Recommend to check glucose, keep log of results and have this record to review at upcoming medical appointments. Patient to contact provider office sooner if needed for readings outside of established parameters or symptoms     Hypertension/Heart Failure: - Reviewed long term cardiovascular and renal outcomes of uncontrolled blood pressure - Recommend to monitor home blood pressure, keep log of results and have this record to review at upcoming medical appointments. Patient to contact provider office sooner if needed for readings outside of established parameters or symptoms       Follow Up Plan: Clinical Pharmacist will follow up with patient by telephone on 10/12/2023 at 10:00 AM    Arthur Lash, PharmD, [facility] [phone]

## 2023-08-10 NOTE — Patient Instructions (Signed)
 Goals Addressed             This Visit's Progress    Pharmacy Goals       The goal A1c is less than 7%. This is the best way to reduce the risk of the long term complications of diabetes, including heart disease, kidney disease, eye disease, strokes, and nerve damage. An A1c of less than 7% corresponds with fasting sugars less than 130 and 2 hour after meal sugars less than 180.   If you need to reach out to patient assistance programs regarding refills or to find out the status of your application, you can do so by calling:  [employer] at [phone]  Thank you!  Estelle Grumbles, PharmD, [facility] [phone]

## 2023-08-22 ENCOUNTER — Other Ambulatory Visit: Payer: Self-pay | Admitting: Family Medicine

## 2023-08-22 DIAGNOSIS — E034 Atrophy of thyroid (acquired): Secondary | ICD-10-CM

## 2023-09-15 ENCOUNTER — Ambulatory Visit: Admitting: Family Medicine

## 2023-09-15 ENCOUNTER — Encounter: Payer: Self-pay | Admitting: Family Medicine

## 2023-09-15 VITALS — BP 122/72 | HR 61 | Resp 16 | Ht 68.0 in | Wt 221.0 lb

## 2023-09-15 DIAGNOSIS — I5032 Chronic diastolic (congestive) heart failure: Secondary | ICD-10-CM

## 2023-09-15 DIAGNOSIS — I700 Atherosclerosis of aorta: Secondary | ICD-10-CM | POA: Diagnosis not present

## 2023-09-15 DIAGNOSIS — N1831 Chronic kidney disease, stage 3a: Secondary | ICD-10-CM | POA: Diagnosis not present

## 2023-09-15 DIAGNOSIS — Z8711 Personal history of peptic ulcer disease: Secondary | ICD-10-CM | POA: Diagnosis not present

## 2023-09-15 DIAGNOSIS — E1122 Type 2 diabetes mellitus with diabetic chronic kidney disease: Secondary | ICD-10-CM

## 2023-09-15 DIAGNOSIS — E034 Atrophy of thyroid (acquired): Secondary | ICD-10-CM

## 2023-09-15 DIAGNOSIS — R5383 Other fatigue: Secondary | ICD-10-CM | POA: Diagnosis not present

## 2023-09-15 DIAGNOSIS — E669 Obesity, unspecified: Secondary | ICD-10-CM

## 2023-09-15 DIAGNOSIS — E1169 Type 2 diabetes mellitus with other specified complication: Secondary | ICD-10-CM

## 2023-09-15 DIAGNOSIS — I2089 Other forms of angina pectoris: Secondary | ICD-10-CM | POA: Diagnosis not present

## 2023-09-15 DIAGNOSIS — E1159 Type 2 diabetes mellitus with other circulatory complications: Secondary | ICD-10-CM | POA: Diagnosis not present

## 2023-09-15 DIAGNOSIS — D696 Thrombocytopenia, unspecified: Secondary | ICD-10-CM | POA: Diagnosis not present

## 2023-09-15 DIAGNOSIS — G4733 Obstructive sleep apnea (adult) (pediatric): Secondary | ICD-10-CM

## 2023-09-15 LAB — POCT GLYCOSYLATED HEMOGLOBIN (HGB A1C): Hemoglobin A1C: 5.8 % — AB (ref 4.0–5.6)

## 2023-09-15 MED ORDER — LEVOTHYROXINE SODIUM 25 MCG PO TABS: 25.0000 ug | ORAL_TABLET | Freq: Every morning | ORAL | 0 refills | Status: DC

## 2023-09-15 MED ORDER — FAMOTIDINE 20 MG PO TABS: 20.0000 mg | ORAL_TABLET | Freq: Every day | ORAL | 1 refills | Status: AC

## 2023-09-15 MED ORDER — ROSUVASTATIN CALCIUM 20 MG PO TABS: 20.0000 mg | ORAL_TABLET | Freq: Every day | ORAL | 1 refills | Status: AC

## 2023-09-15 MED ORDER — AMLODIPINE BESYLATE 2.5 MG PO TABS: 2.5000 mg | ORAL_TABLET | Freq: Every day | ORAL | 1 refills | Status: AC

## 2023-09-15 MED ORDER — QUETIAPINE FUMARATE 25 MG PO TABS
25.0000 mg | ORAL_TABLET | Freq: Every day | ORAL | 1 refills | Status: AC
Start: 2023-09-15 — End: ?

## 2023-09-15 NOTE — Progress Notes (Signed)
 Name: Drew [name]   MRN: [id number]    DOB: [birth year]   Date:09/15/2023       Progress Note  Subjective  Chief Complaint  Chief Complaint  Patient presents with   Medical Management of Chronic Issues   Discussed the use of AI scribe software for clinical note transcription with the patient, who gave verbal consent to proceed.  History of Present Illness COBI DELPH is an 83 year old male with diabetes, chronic kidney disease, and thrombocytopenia who presents for a follow-up visit.  He experienced epistaxis at 3:00 AM, which he attributes to a dry CPAP machine. The epistaxis was brief, lasting only a few minutes.  He has chronic kidney disease and recently had blood work done by his nephrologist. His kidney function was slightly low, but uric acid and protein in the urine were normal. He does not have anemia, but his red blood cells are large, possibly due to folic acid or A87 deficiency.  His diabetes is well-controlled with an A1c of 5.8. He uses Victoza  1.8 mg daily, which he receives through an assistance program. He checks his blood sugar twice a week. Despite medication, he experiences hunger and is adjusting his diet to include more protein.  He has hypertension, managed with amlodipine  and losartan . He also takes carvedilol  prescribed by his cardiologist.  He experiences sleep disturbances, waking up around 2:30 or 3:00 AM. He currently takes trazodone  for sleep but reports it is not effective.  He has a history of stable angina and aortic atherosclerosis, for which he takes nitroglycerin  as needed, though he has not used it recently. He also takes rosuvastatin  for cholesterol management.  He has sleep apnea and uses a CPAP machine regularly. He ensures the humidifier is filled to prevent dryness.  He has chronic heart failure with preserved ejection fraction. He sometimes experiences leg swelling, which he manages by elevating his legs but denies wheezing or orthopnea.  He  has a history of gastroenteritis and gastric ulcer but reports no recent stomach pain.  He has hypothyroidism and takes thyroid  medication regularly. He takes it early in the morning, separate from other medications.  He has a fungal infection on one toenail, which does not cause pain. He has not seen a podiatrist for this issue.    Patient Active Problem List   Diagnosis Date Noted   Controlled gout 10/19/2021   Diabetic polyneuropathy associated with type 2 diabetes mellitus (HCC) 10/19/2021   Insomnia 05/12/2021   Coronary artery disease of native artery of native heart with stable angina pectoris (HCC) 08/14/2020   Aortic atherosclerosis (HCC) 08/14/2020   Atrophy of kidney 10/10/2019   Edema of lower extremity 10/10/2019   Thrombocytopenia (HCC) 08/13/2019   Type 2 diabetes mellitus with obesity (HCC) 08/13/2019   Hyperlipidemia associated with type 2 diabetes mellitus (HCC) 08/13/2019   Neuropathy involving both lower extremities 05/04/2019   Polyneuropathy, unspecified 05/04/2019   Multiple renal cysts 04/09/2019   Stable angina (HCC) 12/29/2018   Onychomycosis of multiple toenails with type 2 diabetes mellitus (HCC) 11/30/2017   Chronic heart failure with preserved ejection fraction (HCC) 10/20/2017   OSA on CPAP 02/10/2017   Abnormal ankle brachial index (ABI) 11/26/2015   Stage 3a chronic kidney disease (HCC) 08/15/2015   H/O: upper GI bleed    Hypothyroidism due to acquired atrophy of thyroid  03/11/2015   History of prostatitis 11/06/2014   Hypertension associated with type 2 diabetes mellitus (HCC) 11/06/2014   Mixed hyperlipidemia 11/06/2014  Flat feet, bilateral 11/06/2014   Chronic multiple gastric erosions 11/06/2014   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) 11/06/2014    Past Surgical History:  Procedure Laterality Date   CATARACT EXTRACTION, BILATERAL Bilateral 11/18/2017   COLONOSCOPY N/A 12/27/2013   Procedure: COLONOSCOPY;   Surgeon: Norleen JAYSON Hint, MD;  Location: [facility] ENDOSCOPY;  Service: Endoscopy;  Laterality: N/A;   ENTEROSCOPY N/A 12/21/2013   Procedure: ENTEROSCOPY;  Surgeon: Lynwood LITTIE Celestia Mickey., MD;  Location: [facility] ENDOSCOPY;  Service: Endoscopy;  Laterality: N/A;   ENTEROSCOPY N/A 12/27/2013   Procedure: ENTEROSCOPY;  Surgeon: Norleen JAYSON Hint, MD;  Location: [facility] ENDOSCOPY;  Service: Endoscopy;  Laterality: N/A;    Family History  Problem Relation Age of Onset   Hyperlipidemia Father    Kidney disease Father    Heart disease Father    Congestive Heart Failure Mother    Heart disease Mother    Hyperlipidemia Daughter    Hyperlipidemia Son    Diabetes Son    Anxiety disorder Daughter    Depression Daughter    Arthritis Daughter     Social History   Tobacco Use   Smoking status: Former    Current packs/day: 0.00    Types: Cigarettes, Cigars    Start date: 1989    Quit date: 1990    Years since quitting: 35.5   Smokeless tobacco: Never   Tobacco comments:    Smoked afternoon cigar  Substance Use Topics   Alcohol use: No     Current Outpatient Medications:    acetaminophen (TYLENOL) 500 MG tablet, Take 500 mg by mouth every 6 (six) hours as needed., Disp: , Rfl:    albuterol (VENTOLIN HFA) 108 (90 Base) MCG/ACT inhaler, SMARTSIG:2 Puff(s) By Mouth Every 4 Hours PRN, Disp: , Rfl:    allopurinol  (ZYLOPRIM ) 100 MG tablet, Take 100 mg by mouth 2 (two) times daily., Disp: , Rfl:    amLODipine  (NORVASC ) 2.5 MG tablet, TAKE 1 TABLET BY MOUTH DAILY, Disp: 90 tablet, Rfl: 0   aspirin  EC 81 MG tablet, Take 81 mg by mouth daily., Disp: , Rfl:    carvedilol  (COREG ) 3.125 MG tablet, TAKE 1 TABLET BY MOUTH TWICE  DAILY, Disp: 180 tablet, Rfl: 3   Cholecalciferol (VITAMIN D3) 25 MCG (1000 UT) CAPS, Take 1,000 Units by mouth daily., Disp: , Rfl:    Coenzyme Q10 (CO Q 10) 100 MG CAPS, Take 100 mg by mouth daily., Disp: , Rfl:    famotidine  (PEPCID ) 20 MG tablet, TAKE 1 TABLET BY MOUTH DAILY, Disp: 90 tablet, Rfl:  0   Insulin  Pen Needle (BD PEN NEEDLE NANO U/F) 32G X 4 MM MISC, USE SUBCUTANEOUSLY ONCE DAILY AT 12 NOON, Disp: 100 each, Rfl: 2   levothyroxine  (SYNTHROID ) 25 MCG tablet, TAKE 1 TABLET BY MOUTH IN THE  MORNING, Disp: 100 tablet, Rfl: 0   liraglutide  (VICTOZA ) 18 MG/3ML SOPN, Inject 1.8 mg into the skin daily., Disp: 9 mL, Rfl: 3   losartan  (COZAAR ) 25 MG tablet, TAKE ONE-HALF TABLET BY MOUTH  DAILY, Disp: 45 tablet, Rfl: 6   Multiple Vitamins-Minerals (CENTRUM SILVER PO), Take 1 tablet by mouth daily., Disp: , Rfl:    nitroGLYCERIN  (NITROSTAT ) 0.4 MG SL tablet, DISSOLVE 1 TABLET UNDER THE  TONGUE EVERY 5 MINUTES AS NEEDED FOR CHEST PAIN. MAX OF 3 TABLETS IN 15 MINUTES. CALL 911 IF PAIN  PERSISTS., Disp: 50 tablet, Rfl: 7   Omega-3 Fatty Acids (FISH OIL) 1000 MG CAPS, Take  1,000 mg by mouth daily., Disp: , Rfl:    rosuvastatin  (CRESTOR ) 20 MG tablet, Take 1 tablet (20 mg total) by mouth daily., Disp: 100 tablet, Rfl: 1   traZODone  (DESYREL ) 50 MG tablet, Take 0.5-1 tablets (25-50 mg total) by mouth at bedtime as needed. for sleep, Disp: 90 tablet, Rfl: 3   triamcinolone  cream (KENALOG ) 0.1 %, Apply 1 application topically 2 (two) times daily., Disp: 30 g, Rfl: 0  Allergies  Allergen Reactions   Farxiga  [Dapagliflozin ] Itching   Niacin  And Related Itching and Rash    I personally reviewed active problem list, medication list, allergies with the patient/caregiver today.   ROS  Ten systems reviewed and is negative except as mentioned in HPI    Objective Physical Exam  CONSTITUTIONAL: Patient appears well-developed and well-nourished. No distress. HEENT: Head atraumatic, normocephalic, neck supple. Nose normal. CARDIOVASCULAR: Normal rate, regular rhythm and normal heart sounds. No murmur heard. Trace  BLE edema. Fungal infection on toenail. PULMONARY: Effort normal and breath sounds normal. No respiratory distress. PSYCHIATRIC: Patient has a normal mood and affect. Behavior is normal.  Judgment and thought content normal.   Vitals:   09/15/23 1059  BP: 122/72  Pulse: 61  Resp: 16  SpO2: 98%  Weight: 221 lb (100.2 kg)  Height: 5' 8 (1.727 m)    Body mass index is 33.6 kg/m.  Recent Results (from the past 2160 hours)  POCT HgB A1C     Status: Abnormal   Collection Time: 09/15/23 11:00 AM  Result Value Ref Range   Hemoglobin A1C 5.8 (A) 4.0 - 5.6 %   HbA1c POC (<> result, manual entry)     HbA1c, POC (prediabetic range)     HbA1c, POC (controlled diabetic range)      Diabetic Foot Exam:  Diabetic foot exam was performed with the following findings:   No data filed      PHQ2/9:    06/02/2023    8:18 AM 05/16/2023   10:08 AM 01/04/2023   10:43 AM 11/03/2022   11:58 AM 08/30/2022   10:42 AM  Depression screen PHQ 2/9  Decreased Interest 0 0 0 0 0  Down, Depressed, Hopeless 0 0 0 0 0  PHQ - 2 Score 0 0 0 0 0  Altered sleeping 0 0 0  0  Tired, decreased energy 0 0 0  0  Change in appetite 0 0 0  0  Feeling bad or failure about yourself  0 0 0  0  Trouble concentrating 0 0 0  0  Moving slowly or fidgety/restless 0 0 0  0  Suicidal thoughts 0 0 0  0  PHQ-9 Score 0 0 0  0  Difficult doing work/chores Not difficult at all Not difficult at all       phq 9 is negative  Fall Risk:    06/02/2023    8:23 AM 05/16/2023    9:58 AM 01/04/2023   10:43 AM 08/30/2022   10:41 AM 05/27/2022   10:36 AM  Fall Risk   Falls in the past year? 1 0 1 0 1  Comment     fell in yard in [place] no injuries  Number falls in past yr: 0 0 0  0  Injury with Fall? 0 0 0  0  Risk for fall due to :  No Fall Risks History of fall(s) No Fall Risks No Fall Risks  Follow up Falls prevention discussed;Falls evaluation completed Falls prevention discussed;Education provided;Falls evaluation completed  Falls prevention discussed;Education provided;Falls evaluation completed Falls prevention discussed Education provided;Falls prevention discussed      Assessment & Plan Type 2  diabetes mellitus with stage 3A chronic kidney disease Well-controlled with A1c of 5.8%. - Continue Victoza  1.8 mg daily. - Continue losartan  for kidney protection. - Advise dietary modifications to include more protein and fiber. - Check blood glucose twice a week at different times of the day.  Hypertension Well-controlled with current medication regimen. - Continue amlodipine . - Continue carvedilol  as prescribed by cardiologist. - Continue losartan  as prescribed by cardiologist.  Stable angina and aortic atherosclerosis Managed with current medications. No recent chest pain requiring nitroglycerin . - Continue rosuvastatin  for cholesterol management. - Continue nitroglycerin  as needed for chest pain.  Chronic heart failure with preserved ejection fraction No current symptoms of shortness of breath or significant leg swelling. - Continue current medications as prescribed by cardiologist. - Advise elevating legs when swelling occurs. - Monitor for signs of fluid retention and report if symptoms worsen.  Morbid obesity  Present with BMI over 35 with co-morbidities such as diabetes and OSA. - Advise dietary modifications to include more fiber and protein. - Encourage weight loss through dietary changes.  Thrombocytopenia Recent platelet levels have improved. - Monitor platelet levels regularly.  Obstructive sleep apnea Managed with CPAP. Recent epistaxis likely due to dry air from CPAP machine. - Ensure CPAP humidifier is filled nightly. - Consider contacting CPAP supplier if water usage seems excessive.  Hypothyroidism Managed with current medication. Annual thyroid  function test is due. - Check thyroid  function annually. - Continue current thyroid  medication regimen.  Insomnia Not well-controlled with current trazodone  regimen. - Discontinue trazodone . - Start quetiapine  (Seroquel ) for sleep, 30 minutes before bedtime.  Onychomycosis of toenail Present in the big  toenail. No pain reported. - Consider referral to podiatrist for toenail removal if symptomatic.

## 2023-09-16 LAB — HEPATIC FUNCTION PANEL
AG Ratio: 2.1 (calc) (ref 1.0–2.5)
ALT: 49 U/L — ABNORMAL HIGH (ref 9–46)
AST: 39 U/L — ABNORMAL HIGH (ref 10–35)
Albumin: 4.4 g/dL (ref 3.6–5.1)
Alkaline phosphatase (APISO): 69 U/L (ref 35–144)
Bilirubin, Direct: 0.2 mg/dL (ref 0.0–0.2)
Globulin: 2.1 g/dL (ref 1.9–3.7)
Indirect Bilirubin: 0.6 mg/dL (ref 0.2–1.2)
Total Bilirubin: 0.8 mg/dL (ref 0.2–1.2)
Total Protein: 6.5 g/dL (ref 6.1–8.1)

## 2023-09-16 LAB — B12 AND FOLATE PANEL
Folate: 17.5 ng/mL
Vitamin B-12: 591 pg/mL (ref 200–1100)

## 2023-09-16 LAB — LIPID PANEL
Cholesterol: 119 mg/dL (ref <200)
HDL: 38 mg/dL — ABNORMAL LOW (ref >=40)
LDL Cholesterol (Calc): 55 mg/dL
Non-HDL Cholesterol (Calc): 81 mg/dL (ref <130)
Total CHOL/HDL Ratio: 3.1 (calc) (ref <5.0)
Triglycerides: 191 mg/dL — ABNORMAL HIGH (ref <150)

## 2023-09-16 LAB — TSH: TSH: 3.09 m[IU]/L (ref 0.40–4.50)

## 2023-09-20 ENCOUNTER — Ambulatory Visit: Payer: Self-pay | Admitting: Family Medicine

## 2023-09-22 ENCOUNTER — Other Ambulatory Visit: Payer: Self-pay | Admitting: Emergency Medicine

## 2023-09-22 MED ORDER — IGLUCOSE TEST STRIPS VI STRP: ORAL_STRIP | 1 refills | Status: AC

## 2023-10-07 ENCOUNTER — Ambulatory Visit: Admitting: Internal Medicine

## 2023-10-07 ENCOUNTER — Encounter: Payer: Self-pay | Admitting: Internal Medicine

## 2023-10-07 VITALS — BP 120/80 | HR 62 | Temp 97.5°F | Ht 68.0 in | Wt 221.0 lb

## 2023-10-07 DIAGNOSIS — G4733 Obstructive sleep apnea (adult) (pediatric): Secondary | ICD-10-CM

## 2023-10-07 NOTE — Progress Notes (Signed)
 [facility] Pulmonary Medicine Consultation     Requesting MD/Service: Lonni Hanson, MD    PT PROFILE: 83 y.o. male former smoker referred for evaluation of fatigue and daytime sleepiness  DATA: 03/17/16 echocardiogram: Grade 1 diastolic dysfunction. Otherwise normal. 11/11/16 PSG: AHI 29.5/hr. AutoSet 5-20 cm H2O recommended 01/06-02/04/20 CPAP compliance: Usage 30/30.  >4 hours: 30/30.  Median pressure 6.7 cm H2O.  Mean AHI 1.3/hour. 02/2020 compliance DL-->Compliance report reviewed with patient in detail 100% compliance for days and >4 hrs AHI reduced to 0.7 Cpap 5-20  CC Follow-up assessment of OSA   HPI No exacerbation at this time No evidence of heart failure at this time No evidence or signs of infection at this time No respiratory distress No fevers, chills, nausea, vomiting, diarrhea No evidence of lower extremity edema No evidence hemoptysis   Discussed sleep data and reviewed with patient.  Encouraged proper weight management.  Discussed driving precautions and its relationship with hypersomnolence.  Discussed sleep hygiene, and benefits of a fixed sleep waked time.  The importance of getting eight or more hours of sleep discussed with patient.  Discussed limiting the use of the computer and television before bedtime.  Decrease naps during the day, so night time sleep will become enhanced.  Limit caffeine, and sleep deprivation.   Patient uses and benefits from therapy Using CPAP nightly and with naps Pressure setting is comfortable and is sleeping well.  Compliance report reviewed with patient in detail 100% compliance for days and >4 hrs AHI reduced to 0.5 Cpap 5-15    BP 120/80 (BP Location: Right Arm, Patient Position: Sitting, Cuff Size: Large)   Pulse 62   Temp (!) 97.5 F (36.4 C) (Oral)   Ht 5' 8 (1.727 m)   Wt 221 lb (100.2 kg)   SpO2 93%   BMI 33.60 kg/m       Review of Systems: Gen:  Denies  fever, sweats, chills weight  loss  HEENT: Denies blurred vision, double vision, ear pain, eye pain, hearing loss, nose bleeds, sore throat Cardiac:  No dizziness, chest pain or heaviness, chest tightness,edema, No JVD Resp:   No cough, -sputum production, -shortness of breath,-wheezing, -hemoptysis,  Other:  All other systems negative   Physical Examination:   General Appearance: No distress  EYES PERRLA, EOM intact.   NECK Supple, No JVD Pulmonary: normal breath sounds, No wheezing.  CardiovascularNormal S1,S2.  No m/r/g.   Abdomen: Benign, Soft, non-tender. Neurology UE/LE 5/5 strength, no focal deficits Ext pulses intact, cap refill intact ALL OTHER ROS ARE NEGATIVE      DATA:      Latest Ref Rng & Units 08/02/2022   10:41 AM 07/07/2021   10:10 AM 06/18/2021   10:52 AM  BMP  Glucose 70 - 99 mg/dL 95  839  893   BUN 8 - 23 mg/dL 19  17  15    Creatinine 0.61 - 1.24 mg/dL 8.63  8.79  8.74   BUN/Creat Ratio 6 - 22 (calc)   12   Sodium 135 - 145 mmol/L 139  140  142   Potassium 3.5 - 5.1 mmol/L 4.1  4.1  4.5   Chloride 98 - 111 mmol/L 106  109  108   CO2 22 - 32 mmol/L 22  22  25    Calcium  8.9 - 10.3 mg/dL 9.6  9.9  9.9        Latest Ref Rng & Units 08/02/2022   10:41 AM 06/18/2021   10:52 AM 11/20/2020  12:12 PM  CBC  WBC 4.0 - 10.5 K/uL 6.7  7.2  6.7   Hemoglobin 13.0 - 17.0 g/dL 85.3  84.9  85.6   Hematocrit 39.0 - 52.0 % 43.6  44.5  40.8   Platelets 150 - 400 K/uL 143  152  161     CXR:  No recent film   Assessment and plan 83 year old pleasant white male seen today for follow-up assessment of OSA  Assessment of OSA Previous AHI 30 Continue CPAP as prescribed  Excellent compliance report Reviewed compliance report in detail with patient Patient definitely benefits the use of CPAP therapy as prescribed Using CPAP nightly and with naps Pressure setting is comfortable and is sleeping well. CPAP prescription 5-15 AHI reduced to 0.5  No evidence of acute heart failure at this  time No respiratory distress No fevers, chills, nausea, vomiting, diarrhea No evidence hemoptysis  Patient Instructions Continue to use CPAP every night, minimum of 4-6 hours a night.  Change equipment every 30 days or as directed by DME.  Wash your tubing with warm soap and water daily, hang to dry. Wash humidifier portion weekly. Use bottled, distilled water and change daily   Be aware of reduced alertness and do not drive or operate heavy machinery if experiencing this or drowsiness.  Exercise encouraged, as tolerated. Encouraged proper weight management.  Important to get eight or more hours of sleep  Limiting the use of the computer and television before bedtime.  Decrease naps during the day, so night time sleep will become enhanced.  Limit caffeine, and sleep deprivation.  HTN, stroke, uncontrolled diabetes and heart failure are potential risk factors.  Risk of untreated sleep apnea including cardiac arrhthymias, stroke, DM, pulm HTN.    Hypertension Sleep apnea can contribute to hypertension therefore treatment of sleep apnea is important part of hypertension management   MEDICATION ADJUSTMENTS/LABS AND TESTS ORDERED: DME REFERRAL FOR NEW CPAP MACHINE   CURRENT MEDICATIONS REVIEWED AT LENGTH WITH PATIENT TODAY   Patient  satisfied with Plan of action and management. All questions answered   Follow up 1 year   I spent a total of 42 minutes dedicated to the care of this patient on the date of this encounter to include pre-visit review of records, face-to-face time with the patient discussing conditions above, post visit ordering of testing, clinical documentation with the electronic health record, making appropriate referrals as documented, and communicating necessary information to the patient's healthcare team.    The Patient requires high complexity decision making for assessment and support, frequent evaluation and titration of therapies, application of advanced  monitoring technologies and extensive interpretation of multiple databases.  Patient satisfied with Plan of action and management. All questions answered    Nickolas Alm Cellar, M.D.  [facility]  Medical Director [facility] Medical Director [facility] Cardio-Pulmonary Department

## 2023-10-07 NOTE — Patient Instructions (Signed)

## 2023-10-12 ENCOUNTER — Other Ambulatory Visit: Admitting: Pharmacist

## 2023-10-12 ENCOUNTER — Telehealth: Payer: Self-pay | Admitting: Pharmacist

## 2023-10-12 NOTE — Progress Notes (Signed)
   Outreach Note  10/12/2023 Name: JOSEPH BIAS MRN: [id number] DOB: [birth year]  Referred by: Sowles, Krichna, MD  Was unable to reach patient via telephone today and unable to leave a message as no voicemail picks up  Follow Up Plan: Will collaborate with Care Guide to outreach to schedule follow up with me  Sharyle Sia, PharmD, [facility] [phone]

## 2023-10-15 ENCOUNTER — Other Ambulatory Visit: Payer: Self-pay | Admitting: Internal Medicine

## 2023-10-28 ENCOUNTER — Telehealth: Payer: Self-pay

## 2023-10-28 NOTE — Progress Notes (Signed)
 Complex Care Management Care Guide Note  10/28/2023 Name: Drew [name] MRN: [id number] DOB: [birth year]  Drew [name] is a 83 y.o. year old male who is a primary care patient of Sowles, Krichna, MD and is actively engaged with the care management team. I reached out to Drew [name] by phone today to assist with re-scheduling  with the Pharmacist.  Follow up plan: Unsuccessful telephone outreach attempt made. A HIPAA compliant phone message was left for the patient providing contact information and requesting a return call.  Jeoffrey Buffalo , RMA     [facility]  [facility], [facility] Guide  Direct Dial: [phone]  Website: [email or web address]

## 2023-11-14 NOTE — Progress Notes (Signed)
 Complex Care Management Care Guide Note  11/14/2023 Name: Drew [name] MRN: [id number] DOB: Oct 12, 1940  Drew [name] is a 83 y.o. year old male who is a primary care patient of Sowles, Krichna, MD and is actively engaged with the care management team. I reached out to Drew [name] by phone today to assist with re-scheduling  with the Pharmacist.  Follow up plan: Unsuccessful telephone outreach attempt made. A HIPAA compliant phone message was left for the patient providing contact information and requesting a return call.  Drew [name] , RMA     [facility]  [facility], [facility] Guide  Direct Dial: [phone]  Website: [email or web address]

## 2023-11-16 DIAGNOSIS — G4733 Obstructive sleep apnea (adult) (pediatric): Secondary | ICD-10-CM | POA: Diagnosis not present

## 2023-11-25 ENCOUNTER — Telehealth: Payer: Self-pay

## 2023-11-25 NOTE — Progress Notes (Signed)
 Attempted to contact patient regarding [employer] change for 2026 that they will no longer be offering PAP for Rybelsus or Ozempic for Medicare beneficiaries. Patient is currently taking Ozempic 1 mg weekly (switched from Victoza  when company stopped offering via PAP)  Unable to leave voicemail at this time. Will try again at a later date.  Raffaela Ladley E. Marsh, PharmD Clinical Pharmacist [facility] [phone]

## 2023-11-28 ENCOUNTER — Other Ambulatory Visit: Payer: Self-pay | Admitting: Internal Medicine

## 2023-11-28 LAB — OPHTHALMOLOGY REPORT-SCANNED

## 2023-11-28 NOTE — Progress Notes (Signed)
   11/28/2023  Drew [name]  DOB: 10/15/1940 MRN: [id number]  Attempted to contact patient for [employer] patient assistance. Voicemail is not set up at this time.   Dionisio Aragones E. Marsh, PharmD Clinical Pharmacist [facility] [phone]

## 2023-11-29 NOTE — Progress Notes (Signed)
 Complex Care Management Care Guide Note  11/29/2023 Name: Drew [name] MRN: [id number] DOB: 12/05/1940  Drew [name] is a 83 y.o. year old male who is a primary care patient of Sowles, Krichna, MD and is actively engaged with the care management team. I reached out to Drew [name] by phone today to assist with re-scheduling  with the Pharmacist.  Follow up plan: Unsuccessful telephone outreach attempt made. A HIPAA compliant phone message was left for the patient providing contact information and requesting a return call.  Jeoffrey Buffalo , RMA     [facility]  [facility], [facility] Guide  Direct Dial: [phone]  Website: [email or web address]

## 2023-12-05 ENCOUNTER — Other Ambulatory Visit: Payer: Self-pay | Admitting: Internal Medicine

## 2023-12-06 NOTE — Progress Notes (Signed)
   12/06/2023 Name: ZACKORY PUDLO MRN: [id number] DOB: 1940-09-10  Patient enrolled in Ozempic patient assistance program from [employer] through 02/22/2024   [employer] has announced that they will no longer offer Ozempic to [employer] beneficiaries through their Patient Assistance Program in 2026.    Third attempt at outreach.   Petronella Shuford E. Marsh, PharmD Clinical Pharmacist [facility] [phone]

## 2024-01-04 NOTE — Progress Notes (Signed)
 Drew [name]                                          MRN: [id number]   01/04/2024   The [facility] Quality Team Specialist reviewed this patient medical record for the purposes of chart review for care gap closure. The following were reviewed: abstraction for care gap closure-glycemic status assessment.    [facility]BCI Quality Team

## 2024-01-09 ENCOUNTER — Other Ambulatory Visit (HOSPITAL_COMMUNITY): Payer: Self-pay

## 2024-01-17 ENCOUNTER — Encounter: Payer: Self-pay | Admitting: Family Medicine

## 2024-01-17 ENCOUNTER — Ambulatory Visit: Admitting: Family Medicine

## 2024-01-17 VITALS — BP 124/74 | HR 71 | Temp 97.8°F | Resp 16 | Ht 68.0 in | Wt 218.8 lb

## 2024-01-17 DIAGNOSIS — M109 Gout, unspecified: Secondary | ICD-10-CM

## 2024-01-17 DIAGNOSIS — E119 Type 2 diabetes mellitus without complications: Secondary | ICD-10-CM

## 2024-01-17 DIAGNOSIS — N1831 Chronic kidney disease, stage 3a: Secondary | ICD-10-CM

## 2024-01-17 DIAGNOSIS — I2089 Other forms of angina pectoris: Secondary | ICD-10-CM

## 2024-01-17 DIAGNOSIS — E034 Atrophy of thyroid (acquired): Secondary | ICD-10-CM

## 2024-01-17 DIAGNOSIS — E1122 Type 2 diabetes mellitus with diabetic chronic kidney disease: Secondary | ICD-10-CM

## 2024-01-17 DIAGNOSIS — G4733 Obstructive sleep apnea (adult) (pediatric): Secondary | ICD-10-CM

## 2024-01-17 DIAGNOSIS — G4709 Other insomnia: Secondary | ICD-10-CM

## 2024-01-17 DIAGNOSIS — I129 Hypertensive chronic kidney disease with stage 1 through stage 4 chronic kidney disease, or unspecified chronic kidney disease: Secondary | ICD-10-CM

## 2024-01-17 DIAGNOSIS — Z23 Encounter for immunization: Secondary | ICD-10-CM

## 2024-01-17 DIAGNOSIS — E669 Obesity, unspecified: Secondary | ICD-10-CM

## 2024-01-17 DIAGNOSIS — E1142 Type 2 diabetes mellitus with diabetic polyneuropathy: Secondary | ICD-10-CM

## 2024-01-17 DIAGNOSIS — I110 Hypertensive heart disease with heart failure: Secondary | ICD-10-CM

## 2024-01-17 DIAGNOSIS — I10 Essential (primary) hypertension: Secondary | ICD-10-CM

## 2024-01-17 DIAGNOSIS — I5032 Chronic diastolic (congestive) heart failure: Secondary | ICD-10-CM

## 2024-01-17 LAB — POCT GLYCOSYLATED HEMOGLOBIN (HGB A1C): Hemoglobin A1C: 5.7 % — AB (ref 4.0–5.6)

## 2024-01-17 NOTE — Progress Notes (Signed)
 Name: Drew [name]   MRN: [id number]    DOB: Mar 25, 1940   Date:01/17/2024       Progress Note  Subjective  Chief Complaint  Chief Complaint  Patient presents with   Medical Management of Chronic Issues   Discussed the use of AI scribe software for clinical note transcription with the patient, who gave verbal consent to proceed.  History of Present Illness Drew [name] is an 83 year old male with type 2 diabetes who presents for routine follow-up.  His type 2 diabetes is well-controlled with daily Victoza , and he experiences no side effects. Blood sugar levels are generally stable, except for an unexplained increase on Mondays. He is awaiting a copy of his recent eye exam results.  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 The peripheral neuropathy has improved and is less bothersome. He takes rosuvastatin  and does not experience muscle pain. He takes losartan  (12.5 mg daily), carvedilol  (3.125 mg), and Norvasc  (2.5 mg) for hypertension.  He experiences chest pain with activity, requiring him to stop due to chest tightness, but does not need nitroglycerin . 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  He uses a CPAP machine for sleep apnea and reports doing well. He takes levothyroxine  (25 mcg daily) for thyroid  management without changes in bowel movements or swallowing difficulties.  He has a history of gastric ulcer and takes famotidine  (20 mg daily) for GERD, which effectively controls heartburn, burning, and indigestion.  He experiences sleep difficulties, sometimes having trouble sleeping. He takes Seroquel  (25 mg) and is attempting to reduce caffeine intake in the afternoon to improve sleep quality.  His liver enzymes are slightly elevated, but he is asymptomatic.    Patient Active Problem List    Diagnosis Date Noted   Controlled gout 10/19/2021   Diabetic polyneuropathy associated with type 2 diabetes mellitus (HCC) 10/19/2021   Insomnia 05/12/2021   Coronary artery disease of native artery of native heart with stable angina pectoris 08/14/2020   Aortic atherosclerosis 08/14/2020   Atrophy of kidney 10/10/2019   Edema of lower extremity 10/10/2019   Thrombocytopenia 08/13/2019   Type 2 diabetes mellitus with obesity 08/13/2019   Hyperlipidemia associated with type 2 diabetes mellitus (HCC) 08/13/2019   Neuropathy involving both lower extremities 05/04/2019   Polyneuropathy, unspecified 05/04/2019   Multiple renal cysts 04/09/2019   Stable angina 12/29/2018   Onychomycosis of multiple toenails with type 2 diabetes mellitus (HCC) 11/30/2017   Chronic heart failure with preserved ejection fraction (HCC) 10/20/2017   OSA on CPAP 02/10/2017   Abnormal ankle brachial index (ABI) 11/26/2015   Stage 3a chronic kidney disease (HCC) 08/15/2015   H/O: upper GI bleed    Hypothyroidism due to acquired atrophy of thyroid  03/11/2015   History of prostatitis 11/06/2014   Hypertension associated with type 2 diabetes mellitus (HCC) 11/06/2014   Mixed hyperlipidemia 11/06/2014   Flat feet, bilateral 11/06/2014   Chronic multiple gastric erosions 11/06/2014   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) 11/06/2014    Past Surgical History:  Procedure Laterality Date   CATARACT EXTRACTION, BILATERAL Bilateral 11/18/2017   COLONOSCOPY N/A 12/27/2013   Procedure: COLONOSCOPY;  Surgeon: Norleen JAYSON Hint, MD;  Location: [facility] ENDOSCOPY;  Service: Endoscopy;  Laterality: N/A;   ENTEROSCOPY N/A 12/21/2013   Procedure: ENTEROSCOPY;  Surgeon: Lynwood LITTIE Celestia Mickey., MD;  Location: [facility] ENDOSCOPY;  Service: Endoscopy;  Laterality: N/A;   ENTEROSCOPY N/A 12/27/2013   Procedure: ENTEROSCOPY;  Surgeon: Norleen JAYSON Hint, MD;  Location: [facility] ENDOSCOPY;  Service: Endoscopy;  Laterality: N/A;     Family History  Problem Relation Age of Onset   Hyperlipidemia Father    Kidney disease Father    Heart disease Father    Congestive Heart Failure Mother    Heart disease Mother    Hyperlipidemia Daughter    Hyperlipidemia Son    Diabetes Son    Anxiety disorder Daughter    Depression Daughter    Arthritis Daughter     Social History   Tobacco Use   Smoking status: Former    Current packs/day: 0.00    Types: Cigarettes, Cigars    Start date: 2    Quit date: 1990    Years since quitting: 35.9   Smokeless tobacco: Never   Tobacco comments:    Smoked afternoon cigar  Substance Use Topics   Alcohol use: No     Current Outpatient Medications:    acetaminophen (TYLENOL) 500 MG tablet, Take 500 mg by mouth every 6 (six) hours as needed., Disp: , Rfl:    albuterol (VENTOLIN HFA) 108 (90 Base) MCG/ACT inhaler, SMARTSIG:2 Puff(s) By Mouth Every 4 Hours PRN, Disp: , Rfl:    allopurinol  (ZYLOPRIM ) 100 MG tablet, Take 100 mg by mouth 2 (two) times daily., Disp: , Rfl:    amLODipine  (NORVASC ) 2.5 MG tablet, Take 1 tablet (2.5 mg total) by mouth daily., Disp: 100 tablet, Rfl: 1   aspirin  EC 81 MG tablet, Take 81 mg by mouth daily., Disp: , Rfl:    carvedilol  (COREG ) 3.125 MG tablet, TAKE 1 TABLET BY MOUTH TWICE  DAILY, Disp: 180 tablet, Rfl: 3   Cholecalciferol (VITAMIN D3) 25 MCG (1000 UT) CAPS, Take 1,000 Units by mouth daily., Disp: , Rfl:    Coenzyme Q10 (CO Q 10) 100 MG CAPS, Take 100 mg by mouth daily., Disp: , Rfl:    famotidine  (PEPCID ) 20 MG tablet, Take 1 tablet (20 mg total) by mouth daily., Disp: 100 tablet, Rfl: 1   glucose blood (IGLUCOSE TEST STRIPS) test strip, Iglucose strips check BG bid E11.65, Disp: 100 each, Rfl: 1   Insulin  Pen Needle (BD PEN NEEDLE NANO U/F) 32G X 4 MM MISC, USE SUBCUTANEOUSLY ONCE DAILY AT 12 NOON, Disp: 100 each, Rfl: 2   levothyroxine  (SYNTHROID ) 25 MCG tablet, Take 1 tablet (25 mcg total) by mouth every morning., Disp: 100 tablet, Rfl:  0   liraglutide  (VICTOZA ) 18 MG/3ML SOPN, Inject 1.8 mg into the skin daily., Disp: 9 mL, Rfl: 3   losartan  (COZAAR ) 25 MG tablet, TAKE ONE-HALF TABLET BY MOUTH  DAILY, Disp: 45 tablet, Rfl: 6   Multiple Vitamins-Minerals (CENTRUM SILVER PO), Take 1 tablet by mouth daily., Disp: , Rfl:    nitroGLYCERIN  (NITROSTAT ) 0.4 MG SL tablet, DISSOLVE 1 TABLET UNDER THE  TONGUE EVERY 5 MINUTES AS NEEDED FOR CHEST PAIN. MAX OF 3 TABLETS IN 15 MINUTES. CALL 911 IF PAIN  PERSISTS., Disp: 25 tablet, Rfl: 0   Omega-3 Fatty Acids (FISH OIL) 1000 MG CAPS, Take 1,000 mg by mouth daily., Disp: , Rfl:    QUEtiapine  (SEROQUEL ) 25 MG tablet, Take 1 tablet (25 mg total) by mouth at bedtime. For sleep in place of trazodone , Disp: 100 tablet, Rfl: 1   rosuvastatin  (CRESTOR ) 20 MG tablet, Take 1 tablet (20 mg total) by mouth daily., Disp: 100 tablet,  Rfl: 1   triamcinolone  cream (KENALOG ) 0.1 %, Apply 1 application topically 2 (two) times daily., Disp: 30 g, Rfl: 0  Allergies  Allergen Reactions   Farxiga  [Dapagliflozin ] Itching   Niacin  And Related Itching and Rash    I personally reviewed active problem list, medication list, allergies, family history with the patient/caregiver today.   ROS  Ten systems reviewed and is negative except as mentioned in HPI    Objective Physical Exam CONSTITUTIONAL: Patient appears well-developed and well-nourished.  No distress. HEENT: Head atraumatic, normocephalic, neck supple. CARDIOVASCULAR: Normal rate, regular rhythm and normal heart sounds.  No murmur heard. No BLE edema. PULMONARY: Effort normal and breath sounds normal. No respiratory distress. ABDOMINAL: There is no tenderness or distention. MUSCULOSKELETAL: Normal gait. Without gross motor or sensory deficit. PSYCHIATRIC: Patient has a normal mood and affect. behavior is normal. Judgment and thought content normal.  Vitals:   01/17/24 1030  BP: 124/74  Pulse: 71  Resp: 16  Temp: 97.8 F (36.6 C)  TempSrc:  Oral  SpO2: 97%  Weight: 218 lb 12.8 oz (99.2 kg)  Height: 5' 8 (1.727 m)    Body mass index is 33.27 kg/m.  Recent Results (from the past 2160 hours)  POCT glycosylated hemoglobin (Hb A1C)     Status: Abnormal   Collection Time: 01/17/24 10:33 AM  Result Value Ref Range   Hemoglobin A1C 5.7 (A) 4.0 - 5.6 %   HbA1c POC (<> result, manual entry)     HbA1c, POC (prediabetic range)     HbA1c, POC (controlled diabetic range)      Diabetic Foot Exam:     PHQ2/9:    01/17/2024   10:22 AM 06/02/2023    8:18 AM 05/16/2023   10:08 AM 01/04/2023   10:43 AM 11/03/2022   11:58 AM  Depression screen PHQ 2/9  Decreased Interest 0 0 0 0 0  Down, Depressed, Hopeless 0 0 0 0 0  PHQ - 2 Score 0 0 0 0 0  Altered sleeping  0 0 0   Tired, decreased energy  0 0 0   Change in appetite  0 0 0   Feeling bad or failure about yourself   0 0 0   Trouble concentrating  0 0 0   Moving slowly or fidgety/restless  0 0 0   Suicidal thoughts  0 0 0   PHQ-9 Score  0  0  0    Difficult doing work/chores  Not difficult at all Not difficult at all       Data saved with a previous flowsheet row definition    phq 9 is negative  Fall Risk:    01/17/2024   10:22 AM 06/02/2023    8:23 AM 05/16/2023    9:58 AM 01/04/2023   10:43 AM 08/30/2022   10:41 AM  Fall Risk   Falls in the past year? 0 1 0 1 0  Number falls in past yr: 0 0 0 0   Injury with Fall? 0 0 0 0   Risk for fall due to : No Fall Risks  No Fall Risks History of fall(s) No Fall Risks  Follow up Falls evaluation completed Falls prevention discussed;Falls evaluation completed Falls prevention discussed;Education provided;Falls evaluation completed Falls prevention discussed;Education provided;Falls evaluation completed Falls prevention discussed      Assessment & Plan Type 2 diabetes mellitus with diabetic chronic kidney disease, polyneuropathy, obesity, hypertension, and dyslipidemia Diabetes well-controlled with Victoza . Peripheral  neuropathy symptoms improved. Dyslipidemia  managed with rosuvastatin . Hypertension well-controlled with current medications. - Continue Victoza  daily. - Continue rosuvastatin . - Continue losartan , carvedilol , and Norvasc . - Obtain eye exam results.  Congestive heart failure with preserved ejection fraction Condition well-managed with beta blocker and ARB. - Continue current medications.  Angina pectoris Intermittent chest pain with activity, no need for nitroglycerin . - Avoid activities that exacerbate chest pain. - under the care of cardiologist  Obstructive sleep apnea Well-managed with CPAP. - Continue CPAP therapy. - sees Dr. Isaiah  Acquired hypothyroidism Well-managed with levothyroxine  25 mcg daily. - Continue levothyroxine  25 mcg daily.  Gastroesophageal reflux disease Well-controlled with famotidine  20 mg daily. - Continue famotidine  20 mg daily.  Insomnia Variable sleep patterns, advised to reduce caffeine and avoid worrying at night. - Continue Seroquel  25 mg. - Reduce caffeine intake in the afternoon. - Avoid worrying at night.  Elevated liver enzymes Slightly elevated, likely related to congestive heart failure, monitoring chosen over extensive testing. - Monitor liver enzymes.

## 2024-01-18 ENCOUNTER — Ambulatory Visit: Payer: Self-pay | Admitting: Family Medicine

## 2024-01-18 LAB — MICROALBUMIN / CREATININE URINE RATIO
Creatinine, Urine: 106 mg/dL (ref 20–320)
Microalb Creat Ratio: 26 mg/g{creat} (ref <30)
Microalb, Ur: 2.8 mg/dL

## 2024-01-18 LAB — COMPREHENSIVE METABOLIC PANEL WITH GFR
AG Ratio: 1.9 (calc) (ref 1.0–2.5)
ALT: 37 U/L (ref 9–46)
AST: 31 U/L (ref 10–35)
Albumin: 4.7 g/dL (ref 3.6–5.1)
Alkaline phosphatase (APISO): 74 U/L (ref 35–144)
BUN/Creatinine Ratio: 13 (calc) (ref 6–22)
BUN: 17 mg/dL (ref 7–25)
CO2: 21 mmol/L (ref 20–32)
Calcium: 9.6 mg/dL (ref 8.6–10.3)
Chloride: 106 mmol/L (ref 98–110)
Creat: 1.29 mg/dL — ABNORMAL HIGH (ref 0.70–1.22)
Globulin: 2.5 g/dL (ref 1.9–3.7)
Glucose, Bld: 98 mg/dL (ref 65–99)
Potassium: 4.4 mmol/L (ref 3.5–5.3)
Sodium: 139 mmol/L (ref 135–146)
Total Bilirubin: 1.1 mg/dL (ref 0.2–1.2)
Total Protein: 7.2 g/dL (ref 6.1–8.1)
eGFR: 55 mL/min/1.73m2 — ABNORMAL LOW (ref >=60)

## 2024-01-29 ENCOUNTER — Other Ambulatory Visit: Payer: Self-pay | Admitting: Family Medicine

## 2024-01-29 ENCOUNTER — Other Ambulatory Visit: Payer: Self-pay | Admitting: Internal Medicine

## 2024-01-29 DIAGNOSIS — E034 Atrophy of thyroid (acquired): Secondary | ICD-10-CM

## 2024-01-31 NOTE — Telephone Encounter (Signed)
 Requested Prescriptions  Pending Prescriptions Disp Refills   levothyroxine  (SYNTHROID ) 25 MCG tablet [Pharmacy Med Name: Levothyroxine  Sodium 25 MCG Oral Tablet] 100 tablet 1    Sig: TAKE 1 TABLET BY MOUTH EVERY  MORNING     Endocrinology:  Hypothyroid Agents Passed - 01/31/2024  3:32 PM      Passed - TSH in normal range and within 360 days    TSH  Date Value Ref Range Status  09/15/2023 3.09 0.40 - 4.50 mIU/L Final         Passed - Valid encounter within last 12 months    Recent Outpatient Visits           2 weeks ago Type 2 diabetes mellitus with stage 3a chronic kidney disease and hypertension (HCC)   [facility] [place], [place], MD   4 months ago Type 2 diabetes mellitus with stage 3a chronic kidney disease, without long-term current use of insulin  [facility])   [place] [facility] [place] Dorette, MD   8 months ago Type 2 diabetes mellitus with stage 3a chronic kidney disease, without long-term current use of insulin  [facility])   [place] [facility] [place] D[place]te, MD

## 2024-03-05 ENCOUNTER — Other Ambulatory Visit: Payer: Self-pay | Admitting: Internal Medicine

## 2024-04-20 ENCOUNTER — Ambulatory Visit: Admitting: Internal Medicine

## 2024-05-16 ENCOUNTER — Ambulatory Visit: Admitting: Family Medicine

## 2024-06-07 ENCOUNTER — Ambulatory Visit
